# Patient Record
Sex: Female | Born: 1935
Health system: Southern US, Community
[De-identification: ages and names within clinical notes are randomized; demographics above are authoritative.]

## PROBLEM LIST (undated history)

## (undated) DIAGNOSIS — I219 Acute myocardial infarction, unspecified: Secondary | ICD-10-CM

## (undated) DIAGNOSIS — E785 Hyperlipidemia, unspecified: Secondary | ICD-10-CM

## (undated) DIAGNOSIS — E559 Vitamin D deficiency, unspecified: Secondary | ICD-10-CM

## (undated) DIAGNOSIS — I2510 Atherosclerotic heart disease of native coronary artery without angina pectoris: Secondary | ICD-10-CM

## (undated) DIAGNOSIS — I252 Old myocardial infarction: Secondary | ICD-10-CM

## (undated) DIAGNOSIS — C8590 Non-Hodgkin lymphoma, unspecified, unspecified site: Secondary | ICD-10-CM

## (undated) HISTORY — DX: Vitamin D deficiency, unspecified: E55.9

## (undated) HISTORY — DX: Acute myocardial infarction, unspecified: I21.9

## (undated) HISTORY — DX: Non-Hodgkin lymphoma, unspecified, unspecified site: C85.90

---
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
## 1998-01-16 ENCOUNTER — Ambulatory Visit (HOSPITAL_COMMUNITY): Admission: RE | Admit: 1998-01-16 | Discharge: 1998-01-16 | Payer: Self-pay

## 1998-03-07 ENCOUNTER — Ambulatory Visit (HOSPITAL_BASED_OUTPATIENT_CLINIC_OR_DEPARTMENT_OTHER): Admission: RE | Admit: 1998-03-07 | Discharge: 1998-03-07 | Payer: Self-pay | Admitting: Otolaryngology

## 1998-04-13 ENCOUNTER — Ambulatory Visit (HOSPITAL_COMMUNITY): Admission: RE | Admit: 1998-04-13 | Discharge: 1998-04-13 | Payer: Self-pay

## 1998-04-16 ENCOUNTER — Ambulatory Visit (HOSPITAL_COMMUNITY): Admission: RE | Admit: 1998-04-16 | Discharge: 1998-04-16 | Payer: Self-pay | Admitting: Hematology and Oncology

## 1999-04-16 ENCOUNTER — Ambulatory Visit (HOSPITAL_COMMUNITY): Admission: RE | Admit: 1999-04-16 | Discharge: 1999-04-16 | Payer: Self-pay | Admitting: Hematology and Oncology

## 1999-04-16 ENCOUNTER — Encounter: Payer: Self-pay | Admitting: Hematology and Oncology

## 1999-10-01 ENCOUNTER — Other Ambulatory Visit: Admission: RE | Admit: 1999-10-01 | Discharge: 1999-10-01 | Payer: Self-pay | Admitting: Internal Medicine

## 1999-10-01 ENCOUNTER — Encounter (INDEPENDENT_AMBULATORY_CARE_PROVIDER_SITE_OTHER): Payer: Self-pay

## 1999-10-15 ENCOUNTER — Encounter: Admission: RE | Admit: 1999-10-15 | Discharge: 1999-10-15 | Payer: Self-pay | Admitting: Hematology and Oncology

## 1999-10-15 ENCOUNTER — Encounter: Payer: Self-pay | Admitting: Hematology and Oncology

## 1999-12-12 ENCOUNTER — Encounter: Admission: RE | Admit: 1999-12-12 | Discharge: 1999-12-12 | Payer: Self-pay | Admitting: Hematology and Oncology

## 1999-12-12 ENCOUNTER — Encounter: Payer: Self-pay | Admitting: Hematology and Oncology

## 2000-01-17 ENCOUNTER — Emergency Department (HOSPITAL_COMMUNITY): Admission: EM | Admit: 2000-01-17 | Discharge: 2000-01-17 | Payer: Self-pay | Admitting: Emergency Medicine

## 2000-01-27 ENCOUNTER — Emergency Department (HOSPITAL_COMMUNITY): Admission: EM | Admit: 2000-01-27 | Discharge: 2000-01-27 | Payer: Self-pay | Admitting: Emergency Medicine

## 2000-03-04 ENCOUNTER — Encounter: Payer: Self-pay | Admitting: Hematology and Oncology

## 2000-03-04 ENCOUNTER — Encounter: Admission: RE | Admit: 2000-03-04 | Discharge: 2000-03-04 | Payer: Self-pay | Admitting: Hematology and Oncology

## 2000-04-17 ENCOUNTER — Ambulatory Visit (HOSPITAL_COMMUNITY): Admission: RE | Admit: 2000-04-17 | Discharge: 2000-04-17 | Payer: Self-pay

## 2000-10-28 ENCOUNTER — Encounter: Payer: Self-pay | Admitting: *Deleted

## 2000-10-28 ENCOUNTER — Emergency Department (HOSPITAL_COMMUNITY): Admission: EM | Admit: 2000-10-28 | Discharge: 2000-10-28 | Payer: Self-pay | Admitting: *Deleted

## 2001-03-02 ENCOUNTER — Encounter: Admission: RE | Admit: 2001-03-02 | Discharge: 2001-03-02 | Payer: Self-pay | Admitting: Hematology and Oncology

## 2001-03-02 ENCOUNTER — Encounter: Payer: Self-pay | Admitting: Hematology and Oncology

## 2001-05-04 ENCOUNTER — Ambulatory Visit (HOSPITAL_COMMUNITY): Admission: RE | Admit: 2001-05-04 | Discharge: 2001-05-04 | Payer: Self-pay

## 2001-06-15 ENCOUNTER — Other Ambulatory Visit: Admission: RE | Admit: 2001-06-15 | Discharge: 2001-06-15 | Payer: Self-pay | Admitting: Obstetrics and Gynecology

## 2001-07-15 ENCOUNTER — Ambulatory Visit (HOSPITAL_COMMUNITY): Admission: RE | Admit: 2001-07-15 | Discharge: 2001-07-15 | Payer: Self-pay | Admitting: Obstetrics and Gynecology

## 2001-07-15 ENCOUNTER — Encounter (INDEPENDENT_AMBULATORY_CARE_PROVIDER_SITE_OTHER): Payer: Self-pay | Admitting: Specialist

## 2001-11-10 ENCOUNTER — Ambulatory Visit (HOSPITAL_COMMUNITY): Admission: RE | Admit: 2001-11-10 | Discharge: 2001-11-10 | Payer: Self-pay

## 2002-03-15 ENCOUNTER — Ambulatory Visit (HOSPITAL_COMMUNITY): Admission: RE | Admit: 2002-03-15 | Discharge: 2002-03-15 | Payer: Self-pay | Admitting: Critical Care Medicine

## 2002-03-15 ENCOUNTER — Encounter: Payer: Self-pay | Admitting: Critical Care Medicine

## 2002-05-12 ENCOUNTER — Encounter: Payer: Self-pay | Admitting: Hematology and Oncology

## 2002-05-12 ENCOUNTER — Ambulatory Visit (HOSPITAL_COMMUNITY): Admission: RE | Admit: 2002-05-12 | Discharge: 2002-05-12 | Payer: Self-pay | Admitting: Hematology and Oncology

## 2002-07-06 ENCOUNTER — Emergency Department (HOSPITAL_COMMUNITY): Admission: EM | Admit: 2002-07-06 | Discharge: 2002-07-06 | Payer: Self-pay | Admitting: Emergency Medicine

## 2002-07-28 ENCOUNTER — Encounter: Payer: Self-pay | Admitting: Hematology & Oncology

## 2002-07-28 ENCOUNTER — Encounter: Admission: RE | Admit: 2002-07-28 | Discharge: 2002-07-28 | Payer: Self-pay | Admitting: Hematology & Oncology

## 2002-09-29 ENCOUNTER — Other Ambulatory Visit: Admission: RE | Admit: 2002-09-29 | Discharge: 2002-09-29 | Payer: Self-pay | Admitting: Obstetrics and Gynecology

## 2003-01-25 ENCOUNTER — Encounter: Payer: Self-pay | Admitting: Hematology & Oncology

## 2003-01-25 ENCOUNTER — Encounter: Admission: RE | Admit: 2003-01-25 | Discharge: 2003-01-25 | Payer: Self-pay | Admitting: Hematology & Oncology

## 2003-05-18 ENCOUNTER — Ambulatory Visit (HOSPITAL_COMMUNITY): Admission: RE | Admit: 2003-05-18 | Discharge: 2003-05-18 | Payer: Self-pay

## 2003-10-10 ENCOUNTER — Other Ambulatory Visit: Admission: RE | Admit: 2003-10-10 | Discharge: 2003-10-10 | Payer: Self-pay | Admitting: Obstetrics and Gynecology

## 2004-06-03 ENCOUNTER — Ambulatory Visit (HOSPITAL_COMMUNITY): Admission: RE | Admit: 2004-06-03 | Discharge: 2004-06-03 | Payer: Self-pay

## 2004-08-09 ENCOUNTER — Ambulatory Visit: Payer: Self-pay | Admitting: Hematology & Oncology

## 2005-01-29 ENCOUNTER — Ambulatory Visit: Payer: Self-pay | Admitting: Hematology & Oncology

## 2005-02-17 ENCOUNTER — Encounter: Admission: RE | Admit: 2005-02-17 | Discharge: 2005-02-17 | Payer: Self-pay | Admitting: Hematology & Oncology

## 2005-04-07 ENCOUNTER — Ambulatory Visit: Payer: Self-pay | Admitting: Internal Medicine

## 2005-04-14 ENCOUNTER — Ambulatory Visit (HOSPITAL_COMMUNITY): Admission: RE | Admit: 2005-04-14 | Discharge: 2005-04-14 | Payer: Self-pay | Admitting: Internal Medicine

## 2005-04-14 ENCOUNTER — Ambulatory Visit: Payer: Self-pay | Admitting: Internal Medicine

## 2005-04-14 ENCOUNTER — Encounter (INDEPENDENT_AMBULATORY_CARE_PROVIDER_SITE_OTHER): Payer: Self-pay | Admitting: Specialist

## 2005-04-15 ENCOUNTER — Ambulatory Visit: Payer: Self-pay | Admitting: Gastroenterology

## 2005-04-18 ENCOUNTER — Ambulatory Visit (HOSPITAL_COMMUNITY): Admission: RE | Admit: 2005-04-18 | Discharge: 2005-04-18 | Payer: Self-pay | Admitting: Gastroenterology

## 2005-04-18 ENCOUNTER — Encounter (INDEPENDENT_AMBULATORY_CARE_PROVIDER_SITE_OTHER): Payer: Self-pay | Admitting: Specialist

## 2005-05-01 ENCOUNTER — Ambulatory Visit: Payer: Self-pay | Admitting: Hematology & Oncology

## 2005-05-05 ENCOUNTER — Ambulatory Visit (HOSPITAL_COMMUNITY): Admission: RE | Admit: 2005-05-05 | Discharge: 2005-05-05 | Payer: Self-pay | Admitting: Hematology & Oncology

## 2005-05-13 ENCOUNTER — Ambulatory Visit: Admission: RE | Admit: 2005-05-13 | Discharge: 2005-06-20 | Payer: Self-pay | Admitting: Radiation Oncology

## 2005-07-14 ENCOUNTER — Ambulatory Visit: Payer: Self-pay | Admitting: Hematology & Oncology

## 2005-08-21 ENCOUNTER — Ambulatory Visit (HOSPITAL_COMMUNITY): Admission: RE | Admit: 2005-08-21 | Discharge: 2005-08-21 | Payer: Self-pay

## 2005-10-13 ENCOUNTER — Ambulatory Visit (HOSPITAL_COMMUNITY): Admission: RE | Admit: 2005-10-13 | Discharge: 2005-10-13 | Payer: Self-pay | Admitting: Hematology & Oncology

## 2005-10-17 ENCOUNTER — Ambulatory Visit: Payer: Self-pay | Admitting: Hematology & Oncology

## 2005-12-22 ENCOUNTER — Other Ambulatory Visit: Admission: RE | Admit: 2005-12-22 | Discharge: 2005-12-22 | Payer: Self-pay | Admitting: Obstetrics and Gynecology

## 2006-01-20 ENCOUNTER — Ambulatory Visit (HOSPITAL_COMMUNITY): Admission: RE | Admit: 2006-01-20 | Discharge: 2006-01-20 | Payer: Self-pay | Admitting: Hematology & Oncology

## 2006-01-22 ENCOUNTER — Ambulatory Visit: Payer: Self-pay | Admitting: Hematology & Oncology

## 2006-01-26 LAB — CBC WITH DIFFERENTIAL/PLATELET
BASO%: 0.6 % (ref 0.0–2.0)
Basophils Absolute: 0 10*3/uL (ref 0.0–0.1)
EOS%: 4.5 % (ref 0.0–7.0)
Eosinophils Absolute: 0.2 10*3/uL (ref 0.0–0.5)
HCT: 37.5 % (ref 34.8–46.6)
HGB: 12.9 g/dL (ref 11.6–15.9)
LYMPH%: 23.4 % (ref 14.0–48.0)
MCH: 31.6 pg (ref 26.0–34.0)
MCHC: 34.5 g/dL (ref 32.0–36.0)
MCV: 91.5 fL (ref 81.0–101.0)
MONO#: 0.5 10*3/uL (ref 0.1–0.9)
MONO%: 10.1 % (ref 0.0–13.0)
NEUT#: 3.1 10*3/uL (ref 1.5–6.5)
NEUT%: 61.4 % (ref 39.6–76.8)
Platelets: 229 10*3/uL (ref 145–400)
RBC: 4.1 10*6/uL (ref 3.70–5.32)
RDW: 13.1 % (ref 11.3–14.5)
WBC: 5.1 10*3/uL (ref 3.9–10.0)
lymph#: 1.2 10*3/uL (ref 0.9–3.3)

## 2006-01-26 LAB — LACTATE DEHYDROGENASE: LDH: 137 U/L (ref 94–250)

## 2006-03-09 ENCOUNTER — Ambulatory Visit: Payer: Self-pay | Admitting: Hematology & Oncology

## 2006-04-23 ENCOUNTER — Encounter: Admission: RE | Admit: 2006-04-23 | Discharge: 2006-04-23 | Payer: Self-pay | Admitting: Orthopaedic Surgery

## 2006-04-27 ENCOUNTER — Ambulatory Visit: Payer: Self-pay | Admitting: Hematology & Oncology

## 2006-04-27 LAB — CBC WITH DIFFERENTIAL/PLATELET
BASO%: 0.7 % (ref 0.0–2.0)
Basophils Absolute: 0 10*3/uL (ref 0.0–0.1)
EOS%: 4.5 % (ref 0.0–7.0)
Eosinophils Absolute: 0.2 10*3/uL (ref 0.0–0.5)
HCT: 34.5 % — ABNORMAL LOW (ref 34.8–46.6)
HGB: 11.9 g/dL (ref 11.6–15.9)
LYMPH%: 28.5 % (ref 14.0–48.0)
MCH: 31.5 pg (ref 26.0–34.0)
MCHC: 34.4 g/dL (ref 32.0–36.0)
MCV: 91.7 fL (ref 81.0–101.0)
MONO#: 0.4 10*3/uL (ref 0.1–0.9)
MONO%: 11.6 % (ref 0.0–13.0)
NEUT#: 2 10*3/uL (ref 1.5–6.5)
NEUT%: 54.7 % (ref 39.6–76.8)
Platelets: 207 10*3/uL (ref 145–400)
RBC: 3.77 10*6/uL (ref 3.70–5.32)
RDW: 13.6 % (ref 11.3–14.5)
WBC: 3.6 10*3/uL — ABNORMAL LOW (ref 3.9–10.0)
lymph#: 1 10*3/uL (ref 0.9–3.3)

## 2006-04-28 LAB — BETA 2 MICROGLOBULIN, SERUM: Beta-2 Microglobulin: 1.17 mg/L (ref 1.01–1.73)

## 2006-04-28 LAB — LACTATE DEHYDROGENASE: LDH: 172 U/L (ref 94–250)

## 2006-05-21 ENCOUNTER — Ambulatory Visit (HOSPITAL_COMMUNITY): Admission: RE | Admit: 2006-05-21 | Discharge: 2006-05-21 | Payer: Self-pay | Admitting: Hematology & Oncology

## 2006-06-01 ENCOUNTER — Encounter: Admission: RE | Admit: 2006-06-01 | Discharge: 2006-06-01 | Payer: Self-pay | Admitting: Neurosurgery

## 2006-08-24 ENCOUNTER — Ambulatory Visit (HOSPITAL_COMMUNITY): Admission: RE | Admit: 2006-08-24 | Discharge: 2006-08-24 | Payer: Self-pay

## 2006-08-26 ENCOUNTER — Encounter: Admission: RE | Admit: 2006-08-26 | Discharge: 2006-08-26 | Payer: Self-pay | Admitting: Orthopaedic Surgery

## 2006-10-01 ENCOUNTER — Ambulatory Visit (HOSPITAL_COMMUNITY): Admission: RE | Admit: 2006-10-01 | Discharge: 2006-10-01 | Payer: Self-pay | Admitting: Hematology & Oncology

## 2006-10-01 ENCOUNTER — Ambulatory Visit: Payer: Self-pay | Admitting: Hematology & Oncology

## 2006-10-05 LAB — CBC WITH DIFFERENTIAL/PLATELET
BASO%: 0.4 % (ref 0.0–2.0)
Basophils Absolute: 0 10*3/uL (ref 0.0–0.1)
EOS%: 2.5 % (ref 0.0–7.0)
Eosinophils Absolute: 0.1 10*3/uL (ref 0.0–0.5)
HCT: 36.1 % (ref 34.8–46.6)
HGB: 12.3 g/dL (ref 11.6–15.9)
LYMPH%: 24.1 % (ref 14.0–48.0)
MCH: 31.9 pg (ref 26.0–34.0)
MCHC: 34 g/dL (ref 32.0–36.0)
MCV: 93.7 fL (ref 81.0–101.0)
MONO#: 0.5 10*3/uL (ref 0.1–0.9)
MONO%: 11.9 % (ref 0.0–13.0)
NEUT#: 2.8 10*3/uL (ref 1.5–6.5)
NEUT%: 61.1 % (ref 39.6–76.8)
Platelets: 186 10*3/uL (ref 145–400)
RBC: 3.85 10*6/uL (ref 3.70–5.32)
RDW: 13.3 % (ref 11.3–14.5)
WBC: 4.6 10*3/uL (ref 3.9–10.0)
lymph#: 1.1 10*3/uL (ref 0.9–3.3)

## 2006-10-05 LAB — LACTATE DEHYDROGENASE: LDH: 165 U/L (ref 94–250)

## 2007-01-07 ENCOUNTER — Encounter: Admission: RE | Admit: 2007-01-07 | Discharge: 2007-01-07 | Payer: Self-pay | Admitting: Orthopaedic Surgery

## 2007-01-27 ENCOUNTER — Ambulatory Visit (HOSPITAL_COMMUNITY): Admission: RE | Admit: 2007-01-27 | Discharge: 2007-01-27 | Payer: Self-pay | Admitting: Hematology & Oncology

## 2007-01-29 ENCOUNTER — Ambulatory Visit: Payer: Self-pay | Admitting: Hematology & Oncology

## 2007-03-15 ENCOUNTER — Ambulatory Visit (HOSPITAL_COMMUNITY): Admission: RE | Admit: 2007-03-15 | Discharge: 2007-03-15 | Payer: Self-pay | Admitting: Gastroenterology

## 2007-03-15 ENCOUNTER — Encounter (INDEPENDENT_AMBULATORY_CARE_PROVIDER_SITE_OTHER): Payer: Self-pay | Admitting: Gastroenterology

## 2007-03-18 ENCOUNTER — Ambulatory Visit (HOSPITAL_COMMUNITY): Admission: RE | Admit: 2007-03-18 | Discharge: 2007-03-18 | Payer: Self-pay

## 2007-03-30 ENCOUNTER — Encounter: Admission: RE | Admit: 2007-03-30 | Discharge: 2007-03-30 | Payer: Self-pay | Admitting: Orthopaedic Surgery

## 2007-08-01 ENCOUNTER — Ambulatory Visit: Payer: Self-pay | Admitting: Hematology & Oncology

## 2007-08-04 LAB — COMPREHENSIVE METABOLIC PANEL
ALT: 11 U/L (ref 0–35)
AST: 18 U/L (ref 0–37)
Albumin: 4 g/dL (ref 3.5–5.2)
Alkaline Phosphatase: 69 U/L (ref 39–117)
BUN: 17 mg/dL (ref 6–23)
CO2: 27 mEq/L (ref 19–32)
Calcium: 9.5 mg/dL (ref 8.4–10.5)
Chloride: 107 mEq/L (ref 96–112)
Creatinine, Ser: 0.76 mg/dL (ref 0.40–1.20)
Glucose, Bld: 86 mg/dL (ref 70–99)
Potassium: 4.6 mEq/L (ref 3.5–5.3)
Sodium: 144 mEq/L (ref 135–145)
Total Bilirubin: 0.4 mg/dL (ref 0.3–1.2)
Total Protein: 6.3 g/dL (ref 6.0–8.3)

## 2007-08-04 LAB — CBC WITH DIFFERENTIAL/PLATELET
BASO%: 0.4 % (ref 0.0–2.0)
Basophils Absolute: 0 10*3/uL (ref 0.0–0.1)
EOS%: 2.4 % (ref 0.0–7.0)
Eosinophils Absolute: 0.1 10*3/uL (ref 0.0–0.5)
HCT: 35.8 % (ref 34.8–46.6)
HGB: 12.6 g/dL (ref 11.6–15.9)
LYMPH%: 23.2 % (ref 14.0–48.0)
MCH: 32.3 pg (ref 26.0–34.0)
MCHC: 35.1 g/dL (ref 32.0–36.0)
MCV: 91.9 fL (ref 81.0–101.0)
MONO#: 0.6 10*3/uL (ref 0.1–0.9)
MONO%: 13.3 % — ABNORMAL HIGH (ref 0.0–13.0)
NEUT#: 2.7 10*3/uL (ref 1.5–6.5)
NEUT%: 60.7 % (ref 39.6–76.8)
Platelets: 194 10*3/uL (ref 145–400)
RBC: 3.9 10*6/uL (ref 3.70–5.32)
RDW: 13 % (ref 11.3–14.5)
WBC: 4.5 10*3/uL (ref 3.9–10.0)
lymph#: 1 10*3/uL (ref 0.9–3.3)

## 2007-08-04 LAB — LACTATE DEHYDROGENASE: LDH: 147 U/L (ref 94–250)

## 2007-08-25 ENCOUNTER — Ambulatory Visit (HOSPITAL_COMMUNITY): Admission: RE | Admit: 2007-08-25 | Discharge: 2007-08-25 | Payer: Self-pay

## 2007-11-16 ENCOUNTER — Ambulatory Visit (HOSPITAL_COMMUNITY): Admission: RE | Admit: 2007-11-16 | Discharge: 2007-11-16 | Payer: Self-pay | Admitting: Hematology & Oncology

## 2007-12-21 ENCOUNTER — Ambulatory Visit: Payer: Self-pay | Admitting: Hematology & Oncology

## 2007-12-23 LAB — CBC WITH DIFFERENTIAL/PLATELET
BASO%: 0.9 % (ref 0.0–2.0)
Basophils Absolute: 0 10*3/uL (ref 0.0–0.1)
EOS%: 2.7 % (ref 0.0–7.0)
Eosinophils Absolute: 0.1 10*3/uL (ref 0.0–0.5)
HCT: 36 % (ref 34.8–46.6)
HGB: 12.6 g/dL (ref 11.6–15.9)
LYMPH%: 26.6 % (ref 14.0–48.0)
MCH: 31.8 pg (ref 26.0–34.0)
MCHC: 35.2 g/dL (ref 32.0–36.0)
MCV: 90.5 fL (ref 81.0–101.0)
MONO#: 0.4 10*3/uL (ref 0.1–0.9)
MONO%: 10.3 % (ref 0.0–13.0)
NEUT#: 2.4 10*3/uL (ref 1.5–6.5)
NEUT%: 59.5 % (ref 39.6–76.8)
Platelets: 208 10*3/uL (ref 145–400)
RBC: 3.98 10*6/uL (ref 3.70–5.32)
RDW: 13.4 % (ref 11.3–14.5)
WBC: 4 10*3/uL (ref 3.9–10.0)
lymph#: 1.1 10*3/uL (ref 0.9–3.3)

## 2007-12-23 LAB — COMPREHENSIVE METABOLIC PANEL
ALT: 9 U/L (ref 0–35)
AST: 14 U/L (ref 0–37)
Albumin: 4.2 g/dL (ref 3.5–5.2)
Alkaline Phosphatase: 70 U/L (ref 39–117)
BUN: 18 mg/dL (ref 6–23)
CO2: 25 mEq/L (ref 19–32)
Calcium: 8.9 mg/dL (ref 8.4–10.5)
Chloride: 107 mEq/L (ref 96–112)
Creatinine, Ser: 0.58 mg/dL (ref 0.40–1.20)
Glucose, Bld: 88 mg/dL (ref 70–99)
Potassium: 4.6 mEq/L (ref 3.5–5.3)
Sodium: 141 mEq/L (ref 135–145)
Total Bilirubin: 0.3 mg/dL (ref 0.3–1.2)
Total Protein: 6.5 g/dL (ref 6.0–8.3)

## 2007-12-23 LAB — LACTATE DEHYDROGENASE: LDH: 140 U/L (ref 94–250)

## 2008-03-20 ENCOUNTER — Encounter: Admission: RE | Admit: 2008-03-20 | Discharge: 2008-03-20 | Payer: Self-pay | Admitting: Otolaryngology

## 2008-04-17 ENCOUNTER — Ambulatory Visit (HOSPITAL_COMMUNITY): Admission: RE | Admit: 2008-04-17 | Discharge: 2008-04-17 | Payer: Self-pay

## 2008-05-31 ENCOUNTER — Ambulatory Visit: Payer: Self-pay | Admitting: Hematology & Oncology

## 2008-06-01 LAB — CBC WITH DIFFERENTIAL (CANCER CENTER ONLY)
BASO#: 0 10*3/uL (ref 0.0–0.2)
BASO%: 0.6 % (ref 0.0–2.0)
EOS%: 2.5 % (ref 0.0–7.0)
Eosinophils Absolute: 0.1 10*3/uL (ref 0.0–0.5)
HCT: 34.5 % — ABNORMAL LOW (ref 34.8–46.6)
HGB: 11.9 g/dL (ref 11.6–15.9)
LYMPH#: 1.3 10*3/uL (ref 0.9–3.3)
LYMPH%: 32.7 % (ref 14.0–48.0)
MCH: 30.7 pg (ref 26.0–34.0)
MCHC: 34.5 g/dL (ref 32.0–36.0)
MCV: 89 fL (ref 81–101)
MONO#: 0.3 10*3/uL (ref 0.1–0.9)
MONO%: 6.2 % (ref 0.0–13.0)
NEUT#: 2.4 10*3/uL (ref 1.5–6.5)
NEUT%: 58 % (ref 39.6–80.0)
Platelets: 208 10*3/uL (ref 145–400)
RBC: 3.87 10*6/uL (ref 3.70–5.32)
RDW: 11.6 % (ref 10.5–14.6)
WBC: 4.1 10*3/uL (ref 3.9–10.0)

## 2008-06-01 LAB — COMPREHENSIVE METABOLIC PANEL
ALT: 9 U/L (ref 0–35)
AST: 16 U/L (ref 0–37)
Albumin: 4.2 g/dL (ref 3.5–5.2)
Alkaline Phosphatase: 58 U/L (ref 39–117)
BUN: 12 mg/dL (ref 6–23)
CO2: 26 mEq/L (ref 19–32)
Calcium: 8.8 mg/dL (ref 8.4–10.5)
Chloride: 107 mEq/L (ref 96–112)
Creatinine, Ser: 0.63 mg/dL (ref 0.40–1.20)
Glucose, Bld: 85 mg/dL (ref 70–99)
Potassium: 3.8 mEq/L (ref 3.5–5.3)
Sodium: 140 mEq/L (ref 135–145)
Total Bilirubin: 0.6 mg/dL (ref 0.3–1.2)
Total Protein: 6.6 g/dL (ref 6.0–8.3)

## 2008-06-01 LAB — LACTATE DEHYDROGENASE: LDH: 164 U/L (ref 94–250)

## 2008-10-31 ENCOUNTER — Ambulatory Visit (HOSPITAL_COMMUNITY): Admission: RE | Admit: 2008-10-31 | Discharge: 2008-10-31 | Payer: Self-pay | Admitting: Hematology & Oncology

## 2008-11-27 ENCOUNTER — Ambulatory Visit: Payer: Self-pay | Admitting: Hematology & Oncology

## 2009-01-22 ENCOUNTER — Ambulatory Visit (HOSPITAL_COMMUNITY): Admission: RE | Admit: 2009-01-22 | Discharge: 2009-01-22 | Payer: Self-pay

## 2009-05-09 ENCOUNTER — Ambulatory Visit: Payer: Self-pay | Admitting: Hematology & Oncology

## 2009-07-09 ENCOUNTER — Ambulatory Visit: Payer: Self-pay | Admitting: Hematology & Oncology

## 2009-11-06 ENCOUNTER — Ambulatory Visit: Payer: Self-pay | Admitting: Oncology

## 2010-01-17 ENCOUNTER — Ambulatory Visit: Payer: Self-pay | Admitting: Hematology & Oncology

## 2010-01-18 LAB — COMPREHENSIVE METABOLIC PANEL
ALT: 11 U/L (ref 0–35)
AST: 18 U/L (ref 0–37)
Albumin: 4.1 g/dL (ref 3.5–5.2)
Alkaline Phosphatase: 49 U/L (ref 39–117)
BUN: 21 mg/dL (ref 6–23)
CO2: 24 mEq/L (ref 19–32)
Calcium: 8.8 mg/dL (ref 8.4–10.5)
Chloride: 105 mEq/L (ref 96–112)
Creatinine, Ser: 0.68 mg/dL (ref 0.40–1.20)
Glucose, Bld: 73 mg/dL (ref 70–99)
Potassium: 3.8 mEq/L (ref 3.5–5.3)
Sodium: 140 mEq/L (ref 135–145)
Total Bilirubin: 0.5 mg/dL (ref 0.3–1.2)
Total Protein: 6.5 g/dL (ref 6.0–8.3)

## 2010-01-18 LAB — CBC WITH DIFFERENTIAL (CANCER CENTER ONLY)
BASO#: 0 10*3/uL (ref 0.0–0.2)
BASO%: 0.6 % (ref 0.0–2.0)
EOS%: 2.8 % (ref 0.0–7.0)
Eosinophils Absolute: 0.1 10*3/uL (ref 0.0–0.5)
HCT: 37.7 % (ref 34.8–46.6)
HGB: 12.9 g/dL (ref 11.6–15.9)
LYMPH#: 1.3 10*3/uL (ref 0.9–3.3)
LYMPH%: 28.3 % (ref 14.0–48.0)
MCH: 31.7 pg (ref 26.0–34.0)
MCHC: 34.2 g/dL (ref 32.0–36.0)
MCV: 93 fL (ref 81–101)
MONO#: 0.3 10*3/uL (ref 0.1–0.9)
MONO%: 5.9 % (ref 0.0–13.0)
NEUT#: 2.8 10*3/uL (ref 1.5–6.5)
NEUT%: 62.4 % (ref 39.6–80.0)
Platelets: 186 10*3/uL (ref 145–400)
RBC: 4.07 10*6/uL (ref 3.70–5.32)
RDW: 11.4 % (ref 10.5–14.6)
WBC: 4.5 10*3/uL (ref 3.9–10.0)

## 2010-01-18 LAB — VITAMIN D 25 HYDROXY (VIT D DEFICIENCY, FRACTURES): Vit D, 25-Hydroxy: 16 ng/mL — ABNORMAL LOW (ref 30–89)

## 2010-01-18 LAB — FERRITIN: Ferritin: 67 ng/mL (ref 10–291)

## 2010-01-25 ENCOUNTER — Ambulatory Visit (HOSPITAL_COMMUNITY): Admission: RE | Admit: 2010-01-25 | Discharge: 2010-01-25 | Payer: Self-pay | Admitting: Hematology & Oncology

## 2010-02-20 LAB — CONVERTED CEMR LAB: Pap Smear: NORMAL

## 2010-05-30 ENCOUNTER — Ambulatory Visit: Payer: Self-pay | Admitting: Family Medicine

## 2010-05-30 DIAGNOSIS — Z85038 Personal history of other malignant neoplasm of large intestine: Secondary | ICD-10-CM | POA: Insufficient documentation

## 2010-05-30 DIAGNOSIS — M899 Disorder of bone, unspecified: Secondary | ICD-10-CM | POA: Insufficient documentation

## 2010-05-30 DIAGNOSIS — F4321 Adjustment disorder with depressed mood: Secondary | ICD-10-CM | POA: Insufficient documentation

## 2010-05-30 DIAGNOSIS — M949 Disorder of cartilage, unspecified: Secondary | ICD-10-CM

## 2010-05-30 DIAGNOSIS — C8590 Non-Hodgkin lymphoma, unspecified, unspecified site: Secondary | ICD-10-CM | POA: Insufficient documentation

## 2010-05-30 DIAGNOSIS — IMO0001 Reserved for inherently not codable concepts without codable children: Secondary | ICD-10-CM | POA: Insufficient documentation

## 2010-06-03 ENCOUNTER — Encounter: Payer: Self-pay | Admitting: Family Medicine

## 2010-06-03 LAB — CONVERTED CEMR LAB
ALT: 16 units/L (ref 0–35)
AST: 23 units/L (ref 0–37)
Albumin: 4.1 g/dL (ref 3.5–5.2)
Alkaline Phosphatase: 50 units/L (ref 39–117)
BUN: 18 mg/dL (ref 6–23)
Basophils Absolute: 0 10*3/uL (ref 0.0–0.1)
Basophils Relative: 0.5 % (ref 0.0–3.0)
Bilirubin, Direct: 0 mg/dL (ref 0.0–0.3)
CO2: 29 meq/L (ref 19–32)
Calcium: 9.7 mg/dL (ref 8.4–10.5)
Chloride: 102 meq/L (ref 96–112)
Cholesterol: 217 mg/dL — ABNORMAL HIGH (ref 0–200)
Creatinine, Ser: 0.6 mg/dL (ref 0.4–1.2)
Direct LDL: 156.4 mg/dL
Eosinophils Absolute: 0.1 10*3/uL (ref 0.0–0.7)
Eosinophils Relative: 1.7 % (ref 0.0–5.0)
GFR calc non Af Amer: 108.21 mL/min (ref >60)
Glucose, Bld: 85 mg/dL (ref 70–99)
HCT: 37.3 % (ref 36.0–46.0)
HDL: 43.8 mg/dL (ref >39.00)
Hemoglobin: 12.7 g/dL (ref 12.0–15.0)
Lymphocytes Relative: 24.6 % (ref 12.0–46.0)
Lymphs Abs: 1.6 10*3/uL (ref 0.7–4.0)
MCHC: 33.9 g/dL (ref 30.0–36.0)
MCV: 94.7 fL (ref 78.0–100.0)
Monocytes Absolute: 0.6 10*3/uL (ref 0.1–1.0)
Monocytes Relative: 8.6 % (ref 3.0–12.0)
Neutro Abs: 4.3 10*3/uL (ref 1.4–7.7)
Neutrophils Relative %: 64.6 % (ref 43.0–77.0)
Platelets: 189 10*3/uL (ref 150.0–400.0)
Potassium: 4.6 meq/L (ref 3.5–5.1)
RBC: 3.94 M/uL (ref 3.87–5.11)
RDW: 14.1 % (ref 11.5–14.6)
Sodium: 138 meq/L (ref 135–145)
Total Bilirubin: 0.2 mg/dL — ABNORMAL LOW (ref 0.3–1.2)
Total CHOL/HDL Ratio: 5
Total Protein: 6.6 g/dL (ref 6.0–8.3)
Triglycerides: 137 mg/dL (ref 0.0–149.0)
VLDL: 27.4 mg/dL (ref 0.0–40.0)
WBC: 6.7 10*3/uL (ref 4.5–10.5)

## 2010-08-22 ENCOUNTER — Ambulatory Visit: Payer: Self-pay | Admitting: Hematology & Oncology

## 2010-08-23 ENCOUNTER — Ambulatory Visit: Payer: Self-pay | Admitting: Family Medicine

## 2010-08-23 ENCOUNTER — Encounter: Payer: Self-pay | Admitting: Family Medicine

## 2010-08-23 DIAGNOSIS — N3946 Mixed incontinence: Secondary | ICD-10-CM | POA: Insufficient documentation

## 2010-08-23 LAB — CONVERTED CEMR LAB
Bilirubin Urine: NEGATIVE
Blood in Urine, dipstick: NEGATIVE
Glucose, Urine, Semiquant: NEGATIVE
Ketones, urine, test strip: NEGATIVE
Nitrite: NEGATIVE
Specific Gravity, Urine: 1.01
Urobilinogen, UA: 0.2
pH: 5

## 2010-09-05 ENCOUNTER — Ambulatory Visit: Payer: Self-pay | Admitting: Family Medicine

## 2010-09-16 ENCOUNTER — Ambulatory Visit
Admission: RE | Admit: 2010-09-16 | Discharge: 2010-09-16 | Payer: Self-pay | Source: Home / Self Care | Attending: Family Medicine | Admitting: Family Medicine

## 2010-09-16 DIAGNOSIS — R413 Other amnesia: Secondary | ICD-10-CM | POA: Insufficient documentation

## 2010-09-24 ENCOUNTER — Telehealth: Payer: Self-pay | Admitting: Family Medicine

## 2010-09-29 ENCOUNTER — Encounter: Payer: Self-pay | Admitting: Orthopaedic Surgery

## 2010-09-29 ENCOUNTER — Encounter: Payer: Self-pay | Admitting: Hematology & Oncology

## 2010-10-10 NOTE — Progress Notes (Signed)
Summary: motion sickness  Phone Note Call from Patient Call back at [phone]   Caller: Patient Call For: Ruthe Mannan MD Summary of Call: Patient is going on a 7 day cruise. She is asking if she can get patches for motion sickness. Uses [facility]. Initial call taken by: Melody Comas,  September 24, 2010 12:51 PM    New/Updated Medications: TRANSDERM-SCOP 1.5 MG PT72 (SCOPOLAMINE BASE) apply patch behind ear every 72 hours as needed for nausea/mtion sickness at least 4 hours prior to event. Prescriptions: TRANSDERM-SCOP 1.5 MG PT72 (SCOPOLAMINE BASE) apply patch behind ear every 72 hours as needed for nausea/mtion sickness at least 4 hours prior to event.  #5 x 0   Entered and Authorized by:   Ruthe Mannan MD   Signed by:   Ruthe Mannan MD on 09/24/2010   Method used:   Electronically to        [facility]* (retail)       [address]       [place], [place]  [address]       Ph: [phone]       Fax: [phone]   RxID:   [id number]

## 2010-10-10 NOTE — Letter (Signed)
Summary: Generic Letter  [facility]  [address] [place], [place] [address]   Phone: [phone]  Fax: [phone]    06/03/2010  [facility] [address] [place], [place]  [address]  Dear Ms. Italiano,     We have received your lab results.  Dr. Dayton Martes says that your electrolytes and blood count are normal.  Kidney and liver functions are normal as well.  Bad cholesterol (LDL) elevated, otherwise cholesterol looks good.  Decrease added sugar, eliminate trans fat, increase fiber and limit alcohol.  All these changes together can drop cholesterol by almost 50%.  Enclosed you will find a copy of your lab results.        Sincerely,        Linde Gillis CMA (AAMA)for Dr. Ruthe Mannan

## 2010-10-10 NOTE — Assessment & Plan Note (Signed)
Summary: NEW PT TO BE ESTABLISHED/[name]   Vital Signs:  Patient profile:   76 year old female Height:      59.50 inches Weight:      172 pounds BMI:     34.28 Temp:     98.2 degrees F oral Pulse rate:   80 / minute Pulse rhythm:   regular BP sitting:   132 / 80  (left arm) Cuff size:   large  Vitals Entered By: Linde Gillis CMA Duncan Dull) (May 30, 2010 2:16 PM) CC: new patient, establish care  Vision Screening:Left eye w/o correction: 20 / 25 Right Eye w/o correction: 20 / 25 Both eyes w/o correction:  20/ 15  Color vision testing: normal      Vision Entered By: Linde Gillis CMA Duncan Dull) (May 30, 2010 2:33 PM)  Hearing Screen 40db HL: Left  500 hz: 40db 1000 hz: 40db 2000 hz: 40db 4000 hz: 40db Right  500 hz: 40db 1000 hz: 40db 2000 hz: 40db 4000 hz: 40db    History of Present Illness: 76 yo here to establish care.  Awaiting medical records, per pt up to date on all immunizations and preventative services.  H/o Lymphoma- followed by Dr. Myna Hidalgo.  Diagnosed in 1999, s/p chemotherapy.  No further recurrances until had colonoscopy (approx 10 years ago), that showed rectal cancer.  Per pt, told she needed radiation treatment but she refused.  Had a follow up colonoscopy by Dr. Loreta Ave and per pt showed no signs of cancer.  Has been asymptomatic and doing well.  Follows with Dr. Myna Hidalgo yearly.  Husband died a few months ago after long illness s/p massive stroke.  She is doing well emotionally, misses him but knows he "is in a better place." Molly Chandler is extremely active, no h/o falls.  Chops her own wood, helps care for her neighbors. Denies any signs or symptoms of depression.  Leg cramps- has noticed leg cramps for the past few month, usually occur at night.  Happening multiple times per week now.  Does not seem to be worsened or improved by anything.  Current Medications (verified): 1)  Calci-Chew 1250 Mg Chew (Calcium Carbonate) .Marland Kitchen.. 1 Tab By Mouth  Daily. 2)  Vitamin D3 2000 Unit Caps (Cholecalciferol) .Marland Kitchen.. 1 Tab By Mouth Daily.  Allergies (verified): No Known Drug Allergies  Past History:  Family History: Last updated: 05/30/2010 Unremarkable- both parents died of old age.  Social History: Last updated: 05/30/2010 Widow/Widower Alcohol use-no Former smoker Retired 5 children.  Past Medical History: cataracts. Osteopenia Lymphoma, rectal cancer- followed by Dr. Myna Hidalgo  Past Surgical History: Bilateral intraocular lengs, 11/2003, 12/2003- s/p catarct removal.  Family History: Unremarkable- both parents died of old age.  Social History: Widow/Widower Alcohol use-no Former smoker Retired 5 children.  Review of Systems      See HPI General:  Denies malaise, weakness, and weight loss. Eyes:  Denies blurring. ENT:  Denies difficulty swallowing. CV:  Denies chest pain or discomfort, shortness of breath with exertion, swelling of feet, and swelling of hands. Resp:  Denies shortness of breath. GI:  Denies abdominal pain, bloody stools, and change in bowel habits. GU:  Denies discharge and dysuria. MS:  Complains of muscle aches and cramps; denies joint pain, joint redness, and joint swelling. Derm:  Denies rash. Neuro:  Denies headaches and weakness. Psych:  Denies anxiety, depression, easily angered, panic attacks, sense of great danger, suicidal thoughts/plans, thoughts of violence, unusual visions or sounds, and thoughts /plans of harming others. Endo:  Denies cold intolerance and heat intolerance.  Physical Exam  General:  alert, well-developed, and well-nourished.   Head:  normocephalic and atraumatic.   Eyes:  pupils equal and pupils round.   Ears:  R ear normal and L ear normal.   Nose:  no external deformity.   Mouth:  pharynx pink and moist.   Lungs:  Normal respiratory effort, chest expands symmetrically. Lungs are clear to auscultation, no crackles or wheezes. Heart:  Normal rate and regular  rhythm. S1 and S2 normal without gallop, murmur, click, rub or other extra sounds. Abdomen:  Bowel sounds positive,abdomen soft and non-tender without masses, organomegaly or hernias noted. Msk:  normal ROM, no joint tenderness, no joint swelling, no joint warmth, and no redness over joints.   Extremities:  no edema Neurologic:  alert & oriented X3 and gait normal.   Skin:  Intact without suspicious lesions or rashes Psych:  Cognition and judgment appear intact. Alert and cooperative with normal attention span and concentration. No apparent delusions, illusions, hallucinations   Impression & Recommendations:  Problem # 1:  MOURNING (ICD-309.0) Assessment New Appears to be handling things appropriately.  Has a great support system, remaining very active.  Problem # 2:  COLON CANCER, HX OF (ICD-V10.05) Assessment: Unchanged awaitng records from Dr. Myna Hidalgo.  Problem # 3:  LYMPHOMA (ICD-202.80) Assessment: Unchanged In remission per pt.  Awaiting records from Dr. Myna Hidalgo.  Problem # 4:  OSTEOPENIA (ICD-733.90) Assessment: Unchanged Per pt, recent DEXA scan showed improvement per pt. Her updated medication list for this problem includes:    Calci-chew 1250 Mg Chew (Calcium carbonate) .Marland Kitchen... 1 tab by mouth daily.    Vitamin D3 2000 Unit Caps (Cholecalciferol) .Marland Kitchen... 1 tab by mouth daily.  Problem # 5:  UNSPECIFIED MYALGIA AND MYOSITIS (ICD-729.1) Assessment: New Etiology unknown.  Will check BMET, hepatic panel, CBC.  PE exam normal.  No red flag symptoms. Orders: Venipuncture ([address]) TLB-BMP (Basic Metabolic Panel-BMET) (80048-METABOL) TLB-Hepatic/Liver Function Pnl (80076-HEPATIC) TLB-CBC Platelet - w/Differential (85025-CBCD)  Complete Medication List: 1)  Calci-chew 1250 Mg Chew (Calcium carbonate) .Marland Kitchen.. 1 tab by mouth daily. 2)  Vitamin D3 2000 Unit Caps (Cholecalciferol) .Marland Kitchen.. 1 tab by mouth daily.  Other Orders: TLB-Lipid Panel (80061-LIPID)  Prior Medications (reviewed  today): None Current Allergies (reviewed today): No known allergies   Prevention & Chronic Care Immunizations   Influenza vaccine: historical  (05/09/2010)   Influenza vaccine due: 05/10/2011    Tetanus booster: Not documented    Pneumococcal vaccine: Not documented    H. zoster vaccine: Not documented  Colorectal Screening   Hemoccult: Not documented    Colonoscopy: historical  (05/11/2008)   Colonoscopy due: 05/11/2018  Other Screening   Pap smear: historical normal  (02/20/2010)   Pap smear due: 02/21/2011    Mammogram: historical normal  (02/20/2010)   Mammogram due: 02/21/2011    DXA bone density scan: historical- osteopenia  (02/13/2010)   DXA scan due: None    Smoking status: Not documented  Lipids   Total Cholesterol: Not documented   Lipid panel action/deferral: Lipid Panel ordered   LDL: Not documented   LDL Direct: Not documented   HDL: Not documented   Triglycerides: Not documented   Flu Vaccine Result Date:  05/09/2010 Flu Vaccine Result:  historical Colonoscopy Result Date:  05/11/2008 Colonoscopy Result:  historical PAP Result Date:  02/20/2010 PAP Result:  historical normal Mammogram Result Date:  02/20/2010 Mammogram Result:  historical normal Bone Density Result Date:  02/13/2010 Bone Density Result:  historical- osteopenia

## 2010-10-10 NOTE — Assessment & Plan Note (Signed)
Summary: INCONTINANCE/CLE   Vital Signs:  Patient profile:   76 year old female Height:      59.50 inches Weight:      176 pounds BMI:     35.08 Temp:     98.0 degrees F oral Pulse rate:   76 / minute Pulse rhythm:   regular BP sitting:   120 / 70  (left arm) Cuff size:   large  Vitals Entered By: Linde Gillis CMA Duncan Dull) (August 23, 2010 2:46 PM) CC: incontinence   History of Present Illness: 76 yo here for worsening incontinence.  Past two months, cannot make it to the bathroom.  Has had to place a bedside commode next to her bed or she will wet her clothes. Everytime she coughs, sneezes or laughs, she leaks urine as well. G5P5, has never had any gyn surgery. No dysuria, no hematuria.   No nausea or vomiting.  Current Medications (verified): 1)  Calci-Chew 1250 Mg Chew (Calcium Carbonate) .Marland Kitchen.. 1 Tab By Mouth Daily. 2)  Vitamin D3 2000 Unit Caps (Cholecalciferol) .Marland Kitchen.. 1 Tab By Mouth Daily. 3)  Detrol La 2 Mg  Cp24 (Tolterodine Tartrate) .Marland Kitchen.. 1 Tablet By Mouth Daily  Allergies (verified): No Known Drug Allergies  Past History:  Past Surgical History: Last updated: 05/30/2010 Bilateral intraocular lengs, 11/2003, 12/2003- s/p catarct removal.  Review of Systems      See HPI General:  Denies chills and fever. GU:  Complains of incontinence and urinary frequency; denies dysuria and hematuria.  Physical Exam  General:  alert, well-developed, and well-nourished.   VSS, non toxic appearing Mouth:  MMM Abdomen:  Bowel sounds positive,abdomen soft and non-tender without masses, organomegaly or hernias noted. Extremities:  no edema Psych:  Cognition and judgment appear intact. Alert and cooperative with normal attention span and concentration. No apparent delusions, illusions, hallucinations   Impression & Recommendations:  Problem # 1:  URINARY INCONTINENCE, MIXED (ICD-788.33) Assessment New UA pos only for trace LE, this is unlikely infectious. Will send for  urine culture to verify. Will start Detrol, if no improvement of symptoms, will need complete GYN and Urological evaluation. Pt in agreement with plan. discussed side effects of Detrol. Orders: T-Culture, Urine ([id number])  Complete Medication List: 1)  Calci-chew 1250 Mg Chew (Calcium carbonate) .Marland Kitchen.. 1 tab by mouth daily. 2)  Vitamin D3 2000 Unit Caps (Cholecalciferol) .Marland Kitchen.. 1 tab by mouth daily. 3)  Detrol La 2 Mg Cp24 (Tolterodine tartrate) .Marland Kitchen.. 1 tablet by mouth daily Prescriptions: DETROL LA 2 MG  CP24 (TOLTERODINE TARTRATE) 1 tablet by mouth daily  #90 x 3   Entered and Authorized by:   Ruthe Mannan MD   Signed by:   Ruthe Mannan MD on 08/23/2010   Method used:   Electronically to        [facility]* (retail)       [address]       [place], [place]  [address]       Ph: [phone]       Fax: [phone]   RxID:   [id number]    Orders Added: 1)  T-Culture, Urine [[id number]] 2)  Est. Patient Level IV [51884]    Current Allergies (reviewed today): No known allergies   Laboratory Results   Urine Tests  Date/Time Received: August 23, 2010 2:57 PM   Routine Urinalysis   Color: yellow Appearance: Clear Glucose: negative   (Normal Range: Negative) Bilirubin: negative   (Normal Range: Negative) Ketone: negative   (Normal Range: Negative) Spec.  Gravity: 1.010   (Normal Range: 1.003-1.035) Blood: negative   (Normal Range: Negative) pH: 5.0   (Normal Range: 5.0-8.0) Protein: trace   (Normal Range: Negative) Urobilinogen: 0.2   (Normal Range: 0-1) Nitrite: negative   (Normal Range: Negative) Leukocyte Esterace: trace   (Normal Range: Negative)

## 2010-10-10 NOTE — Assessment & Plan Note (Signed)
Summary: FOLLOW-UP/[name]   Vital Signs:  Patient profile:   76 year old female Height:      59.50 inches Weight:      176 pounds BMI:     35.08 Temp:     97.7 degrees F oral Pulse rate:   71 / minute Pulse rhythm:   regular BP sitting:   130 / 80  (left arm) Cuff size:   large  Vitals Entered By: Linde Gillis CMA Duncan Dull) (September 16, 2010 7:54 AM) CC: memory concerns, 1 month follow up   History of Present Illness: 76 yo here for:  1.  Follow up incontinence- Past two months, progressive mixed incontinence.   Increased urgency. Everytime she coughs, sneezes or laughs, she leaks urine as well. G5P5, has never had any gyn surgery. No dysuria, no hematuria.   No nausea or vomiting. Started Detrol 2 mg daily last month.  She feels it has helped tremendously, no longer waking up at night to urinate.    Denies any dry mouth, constipation, or other noticible side effects.    2.  Memory issues- here with daughter.  Notices for past two months, forgets where she places things like her keys.  Has never forgotten where she was or left something on the stove. Daughter notices that she asks her the same a questions often for past two years. Father had alzheimers in his late 74s.  Current Medications (verified): 1)  Calci-Chew 1250 Mg Chew (Calcium Carbonate) .Marland Kitchen.. 1 Tab By Mouth Daily. 2)  Vitamin D3 2000 Unit Caps (Cholecalciferol) .Marland Kitchen.. 1 Tab By Mouth Daily. 3)  Detrol La 2 Mg  Cp24 (Tolterodine Tartrate) .Marland Kitchen.. 1 Tablet By Mouth Daily  Allergies (verified): No Known Drug Allergies  Past History:  Past Medical History: Last updated: 05/30/2010 cataracts. Osteopenia Lymphoma, rectal cancer- followed by Dr. Myna Hidalgo  Past Surgical History: Last updated: 05/30/2010 Bilateral intraocular lengs, 11/2003, 12/2003- s/p catarct removal.  Family History: Last updated: 05/30/2010 Unremarkable- both parents died of old age.  Social History: Last updated: 05/30/2010 Widow/Widower Alcohol  use-no Former smoker Retired 5 children.  Review of Systems      See HPI General:  Denies malaise. GU:  Complains of urinary frequency; denies dysuria and hematuria. Neuro:  Complains of memory loss; denies difficulty with concentration, disturbances in coordination, falling down, headaches, numbness, poor balance, sensation of room spinning, tremors, visual disturbances, and weakness.  Physical Exam  General:  alert, well-developed, and well-nourished.   VSS, non toxic appearing Mouth:  MMM Abdomen:  Bowel sounds positive,abdomen soft and non-tender without masses, organomegaly or hernias noted. Neurologic:  alert & oriented X3 and gait normal.   Skin:  Intact without suspicious lesions or rashes Psych:  Cognition and judgment appear intact. Alert and cooperative with normal attention span and concentration. No apparent delusions, illusions, hallucinations   Impression & Recommendations:  Problem # 1:  URINARY INCONTINENCE, MIXED (ICD-788.33) Assessment Improved Continue Detrol at current dose.    Problem # 2:  MEMORY LOSS (ICD-780.93) Assessment: New MMSE 29/30 today. Discussed with daughter and pt.  Agreed to keep a journal and monitor her memory.  Follow up in 6 months.  Complete Medication List: 1)  Calci-chew 1250 Mg Chew (Calcium carbonate) .Marland Kitchen.. 1 tab by mouth daily. 2)  Vitamin D3 2000 Unit Caps (Cholecalciferol) .Marland Kitchen.. 1 tab by mouth daily. 3)  Detrol La 2 Mg Cp24 (Tolterodine tartrate) .Marland Kitchen.. 1 tablet by mouth daily   Orders Added: 1)  Est. Patient Level IV [[address]]  Current Allergies (reviewed today): No known allergies    Geriatric Assessment:  Activities of Daily Living:    Bathing-independent    Dressing-independent    Eating-independent    Toileting-independent    Transferring-independent    Continence-independent  Instrumental Activities of Daily Living:    Transportation-independent    Meal/Food Preparation-independent    Shopping  Errands-independent    Housekeeping/Chores-independent    Money Management/Finances-independent    Medication Management-independent    Ability to Use Telephone-independent    Laundry-independent  Mental Status Exam: (value/max value)    Orientation to Time: 5/5    Orientation to Place: 5/5    Registration: 3/3    Attention/Calculation: 5/5    Recall: 2/3    Language-name 2 objects: 2/2    Language-repeat: 1/1    Language-follow 3-step command: 3/3    Language-read and follow direction: 1/1    Write a sentence: 1/1    Copy design: 1/1 MSE Total score: 29/30

## 2010-10-21 ENCOUNTER — Encounter (HOSPITAL_BASED_OUTPATIENT_CLINIC_OR_DEPARTMENT_OTHER): Payer: Medicare Other | Admitting: Hematology & Oncology

## 2010-10-21 ENCOUNTER — Other Ambulatory Visit: Payer: Self-pay | Admitting: Hematology & Oncology

## 2010-10-21 ENCOUNTER — Encounter: Payer: Self-pay | Admitting: Family Medicine

## 2010-10-21 DIAGNOSIS — Z87898 Personal history of other specified conditions: Secondary | ICD-10-CM

## 2010-10-21 DIAGNOSIS — C8291 Follicular lymphoma, unspecified, lymph nodes of head, face, and neck: Secondary | ICD-10-CM

## 2010-10-21 DIAGNOSIS — C8299 Follicular lymphoma, unspecified, extranodal and solid organ sites: Secondary | ICD-10-CM

## 2010-10-21 DIAGNOSIS — R51 Headache: Secondary | ICD-10-CM

## 2010-10-21 DIAGNOSIS — C8589 Other specified types of non-Hodgkin lymphoma, extranodal and solid organ sites: Secondary | ICD-10-CM

## 2010-10-21 LAB — CBC WITH DIFFERENTIAL (CANCER CENTER ONLY)
BASO#: 0 10*3/uL (ref 0.0–0.2)
BASO%: 0.4 % (ref 0.0–2.0)
EOS%: 1.8 % (ref 0.0–7.0)
Eosinophils Absolute: 0.1 10*3/uL (ref 0.0–0.5)
HCT: 40 % (ref 34.8–46.6)
HGB: 13.3 g/dL (ref 11.6–15.9)
LYMPH#: 1.1 10*3/uL (ref 0.9–3.3)
LYMPH%: 15.9 % (ref 14.0–48.0)
MCH: 30.8 pg (ref 26.0–34.0)
MCHC: 33.2 g/dL (ref 32.0–36.0)
MCV: 93 fL (ref 81–101)
MONO#: 0.6 10*3/uL (ref 0.1–0.9)
MONO%: 8.4 % (ref 0.0–13.0)
NEUT#: 5 10*3/uL (ref 1.5–6.5)
NEUT%: 73.5 % (ref 39.6–80.0)
Platelets: 241 10*3/uL (ref 145–400)
RBC: 4.31 10*6/uL (ref 3.70–5.32)
RDW: 11.9 % (ref 10.5–14.6)
WBC: 6.8 10*3/uL (ref 3.9–10.0)

## 2010-10-22 LAB — COMPREHENSIVE METABOLIC PANEL
ALT: 12 U/L (ref 0–35)
AST: 20 U/L (ref 0–37)
Albumin: 4.5 g/dL (ref 3.5–5.2)
Alkaline Phosphatase: 66 U/L (ref 39–117)
BUN: 22 mg/dL (ref 6–23)
CO2: 25 mEq/L (ref 19–32)
Calcium: 9.2 mg/dL (ref 8.4–10.5)
Chloride: 103 mEq/L (ref 96–112)
Creatinine, Ser: 0.63 mg/dL (ref 0.40–1.20)
Glucose, Bld: 88 mg/dL (ref 70–99)
Potassium: 4.3 mEq/L (ref 3.5–5.3)
Sodium: 138 mEq/L (ref 135–145)
Total Bilirubin: 0.4 mg/dL (ref 0.3–1.2)
Total Protein: 6.6 g/dL (ref 6.0–8.3)

## 2010-10-22 LAB — LACTATE DEHYDROGENASE: LDH: 155 U/L (ref 94–250)

## 2010-10-22 LAB — VITAMIN D 25 HYDROXY (VIT D DEFICIENCY, FRACTURES): Vit D, 25-Hydroxy: 21 ng/mL — ABNORMAL LOW (ref 30–89)

## 2010-11-05 NOTE — Letter (Signed)
Summary: [facility]  [facility]   Imported By: Lanelle Bal 10/31/2010 14:27:07  _____________________________________________________________________  External Attachment:    Type:   Image     Comment:   External Document

## 2010-12-19 ENCOUNTER — Encounter: Payer: Self-pay | Admitting: Family Medicine

## 2010-12-19 LAB — HM PAP SMEAR

## 2010-12-19 LAB — HM MAMMOGRAPHY

## 2010-12-19 LAB — HM COLONOSCOPY

## 2010-12-24 ENCOUNTER — Encounter: Payer: Self-pay | Admitting: Family Medicine

## 2010-12-24 ENCOUNTER — Ambulatory Visit (INDEPENDENT_AMBULATORY_CARE_PROVIDER_SITE_OTHER): Payer: Medicare Other | Admitting: Family Medicine

## 2010-12-24 VITALS — BP 120/80 | HR 64 | Temp 98.2°F | Ht 59.5 in | Wt 179.1 lb

## 2010-12-24 DIAGNOSIS — R252 Cramp and spasm: Secondary | ICD-10-CM

## 2010-12-24 DIAGNOSIS — Z79899 Other long term (current) drug therapy: Secondary | ICD-10-CM

## 2010-12-24 LAB — VITAMIN B12: Vitamin B-12: 435 pg/mL (ref 211–911)

## 2010-12-24 LAB — BASIC METABOLIC PANEL
BUN: 20 mg/dL (ref 6–23)
CO2: 29 mEq/L (ref 19–32)
Calcium: 9.3 mg/dL (ref 8.4–10.5)
Chloride: 105 mEq/L (ref 96–112)
Creatinine, Ser: 0.6 mg/dL (ref 0.4–1.2)
GFR: 98.21 mL/min (ref >60.00)
Glucose, Bld: 83 mg/dL (ref 70–99)
Potassium: 4.7 mEq/L (ref 3.5–5.1)
Sodium: 140 mEq/L (ref 135–145)

## 2010-12-24 LAB — TSH: TSH: 2.48 u[IU]/mL (ref 0.35–5.50)

## 2010-12-24 NOTE — Progress Notes (Signed)
History of Present Illness: 76 yo here for: Bilateral thigh cramps- Was sitting on couch 2 weeks ago and had severe bilateral thigh pain. Lasted for 10 minutes and gradually went away. Has not occurred since but it was so painful, she was in tears. No LE weakness. No SOB or chest pain. No redness or warmth. Never had anything like this before.   Was working in the yard earlier in the day and it was a hot day.  Review of Systems       See HPI General:  Denies malaise. Neuro:  denies difficulty with concentration, disturbances in coordination, falling down, headaches, numbness, poor balance, sensation of room spinning, tremors, visual disturbances, and weakness.  Physical Exam BP 120/80  Pulse 64  Temp(Src) 98.2 F (36.8 C) (Oral)  Ht 4' 11.5" (1.511 m)  Wt 179 lb 1.9 oz (81.248 kg)  BMI 35.57 kg/m2  General:  alert, well-developed, and well-nourished.   VSS, non toxic appearing Mouth:  MMM Abdomen:  Bowel sounds positive,abdomen soft and non-tender without masses, organomegaly or hernias noted. Neurologic:  alert & oriented X3 and gait normal.   DTRs normal. Skin:  Intact without suspicious lesions or rashes Psych:  Cognition and judgment appear intact. Alert and cooperative with normal attention span and concentration. No apparent delusions, illusions, hallucinations Ext:  No edema, normal pulses.

## 2010-12-24 NOTE — Assessment & Plan Note (Signed)
New.  Resolved. ?dehyrdration. Check labs today to rule out other reversible causes. Orders Placed This Encounter  Procedures  . Basic Metabolic Panel (BMET)  . TSH  . B12

## 2010-12-26 ENCOUNTER — Other Ambulatory Visit: Payer: Self-pay | Admitting: Family Medicine

## 2010-12-26 DIAGNOSIS — Z1231 Encounter for screening mammogram for malignant neoplasm of breast: Secondary | ICD-10-CM

## 2011-01-21 NOTE — Op Note (Signed)
Molly Chandler, Molly Chandler NO.:  [id number]   MEDICAL RECORD NO.:  [id number]          PATIENT TYPE:  AMB   LOCATION:  ENDO                         FACILITY:  [facility]   PHYSICIAN:  Anselmo Rod, M.D.  DATE OF BIRTH:  1935/04/18   DATE OF PROCEDURE:  03/15/2007  DATE OF DISCHARGE:                               OPERATIVE REPORT   PROCEDURE PERFORMED:  Esophagogastroduodenoscopy with biopsies x2.   ENDOSCOPIST:  Anselmo Rod MD   INSTRUMENT USED:  Pentax video panendoscope.   INDICATIONS FOR PROCEDURE:  A 76 year old white female undergoing an  EGD.  The patient has a history of nausea and guaiac-positive stools,  rule out peptic ulcer disease, esophagitis, gastritis, etc.   PREPROCEDURE PREPARATION:  Informed consent was procured from the  patient.  The patient fasted for 8 hours prior to the procedure.  Risks  and benefits of the procedure were discussed with the patient in great  detail.   PREPROCEDURE PHYSICAL:  VITAL SIGNS:  The patient had stable vital  signs.  NECK:  Supple.  CHEST:  Clear to auscultation.  S1 and S2 regular.  ABDOMEN:  Soft with normal bowel sounds.   DESCRIPTION OF PROCEDURE:  The patient was placed in the left lateral  decubitus position and sedated with 40 mcg of Fentanyl and 4 mg of  Versed given intravenously in slow incremental doses.  Once the patient  was adequately sedated and maintained on low-flow oxygen and continuous  cardiac monitoring, the Pentax video panendoscope was advanced through  the mouthpiece, over the tongue and into the esophagus under direct  vision. The entire esophagus widely patent with no evidence of ring,  stricture, masses, esophagitis or Barrett's mucosa.  Mild grade 1 distal  esophagitis was noted at the Z-line.  A small hiatal hernia was seen on  high retroflexion and mild diffuse gastritis was appreciated.  There was  some old heme in the stomach.  A nodule was biopsied from the midbody of  the  stomach, question pancreatic arrest.  There was significant bleeding  from the biopsy site and therefore no more than 2 biopsies were done.  The proximal small bowel appeared normal.  There was no outlet  obstruction.  The patient tolerated the procedure well without immediate  complications.   IMPRESSION:  1. Normal-appearing esophagus.  2. Mild inflammation in the gastroesophageal junction.  3. Small hiatal hernia.  4. Mild diffuse gastritis with old heme in the stomach.  5. Small nodule biopsied from midbody of stomach along with some      erosions in the antrum biopsied as well.  6. Normal proximal small bowel.   RECOMMENDATIONS:  1. Await pathology results.  2. Avoid all nonsteroidals including aspirin for now, especially over      the next 4 weeks.  3. Proceed with a colonoscopy at this time.  4. Outpatient followup in the next 2 weeks for further      recommendations.      Anselmo Rod, M.D.  Electronically Signed     JNM/MEDQ  D:  03/15/2007  T:  03/16/2007  Job:  [id number]   cc:   Marcelino Duster L. Vincente Poli, M.D.  Fax: [phone]

## 2011-01-21 NOTE — Op Note (Signed)
NAMEJALYNE, BRODZINSKI NO.:  [id number]   MEDICAL RECORD NO.:  [id number]          PATIENT TYPE:  AMB   LOCATION:  ENDO                         FACILITY:  [facility]   PHYSICIAN:  Anselmo Rod, M.D.  DATE OF BIRTH:  October 31, 1934   DATE OF PROCEDURE:  03/15/2007  DATE OF DISCHARGE:                               OPERATIVE REPORT   PROCEDURE PERFORMED:  Colonoscopy with cold biopsies x4.   ENDOSCOPIST:  Anselmo Rod, M.D.   INSTRUMENT USED:  Pentax video colonoscope.   INDICATIONS FOR PROCEDURE:  A 76 year old white female with a personal  history of rectal lymphoma and guaiac-positive stools undergoing a  repeat colonoscopy to rule out polyps, masses, etc.   PREPROCEDURE PREPARATION:  Informed consent was procured from the  patient.  The patient had fasted for 8 hours prior to the procedure and  prepped with a bottle of magnesium citrate and gallon of NuLYTELY the  night prior to the procedure.  Risks and benefits of the procedure  including a 10% miss rate of cancer and polyp were discussed with the  patient as well.   PREPROCEDURE PHYSICAL:  The patient had stable vital signs.  NECK:  Supple.  CHEST:  Clear to auscultation.  S1, S2 regular.  ABDOMEN:  Soft with normal bowel sounds.   DESCRIPTION OF PROCEDURE:  The patient was placed in the left lateral  decubitus position and sedated with an additional 40 mcg of Fentanyl and  4 mg of Versed given intravenously in slow incremental doses. Once the  patient was adequately sedate and maintained on low-flow oxygen and  continuous cardiac monitoring, the Pentax video colonoscope was advanced  from the rectum to the cecum.  Moderate-sized internal hemorrhoids were  seen on retroflexion.  A prominent fold was biopsied from 50 cm (cold  biopsies x4).  Mild melanosis coli and was noted throughout the colonic  mucosa.  Multiple washes were done.  There was no evidence of  diverticulosis.  The patient tolerated the  procedure well without  immediate complications.   IMPRESSION:  1. Prominent fold biopsied from 50 cm.  2. Mild melanosis coli.  3. Moderate-sized internal hemorrhoids.   RECOMMENDATIONS:  1. Await pathology results.  2. Avoid all nonsteroidals including aspirin for the next 2 weeks.  3. Outpatient follow-up in the next 2 weeks for further      recommendations.      Anselmo Rod, M.D.  Electronically Signed     JNM/MEDQ  D:  03/15/2007  T:  03/16/2007  Job:  [id number]   cc:   Marcelino Duster L. Vincente Poli, M.D.  Fax: [phone]

## 2011-01-24 NOTE — Op Note (Signed)
[facility]  Patient:    Molly Chandler, Molly Chandler Visit Number: [id number] MRN: [id number]          Service Type: DSU Location: [facility] Attending Physician:  Marcelle Overlie Dictated by:   Marcelle Overlie, M.D. Proc. Date: 07/15/01 Admit Date:  07/15/2001                             Operative Report  PREOPERATIVE DIAGNOSIS:       Postmenopausal bleeding with a thickened                               endometrial stripe.  POSTOPERATIVE DIAGNOSIS:      Postmenopausal bleeding with a thickened                               endometrial stripe with endometrial polyp.  OPERATION:                    D&C and hysteroscopy.  SURGEON:                      Marcelle Overlie, M.D.  ANESTHESIA:                   MAC with a paracervical.  DESCRIPTION OF PROCEDURE:     The patient was taken to the operating room where she was given sedation.  She was then placed in the lithotomy position. The vagina and vulva were prepped and draped in the usual sterile fashion.  In and out catheter was used to empty the bladder.  The cervix was grasped with a tenaculum and the paracervical block was performed at 5 and 7 oclock.  The internal os was then gently dilated using Pratt dilators.  The diagnostic hysteroscope was inserted into the uterus and the uterus was visualized in its entirety, and there were two endometrial polyps seen.  The hysteroscope was removed and a sharp curettage was performed of the entire endometrial cavity with retrieval of the endometrial polyps.  The diagnostic hysteroscope was then introduced again into the uterus and no further polyps were then seen after that.  All instruments were removed from the vagina, and all sponge, lap, and instrument counts were correct x 2.  The patient tolerated this procedure well and went to the recovery room in stable condition.  PATHOLOGY:                    Uterine curetting.  DRAINS:                       None. Dictated  by:   Marcelle Overlie, M.D. Attending Physician:  Marcelle Overlie DD:  07/15/01 TD:  07/17/01 Job: [id number]

## 2011-01-27 ENCOUNTER — Ambulatory Visit: Payer: Medicare Other | Admitting: Family Medicine

## 2011-01-29 ENCOUNTER — Ambulatory Visit (HOSPITAL_COMMUNITY)
Admission: RE | Admit: 2011-01-29 | Discharge: 2011-01-29 | Disposition: A | Discharge status: Home / Self Care | Payer: Medicare Other | Source: Ambulatory Visit | Attending: Family Medicine | Admitting: Family Medicine

## 2011-01-29 DIAGNOSIS — Z1231 Encounter for screening mammogram for malignant neoplasm of breast: Secondary | ICD-10-CM | POA: Insufficient documentation

## 2011-01-30 ENCOUNTER — Encounter: Payer: Self-pay | Admitting: *Deleted

## 2011-05-15 ENCOUNTER — Encounter: Payer: Self-pay | Admitting: Family Medicine

## 2011-05-15 ENCOUNTER — Ambulatory Visit (INDEPENDENT_AMBULATORY_CARE_PROVIDER_SITE_OTHER): Payer: Medicare Other | Admitting: Family Medicine

## 2011-05-15 DIAGNOSIS — J069 Acute upper respiratory infection, unspecified: Secondary | ICD-10-CM

## 2011-05-15 MED ORDER — BENZONATATE 200 MG PO CAPS: 200.0000 mg | ORAL_CAPSULE | Freq: Three times a day (TID) | ORAL | Status: AC | PRN

## 2011-05-15 NOTE — Progress Notes (Signed)
Both daughter have been sick per patient.  Pt has been sick since last week.  Scratchy throat.  Cough and nasal congestion.  No fevers.  No ear pain.  Post nasal gtt.  Side is sore from coughing.  If she could control the cough, she'd feel better.  Some diffuse aches.  Was started on amoxil per gyn last week but got sick on her stomach and couldn't tolerate it.  She has some codeine cough syrup at home but hasn't used it yet.    Meds, vitals, and allergies reviewed.   ROS: See HPI.  Otherwise, noncontributory.  GEN: nad, alert and oriented HEENT: mucous membranes moist, tm w/o erythema, nasal exam w/mild erythema, clear discharge noted,  OP with cobblestoning but no exudates.  Max and frontal sinuses not ttp B NECK: supple w/o LA CV: rrr.   PULM: ctab, no inc wob EXT: no edema SKIN: no acute rash

## 2011-05-15 NOTE — Patient Instructions (Signed)
I think you have a virus that will gradually improve.  Drink plenty of fluids, take tessalon as needed during the day and the cough syrup as needed at night.  Gargle with warm salt water for your throat as needed.  This should gradually improve.  Take care.  Let us know if you have other concerns.

## 2011-05-15 NOTE — Assessment & Plan Note (Signed)
She is nontoxic with a benign exam.  Likely viral process, prevalent in community.  Lungs CTAB and okay for outpatient f/u.  Tessalon prn in the day and cough syrup at night.  She agrees with plan.  This is not likely to improve with abx.  D/w pt and she understood.

## 2011-07-22 ENCOUNTER — Ambulatory Visit (INDEPENDENT_AMBULATORY_CARE_PROVIDER_SITE_OTHER)
Admission: RE | Admit: 2011-07-22 | Discharge: 2011-07-22 | Disposition: A | Discharge status: Home / Self Care | Payer: Medicare Other | Source: Ambulatory Visit | Attending: Family Medicine | Admitting: Family Medicine

## 2011-07-22 ENCOUNTER — Ambulatory Visit (INDEPENDENT_AMBULATORY_CARE_PROVIDER_SITE_OTHER): Payer: Medicare Other | Admitting: Family Medicine

## 2011-07-22 ENCOUNTER — Encounter: Payer: Self-pay | Admitting: Family Medicine

## 2011-07-22 VITALS — BP 124/82 | HR 80 | Temp 98.2°F | Wt 180.8 lb

## 2011-07-22 DIAGNOSIS — IMO0002 Reserved for concepts with insufficient information to code with codable children: Secondary | ICD-10-CM

## 2011-07-22 DIAGNOSIS — M5416 Radiculopathy, lumbar region: Secondary | ICD-10-CM

## 2011-07-22 MED ORDER — NAPROXEN 500 MG PO TABS: ORAL_TABLET | ORAL | Status: DC

## 2011-07-22 NOTE — Progress Notes (Signed)
Subjective:    Patient ID: Molly Chandler, female    DOB: Jul 04, 1935, 76 y.o.   MRN: [id number]  HPI CC: fall  76yo with h/o osteopenia presents after fall which happened 11d ago.  Pulling laundry basket walking backwards tripped over rug when walking outside and fell flat on back.  No pain at that time.  Later on in week started having back pain.  Has used heat pad and icy hot which helped lower back pain but continued pain down right leg down to heel.  Also using ibuprofen 400mg  bid, aspirin.  continues to hurt with shooting pain down back side of right leg down past ankle.  Denies fevers/chills, weakness, numbness/tingling, bowel/bladder accidents.  Has been told has spinal stenosis.  Had some surgery done on left leg, pt unsure what it was or what for, states continued pain in left leg so didn't have right one done.  Past Medical History  Diagnosis Date  . Cataract   . Osteoporosis     osteopenia  . Lymphoma     rectal cancer   No past surgical history on file.  Review of Systems Per HPI    Objective:   Physical Exam  Nursing note and vitals reviewed. Constitutional: She appears well-developed and well-nourished. No distress.  Musculoskeletal:       Minimal midline lumbar spine tenderness. No paraspinous mm tenderness. + SIJ bilaterally tender as well as R>L sciatic notch, mild tenderness R GTB. No pain with int/ext rotation at hip. Neg SLR bilaterally. Able to heel and toe walk.  Neurological: She has normal strength. No sensory deficit. Coordination normal.  Reflex Scores:      Patellar reflexes are 2+ on the right side and 2+ on the left side.      Achilles reflexes are 2+ on the right side and 2+ on the left side. Skin: Skin is warm and dry. No rash noted.  Psychiatric: She has a normal mood and affect.      Assessment & Plan:

## 2011-07-22 NOTE — Patient Instructions (Addendum)
xrays of back today to rule out fracture. I think you have sciatica which can be from irritation of nerve from soft tissue swelling after fall.  Hand out provided for stretching exercises.  Should get better with time. Start taking naprosyn 500mg  twice daily with food regularly for 1 wk then as needed.  dont' take with ibuprofen.   Continue ice/heat to back.  Do stretching exercises provided. Update [place] if not improving as expected, or any weakness or numbness of leg or other concerns.

## 2011-07-22 NOTE — Assessment & Plan Note (Addendum)
Xray obtained 2/2 h/o lymphoma and osteopenia per chart, negative for fracture or other acute pathology. Anticipate right sided sciatica. Treat conservatively with NSAID, ice/heat, rest, provided with stretching exercises from [facility] pt advisor. States has not done well with steroids in past so avoid. Update [place] if not improving as expected.

## 2011-07-23 ENCOUNTER — Telehealth: Payer: Self-pay | Admitting: *Deleted

## 2011-07-23 MED ORDER — HYDROCODONE-ACETAMINOPHEN 5-500 MG PO TABS: 1.0000 | ORAL_TABLET | Freq: Four times a day (QID) | ORAL | Status: DC | PRN

## 2011-07-23 NOTE — Telephone Encounter (Signed)
i want her to continue using naprosyn twice daily for the next week, use vicodin only for breakthrough pain.  Ok to phone in. If weakness, numbness or not improving as expected, to return for further evaluation.

## 2011-07-23 NOTE — Telephone Encounter (Signed)
Pt states that the naproxen she was given for leg pain isn't working at all.  She says she can take hydrocodone, but doesn't want to, but will.  She will need some called in.  [facility].

## 2011-07-24 NOTE — Telephone Encounter (Signed)
Patient notified. She will come in for recheck if no improvement after 1 more week or if she develops weakness or numbness.

## 2011-08-01 ENCOUNTER — Encounter: Payer: Self-pay | Admitting: Family Medicine

## 2011-08-01 ENCOUNTER — Ambulatory Visit (INDEPENDENT_AMBULATORY_CARE_PROVIDER_SITE_OTHER): Payer: Medicare Other | Admitting: Family Medicine

## 2011-08-01 VITALS — BP 114/72 | HR 68 | Temp 98.2°F | Wt 180.0 lb

## 2011-08-01 DIAGNOSIS — M791 Myalgia, unspecified site: Secondary | ICD-10-CM

## 2011-08-01 DIAGNOSIS — R252 Cramp and spasm: Secondary | ICD-10-CM

## 2011-08-01 DIAGNOSIS — IMO0001 Reserved for inherently not codable concepts without codable children: Secondary | ICD-10-CM

## 2011-08-01 LAB — BASIC METABOLIC PANEL
BUN: 15 mg/dL (ref 6–23)
CO2: 26 mEq/L (ref 19–32)
Calcium: 9.1 mg/dL (ref 8.4–10.5)
Chloride: 105 mEq/L (ref 96–112)
Creat: 0.66 mg/dL (ref 0.50–1.10)
Glucose, Bld: 82 mg/dL (ref 70–99)
Potassium: 4.3 mEq/L (ref 3.5–5.3)
Sodium: 139 mEq/L (ref 135–145)

## 2011-08-01 LAB — CK: Total CK: 116 U/L (ref 7–177)

## 2011-08-01 NOTE — Progress Notes (Signed)
B medial thigh pain.  "It's worse than a cramp."  She's had calf cramps before.  "It's like something's grabbed the legs."  Similar episodes prev, but irregular pattern of onset.  She had vein stripping prev.  No trauma immediately before the cramping. Had been seen for radicular sx, but that felt different.  Those sx continue.  She hadn't started the hydrocodone yet.    Meds, vitals, and allergies reviewed.   ROS: See HPI.  Otherwise, noncontributory.  nad ncat Mmm rrr Chaperoned exam w/o rash on the thighs B.  No muscle weakness noted but pain on palpation of the quads and adductor groups B.  No bruising No midline back tenderness Gait symmetric w/o limp or weakness noted

## 2011-08-01 NOTE — Patient Instructions (Signed)
I would try to stretch your legs gently and use a heating pad on your legs.  You can get your results through our phone system.  Follow the instructions on the blue card.  I wouldn't use muscle relaxer medicines now with your medicine allergies.  If it keeps going on, let us know.

## 2011-08-03 ENCOUNTER — Encounter: Payer: Self-pay | Admitting: Family Medicine

## 2011-08-03 NOTE — Assessment & Plan Note (Signed)
Unclear source, check CK, K and then f/u with PMD if not improved with heat and stretching.

## 2011-08-30 ENCOUNTER — Emergency Department: Payer: Self-pay | Admitting: *Deleted

## 2011-09-26 ENCOUNTER — Other Ambulatory Visit: Payer: Self-pay | Admitting: *Deleted

## 2011-09-26 ENCOUNTER — Encounter: Payer: Self-pay | Admitting: Family Medicine

## 2011-09-26 ENCOUNTER — Ambulatory Visit (INDEPENDENT_AMBULATORY_CARE_PROVIDER_SITE_OTHER): Payer: Medicare Other | Admitting: Family Medicine

## 2011-09-26 VITALS — BP 112/70 | HR 70 | Temp 98.3°F | Wt 178.0 lb

## 2011-09-26 DIAGNOSIS — Z23 Encounter for immunization: Secondary | ICD-10-CM

## 2011-09-26 DIAGNOSIS — K219 Gastro-esophageal reflux disease without esophagitis: Secondary | ICD-10-CM

## 2011-09-26 HISTORY — DX: Gastro-esophageal reflux disease without esophagitis: K21.9

## 2011-09-26 MED ORDER — TOLTERODINE TARTRATE ER 2 MG PO CP24: 2.0000 mg | ORAL_CAPSULE | Freq: Every day | ORAL | Status: DC

## 2011-09-26 NOTE — Progress Notes (Signed)
SUBJECTIVE: Molly Chandler is a 77 y.o. female who complains of GERD type symptoms. She has been experiencing belching and eructation, heartburn, bilious reflux for many minute(s), intermittent over time. ROS: patient denies abdominal pain, chest pain, cough, wheezing, dysphagia, black stools or hematemesis. Social history: no or minimal alcohol.  H/o Lymphoma- followed by Dr. Myna Hidalgo. Diagnosed in 1999, s/p chemotherapy. No further recurrances until had colonoscopy (approx 10 years ago), that showed rectal cancer. Per pt, told she needed radiation treatment but she refused. Had a follow up colonoscopy by Dr. Loreta Ave and per pt showed no signs of cancer. Has been asymptomatic and doing well. Follows with Dr. Myna Hidalgo yearly.   Current Outpatient Prescriptions  Medication Sig Dispense Refill  . acetaminophen (TYLENOL) 325 MG tablet Take 650 mg by mouth every 6 (six) hours as needed.        . calcium carbonate (OS-CAL) 1250 MG chewable tablet Chew 1 tablet by mouth daily.        . Cholecalciferol (VITAMIN D3) 2000 UNITS capsule Take 2,000 Units by mouth daily.        Marland Kitchen HYDROcodone-acetaminophen (VICODIN) 5-500 MG per tablet Take 1 tablet by mouth every 6 (six) hours as needed for pain.  30 tablet  0  . Multiple Vitamin (MULTIVITAMIN) tablet Take 1 tablet by mouth daily.        . naproxen (NAPROSYN) 500 MG tablet Take one po bid x 1 week then prn pain, take with food  60 tablet  0  . tolterodine (DETROL LA) 2 MG 24 hr capsule Take 2 mg by mouth daily.        . Travoprost, BAK Free, (TRAVATAMN) 0.004 % SOLN ophthalmic solution Place 1 drop into both eyes at bedtime.          OBJECTIVE: BP 112/70  Pulse 70  Temp(Src) 98.3 F (36.8 C) (Oral)  Wt 178 lb (80.74 kg)  Appears well, alert and oriented x 3, pleasant cooperative in NAD. Anicteric. Vitals as noted. Neck free of lymphadenopathy or mass. Abdomen - abdomen is soft without significant tenderness, masses, organomegaly or  guarding..  ASSESSMENT: GERD   PLAN: The pathophysiology of reflux is discussed.  Anti-reflux measures such as raising the head of the bed, avoiding tight clothing or belts, avoiding eating late at night and not lying down shortly after mealtime and achieving weight loss are discussed. Avoid ASA, NSAID's, caffeine, peppermints, alcohol and tobacco. OTC H2 blockers and/or antacids are often very helpful for PRN use. However, for persisting chronic or daily symptoms, prescription strength H2 blockers or a trial of PPI's are often used. Further recommendations to her: Rx for H2-blocker is written. She should alert me if there are persistent symptoms, dysphagia, weight loss or GI bleeding. FUV is scheduled.

## 2011-09-26 NOTE — Patient Instructions (Addendum)
You likely have reflux: Try raising the head of the bed, avoiding tight clothing or belts, avoiding eating late at night and not lying down shortly after mealtime.  Avoid Aspirin, Ibuprofen, caffeine, peppermints, alcohol and tobacco. You can try pepcid or tagemet otc.

## 2011-11-13 ENCOUNTER — Ambulatory Visit (INDEPENDENT_AMBULATORY_CARE_PROVIDER_SITE_OTHER)
Admission: RE | Admit: 2011-11-13 | Discharge: 2011-11-13 | Disposition: A | Discharge status: Home / Self Care | Payer: Medicare Other | Source: Ambulatory Visit | Attending: Family Medicine | Admitting: Family Medicine

## 2011-11-13 ENCOUNTER — Encounter: Payer: Self-pay | Admitting: Family Medicine

## 2011-11-13 ENCOUNTER — Ambulatory Visit (INDEPENDENT_AMBULATORY_CARE_PROVIDER_SITE_OTHER): Payer: Medicare Other | Admitting: Family Medicine

## 2011-11-13 VITALS — BP 130/70 | HR 70 | Temp 98.1°F | Wt 182.0 lb

## 2011-11-13 DIAGNOSIS — M79601 Pain in right arm: Secondary | ICD-10-CM | POA: Insufficient documentation

## 2011-11-13 DIAGNOSIS — M25562 Pain in left knee: Secondary | ICD-10-CM | POA: Insufficient documentation

## 2011-11-13 DIAGNOSIS — M79609 Pain in unspecified limb: Secondary | ICD-10-CM

## 2011-11-13 DIAGNOSIS — M25569 Pain in unspecified knee: Secondary | ICD-10-CM

## 2011-11-13 NOTE — Progress Notes (Signed)
Subjective:    Patient ID: Molly Chandler, female    DOB: Jan 30, 1935, 77 y.o.   MRN: [id number]  HPI  SUBJECTIVE: Molly Chandler is a 77 y.o. female who sustained a right shoulder injury and left knee injury 6 day(s) ago. Mechanism of injury: tripped while she was carrying wood in her driveway-landed on her right shoulder and left knee. Immediate symptoms: immediate pain, no deformity was noted by the patient other than bruising. Symptoms have been insidious since that time.   Taking Ibuprofen with some relief of pain.  Patient Active Problem List  Diagnoses  . LYMPHOMA  . MOURNING  . UNSPECIFIED MYALGIA AND MYOSITIS  . OSTEOPENIA  . COLON CANCER, HX OF  . MEMORY LOSS  . URINARY INCONTINENCE, MIXED  . Leg cramps  . Right lumbar radiculopathy  . GERD (gastroesophageal reflux disease)  . Left knee pain  . Right arm pain   Past Medical History  Diagnosis Date  . Cataract   . Osteoporosis     osteopenia  . Lymphoma     rectal cancer   No past surgical history on file. History  Substance Use Topics  . Smoking status: Former Games developer  . Smokeless tobacco: Not on file  . Alcohol Use: No   No family history on file. Allergies  Allergen Reactions  . Amoxicillin     GI intolerance.   . Reglan    Current Outpatient Prescriptions on File Prior to Visit  Medication Sig Dispense Refill  . acetaminophen (TYLENOL) 325 MG tablet Take 650 mg by mouth every 6 (six) hours as needed.        . calcium carbonate (OS-CAL) 1250 MG chewable tablet Chew 1 tablet by mouth daily.        . Cholecalciferol (VITAMIN D3) 2000 UNITS capsule Take 2,000 Units by mouth daily.        . Multiple Vitamin (MULTIVITAMIN) tablet Take 1 tablet by mouth daily.        Marland Kitchen tolterodine (DETROL LA) 2 MG 24 hr capsule Take 1 capsule (2 mg total) by mouth daily.  90 capsule  3  . Travoprost, BAK Free, (TRAVATAMN) 0.004 % SOLN ophthalmic solution Place 1 drop into both eyes at bedtime.         The PMH, PSH,  Social History, Family History, Medications, and allergies have been reviewed in [facility], and have been updated if relevant.          Review of Systems See HPI    Objective:   Physical Exam BP 130/70  Pulse 70  Temp(Src) 98.1 F (36.7 C) (Oral)  Wt 182 lb (82.555 kg) Vital signs as noted above. Appearance: alert, well appearing, and in no distress and oriented to person, place, and time. Shoulder exam: ecchymosis of right arm, positive arch sign and empty can Left knee- limited due to body habitis, neg ant drawer, positive echymosis and possible effusion X-ray: ordered, but results not yet available.        ASSESSMENT/PLAN: Shoulder- I am concerned for possible rotator cuff tear.  Pt has orthopedist, advised making immediate appt.  Will order shoulder films as well to rule out other possible traumatic injury.  Knee- difficult to assess due to body habitus.  Will get knee films but advised ortho follow up as well to rule out meniscal/ligamentous injury. The patient indicates understanding of these issues and agrees with the plan.

## 2011-11-13 NOTE — Patient Instructions (Signed)
Good to see you. I will call you with xray results later today. Go ahead and call Dr. Blenda Bridegroom office and schedule appt.

## 2011-12-29 ENCOUNTER — Other Ambulatory Visit: Payer: Self-pay | Admitting: Family Medicine

## 2011-12-29 ENCOUNTER — Other Ambulatory Visit (HOSPITAL_COMMUNITY): Payer: Self-pay | Admitting: Family Medicine

## 2011-12-29 DIAGNOSIS — Z1231 Encounter for screening mammogram for malignant neoplasm of breast: Secondary | ICD-10-CM

## 2012-02-06 ENCOUNTER — Ambulatory Visit (HOSPITAL_COMMUNITY): Payer: Medicare Other

## 2012-03-01 ENCOUNTER — Ambulatory Visit (HOSPITAL_COMMUNITY): Payer: Medicare Other

## 2012-03-16 ENCOUNTER — Ambulatory Visit (HOSPITAL_COMMUNITY): Payer: Medicare Other

## 2012-03-16 ENCOUNTER — Ambulatory Visit (HOSPITAL_COMMUNITY)
Admission: RE | Admit: 2012-03-16 | Discharge: 2012-03-16 | Disposition: A | Discharge status: Home / Self Care | Payer: Medicare Other | Source: Ambulatory Visit | Attending: Family Medicine | Admitting: Family Medicine

## 2012-03-16 DIAGNOSIS — Z1231 Encounter for screening mammogram for malignant neoplasm of breast: Secondary | ICD-10-CM | POA: Insufficient documentation

## 2012-03-18 ENCOUNTER — Encounter: Payer: Self-pay | Admitting: Family Medicine

## 2012-03-18 ENCOUNTER — Ambulatory Visit (INDEPENDENT_AMBULATORY_CARE_PROVIDER_SITE_OTHER): Payer: Medicare Other | Admitting: Family Medicine

## 2012-03-18 VITALS — BP 130/70 | HR 60 | Temp 98.2°F | Wt 181.0 lb

## 2012-03-18 DIAGNOSIS — N390 Urinary tract infection, site not specified: Secondary | ICD-10-CM

## 2012-03-18 LAB — POCT URINALYSIS DIPSTICK: Spec Grav, UA: 1.01

## 2012-03-18 MED ORDER — CIPROFLOXACIN HCL 500 MG PO TABS: 500.0000 mg | ORAL_TABLET | Freq: Two times a day (BID) | ORAL | Status: AC

## 2012-03-18 NOTE — Progress Notes (Signed)
SUBJECTIVE: Molly Chandler is a 77 y.o. female who complains of urinary frequency, urgency and dysuria x 7 days, without flank pain, fever, chills, or abnormal vaginal discharge or bleeding.   Taking Azo  Patient Active Problem List  Diagnosis  . LYMPHOMA  . MOURNING  . UNSPECIFIED MYALGIA AND MYOSITIS  . OSTEOPENIA  . COLON CANCER, HX OF  . MEMORY LOSS  . URINARY INCONTINENCE, MIXED  . Leg cramps  . Right lumbar radiculopathy  . GERD (gastroesophageal reflux disease)  . Left knee pain  . Right arm pain   Past Medical History  Diagnosis Date  . Cataract   . Osteoporosis     osteopenia  . Lymphoma     rectal cancer   No past surgical history on file. History  Substance Use Topics  . Smoking status: Former Games developer  . Smokeless tobacco: Not on file  . Alcohol Use: No   No family history on file. Allergies  Allergen Reactions  . Amoxicillin     GI intolerance.   . Metoclopramide Hcl    Current Outpatient Prescriptions on File Prior to Visit  Medication Sig Dispense Refill  . acetaminophen (TYLENOL) 325 MG tablet Take 650 mg by mouth every 6 (six) hours as needed.        . calcium carbonate (OS-CAL) 1250 MG chewable tablet Chew 1 tablet by mouth daily.        . Cholecalciferol (VITAMIN D3) 2000 UNITS capsule Take 2,000 Units by mouth daily.        . Multiple Vitamin (MULTIVITAMIN) tablet Take 1 tablet by mouth daily.        . Travoprost, BAK Free, (TRAVATAMN) 0.004 % SOLN ophthalmic solution Place 1 drop into both eyes at bedtime.         The PMH, PSH, Social History, Family History, Medications, and allergies have been reviewed in [facility], and have been updated if relevant.  OBJECTIVE:  BP 130/70  Pulse 60  Temp 98.2 F (36.8 C)  Wt 181 lb (82.101 kg)  Appears well, in no apparent distress.  Vital signs are normal. The abdomen is soft without tenderness, guarding, mass, rebound or organomegaly. No CVA tenderness or inguinal adenopathy noted. Urine dipstick shows  blood only- unable to read due to AZO use.   ASSESSMENT: UTI uncomplicated without evidence of pyelonephritis  PLAN: Treatment per orders - cipro 500 mg twice daily x 3 days, also push fluids, may use Pyridium OTC prn. Call or return to clinic prn if these symptoms worsen or fail to improve as anticipated.

## 2012-03-18 NOTE — Patient Instructions (Addendum)
Good to see you. Start taking cipro 500 mg twice daily for 3 days. Continue AZO. Keep drinking lots of fluid.

## 2012-03-18 NOTE — Addendum Note (Signed)
Addended by: Eliezer Bottom on: 03/18/2012 12:24 PM   Modules accepted: Orders

## 2012-03-19 ENCOUNTER — Encounter: Payer: Self-pay | Admitting: *Deleted

## 2012-03-19 ENCOUNTER — Encounter: Payer: Self-pay | Admitting: Family Medicine

## 2012-03-19 LAB — HM MAMMOGRAPHY: HM Mammogram: NORMAL

## 2012-03-21 LAB — URINE CULTURE: Colony Count: >=100000

## 2012-04-01 ENCOUNTER — Encounter: Payer: Self-pay | Admitting: Family Medicine

## 2012-04-01 ENCOUNTER — Other Ambulatory Visit (HOSPITAL_COMMUNITY)
Admission: RE | Admit: 2012-04-01 | Discharge: 2012-04-01 | Disposition: A | Discharge status: Home / Self Care | Payer: Medicare Other | Source: Ambulatory Visit | Attending: Family Medicine | Admitting: Family Medicine

## 2012-04-01 ENCOUNTER — Telehealth: Payer: Self-pay

## 2012-04-01 ENCOUNTER — Ambulatory Visit (INDEPENDENT_AMBULATORY_CARE_PROVIDER_SITE_OTHER): Payer: Medicare Other | Admitting: Family Medicine

## 2012-04-01 VITALS — BP 132/78 | HR 76 | Temp 97.9°F | Wt 181.0 lb

## 2012-04-01 DIAGNOSIS — Z124 Encounter for screening for malignant neoplasm of cervix: Secondary | ICD-10-CM

## 2012-04-01 DIAGNOSIS — Z1151 Encounter for screening for human papillomavirus (HPV): Secondary | ICD-10-CM | POA: Insufficient documentation

## 2012-04-01 DIAGNOSIS — M4800 Spinal stenosis, site unspecified: Secondary | ICD-10-CM

## 2012-04-01 DIAGNOSIS — Z780 Asymptomatic menopausal state: Secondary | ICD-10-CM

## 2012-04-01 MED ORDER — DICLOFENAC SODIUM 1 % TD GEL: 1.0000 "application " | Freq: Four times a day (QID) | TRANSDERMAL | Status: DC

## 2012-04-01 NOTE — Progress Notes (Signed)
Subjective:    Patient ID: Molly Chandler, female    DOB: 1934/11/09, 77 y.o.   MRN: [id number]  HPI CC: fall  77 yo with h/o osteopenia and spinal stenosis presents with worsening low back pain and bilateral leg pain.  She has shooting pain down back side of both legs down past ankle.  Denies fevers/chills, weakness, numbness/tingling, bowel/bladder accidents.  Has spinal stenosis.  Had some surgery done on left leg, pt unsure what it was or what for, states continued pain in left leg so didn't have right one done.  She would also like Pap smear done today- aware that recommendations state she does not need one but she would like to have it done. Not having any vaginal complaints.    Past Medical History  Diagnosis Date  . Cataract   . Osteoporosis     osteopenia  . Lymphoma     rectal cancer   No past surgical history on file.  Review of Systems Per HPI    Objective:   Physical Exam  Nursing note and vitals reviewed. Constitutional: She appears well-developed and well-nourished. No distress.  Musculoskeletal:       Minimal midline lumbar spine tenderness. No paraspinous mm tenderness. Neg SLR bilaterally. Able to heel and toe walk.  Genitalia:  Pelvic Exam:        External: normal female genitalia without lesions or masses        Vagina: normal without lesions or masses        Cervix: normal without lesions or masses        Adnexa: normal bimanual exam without masses or fullness        Uterus: normal by palpation        Pap smear: performed  Skin: Skin is warm and dry. No rash noted.  Psychiatric: She has a normal mood and affect.      Assessment & Plan:   1. Post-menopausal  Set up for bone density- h/o osteoporosis. DG Bone Density  2. Spinal stenosis  Now with low back pain with radiculopathy. MRI of lumbar spine for further evaluation. Will likely need referral back to neurosurgeon. The patient indicates understanding of these issues and agrees with  the plan.  MR Lumbar Spine Wo Contrast

## 2012-04-01 NOTE — Patient Instructions (Addendum)
Good to see you. Please stop by to see Shirlee Limerick on your way out to set up your MRI and Bone Density Scan.

## 2012-04-01 NOTE — Telephone Encounter (Signed)
Pt seen today; pt request Voltaren gel sent to [facility] pharmacy for leg pain. Pt has never used before but someone told pt it would help pain.Please advise.

## 2012-04-01 NOTE — Telephone Encounter (Signed)
Rx sent 

## 2012-04-01 NOTE — Addendum Note (Signed)
Addended by: Eliezer Bottom on: 04/01/2012 10:51 AM   Modules accepted: Orders

## 2012-04-01 NOTE — Telephone Encounter (Signed)
Left message advising patient script has been sent to pharmacy.

## 2012-04-06 ENCOUNTER — Other Ambulatory Visit: Payer: Self-pay | Admitting: *Deleted

## 2012-04-06 NOTE — Telephone Encounter (Signed)
Received faxed refill request from pharmacy. Spoke to pharmacist at [facility] and was advised that patient has requested this again since it was last filled on 04/01/12. Is it okay to refill medication?

## 2012-04-07 ENCOUNTER — Encounter: Payer: Self-pay | Admitting: Family Medicine

## 2012-04-07 ENCOUNTER — Encounter: Payer: Self-pay | Admitting: *Deleted

## 2012-04-07 LAB — HM PAP SMEAR: HM Pap smear: NORMAL

## 2012-04-08 ENCOUNTER — Ambulatory Visit (HOSPITAL_COMMUNITY)
Admission: RE | Admit: 2012-04-08 | Discharge: 2012-04-08 | Disposition: A | Discharge status: Home / Self Care | Payer: Medicare Other | Source: Ambulatory Visit | Attending: Family Medicine | Admitting: Family Medicine

## 2012-04-08 ENCOUNTER — Ambulatory Visit
Admission: RE | Admit: 2012-04-08 | Discharge: 2012-04-08 | Disposition: A | Discharge status: Home / Self Care | Payer: Medicare Other | Source: Ambulatory Visit | Attending: Family Medicine | Admitting: Family Medicine

## 2012-04-08 ENCOUNTER — Other Ambulatory Visit: Payer: Self-pay | Admitting: Family Medicine

## 2012-04-08 ENCOUNTER — Other Ambulatory Visit: Payer: Self-pay | Admitting: *Deleted

## 2012-04-08 DIAGNOSIS — M4800 Spinal stenosis, site unspecified: Secondary | ICD-10-CM

## 2012-04-08 DIAGNOSIS — Z13820 Encounter for screening for osteoporosis: Secondary | ICD-10-CM | POA: Insufficient documentation

## 2012-04-08 DIAGNOSIS — Z780 Asymptomatic menopausal state: Secondary | ICD-10-CM | POA: Insufficient documentation

## 2012-04-08 MED ORDER — DICLOFENAC SODIUM 1 % TD GEL: 1.0000 "application " | Freq: Four times a day (QID) | TRANSDERMAL | Status: DC

## 2012-04-09 ENCOUNTER — Ambulatory Visit (HOSPITAL_COMMUNITY): Payer: Medicare Other

## 2012-04-09 ENCOUNTER — Other Ambulatory Visit: Payer: Medicare Other

## 2012-04-19 ENCOUNTER — Ambulatory Visit (INDEPENDENT_AMBULATORY_CARE_PROVIDER_SITE_OTHER): Payer: Medicare Other | Admitting: Family Medicine

## 2012-04-19 ENCOUNTER — Encounter: Payer: Self-pay | Admitting: Family Medicine

## 2012-04-19 VITALS — BP 120/64 | HR 60 | Temp 98.0°F | Wt 181.0 lb

## 2012-04-19 DIAGNOSIS — M949 Disorder of cartilage, unspecified: Secondary | ICD-10-CM

## 2012-04-19 DIAGNOSIS — M899 Disorder of bone, unspecified: Secondary | ICD-10-CM

## 2012-04-19 MED ORDER — ALENDRONATE SODIUM 70 MG PO TABS: 70.0000 mg | ORAL_TABLET | ORAL | Status: DC

## 2012-04-19 NOTE — Progress Notes (Signed)
Subjective:    Patient ID: Molly Chandler, female    DOB: [birth year], 77 y.o.   MRN: [id number]  HPI  77 yo with h/o lymphoma, osteopenia and spinal stenosis with multiple falls recently here to discuss DEXA results.  She is taking calcium/vit D.  Mr Lumbar Spine Wo Contrast  04/08/2012  *RADIOLOGY REPORT*  Clinical Data: Spinal stenosis.  Low back pain with bilateral leg pain and tingling  MRI LUMBAR SPINE WITHOUT CONTRAST  Technique:  Multiplanar and multiecho pulse sequences of the lumbar spine were obtained without intravenous contrast.  Comparison: Lumbar MRI 03/30/2007  Findings: Normal lumbar alignment.  Heterogeneous bone marrow with multiple areas of fatty bone marrow.  No mass or fracture.  Conus medullaris is normal and terminates at L2.  L1-2:  Mild disc and facet degeneration without stenosis.  L2-3:  Mild disc bulging and mild facet degeneration.  L3-4:  Mild disc bulging.  No significant spinal stenosis.  L4-5:  Disc bulging and spondylosis.  Bilateral facet hypertrophy and mild spinal stenosis, unchanged from the  prior study.  Neural foramina are adequately patent.  L5-S1:  Disc degeneration with disc bulging and spondylosis.  Mild foraminal narrowing bilaterally. Small Schmorl's node in the inferior endplate of L5 on the right.  IMPRESSION: Mild lumbar degenerative changes are stable since 2008.  No superimposed disc protrusion or severe spinal stenosis is present.  Original Report Authenticated By: Camelia Phenes, M.D.   Dg Bone Density  04/08/2012  The Bone Mineral Densitometry hard-copy report (which includes all data, graphical display, and FRAX results when applicable) has been sent directly to the ordering physician.  This report can also be obtained electronically by viewing images for this exam through the performing facility's EMR, or by logging directly into [employer].  Original Report Authenticated By: Britta Mccreedy, M.D.  AP Spine- -1.4 Left Femur -1.7   No h/o  osteoporotic fractures.  Past Medical History  Diagnosis Date  . Cataract   . Osteoporosis     osteopenia  . Lymphoma     rectal cancer    Review of Systems Per HPI    Objective:   Physical Exam  BP 120/64  Pulse 60  Temp 98 F (36.7 C)  Wt 181 lb (82.101 kg)  Constitutional: She appears well-developed and well-nourished. No distress.  Psychiatric: She has a normal mood and affect.      Assessment & Plan:   1. OSTEOPENIA    >15 min spent with face to face with patient, >50% counseling and/or coordinating care. Given h/o lymphoma s/p treatment, h/o falls, will start fosamax. Repeat DEXA in 1 year. The patient indicates understanding of these issues and agrees with the plan.

## 2012-04-19 NOTE — Patient Instructions (Addendum)
Great to see you. We are starting fosamax. Please make sure you drink it with a full glass of water.  We will repeat your bone density in 1 year.

## 2012-04-22 ENCOUNTER — Telehealth: Payer: Self-pay

## 2012-04-22 NOTE — Telephone Encounter (Signed)
Noted.  Let's recheck her dexa in 1 year. We could consider prolia in future.

## 2012-04-22 NOTE — Telephone Encounter (Signed)
Advised patient.  She says she will take her vitamin D but refuses to get any injections.

## 2012-04-22 NOTE — Telephone Encounter (Signed)
Pt cannot take fosamax; N&V after takes Fosamax pt drank water and ate afterwards. Pt said she cannot take med. Pt said she will take her vitamin D. Pt states she cannot take prescription medicine. [place] if needed.Please advise.

## 2012-04-23 ENCOUNTER — Encounter: Payer: Self-pay | Admitting: Family Medicine

## 2012-06-11 ENCOUNTER — Emergency Department: Payer: Self-pay | Admitting: Internal Medicine

## 2012-06-14 ENCOUNTER — Telehealth: Payer: Self-pay | Admitting: Family Medicine

## 2012-06-14 NOTE — Telephone Encounter (Signed)
I cannot call them to ask for this.  Her back doctor or orthopedist will need to order this. I will route to [facility].

## 2012-06-14 NOTE — Telephone Encounter (Signed)
Caller: Sanya/Patient; Phone: [phone]; Reason for Call: Patient having severe pain in her back that runs down her legs.  Calling to see if Dr.  Dayton Martes will call [facility] to give them an order for patient to come today to have an injection.  Please call her back as soon as possible.  Thanks

## 2012-06-14 NOTE — Telephone Encounter (Signed)
Called the patient and gave her Dr Serita Grit office phone number to call for an appt with her Orthopedist.

## 2012-06-18 ENCOUNTER — Ambulatory Visit (HOSPITAL_COMMUNITY)
Admission: RE | Admit: 2012-06-18 | Discharge: 2012-06-18 | Disposition: A | Discharge status: Home / Self Care | Payer: Medicare Other | Source: Ambulatory Visit | Attending: Orthopaedic Surgery | Admitting: Orthopaedic Surgery

## 2012-06-18 DIAGNOSIS — R52 Pain, unspecified: Secondary | ICD-10-CM

## 2012-06-18 DIAGNOSIS — R252 Cramp and spasm: Secondary | ICD-10-CM

## 2012-06-18 DIAGNOSIS — IMO0001 Reserved for inherently not codable concepts without codable children: Secondary | ICD-10-CM | POA: Insufficient documentation

## 2012-06-18 DIAGNOSIS — M4800 Spinal stenosis, site unspecified: Secondary | ICD-10-CM

## 2012-06-18 DIAGNOSIS — M79609 Pain in unspecified limb: Secondary | ICD-10-CM

## 2012-06-18 DIAGNOSIS — Z85038 Personal history of other malignant neoplasm of large intestine: Secondary | ICD-10-CM

## 2012-06-18 NOTE — Progress Notes (Signed)
Right:  No evidence of DVT, superficial thrombosis, or Baker's cyst.  Left:  Negative for DVT in the common femoral vein.  

## 2012-06-25 ENCOUNTER — Ambulatory Visit (INDEPENDENT_AMBULATORY_CARE_PROVIDER_SITE_OTHER): Payer: Medicare Other | Admitting: Family Medicine

## 2012-06-25 ENCOUNTER — Encounter: Payer: Self-pay | Admitting: Family Medicine

## 2012-06-25 VITALS — BP 150/86 | HR 80 | Temp 97.9°F | Wt 177.0 lb

## 2012-06-25 DIAGNOSIS — M545 Low back pain, unspecified: Secondary | ICD-10-CM | POA: Insufficient documentation

## 2012-06-25 DIAGNOSIS — G8929 Other chronic pain: Secondary | ICD-10-CM

## 2012-06-25 NOTE — Patient Instructions (Addendum)
Good to see you.  Please stop by to see Shirlee Limerick on your way out to set up your physical therapy appointment.  Please call Dr. Cleophas Dunker to discuss spinal injections.

## 2012-06-25 NOTE — Progress Notes (Signed)
Subjective:    Patient ID: Molly Chandler, female    DOB: 1934-09-23, 77 y.o.   MRN: [id number]  HPI  77 yo with h/o lymphoma, osteopenia with chronic back pain here to discuss her pain and her MRI from August.  She is taking calcium/vit D.  MRI showed degenerative changes but stable since 2008- no spinal stenosis.  Still having low back pain with radiculopathy.  I referred her to ortho- per pt she saw ?Dr. Cleophas Dunker but we do not have these records. He does not remember what he told her to do.  She was given a course of prednisone which helped a little.  Mr Lumbar Spine Wo Contrast  04/08/2012  *RADIOLOGY REPORT*  Clinical Data: Spinal stenosis.  Low back pain with bilateral leg pain and tingling  MRI LUMBAR SPINE WITHOUT CONTRAST  Technique:  Multiplanar and multiecho pulse sequences of the lumbar spine were obtained without intravenous contrast.  Comparison: Lumbar MRI 03/30/2007  Findings: Normal lumbar alignment.  Heterogeneous bone marrow with multiple areas of fatty bone marrow.  No mass or fracture.  Conus medullaris is normal and terminates at L2.  L1-2:  Mild disc and facet degeneration without stenosis.  L2-3:  Mild disc bulging and mild facet degeneration.  L3-4:  Mild disc bulging.  No significant spinal stenosis.  L4-5:  Disc bulging and spondylosis.  Bilateral facet hypertrophy and mild spinal stenosis, unchanged from the  prior study.  Neural foramina are adequately patent.  L5-S1:  Disc degeneration with disc bulging and spondylosis.  Mild foraminal narrowing bilaterally. Small Schmorl's node in the inferior endplate of L5 on the right.  IMPRESSION: Mild lumbar degenerative changes are stable since 2008.  No superimposed disc protrusion or severe spinal stenosis is present.  Original Report Authenticated By: Camelia Phenes, M.D.   Dg Bone Density  04/08/2012  The Bone Mineral Densitometry hard-copy report (which includes all data, graphical display, and FRAX results when  applicable) has been sent directly to the ordering physician.  This report can also be obtained electronically by viewing images for this exam through the performing facility's EMR, or by logging directly into [employer].  Original Report Authenticated By: Britta Mccreedy, M.D.  AP Spine- -1.4 Left Femur -1.7   No h/o osteoporotic fractures. Started fosamax in August 2013.  Past Medical History  Diagnosis Date  . Cataract   . Osteoporosis     osteopenia  . Lymphoma     rectal cancer    Review of Systems Per HPI    Objective:   Physical Exam  BP 150/86  Pulse 80  Temp 97.9 F (36.6 C)  Wt 177 lb (80.287 kg)  Constitutional: She appears well-developed and well-nourished. No distress.  Psychiatric: She has a normal mood and affect.      Assessment & Plan:   1. Chronic low back pain     Deteriorated. >15 min spent with face to face with patient, >50% counseling and/or coordinating care. Strongly urged her to call ortho to find out what the next step would be--?cortisone injections in her spine. She has not yet tried PT- will refer for PT. Will request records from ortho. The patient indicates understanding of these issues and agrees with the plan.

## 2012-06-28 ENCOUNTER — Other Ambulatory Visit: Payer: Self-pay | Admitting: *Deleted

## 2012-06-28 MED ORDER — DICLOFENAC SODIUM 1 % TD GEL: TRANSDERMAL | Status: DC

## 2012-06-28 NOTE — Telephone Encounter (Signed)
Faxed refill request from [facility], last filled 04/08/12.

## 2012-07-05 ENCOUNTER — Encounter: Payer: Self-pay | Admitting: Family Medicine

## 2012-07-09 ENCOUNTER — Encounter: Payer: Self-pay | Admitting: Family Medicine

## 2012-07-14 ENCOUNTER — Encounter: Payer: Self-pay | Admitting: Family Medicine

## 2012-07-14 ENCOUNTER — Ambulatory Visit (INDEPENDENT_AMBULATORY_CARE_PROVIDER_SITE_OTHER)
Admission: RE | Admit: 2012-07-14 | Discharge: 2012-07-14 | Disposition: A | Discharge status: Home / Self Care | Payer: Medicare Other | Source: Ambulatory Visit | Attending: Family Medicine | Admitting: Family Medicine

## 2012-07-14 ENCOUNTER — Ambulatory Visit (INDEPENDENT_AMBULATORY_CARE_PROVIDER_SITE_OTHER): Payer: Medicare Other | Admitting: Family Medicine

## 2012-07-14 VITALS — BP 118/64 | HR 72 | Temp 98.0°F | Wt 176.0 lb

## 2012-07-14 DIAGNOSIS — Z79899 Other long term (current) drug therapy: Secondary | ICD-10-CM

## 2012-07-14 DIAGNOSIS — M25551 Pain in right hip: Secondary | ICD-10-CM

## 2012-07-14 DIAGNOSIS — M25559 Pain in unspecified hip: Secondary | ICD-10-CM

## 2012-07-14 DIAGNOSIS — R319 Hematuria, unspecified: Secondary | ICD-10-CM

## 2012-07-14 DIAGNOSIS — R5381 Other malaise: Secondary | ICD-10-CM

## 2012-07-14 DIAGNOSIS — R5383 Other fatigue: Secondary | ICD-10-CM | POA: Insufficient documentation

## 2012-07-14 LAB — CBC WITH DIFFERENTIAL/PLATELET
Basophils Absolute: 0 10*3/uL (ref 0.0–0.1)
Basophils Relative: 0.6 % (ref 0.0–3.0)
Eosinophils Absolute: 0.1 10*3/uL (ref 0.0–0.7)
Eosinophils Relative: 2.2 % (ref 0.0–5.0)
HCT: 36.3 % (ref 36.0–46.0)
Hemoglobin: 11.9 g/dL — ABNORMAL LOW (ref 12.0–15.0)
Lymphocytes Relative: 30.7 % (ref 12.0–46.0)
Lymphs Abs: 1.5 10*3/uL (ref 0.7–4.0)
MCHC: 32.6 g/dL (ref 30.0–36.0)
MCV: 92.9 fl (ref 78.0–100.0)
Monocytes Absolute: 0.4 10*3/uL (ref 0.1–1.0)
Monocytes Relative: 8.6 % (ref 3.0–12.0)
Neutro Abs: 2.8 10*3/uL (ref 1.4–7.7)
Neutrophils Relative %: 57.9 % (ref 43.0–77.0)
Platelets: 197 10*3/uL (ref 150.0–400.0)
RBC: 3.91 Mil/uL (ref 3.87–5.11)
RDW: 14.4 % (ref 11.5–14.6)
WBC: 4.8 10*3/uL (ref 4.5–10.5)

## 2012-07-14 LAB — TSH: TSH: 0.91 u[IU]/mL (ref 0.35–5.50)

## 2012-07-14 LAB — POCT URINALYSIS DIPSTICK
Bilirubin, UA: NEGATIVE
Glucose, UA: NEGATIVE
Ketones, UA: NEGATIVE
Leukocytes, UA: NEGATIVE
Nitrite, UA: NEGATIVE
Protein, UA: NEGATIVE
Spec Grav, UA: 1.015
Urobilinogen, UA: NEGATIVE
pH, UA: 6.5

## 2012-07-14 LAB — VITAMIN B12: Vitamin B-12: 472 pg/mL (ref 211–911)

## 2012-07-14 NOTE — Progress Notes (Signed)
Subjective:    Patient ID: Molly Chandler, female    DOB: 13-Nov-1934, 77 y.o.   MRN: [id number]  HPI  77 yo with h/o lymphoma, osteopenia with chronic back pain here to discuss :  1.  Fatigue-  Past several months feels more fatigued.  Has never been a great sleeper but she does not feel it is worse. No CP or SOB.  Denies any symptoms of hypothyroidism.  She has not followed up with Dr. Myna Hidalgo for her lymphoma in several months.  2.  Back pain- MRI in 03/2012 showed degenerative changes.  She has been going to physical therapy which has been helping but now her right hip hurts.  3.  ?UTI- has had increased urinary frequency.  No dysuria, back pain, nausea, vomiting or fevers.  Patient Active Problem List  Diagnosis  . LYMPHOMA  . MOURNING  . UNSPECIFIED MYALGIA AND MYOSITIS  . OSTEOPENIA  . COLON CANCER, HX OF  . MEMORY LOSS  . URINARY INCONTINENCE, MIXED  . Leg cramps  . Right lumbar radiculopathy  . GERD (gastroesophageal reflux disease)  . Left knee pain  . Right arm pain  . Spinal stenosis  . Chronic low back pain  . Fatigue   Past Medical History  Diagnosis Date  . Cataract   . Osteoporosis     osteopenia  . Lymphoma     rectal cancer   No past surgical history on file. History  Substance Use Topics  . Smoking status: Former Games developer  . Smokeless tobacco: Not on file  . Alcohol Use: No   No family history on file. Allergies  Allergen Reactions  . Amoxicillin     GI intolerance.   . Metoclopramide Hcl    Current Outpatient Prescriptions on File Prior to Visit  Medication Sig Dispense Refill  . acetaminophen (TYLENOL) 325 MG tablet Take 650 mg by mouth every 6 (six) hours as needed.        Marland Kitchen alendronate (FOSAMAX) 70 MG tablet Take 1 tablet (70 mg total) by mouth every 7 (seven) days. Take with a full glass of water on an empty stomach.  4 tablet  11  . calcium carbonate (OS-CAL) 1250 MG chewable tablet Chew 1 tablet by mouth daily.        .  Cholecalciferol (VITAMIN D3) 2000 UNITS capsule Take 2,000 Units by mouth daily.        . diclofenac sodium (VOLTAREN) 1 % GEL Apply topically four times daily  100 g  0  . Multiple Vitamin (MULTIVITAMIN) tablet Take 1 tablet by mouth daily.        . Travoprost, BAK Free, (TRAVATAMN) 0.004 % SOLN ophthalmic solution Place 1 drop into both eyes at bedtime.         The PMH, PSH, Social History, Family History, Medications, and allergies have been reviewed in [facility], and have been updated if relevant.     Review of Systems Per HPI No night sweats No weight loss    Objective:   Physical Exam  BP 118/64  Pulse 72  Temp 98 F (36.7 C)  Wt 176 lb (79.833 kg)    Appears well, in no apparent distress.  Vital signs are normal. The abdomen is soft without tenderness, guarding, mass, rebound or organomegaly. No CVA tenderness or inguinal adenopathy noted. Urine dipstick shows positive for RBC's.   Right trochanteric bursa mildly TTP, normal range of motion- no pain with external or internal rotation.      Assessment &  Plan:   1. Fatigue  New - no red flag symptoms but she does have a h/o of lymphoma and was lost to follow up.  Check labs today.   Marland Kitchenagere  TSH, Vitamin B12, CBC with Differential  2. Right hip pain  Likely trochanteric bursitis.  Hip xray today.  Discussed hip injections if pain continues. DG Hip Complete Right   3.  Increased urinary frequency- UA neg except for trace blood.  Send urine for cx to rule out infectious source.  The patient indicates understanding of these issues and agrees with the plan.

## 2012-07-14 NOTE — Addendum Note (Signed)
Addended by: Eliezer Bottom on: 07/14/2012 10:33 AM   Modules accepted: Orders

## 2012-07-14 NOTE — Patient Instructions (Addendum)
We are going to get an xray of your hip- I will call you with your results.  We will also call you with your lab work.  Your urine looked ok- we will send it for culture and call you with the results.

## 2012-07-15 ENCOUNTER — Telehealth: Payer: Self-pay

## 2012-07-15 NOTE — Telephone Encounter (Signed)
Pt wants to pick up cd of hip xray to take to Dr Atilano Median. Rodney Booze advised will be at front desk for pickup. Pt notified while on phone.

## 2012-07-16 LAB — URINE CULTURE: Colony Count: 4000

## 2012-08-17 ENCOUNTER — Encounter (INDEPENDENT_AMBULATORY_CARE_PROVIDER_SITE_OTHER): Payer: Medicare Other

## 2012-08-17 ENCOUNTER — Encounter: Payer: Self-pay | Admitting: Family Medicine

## 2012-08-17 ENCOUNTER — Ambulatory Visit (INDEPENDENT_AMBULATORY_CARE_PROVIDER_SITE_OTHER): Payer: Medicare Other | Admitting: Family Medicine

## 2012-08-17 ENCOUNTER — Ambulatory Visit (INDEPENDENT_AMBULATORY_CARE_PROVIDER_SITE_OTHER)
Admission: RE | Admit: 2012-08-17 | Discharge: 2012-08-17 | Disposition: A | Discharge status: Home / Self Care | Payer: Medicare Other | Source: Ambulatory Visit | Attending: Family Medicine | Admitting: Family Medicine

## 2012-08-17 VITALS — BP 130/82 | HR 80 | Temp 98.1°F | Wt 178.0 lb

## 2012-08-17 DIAGNOSIS — M79604 Pain in right leg: Secondary | ICD-10-CM

## 2012-08-17 DIAGNOSIS — M549 Dorsalgia, unspecified: Secondary | ICD-10-CM

## 2012-08-17 DIAGNOSIS — M79609 Pain in unspecified limb: Secondary | ICD-10-CM

## 2012-08-17 DIAGNOSIS — M7989 Other specified soft tissue disorders: Secondary | ICD-10-CM

## 2012-08-17 NOTE — Progress Notes (Signed)
Subjective:    Patient ID: Molly Chandler, female    DOB: 1935/06/02, 77 y.o.   MRN: [id number]  HPI  77 yo with h/o lymphoma, osteopenia with chronic back pain here to discuss her back pain.   Lumbar MRI showed degenerative changes but stable since 2008- no spinal stenosis.  She has been seeing her chiroproactor, ortho and I referred her PT.  Per pt, her orthopedist requested "back xrays."  Right lower leg swelling, redness and swelling x 2 weeks.  No recent travel or prolonged periods of sitting.  H/o lymphoma.  Had doppler done several months ago of right lower leg to rule out DVT (Dr. Dwain Sarna) but this was prior to these symptoms.  Per pt, ordered because of right lower leg pain.   Past Medical History  Diagnosis Date  . Cataract   . Osteoporosis     osteopenia  . Lymphoma     rectal cancer    Review of Systems Per HPI   No fever or chills Objective:   Physical Exam  BP 130/82  Pulse 80  Temp 98.1 F (36.7 C)  Wt 178 lb (80.74 kg)  Constitutional: She appears well-developed and well-nourished. No distress.  Psychiatric: She has a normal mood and affect.  Right lower leg: Small area of erythema and swelling left lower lateral leg, mildly TTP     Assessment & Plan:   1. Right leg pain  Without know trauma/injury.  GIven h/o malignancy, important to rule out DVT.  Doppler order. Lower Extremity Venous Reflux Right  2. Back pain  Explained to Molly Chandler that we have an MRI of her lumbar spine, she does not need xrays of her lumbar spine.  Per pt request, will order c spine and thoracic spine xrays. DG Cervical Spine Complete, DG Thoracic Spine W/Swimmers    Deteriorated. >15 min spent with face to face with patient, >50% counseling and/or coordinating care. Strongly urged her to call ortho to find out what the next step would be--?cortisone injections in her spine. She has not yet tried PT- will refer for PT. Will request records from ortho. The patient  indicates understanding of these issues and agrees with the plan.

## 2012-08-17 NOTE — Patient Instructions (Addendum)
Good to see you. After you to get your xrays done, please stop by to see Molly Chandler on your way out to set up your ultrasound for your leg.

## 2012-08-18 ENCOUNTER — Telehealth: Payer: Self-pay | Admitting: Family Medicine

## 2012-08-18 DIAGNOSIS — M2540 Effusion, unspecified joint: Secondary | ICD-10-CM

## 2012-08-18 NOTE — Telephone Encounter (Signed)
Patient says that the [place] Tech at [facility] told her that she didn't have a blood clot yesterday. She now would like to know what she is supposed to do about the knot on her ankle that is warm to touch and painful? Please call the patient and advise.

## 2012-08-19 ENCOUNTER — Other Ambulatory Visit (INDEPENDENT_AMBULATORY_CARE_PROVIDER_SITE_OTHER): Payer: Medicare Other

## 2012-08-19 ENCOUNTER — Telehealth: Payer: Self-pay

## 2012-08-19 DIAGNOSIS — M2540 Effusion, unspecified joint: Secondary | ICD-10-CM

## 2012-08-19 LAB — CBC WITH DIFFERENTIAL/PLATELET
Basophils Absolute: 0 10*3/uL (ref 0.0–0.1)
Basophils Relative: 0.6 % (ref 0.0–3.0)
Eosinophils Absolute: 0.1 10*3/uL (ref 0.0–0.7)
Eosinophils Relative: 2.5 % (ref 0.0–5.0)
HCT: 35.4 % — ABNORMAL LOW (ref 36.0–46.0)
Hemoglobin: 11.9 g/dL — ABNORMAL LOW (ref 12.0–15.0)
Lymphocytes Relative: 25 % (ref 12.0–46.0)
Lymphs Abs: 1.2 10*3/uL (ref 0.7–4.0)
MCHC: 33.6 g/dL (ref 30.0–36.0)
MCV: 92.5 fl (ref 78.0–100.0)
Monocytes Absolute: 0.4 10*3/uL (ref 0.1–1.0)
Monocytes Relative: 7.6 % (ref 3.0–12.0)
Neutro Abs: 3.1 10*3/uL (ref 1.4–7.7)
Neutrophils Relative %: 64.3 % (ref 43.0–77.0)
Platelets: 195 10*3/uL (ref 150.0–400.0)
RBC: 3.83 Mil/uL — ABNORMAL LOW (ref 3.87–5.11)
RDW: 14.1 % (ref 11.5–14.6)
WBC: 4.8 10*3/uL (ref 4.5–10.5)

## 2012-08-19 LAB — URIC ACID: Uric Acid, Serum: 3.8 mg/dL (ref 2.4–7.0)

## 2012-08-19 NOTE — Telephone Encounter (Signed)
Pt wanted results of labs done today. Results are not available yet. Pt request call back when results come in.

## 2012-08-19 NOTE — Telephone Encounter (Signed)
Lab appt scheduled, patient advised.

## 2012-08-19 NOTE — Telephone Encounter (Signed)
Please have her come in today for labs- I will place orders.  If labs all ok, we can try short course of abx in case this is a skin infection.

## 2012-08-20 ENCOUNTER — Other Ambulatory Visit: Payer: Self-pay | Admitting: *Deleted

## 2012-08-20 MED ORDER — DOXYCYCLINE HYCLATE 100 MG PO CPEP: ORAL_CAPSULE | ORAL | Status: DC

## 2012-08-20 NOTE — Telephone Encounter (Signed)
Patient has been advised of results.  

## 2012-08-30 ENCOUNTER — Telehealth: Payer: Self-pay

## 2012-08-30 NOTE — Telephone Encounter (Signed)
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. Pt has productive cough with yellow phlegm,head congested,S/T but no fever for 2 days. Advised pt needs to be seen before calling in antibiotic. 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 Please advise.

## 2012-08-30 NOTE — Telephone Encounter (Signed)
Needs office visit for evaluation.  Could also be viral infection in which case she doesn't need abx, but cannot tell without evaluating her. Probably best with her h/o lymphoma to be evaluated for this.

## 2012-08-30 NOTE — Telephone Encounter (Signed)
Patient notified and said it was just impossible for her to get in during our office hours. I advised that she should go to [facility] as they are open later if that would help with her schedule. I advised with her history, she needed eval. She verbalized understanding and said she may go to [facility] later this evening.

## 2012-09-28 ENCOUNTER — Encounter: Payer: Self-pay | Admitting: Family Medicine

## 2012-09-28 ENCOUNTER — Ambulatory Visit (INDEPENDENT_AMBULATORY_CARE_PROVIDER_SITE_OTHER): Payer: Medicare Other | Admitting: Family Medicine

## 2012-09-28 VITALS — BP 120/72 | HR 80 | Temp 98.2°F | Ht 59.5 in | Wt 179.0 lb

## 2012-09-28 DIAGNOSIS — K219 Gastro-esophageal reflux disease without esophagitis: Secondary | ICD-10-CM

## 2012-09-28 DIAGNOSIS — R5381 Other malaise: Secondary | ICD-10-CM

## 2012-09-28 DIAGNOSIS — R5383 Other fatigue: Secondary | ICD-10-CM

## 2012-09-28 NOTE — Assessment & Plan Note (Addendum)
Lab eval with B12, CMET. TSH cbc in 07/2012 was unremarkable. EKG today was  NSR, no ST changes, No Q. Follow up with Dr. Dayton Martes if not imrpoving.

## 2012-09-28 NOTE — Progress Notes (Signed)
Subjective:    Patient ID: Molly Chandler, female    DOB: Jun 09, 1935, 78 y.o.   MRN: [id number]  HPI 78 year old female pt of Dr. Elmer Sow with  remote history of lymphoma  presents with dizziness (off balance, lightheaded) x 1 days.  Now resolved. She reports fatigue as well for several months Tounge feels scalded like from indigestion x 1 week. Mouth has been very dry. Occ heartburn, some burning in throat after eating.  Occuring daily x months. No fever. No SOB, no chest pain. Occ pain in left groin.  Has tried Listerine. Has not tried any other med.  Using BC powder -2 times a week.  07/2012 labs mild anemia, Hg 11.9, nml thyroid, B12 nml  Review of Systems  Constitutional: Positive for fatigue. Negative for fever.  HENT: Negative for ear pain.   Eyes: Negative for pain.  Respiratory: Negative for chest tightness and shortness of breath.   Cardiovascular: Positive for chest pain. Negative for palpitations and leg swelling.  Gastrointestinal: Negative for abdominal pain.  Genitourinary: Negative for dysuria.       Objective:   Physical Exam  Constitutional: Vital signs are normal. She appears well-developed and well-nourished. She is cooperative.  Non-toxic appearance. She does not appear ill. No distress.  HENT:  Head: Normocephalic.  Right Ear: Hearing, tympanic membrane, external ear and ear canal normal. Tympanic membrane is not erythematous, not retracted and not bulging.  Left Ear: Hearing, tympanic membrane, external ear and ear canal normal. Tympanic membrane is not erythematous, not retracted and not bulging.  Nose: No mucosal edema or rhinorrhea. Right sinus exhibits no maxillary sinus tenderness and no frontal sinus tenderness. Left sinus exhibits no maxillary sinus tenderness and no frontal sinus tenderness.  Mouth/Throat: Uvula is midline, oropharynx is clear and moist and mucous membranes are normal.  Eyes: Conjunctivae normal, EOM and lids are normal. Pupils  are equal, round, and reactive to light. No foreign bodies found.  Neck: Trachea normal and normal range of motion. Neck supple. Carotid bruit is not present. No mass and no thyromegaly present.  Cardiovascular: Normal rate, regular rhythm, S1 normal, S2 normal, normal heart sounds, intact distal pulses and normal pulses.  Exam reveals no gallop and no friction rub.   No murmur heard. Pulmonary/Chest: Effort normal and breath sounds normal. Not tachypneic. No respiratory distress. She has no decreased breath sounds. She has no wheezes. She has no rhonchi. She has no rales.  Abdominal: Soft. Normal appearance and bowel sounds are normal. There is tenderness in the epigastric area. There is no rigidity, no rebound, no guarding and no CVA tenderness.  Neurological: She is alert.  Skin: Skin is warm, dry and intact. No rash noted.  Psychiatric: Her speech is normal and behavior is normal. Judgment and thought content normal. Her mood appears not anxious. Cognition and memory are normal. She does not exhibit a depressed mood.          Assessment & Plan:

## 2012-09-28 NOTE — Assessment & Plan Note (Addendum)
Poor control. Some epigastric ttp likely gastritis. Stop BC powder. Trigger avoidance. Start prilosec 40mg  daily. Call if not imrpvoing in 2 weeks.

## 2012-09-28 NOTE — Patient Instructions (Addendum)
Start prilosec 2 tabs x 20 mg, avoid caffeine, stop BC powder ( use tylenol instead), citris, tomato, chocolate. Call if not better in 2 week, if you do feel better from heartburn.. Complete 4-6 weeks of prilosec then wean. Follow up if your symptoms are not improving with Dr. Dayton Martes.

## 2012-11-10 ENCOUNTER — Encounter: Payer: Self-pay | Admitting: Family Medicine

## 2012-11-10 ENCOUNTER — Ambulatory Visit (INDEPENDENT_AMBULATORY_CARE_PROVIDER_SITE_OTHER)
Admission: RE | Admit: 2012-11-10 | Discharge: 2012-11-10 | Disposition: A | Discharge status: Home / Self Care | Payer: Medicare Other | Source: Ambulatory Visit | Attending: Family Medicine | Admitting: Family Medicine

## 2012-11-10 ENCOUNTER — Ambulatory Visit (INDEPENDENT_AMBULATORY_CARE_PROVIDER_SITE_OTHER): Payer: Medicare Other | Admitting: Family Medicine

## 2012-11-10 VITALS — BP 138/70 | HR 61 | Temp 98.1°F | Wt 180.5 lb

## 2012-11-10 DIAGNOSIS — R5383 Other fatigue: Secondary | ICD-10-CM

## 2012-11-10 DIAGNOSIS — R059 Cough, unspecified: Secondary | ICD-10-CM

## 2012-11-10 DIAGNOSIS — R05 Cough: Secondary | ICD-10-CM

## 2012-11-10 DIAGNOSIS — R5381 Other malaise: Secondary | ICD-10-CM

## 2012-11-10 LAB — CBC WITH DIFFERENTIAL/PLATELET
Basophils Absolute: 0 10*3/uL (ref 0.0–0.1)
Basophils Relative: 0.8 % (ref 0.0–3.0)
Eosinophils Absolute: 0.1 10*3/uL (ref 0.0–0.7)
Eosinophils Relative: 2 % (ref 0.0–5.0)
HCT: 37.7 % (ref 36.0–46.0)
Hemoglobin: 12.6 g/dL (ref 12.0–15.0)
Lymphocytes Relative: 28.6 % (ref 12.0–46.0)
Lymphs Abs: 1.5 10*3/uL (ref 0.7–4.0)
MCHC: 33.4 g/dL (ref 30.0–36.0)
MCV: 91 fl (ref 78.0–100.0)
Monocytes Absolute: 0.4 10*3/uL (ref 0.1–1.0)
Monocytes Relative: 8.1 % (ref 3.0–12.0)
Neutro Abs: 3.1 10*3/uL (ref 1.4–7.7)
Neutrophils Relative %: 60.5 % (ref 43.0–77.0)
Platelets: 202 10*3/uL (ref 150.0–400.0)
RBC: 4.14 Mil/uL (ref 3.87–5.11)
RDW: 13.6 % (ref 11.5–14.6)
WBC: 5.2 10*3/uL (ref 4.5–10.5)

## 2012-11-10 LAB — COMPREHENSIVE METABOLIC PANEL
ALT: 16 U/L (ref 0–35)
AST: 22 U/L (ref 0–37)
Albumin: 3.9 g/dL (ref 3.5–5.2)
Alkaline Phosphatase: 62 U/L (ref 39–117)
BUN: 16 mg/dL (ref 6–23)
CO2: 28 mEq/L (ref 19–32)
Calcium: 9.5 mg/dL (ref 8.4–10.5)
Chloride: 104 mEq/L (ref 96–112)
Creatinine, Ser: 0.6 mg/dL (ref 0.4–1.2)
GFR: 103.37 mL/min (ref >60.00)
Glucose, Bld: 85 mg/dL (ref 70–99)
Potassium: 4.6 mEq/L (ref 3.5–5.1)
Sodium: 139 mEq/L (ref 135–145)
Total Bilirubin: 0.6 mg/dL (ref 0.3–1.2)
Total Protein: 7.1 g/dL (ref 6.0–8.3)

## 2012-11-10 LAB — TSH: TSH: 1.59 u[IU]/mL (ref 0.35–5.50)

## 2012-11-10 LAB — VITAMIN B12: Vitamin B-12: 437 pg/mL (ref 211–911)

## 2012-11-10 NOTE — Progress Notes (Signed)
Subjective:    Patient ID: Molly Chandler, female    DOB: 1935/02/06, 78 y.o.   MRN: [id number]  HPI 78 year old very pleasant female  with  remote history of lymphoma  presents fatigue and cough x several months.    No fever. No SOB, no chest pain.  Non smoker.  Cough is usually in the morning and can be productive.  She does have GERD but felt prilosec did not help with the cough.  07/2012 labs mild anemia, Hg 11.9, nml thyroid, B12 nml  Saw Dr. Ermalene Searing for this complaint in 09/2012, EKG was normal.  Patient Active Problem List  Diagnosis  . LYMPHOMA  . MOURNING  . UNSPECIFIED MYALGIA AND MYOSITIS  . OSTEOPENIA  . COLON CANCER, HX OF  . MEMORY LOSS  . URINARY INCONTINENCE, MIXED  . Leg cramps  . Right lumbar radiculopathy  . GERD (gastroesophageal reflux disease)  . Left knee pain  . Right arm pain  . Spinal stenosis  . Chronic low back pain  . Fatigue  . Cough   Past Medical History  Diagnosis Date  . Cataract   . Osteoporosis     osteopenia  . Lymphoma     rectal cancer   No past surgical history on file. History  Substance Use Topics  . Smoking status: Former Games developer  . Smokeless tobacco: Not on file  . Alcohol Use: No   No family history on file. Allergies  Allergen Reactions  . Amoxicillin     GI intolerance.   . Metoclopramide Hcl    Current Outpatient Prescriptions on File Prior to Visit  Medication Sig Dispense Refill  . Aspirin-Salicylamide-Caffeine (BC HEADACHE POWDER PO) Take by mouth.      . calcium carbonate (OS-CAL) 1250 MG chewable tablet Chew 1 tablet by mouth daily.        . Cholecalciferol (VITAMIN D3) 2000 UNITS capsule Take 2,000 Units by mouth daily.        . Probiotic Product (PROBIOTIC DAILY PO) Take by mouth.      . Travoprost, BAK Free, (TRAVATAMN) 0.004 % SOLN ophthalmic solution Place 1 drop into both eyes at bedtime.         No current facility-administered medications on file prior to visit.   The PMH, PSH, Social  History, Family History, Medications, and allergies have been reviewed in [facility], and have been updated if relevant.   Review of Systems  Constitutional: Positive for fatigue. Negative for fever.  Appetite good- weight stable. Wt Readings from Last 3 Encounters:  11/10/12 180 lb 8 oz (81.874 kg)  09/28/12 179 lb (81.194 kg)  08/17/12 178 lb (80.74 kg)    HENT: Negative for ear pain.   Eyes: Negative for pain.  Respiratory: Negative for chest tightness and shortness of breath.   Cardiovascular: Positive for chest pain. Negative for palpitations and leg swelling.  Gastrointestinal: Negative for abdominal pain.  Genitourinary: Negative for dysuria.       Objective:   Physical Exam  BP 138/70  Pulse 61  Temp(Src) 98.1 F (36.7 C) (Oral)  Wt 180 lb 8 oz (81.874 kg)  BMI 35.86 kg/m2  SpO2 97%  Constitutional: Vital signs are normal. She appears well-developed and well-nourished. She is cooperative.  Non-toxic appearance. She does not appear ill. No distress.  HENT:  Head: Normocephalic.  Right Ear: Hearing, tympanic membrane, external ear and ear canal normal. Tympanic membrane is not erythematous, not retracted and not bulging.  Left Ear: Hearing, tympanic  membrane, external ear and ear canal normal. Tympanic membrane is not erythematous, not retracted and not bulging.  Nose: No mucosal edema or rhinorrhea. Right sinus exhibits no maxillary sinus tenderness and no frontal sinus tenderness. Left sinus exhibits no maxillary sinus tenderness and no frontal sinus tenderness.  Mouth/Throat: Uvula is midline, oropharynx is clear and moist and mucous membranes are normal.  Eyes: Conjunctivae normal, EOM and lids are normal. Pupils are equal, round, and reactive to light. No foreign bodies found.  Neck: Trachea normal and normal range of motion. Neck supple. Carotid bruit is not present. No mass and no thyromegaly present.  Cardiovascular: Normal rate, regular rhythm, S1 normal, S2 normal,  normal heart sounds, intact distal pulses and normal pulses.  Exam reveals no gallop and no friction rub.   No murmur heard. Pulmonary/Chest: Effort normal and breath sounds normal. Not tachypneic. No respiratory distress. She has no decreased breath sounds. She has no wheezes. She has no rhonchi. She has no rales.  Abdominal: Soft. Normal appearance and bowel sounds are normal. There is tenderness in the epigastric area. There is no rigidity, no rebound, no guarding and no CVA tenderness.  Neurological: She is alert.  Skin: Skin is warm, dry and intact. No rash noted.  Psychiatric: Her speech is normal and behavior is normal. Judgment and thought content normal. Her mood appears not anxious. Cognition and memory are normal. She does not exhibit a depressed mood.      Assessment & Plan:  1. Fatigue Persistent with unclear etiology.  She does not sleep well but states "she has never slept well."  Repeat labs today, CXR.   - DG Chest 2 View; Future - CBC with Differential - TSH - Vitamin D, 25-hydroxy - Comprehensive metabolic panel - Vitamin B12  2. Cough Lungs clear- see above.  I do feel this may still be related to poorly controlled GERD.  She is non compliant with trigger avoidance such as NSAIDs.  If CXR neg, start Nexium 40 mg daily and refer to GI for endoscopy. The patient indicates understanding of these issues and agrees with the plan.  - DG Chest 2 View; Future

## 2012-11-10 NOTE — Patient Instructions (Addendum)
Check with your insurance to see if they will cover the shingles shot. We will call you with your lab results and xray.

## 2012-11-11 LAB — VITAMIN D 25 HYDROXY (VIT D DEFICIENCY, FRACTURES): Vit D, 25-Hydroxy: 17 ng/mL — ABNORMAL LOW (ref 30–89)

## 2012-11-11 MED ORDER — VITAMIN D (ERGOCALCIFEROL) 1.25 MG (50000 UNIT) PO CAPS: 50000.0000 [IU] | ORAL_CAPSULE | ORAL | Status: DC

## 2012-11-11 NOTE — Addendum Note (Signed)
Addended by: Eliezer Bottom on: 11/11/2012 03:07 PM   Modules accepted: Orders, Medications

## 2013-01-20 ENCOUNTER — Other Ambulatory Visit (INDEPENDENT_AMBULATORY_CARE_PROVIDER_SITE_OTHER): Payer: Medicare Other

## 2013-01-20 ENCOUNTER — Ambulatory Visit (INDEPENDENT_AMBULATORY_CARE_PROVIDER_SITE_OTHER): Payer: Medicare Other | Admitting: Internal Medicine

## 2013-01-20 ENCOUNTER — Encounter: Payer: Self-pay | Admitting: Internal Medicine

## 2013-01-20 ENCOUNTER — Telehealth: Payer: Self-pay | Admitting: Family Medicine

## 2013-01-20 VITALS — BP 118/78 | HR 63 | Temp 97.9°F | Wt 179.5 lb

## 2013-01-20 DIAGNOSIS — M5431 Sciatica, right side: Secondary | ICD-10-CM

## 2013-01-20 DIAGNOSIS — E559 Vitamin D deficiency, unspecified: Secondary | ICD-10-CM

## 2013-01-20 DIAGNOSIS — M5430 Sciatica, unspecified side: Secondary | ICD-10-CM

## 2013-01-20 MED ORDER — PREDNISONE 20 MG PO TABS: 40.0000 mg | ORAL_TABLET | Freq: Every day | ORAL | Status: DC

## 2013-01-20 NOTE — Telephone Encounter (Signed)
Will evaluate at appt 

## 2013-01-20 NOTE — Telephone Encounter (Signed)
Patient Information:  Caller Name: Dot  Phone: [phone]  Patient: Molly Chandler, Molly Chandler  Gender: Female  DOB: 29-Nov-1934  Age: 78 Years  PCP: Ruthe Mannan [facility])  Office Follow Up:  Does the office need to follow up with this patient?: No  Instructions For The Office: N/A   Symptoms  Reason For Call & Symptoms: Pt is calling about severe back pain on set 2 days ago. This is a reoccurance. She was seen 1 month ago in the ED and prescribed Prednisone. The pt has seen her chiropractor Dr. Atilano Median x 3 this week. The pain is intense. The pain is in her low back and down her leg.  Reviewed Health History In EMR: Yes  Reviewed Medications In EMR: Yes  Reviewed Allergies In EMR: Yes  Reviewed Surgeries / Procedures: Yes  Date of Onset of Symptoms: 01/18/2013  Guideline(s) Used:  Back Pain  Disposition Per Guideline:   Go to Office Now  Reason For Disposition Reached:   Severe back pain  Advice Given:  N/A  Patient Will Follow Care Advice:  YES  Appointment Scheduled:  01/20/2013 14:45:00 Appointment Scheduled Provider:  Tillman Abide (Family Practice)

## 2013-01-20 NOTE — Assessment & Plan Note (Signed)
Some leg weakness but non specific No other signs to suggest nerve root compression Will try steroid pulse Continue other meds/chiropractic May need to consider epidural injections again

## 2013-01-20 NOTE — Progress Notes (Signed)
Subjective:    Patient ID: Molly Chandler, female    DOB: 1935-01-10, 78 y.o.   MRN: [id number]  HPI Thinks her sciatic nerve is acting up again Micah Flesher to chiropractor and he wanted another MRI Wonders about a trial with steroids  Started this time 2 nights ago Then really bad yesterday AM Right low back pain and goes down entire back of leg Having some muscle spasms in thighs Has numbness as well No apparent weakness in legs  Was bad like this last fall Got PT then at [facility] and chiropractic  Has used left over hydrocodone and ibuprofen  Current Outpatient Prescriptions on File Prior to Visit  Medication Sig Dispense Refill  . Aspirin-Salicylamide-Caffeine (BC HEADACHE POWDER PO) Take by mouth.      . calcium carbonate (OS-CAL) 1250 MG chewable tablet Chew 1 tablet by mouth daily.        . Cholecalciferol (VITAMIN D3) 2000 UNITS capsule Take 2,000 Units by mouth daily.        . Probiotic Product (PROBIOTIC DAILY PO) Take by mouth.      . Travoprost, BAK Free, (TRAVATAMN) 0.004 % SOLN ophthalmic solution Place 1 drop into both eyes at bedtime.         No current facility-administered medications on file prior to visit.    Allergies  Allergen Reactions  . Amoxicillin     GI intolerance.   . Metoclopramide Hcl     Past Medical History  Diagnosis Date  . Cataract   . Osteoporosis     osteopenia  . Lymphoma     rectal cancer    No past surgical history on file.  No family history on file.  History   Social History  . Marital Status: Widowed    Spouse Name: N/A    Number of Children: 5  . Years of Education: N/A   Occupational History  . Retired    Social History Main Topics  . Smoking status: Former Games developer  . Smokeless tobacco: Not on file  . Alcohol Use: No  . Drug Use:   . Sexually Active:    Other Topics Concern  . Not on file   Social History Narrative  . No narrative on file   Review of Systems Bowels are fine Past urinary  incontinence---improved with last chiropractic Rx this week     Objective:   Physical Exam  Constitutional: She appears well-developed and well-nourished. No distress.  Musculoskeletal:  ?slight tenderness ~L5 Normal back flexion Normal ROM of hips but some pain with full internal rotation SLR negative bilaterally  Neurological:  Gait is normal Slight weakness both flexion and extension at right knee and hip          Assessment & Plan:

## 2013-01-21 ENCOUNTER — Other Ambulatory Visit: Payer: Self-pay | Admitting: *Deleted

## 2013-01-21 ENCOUNTER — Telehealth: Payer: Self-pay

## 2013-01-21 LAB — VITAMIN D 25 HYDROXY (VIT D DEFICIENCY, FRACTURES): Vit D, 25-Hydroxy: 23 ng/mL — ABNORMAL LOW (ref 30–89)

## 2013-01-21 MED ORDER — ERGOCALCIFEROL 1.25 MG (50000 UT) PO CAPS: 50000.0000 [IU] | ORAL_CAPSULE | ORAL | Status: DC

## 2013-01-21 NOTE — Telephone Encounter (Signed)
Pt left v/m; pt seen with back pain on 01/20/13 and wants to know if can get shot in back (? Epidural injection) in Dr Elmer Sow office or will pt need referral.Please advise. Pt request call back.

## 2013-01-21 NOTE — Telephone Encounter (Signed)
Advised patient. She will try to remember who referred her for shots before.

## 2013-01-21 NOTE — Telephone Encounter (Signed)
No we do not do these here.  We would have to refer her to ortho ( I think she already has seen an orthopedist) or pain clinic.

## 2013-01-26 ENCOUNTER — Ambulatory Visit: Payer: Medicare Other | Admitting: Family Medicine

## 2013-01-28 ENCOUNTER — Ambulatory Visit: Payer: Medicare Other | Admitting: Family Medicine

## 2013-01-31 ENCOUNTER — Emergency Department: Payer: Self-pay | Admitting: Internal Medicine

## 2013-02-02 ENCOUNTER — Ambulatory Visit: Payer: Medicare Other | Admitting: Family Medicine

## 2013-02-03 ENCOUNTER — Ambulatory Visit: Payer: Medicare Other | Admitting: Family Medicine

## 2013-02-11 ENCOUNTER — Institutional Professional Consult (permissible substitution): Payer: Medicare Other | Admitting: Pulmonary Disease

## 2013-02-17 ENCOUNTER — Other Ambulatory Visit: Payer: Self-pay | Admitting: Family Medicine

## 2013-02-17 DIAGNOSIS — Z1231 Encounter for screening mammogram for malignant neoplasm of breast: Secondary | ICD-10-CM

## 2013-02-22 ENCOUNTER — Telehealth: Payer: Self-pay

## 2013-02-22 NOTE — Telephone Encounter (Signed)
Pt said both ankles started swelling 02/21/13, today more swelling and soreness in both ankles, redness and warm to touch; Rt ankle the redness is 1-2 " above ankle. No chest pain or SOB. No available appts today pt will go to [facility] on [address]

## 2013-03-07 ENCOUNTER — Encounter: Payer: Self-pay | Admitting: Family Medicine

## 2013-03-07 ENCOUNTER — Ambulatory Visit (INDEPENDENT_AMBULATORY_CARE_PROVIDER_SITE_OTHER)
Admission: RE | Admit: 2013-03-07 | Discharge: 2013-03-07 | Disposition: A | Discharge status: Home / Self Care | Payer: Medicare Other | Source: Ambulatory Visit | Attending: Family Medicine | Admitting: Family Medicine

## 2013-03-07 ENCOUNTER — Ambulatory Visit (INDEPENDENT_AMBULATORY_CARE_PROVIDER_SITE_OTHER): Payer: Medicare Other | Admitting: Family Medicine

## 2013-03-07 VITALS — BP 140/70 | HR 88 | Temp 97.9°F | Wt 179.0 lb

## 2013-03-07 DIAGNOSIS — R0781 Pleurodynia: Secondary | ICD-10-CM

## 2013-03-07 DIAGNOSIS — R600 Localized edema: Secondary | ICD-10-CM

## 2013-03-07 DIAGNOSIS — R071 Chest pain on breathing: Secondary | ICD-10-CM

## 2013-03-07 DIAGNOSIS — R609 Edema, unspecified: Secondary | ICD-10-CM

## 2013-03-07 LAB — BASIC METABOLIC PANEL
BUN: 21 mg/dL (ref 6–23)
CO2: 30 mEq/L (ref 19–32)
Calcium: 9.3 mg/dL (ref 8.4–10.5)
Chloride: 105 mEq/L (ref 96–112)
Creatinine, Ser: 0.8 mg/dL (ref 0.4–1.2)
GFR: 76.3 mL/min (ref >60.00)
Glucose, Bld: 93 mg/dL (ref 70–99)
Potassium: 4.4 mEq/L (ref 3.5–5.1)
Sodium: 139 mEq/L (ref 135–145)

## 2013-03-07 LAB — BRAIN NATRIURETIC PEPTIDE: Pro B Natriuretic peptide (BNP): 60 pg/mL (ref 0.0–100.0)

## 2013-03-07 MED ORDER — HYDROCHLOROTHIAZIDE 12.5 MG PO CAPS: 12.5000 mg | ORAL_CAPSULE | Freq: Every day | ORAL | Status: DC

## 2013-03-07 NOTE — Patient Instructions (Addendum)
Good to see you. We will call you with your xray results.   

## 2013-03-07 NOTE — Progress Notes (Signed)
Subjective:    Patient ID: Molly Chandler, female    DOB: Feb 23, 1935, 78 y.o.   MRN: [id number]  HPI  78 yo pleasant female here with complaint of " 2 weeks of side pain with deep inspirations and cough."  No recent illness.  She has noted that her right ankle is swollen and that she has this bilateral side pain. No worsening cough or SOB. Ankles are always " a little puffy." No fever.  She is a former smoker (quit over 50 years ago).  Patient Active Problem List   Diagnosis Date Noted  . Pleuritic pain 03/07/2013  . Right sided sciatica 01/20/2013  . Cough 11/10/2012  . Fatigue 07/14/2012  . Chronic low back pain 06/25/2012  . Spinal stenosis 04/01/2012  . Left knee pain 11/13/2011  . Right arm pain 11/13/2011  . GERD (gastroesophageal reflux disease) 09/26/2011  . Right lumbar radiculopathy 07/22/2011  . Leg cramps 12/24/2010  . MEMORY LOSS 09/16/2010  . URINARY INCONTINENCE, MIXED 08/23/2010  . LYMPHOMA 05/30/2010  . MOURNING 05/30/2010  . UNSPECIFIED MYALGIA AND MYOSITIS 05/30/2010  . OSTEOPENIA 05/30/2010  . COLON CANCER, HX OF 05/30/2010   Past Medical History  Diagnosis Date  . Cataract   . Osteoporosis     osteopenia  . Lymphoma     rectal cancer   No past surgical history on file. History  Substance Use Topics  . Smoking status: Former Games developer  . Smokeless tobacco: Not on file  . Alcohol Use: No   No family history on file. Allergies  Allergen Reactions  . Amoxicillin     GI intolerance.   . Metoclopramide Hcl    Current Outpatient Prescriptions on File Prior to Visit  Medication Sig Dispense Refill  . Aspirin-Salicylamide-Caffeine (BC HEADACHE POWDER PO) Take by mouth.      . calcium carbonate (OS-CAL) 1250 MG chewable tablet Chew 1 tablet by mouth daily.        . Cholecalciferol (VITAMIN D3) 2000 UNITS capsule Take 2,000 Units by mouth daily.        . Probiotic Product (PROBIOTIC DAILY PO) Take by mouth.      . Travoprost, BAK Free,  (TRAVATAMN) 0.004 % SOLN ophthalmic solution Place 1 drop into both eyes at bedtime.         No current facility-administered medications on file prior to visit.   The PMH, PSH, Social History, Family History, Medications, and allergies have been reviewed in [facility], and have been updated if relevant.   Review of Systems See HPI    Objective:   Physical Exam  Constitutional: She appears well-developed and well-nourished. No distress.  HENT:  Head: Normocephalic.  Cardiovascular: Normal rate and regular rhythm.   Pulmonary/Chest: Effort normal. No respiratory distress. She has no wheezes. She has no rales. She exhibits no tenderness.  Mild right pedal edema, non pitting, both ankles thick (unchanged). BP 140/70  Pulse 88  Temp(Src) 97.9 F (36.6 C)  Wt 179 lb (81.194 kg)  BMI 35.56 kg/m2    Assessment & Plan:  1. Pleuritic pain New- CXR, will also check labs. See below. - DG Chest 2 View; Future  2. Pedal edema  - Basic Metabolic Panel - Brain natriuretic peptide

## 2013-03-07 NOTE — Addendum Note (Signed)
Addended by: Dianne Dun on: 03/07/2013 02:20 PM   Modules accepted: Orders

## 2013-03-18 ENCOUNTER — Encounter: Payer: Self-pay | Admitting: Family Medicine

## 2013-03-18 ENCOUNTER — Ambulatory Visit (INDEPENDENT_AMBULATORY_CARE_PROVIDER_SITE_OTHER): Payer: Medicare Other | Admitting: Family Medicine

## 2013-03-18 VITALS — BP 140/68 | HR 68 | Temp 97.9°F | Wt 180.0 lb

## 2013-03-18 DIAGNOSIS — R609 Edema, unspecified: Secondary | ICD-10-CM

## 2013-03-18 DIAGNOSIS — R600 Localized edema: Secondary | ICD-10-CM

## 2013-03-18 NOTE — Progress Notes (Signed)
Subjective:    Patient ID: Molly Chandler, female    DOB: Jan 24, 1935, 78 y.o.   MRN: [id number]  HPI  78 yo pleasant female here for 2 week follow up of pedal edema.  No recent illness.  She has noted that her right ankle is swollen and that she has this bilateral side pain. No worsening cough or SOB. Ankles are always " a little puffy." No fever.  She is a former smoker (quit over 50 years ago).  Labs unremarkable including BMET, BNP and TSH. Started HCTZ 12.5 mg daily 2 weeks ago.  Took one dose but didn't like how it made her feel- nauseated.  Stopped taking it. Has been keeping feet elevated and swelling improved.  Patient Active Problem List   Diagnosis Date Noted  . Pleuritic pain 03/07/2013  . Right sided sciatica 01/20/2013  . Cough 11/10/2012  . Fatigue 07/14/2012  . Chronic low back pain 06/25/2012  . Spinal stenosis 04/01/2012  . Left knee pain 11/13/2011  . Right arm pain 11/13/2011  . GERD (gastroesophageal reflux disease) 09/26/2011  . Right lumbar radiculopathy 07/22/2011  . Leg cramps 12/24/2010  . MEMORY LOSS 09/16/2010  . URINARY INCONTINENCE, MIXED 08/23/2010  . LYMPHOMA 05/30/2010  . MOURNING 05/30/2010  . UNSPECIFIED MYALGIA AND MYOSITIS 05/30/2010  . OSTEOPENIA 05/30/2010  . COLON CANCER, HX OF 05/30/2010   Past Medical History  Diagnosis Date  . Cataract   . Osteoporosis     osteopenia  . Lymphoma     rectal cancer   No past surgical history on file. History  Substance Use Topics  . Smoking status: Former Games developer  . Smokeless tobacco: Not on file  . Alcohol Use: No   No family history on file. Allergies  Allergen Reactions  . Amoxicillin     GI intolerance.   . Metoclopramide Hcl    Current Outpatient Prescriptions on File Prior to Visit  Medication Sig Dispense Refill  . Aspirin-Salicylamide-Caffeine (BC HEADACHE POWDER PO) Take by mouth.      . calcium carbonate (OS-CAL) 1250 MG chewable tablet Chew 1 tablet by mouth daily.         . Cholecalciferol (VITAMIN D3) 2000 UNITS capsule Take 2,000 Units by mouth daily.        . hydrochlorothiazide (MICROZIDE) 12.5 MG capsule Take 1 capsule (12.5 mg total) by mouth daily.  30 capsule  1  . Probiotic Product (PROBIOTIC DAILY PO) Take by mouth.      . Travoprost, BAK Free, (TRAVATAMN) 0.004 % SOLN ophthalmic solution Place 1 drop into both eyes at bedtime.         No current facility-administered medications on file prior to visit.   The PMH, PSH, Social History, Family History, Medications, and allergies have been reviewed in [facility], and have been updated if relevant.   Review of Systems See HPI    Objective:   Physical Exam  BP 140/68  Pulse 68  Temp(Src) 97.9 F (36.6 C)  Wt 180 lb (81.647 kg)  BMI 35.76 kg/m2  Constitutional: She appears well-developed and well-nourished. No distress.  HENT:  Head: Normocephalic.  Cardiovascular: Normal rate and regular rhythm.   Pulmonary/Chest: Effort normal. No respiratory distress. She has no wheezes. She has no rales. She exhibits no tenderness.  Mild right pedal edema, non pitting, both ankles thick (unchanged).  Assessment & Plan:   1. Pedal edema Likely dependent. Improved with elevation. Continue conservative management. No rx needed. Call or return to clinic prn if  these symptoms worsen or fail to improve as anticipated. The patient indicates understanding of these issues and agrees with the plan.

## 2013-03-23 ENCOUNTER — Ambulatory Visit (HOSPITAL_COMMUNITY): Payer: Medicare Other

## 2013-03-25 ENCOUNTER — Ambulatory Visit (HOSPITAL_COMMUNITY)
Admission: RE | Admit: 2013-03-25 | Discharge: 2013-03-25 | Disposition: A | Discharge status: Home / Self Care | Payer: Medicare Other | Source: Ambulatory Visit | Attending: Family Medicine | Admitting: Family Medicine

## 2013-03-25 DIAGNOSIS — Z1231 Encounter for screening mammogram for malignant neoplasm of breast: Secondary | ICD-10-CM | POA: Insufficient documentation

## 2013-04-01 ENCOUNTER — Other Ambulatory Visit (INDEPENDENT_AMBULATORY_CARE_PROVIDER_SITE_OTHER): Payer: Medicare Other

## 2013-04-01 ENCOUNTER — Encounter: Payer: Self-pay | Admitting: Family Medicine

## 2013-04-01 ENCOUNTER — Ambulatory Visit (INDEPENDENT_AMBULATORY_CARE_PROVIDER_SITE_OTHER): Payer: Medicare Other | Admitting: Family Medicine

## 2013-04-01 VITALS — BP 120/84 | HR 63 | Temp 98.1°F | Ht 59.5 in | Wt 179.0 lb

## 2013-04-01 DIAGNOSIS — E559 Vitamin D deficiency, unspecified: Secondary | ICD-10-CM

## 2013-04-01 DIAGNOSIS — R300 Dysuria: Secondary | ICD-10-CM | POA: Insufficient documentation

## 2013-04-01 LAB — POCT URINALYSIS DIPSTICK
Bilirubin, UA: NEGATIVE
Glucose, UA: NEGATIVE
Ketones, UA: NEGATIVE
Nitrite, UA: NEGATIVE
Spec Grav, UA: 1.02
Urobilinogen, UA: NEGATIVE
pH, UA: 6

## 2013-04-01 LAB — POCT UA - MICROSCOPIC ONLY
Bacteria, U Microscopic: NEGATIVE
Casts, Ur, LPF, POC: NEGATIVE
Crystals, Ur, HPF, POC: NEGATIVE
RBC, urine, microscopic: NEGATIVE
WBC, Ur, HPF, POC: NEGATIVE
Yeast, UA: NEGATIVE

## 2013-04-01 NOTE — Patient Instructions (Addendum)
Increase fluids. Avoid tomatos, citrus,caffeine and alcohol.

## 2013-04-01 NOTE — Assessment & Plan Note (Signed)
No sign of bacterial infeciton... Most like irritant urethritis... Avoid irritants, increase fluids. Call if not improving.

## 2013-04-01 NOTE — Progress Notes (Signed)
Subjective:    Patient ID: Molly Chandler, female    DOB: 06-May-1935, 78 y.o.   MRN: [id number]  Urinary Tract Infection  This is a new problem. The current episode started yesterday. The problem occurs every urination. The problem has been gradually worsening. The quality of the pain is described as burning. There has been no fever. She is not sexually active. There is no history of pyelonephritis. Associated symptoms include hesitancy. Pertinent negatives include no chills, discharge, flank pain, frequency, hematuria, nausea, urgency or vomiting. She has tried nothing for the symptoms. Her past medical history is significant for recurrent UTIs. There is no history of catheterization, kidney stones, a single kidney, urinary stasis or a urological procedure.      Review of Systems  Constitutional: Negative for chills.  Gastrointestinal: Negative for nausea and vomiting.  Genitourinary: Positive for hesitancy. Negative for urgency, frequency, hematuria and flank pain.       Objective:   Physical Exam  Constitutional: Vital signs are normal. She appears well-developed and well-nourished. She is cooperative.  Non-toxic appearance. She does not appear ill. No distress.  HENT:  Head: Normocephalic.  Right Ear: Hearing, tympanic membrane, external ear and ear canal normal. Tympanic membrane is not erythematous, not retracted and not bulging.  Left Ear: Hearing, tympanic membrane, external ear and ear canal normal. Tympanic membrane is not erythematous, not retracted and not bulging.  Nose: No mucosal edema or rhinorrhea. Right sinus exhibits no maxillary sinus tenderness and no frontal sinus tenderness. Left sinus exhibits no maxillary sinus tenderness and no frontal sinus tenderness.  Mouth/Throat: Uvula is midline, oropharynx is clear and moist and mucous membranes are normal.  Eyes: Conjunctivae, EOM and lids are normal. Pupils are equal, round, and reactive to light. No foreign bodies  found.  Neck: Trachea normal and normal range of motion. Neck supple. Carotid bruit is not present. No mass and no thyromegaly present.  Cardiovascular: Normal rate, regular rhythm, S1 normal, S2 normal, normal heart sounds, intact distal pulses and normal pulses.  Exam reveals no gallop and no friction rub.   No murmur heard. Pulmonary/Chest: Effort normal and breath sounds normal. Not tachypneic. No respiratory distress. She has no decreased breath sounds. She has no wheezes. She has no rhonchi. She has no rales.  Abdominal: Soft. Normal appearance and bowel sounds are normal. There is no tenderness. There is no CVA tenderness.  Neurological: She is alert.  Skin: Skin is warm, dry and intact. No rash noted.  Psychiatric: Her speech is normal and behavior is normal. Judgment and thought content normal. Her mood appears not anxious. Cognition and memory are normal. She does not exhibit a depressed mood.          Assessment & Plan:

## 2013-04-02 LAB — VITAMIN D 25 HYDROXY (VIT D DEFICIENCY, FRACTURES): Vit D, 25-Hydroxy: 22 ng/mL — ABNORMAL LOW (ref 30–89)

## 2013-04-04 ENCOUNTER — Other Ambulatory Visit: Payer: Medicare Other

## 2013-04-07 MED ORDER — VITAMIN D (ERGOCALCIFEROL) 1.25 MG (50000 UNIT) PO CAPS: 50000.0000 [IU] | ORAL_CAPSULE | ORAL | Status: AC

## 2013-04-07 NOTE — Addendum Note (Signed)
Addended by: Liane Comber C on: 04/07/2013 11:35 AM   Modules accepted: Orders

## 2013-04-08 ENCOUNTER — Other Ambulatory Visit: Payer: Medicare Other

## 2013-06-09 ENCOUNTER — Other Ambulatory Visit: Payer: Self-pay | Admitting: Family Medicine

## 2013-06-09 DIAGNOSIS — E559 Vitamin D deficiency, unspecified: Secondary | ICD-10-CM

## 2013-06-17 ENCOUNTER — Encounter: Payer: Self-pay | Admitting: Family Medicine

## 2013-06-17 ENCOUNTER — Other Ambulatory Visit (INDEPENDENT_AMBULATORY_CARE_PROVIDER_SITE_OTHER): Payer: Medicare Other

## 2013-06-17 ENCOUNTER — Ambulatory Visit (INDEPENDENT_AMBULATORY_CARE_PROVIDER_SITE_OTHER): Payer: Medicare Other | Admitting: Family Medicine

## 2013-06-17 VITALS — BP 130/78 | HR 78 | Temp 97.8°F | Wt 178.0 lb

## 2013-06-17 DIAGNOSIS — E559 Vitamin D deficiency, unspecified: Secondary | ICD-10-CM

## 2013-06-17 DIAGNOSIS — H9209 Otalgia, unspecified ear: Secondary | ICD-10-CM

## 2013-06-17 DIAGNOSIS — H9201 Otalgia, right ear: Secondary | ICD-10-CM

## 2013-06-17 DIAGNOSIS — R062 Wheezing: Secondary | ICD-10-CM | POA: Insufficient documentation

## 2013-06-17 MED ORDER — ALBUTEROL SULFATE HFA 108 (90 BASE) MCG/ACT IN AERS: 2.0000 | INHALATION_SPRAY | Freq: Four times a day (QID) | RESPIRATORY_TRACT | Status: DC | PRN

## 2013-06-17 MED ORDER — OFLOXACIN 0.3 % OT SOLN: 5.0000 [drp] | Freq: Two times a day (BID) | OTIC | Status: AC

## 2013-06-17 NOTE — Progress Notes (Signed)
Subjective:    Patient ID: Molly Chandler, female    DOB: 1935-06-16, 78 y.o.   MRN: [id number]  HPI Very pleasant 78 yo female here for right ear pain.  Started a few days ago.  She did have a URI last week with cough and congestion.  That has resolved but she is still wheezing.  No fever, CP or SOB. Put peroxide drops in her ears but "nothing came out."  She is a non smoker.  Taking a decongestant daily.  Patient Active Problem List   Diagnosis Date Noted  . Wheezing 06/17/2013  . Dysuria 04/01/2013  . Pedal edema 03/18/2013  . Pleuritic pain 03/07/2013  . Right sided sciatica 01/20/2013  . Cough 11/10/2012  . Fatigue 07/14/2012  . Chronic low back pain 06/25/2012  . Spinal stenosis 04/01/2012  . Left knee pain 11/13/2011  . Right arm pain 11/13/2011  . GERD (gastroesophageal reflux disease) 09/26/2011  . Right lumbar radiculopathy 07/22/2011  . Leg cramps 12/24/2010  . MEMORY LOSS 09/16/2010  . URINARY INCONTINENCE, MIXED 08/23/2010  . LYMPHOMA 05/30/2010  . MOURNING 05/30/2010  . UNSPECIFIED MYALGIA AND MYOSITIS 05/30/2010  . OSTEOPENIA 05/30/2010  . COLON CANCER, HX OF 05/30/2010   Past Medical History  Diagnosis Date  . Cataract   . Osteoporosis     osteopenia  . Lymphoma     rectal cancer   No past surgical history on file. History  Substance Use Topics  . Smoking status: Former Games developer  . Smokeless tobacco: Not on file  . Alcohol Use: No   No family history on file. Allergies  Allergen Reactions  . Amoxicillin     GI intolerance.   . Metoclopramide Hcl    Current Outpatient Prescriptions on File Prior to Visit  Medication Sig Dispense Refill  . Aspirin-Salicylamide-Caffeine (BC HEADACHE POWDER PO) Take by mouth.      . calcium carbonate (OS-CAL) 1250 MG chewable tablet Chew 1 tablet by mouth daily.        . Cholecalciferol (VITAMIN D3) 2000 UNITS capsule Take 2,000 Units by mouth daily.        . Probiotic Product (PROBIOTIC DAILY PO) Take by  mouth.      . Travoprost, BAK Free, (TRAVATAMN) 0.004 % SOLN ophthalmic solution Place 1 drop into both eyes at bedtime.         No current facility-administered medications on file prior to visit.   The PMH, PSH, Social History, Family History, Medications, and allergies have been reviewed in [facility], and have been updated if relevant.    Review of Systems See HPI    Objective:   Physical Exam BP 130/78  Pulse 78  Temp(Src) 97.8 F (36.6 C) (Oral)  Wt 178 lb (80.74 kg)  BMI 35.36 kg/m2  SpO2 96% Gen:  Alert, pleasant, NAD HEENT: Right ear:  Tragus TTP with some erythema in canal- canal is torturous and difficult to visualize on both sides.  No cerumen Tm appears normal Resp:  Diffuse scattered exp wheezes, otherwise clear.  No increased WOB. Psych:  Good eye contact.  Not anxious or depressed appearing.    Assessment & Plan:  1. Otalgia, right Consistent with otitis externa. Will treat with floxin drops x 1 week. Call or return to clinic prn if these symptoms worsen or fail to improve as anticipated. The patient indicates understanding of these issues and agrees with the plan.   2. Wheezing I suspect a post viral bronchitis or inflammation given that her exam  is otherwise benign and she feels ok. Prn albuterol. Call or return to clinic prn if these symptoms worsen or fail to improve as anticipated.

## 2013-06-17 NOTE — Patient Instructions (Addendum)
Good to see you. Place your antibiotic (floxin) drops in your right ear twice daily x 1 week. Use albuterol as needed for wheezing. Call me if no improvement.

## 2013-06-18 LAB — VITAMIN D 25 HYDROXY (VIT D DEFICIENCY, FRACTURES): Vit D, 25-Hydroxy: 32 ng/mL (ref 30–89)

## 2013-07-22 ENCOUNTER — Ambulatory Visit (INDEPENDENT_AMBULATORY_CARE_PROVIDER_SITE_OTHER): Payer: Medicare Other | Admitting: Internal Medicine

## 2013-07-22 ENCOUNTER — Encounter: Payer: Self-pay | Admitting: Internal Medicine

## 2013-07-22 VITALS — BP 118/66 | HR 74 | Temp 98.1°F | Ht 59.75 in | Wt 176.8 lb

## 2013-07-22 DIAGNOSIS — M25572 Pain in left ankle and joints of left foot: Secondary | ICD-10-CM

## 2013-07-22 DIAGNOSIS — M25579 Pain in unspecified ankle and joints of unspecified foot: Secondary | ICD-10-CM

## 2013-07-22 DIAGNOSIS — M25473 Effusion, unspecified ankle: Secondary | ICD-10-CM

## 2013-07-22 DIAGNOSIS — M25472 Effusion, left ankle: Secondary | ICD-10-CM

## 2013-07-22 NOTE — Patient Instructions (Signed)
Ankle Exercises EXERCISES RANGE OF MOTION (ROM) AND STRETCHING EXERCISES These exercises may help you when beginning to rehabilitate your injury. Your symptoms may resolve with or without further involvement from your physician, physical therapist or athletic trainer. While completing these exercises, remember:   Restoring tissue flexibility helps normal motion to return to the joints. This allows healthier, less painful movement and activity.  An effective stretch should be held for at least 30 seconds.  A stretch should never be painful. You should only feel a gentle lengthening or release in the stretched tissue. RANGE OF MOTION - Dorsi/Plantar Flexion  While sitting with your right / left knee straight, draw the top of your foot upwards by flexing your ankle. Then reverse the motion, pointing your toes downward.  Hold each position for __________ seconds.  After completing your first set of exercises, repeat this exercise with your knee bent. Repeat __________ times. Complete this exercise __________ times per day.  RANGE OF MOTION - Ankle Alphabet  Imagine your right / left big toe is a pen.  Keeping your hip and knee still, write out the entire alphabet with your "pen." Make the letters as large as you can without increasing any discomfort. Repeat __________ times. Complete this exercise __________ times per day.  RANGE OF MOTION - Ankle Dorsiflexion, Active Assisted   Remove shoes and sit on a chair that is preferably not on a carpeted surface.  Place right / left foot under knee. Extend your opposite leg for support.  Keeping your heel down, slide your right / left foot back toward the chair until you feel a stretch at your ankle or calf. If you do not feel a stretch, slide your bottom forward to the edge of the chair while still keeping your heel down.  Hold this stretch for __________ seconds. Repeat __________ times. Complete this stretch __________ times per day.    STRENGTHENING EXERCISES  These exercises may help you when beginning to rehabilitate your injury. They may resolve your symptoms with or without further involvement from your physician, physical therapist or athletic trainer. While completing these exercises, remember:   Muscles can gain both the endurance and the strength needed for everyday activities through controlled exercises.  Complete these exercises as instructed by your physician, physical therapist or athletic trainer. Progress the resistance and repetitions only as guided.  You may experience muscle soreness or fatigue, but the pain or discomfort you are trying to eliminate should never worsen during these exercises. If this pain does worsen, stop and make certain you are following the directions exactly. If the pain is still present after adjustments, discontinue the exercise until you can discuss the trouble with your clinician. STRENGTH - Dorsiflexors  Secure a rubber exercise band/tubing to a fixed object (table, pole) and loop the other end around your right / left foot.  Sit on the floor facing the fixed object. The band/tubing should be slightly tense when your foot is relaxed.  Slowly draw your foot back toward you using your ankle and toes.  Hold this position for __________ seconds. Slowly release the tension in the band and return your foot to the starting position. Repeat __________ times. Complete this exercise __________ times per day.  STRENGTH - Plantar-flexors  Sit with your right / left leg extended. Holding onto both ends of a rubber exercise band/tubing, loop it around the ball of your foot. Keep a slight tension in the band.  Slowly push your toes away from you, pointing them   downward.  Hold this position for __________ seconds. Return slowly, controlling the tension in the band/tubing. Repeat __________ times. Complete this exercise __________ times per day.  STRENGTH - Ankle Eversion  Secure one end of  a rubber exercise band/tubing to a fixed object (table, pole). Loop the other end around your foot just before your toes.  Place your fists between your knees. This will focus your strengthening at your ankle.  Drawing the band/tubing across your opposite foot, slowly, pull your little toe out and up. Make sure the band/tubing is positioned to resist the entire motion.  Hold this position for __________ seconds.  Have your muscles resist the band/tubing as it slowly pulls your foot back to the starting position. Repeat __________ times. Complete this exercise __________ times per day.  STRENGTH - Ankle Inversion  Secure one end of a rubber exercise band/tubing to a fixed object (table, pole). Loop the other end around your foot just before your toes.  Place your fists between your knees. This will focus your strengthening at your ankle.  Slowly, pull your big toe up and in, making sure the band/tubing is positioned to resist the entire motion.  Hold this position for __________ seconds.  Have your muscles resist the band/tubing as it slowly pulls your foot back to the starting position. Repeat __________ times. Complete this exercises __________ times per day.  STRENGTH - Towel Curls  Sit in a chair positioned on a non-carpeted surface.  Place your foot on a towel, keeping your heel on the floor.  Pull the towel toward your heel by only curling your toes. Keep your heel on the floor. If instructed by your physician, physical therapist or athletic trainer, add weight to the end of the towel. Repeat __________ times. Complete this exercise __________ times per day. STRENGTH - Plantar-flexors, Standing  Stand with your feet shoulder width apart. Steady yourself with a wall or table using as little support as needed.  Keeping your weight evenly spread over the width of your feet, rise up on your toes.*  Hold this position for __________ seconds. Repeat __________ times. Complete  this exercise __________ times per day.  *If this is too easy, shift your weight toward your right / left leg until you feel challenged. Ultimately, you may be asked to do this exercise with your right / left foot only. BALANCE  Tandem Walking  Place your uninjured foot on a line 2-4 inches wide and at least 10 feet long.  Keeping your balance without using anything for extra support, place your right / left heel directly in front of your other foot.  Slowly raise your back foot up, lifting from the heel to the toes, and place it directly in front of the right / left foot.  Continue to walk along the line slowly. Walk for ____________________ feet. Repeat ____________________ times. Complete ____________________ times per day. Document Released: 07/09/2005 Document Revised: 11/17/2011 Document Reviewed: 12/07/2008 [employer] Patient Information 2014 [employer].  

## 2013-07-22 NOTE — Progress Notes (Signed)
HPI: Pt presents today with complaints of bilateral ankle pain and swelling, right greater than left. Pt states the swelling started months ago, but has progressively gotten worse. Pt denies shortness of breath, difficulty with gait, dizziness, or weakness. Pt has tried many home remedies such as soaking feet in warm epison salt, vick vapor rub, and heat and ice therapy, with some relief.   Past Medical History  Diagnosis Date  . Cataract   . Osteoporosis     osteopenia  . Lymphoma     rectal cancer    Current Outpatient Prescriptions  Medication Sig Dispense Refill  . Aspirin-Salicylamide-Caffeine (BC HEADACHE POWDER PO) Take by mouth.      . calcium carbonate (OS-CAL) 1250 MG chewable tablet Chew 1 tablet by mouth daily.        . Cholecalciferol (VITAMIN D3) 2000 UNITS capsule Take 2,000 Units by mouth daily.        . Probiotic Product (PROBIOTIC DAILY PO) Take by mouth.      . Travoprost, BAK Free, (TRAVATAMN) 0.004 % SOLN ophthalmic solution Place 1 drop into both eyes at bedtime.        Marland Kitchen albuterol (PROVENTIL HFA;VENTOLIN HFA) 108 (90 BASE) MCG/ACT inhaler Inhale 2 puffs into the lungs every 6 (six) hours as needed for wheezing.  1 Inhaler  0   No current facility-administered medications for this visit.    Allergies  Allergen Reactions  . Amoxicillin     GI intolerance.   . Metoclopramide Hcl     History reviewed. No pertinent family history.  History   Social History  . Marital Status: Widowed    Spouse Name: N/A    Number of Children: 5  . Years of Education: N/A   Occupational History  . Retired    Social History Main Topics  . Smoking status: Former Games developer  . Smokeless tobacco: Not on file  . Alcohol Use: No  . Drug Use: Not on file  . Sexual Activity: Not on file   Other Topics Concern  . Not on file   Social History Narrative  . No narrative on file    ROS:  Constitutional: Denies fever, malaise, fatigue, headache or abrupt weight changes.   Respiratory: Denies difficulty breathing, shortness of breath, cough or sputum production.   Cardiovascular: Denies chest pain, chest tightness, palpitations or swelling in the hands or feet.  Musculoskeletal:Endorses bilateral ankle pain and joint swelling. Denies decrease in range of motion or difficulty with gait, Skin: Denies redness, rashes, lesions or ulcercations.  Neurological: Denies dizziness, difficulty with memory, difficulty with speech or problems with balance and coordination.   No other specific complaints in a complete review of systems (except as listed in HPI above).  PE:  BP 118/66  Pulse 74  Temp(Src) 98.1 F (36.7 C) (Oral)  Ht 4' 11.75" (1.518 m)  Wt 176 lb 12.8 oz (80.196 kg)  BMI 34.80 kg/m2  SpO2 98% Wt Readings from Last 3 Encounters:  07/22/13 176 lb 12.8 oz (80.196 kg)  06/17/13 178 lb (80.74 kg)  04/01/13 179 lb (81.194 kg)    General: Appears their stated age, well developed, well nourished in NAD. Cardiovascular: Normal rate and rhythm. S1,S2 noted.  No murmur, rubs or gallops noted. No JVD or BLE edema. No carotid bruits noted. Pulmonary/Chest: Normal effort and positive vesicular breath sounds. No respiratory distress. No wheezes, rales or ronchi noted. . Musculoskeletal: Normal range of motion. Bilateral ankle swelling to anterior/lateral surface. Areas upon palpation fluid  filled, non-mobile and tender.No redness or warmth noted. No difficulty with gait.  Neurological: Alert and oriented. Cranial nerves II-XII intact. Coordination normal. +DTRs bilaterally. Psychiatric: Mood and affect normal. Behavior is normal. Judgment and thought content normal.    Assessment and Plan: Ankle swelling Recommended trying compression hose, pt states she has some at home Ultrasound referred to exam fluid; will call with appointment Follow up post ultrasound for further examination  Robyn Galati, Jacques Earthly, Student-NP

## 2013-07-22 NOTE — Progress Notes (Signed)
Subjective:    Patient ID: Molly Chandler, female    DOB: May 13, 1935, 78 y.o.   MRN: [id number]  HPI  Pt presents to the clinic today with c/o left ankle pain, swelling and warmth. This started a few months ago. She reports that the areas are very tender to touch. She has tried ice/heat, epsom salt, vicks vapor rub. They swelling has not changed. She does have compression hose at home but does not where them often. She denies any specific injury to the area.  Review of Systems      Past Medical History  Diagnosis Date  . Cataract   . Osteoporosis     osteopenia  . Lymphoma     rectal cancer    Current Outpatient Prescriptions  Medication Sig Dispense Refill  . Aspirin-Salicylamide-Caffeine (BC HEADACHE POWDER PO) Take by mouth.      . calcium carbonate (OS-CAL) 1250 MG chewable tablet Chew 1 tablet by mouth daily.        . Cholecalciferol (VITAMIN D3) 2000 UNITS capsule Take 2,000 Units by mouth daily.        . Probiotic Product (PROBIOTIC DAILY PO) Take by mouth.      . Travoprost, BAK Free, (TRAVATAMN) 0.004 % SOLN ophthalmic solution Place 1 drop into both eyes at bedtime.        Marland Kitchen albuterol (PROVENTIL HFA;VENTOLIN HFA) 108 (90 BASE) MCG/ACT inhaler Inhale 2 puffs into the lungs every 6 (six) hours as needed for wheezing.  1 Inhaler  0   No current facility-administered medications for this visit.    Allergies  Allergen Reactions  . Amoxicillin     GI intolerance.   . Metoclopramide Hcl     History reviewed. No pertinent family history.  History   Social History  . Marital Status: Widowed    Spouse Name: N/A    Number of Children: 5  . Years of Education: N/A   Occupational History  . Retired    Social History Main Topics  . Smoking status: Former Games developer  . Smokeless tobacco: Not on file  . Alcohol Use: No  . Drug Use:   . Sexual Activity:    Other Topics Concern  . Not on file   Social History Narrative  . No narrative on file      Constitutional: Denies fever, malaise, fatigue, headache or abrupt weight changes.  Musculoskeletal:  Denies decrease in range of motion, muscle pain, difficulty with gait.  Skin: Denies redness, rashes, lesions or ulcercations.    No other specific complaints in a complete review of systems (except as listed in HPI above).  Objective:   Physical Exam   BP 118/66  Pulse 74  Temp(Src) 98.1 F (36.7 C) (Oral)  Ht 4' 11.75" (1.518 m)  Wt 176 lb 12.8 oz (80.196 kg)  BMI 34.80 kg/m2  SpO2 98% Wt Readings from Last 3 Encounters:  07/22/13 176 lb 12.8 oz (80.196 kg)  06/17/13 178 lb (80.74 kg)  04/01/13 179 lb (81.194 kg)    General: Appears her stated age, well developed, well nourished in NAD. Skin: Warm, dry and intact. No rashes, lesions or ulcerations noted. Cardiovascular: Normal rate and rhythm. S1,S2 noted.  No murmur, rubs or gallops noted. No JVD. Small pockets of fluid noted on bilateral anterior medial ankle and posterior medial ankle, tender to touch. No carotid bruits noted. Pulmonary/Chest: Normal effort and positive vesicular breath sounds. No respiratory distress. No wheezes, rales or ronchi noted.  Musculoskeletal: Normal range of  motion. No signs of joint swelling. No difficulty with gait.    BMET    Component Value Date/Time   NA 139 03/07/2013 1306   K 4.4 03/07/2013 1306   CL 105 03/07/2013 1306   CO2 30 03/07/2013 1306   GLUCOSE 93 03/07/2013 1306   BUN 21 03/07/2013 1306   CREATININE 0.8 03/07/2013 1306   CREATININE 0.66 08/01/2011 1536   CALCIUM 9.3 03/07/2013 1306   GFRNONAA 108.21 05/30/2010 1501    Lipid Panel     Component Value Date/Time   CHOL 217* 05/30/2010 1501   TRIG 137.0 05/30/2010 1501   HDL 43.80 05/30/2010 1501   CHOLHDL 5 05/30/2010 1501   VLDL 27.4 05/30/2010 1501    CBC    Component Value Date/Time   WBC 5.2 11/10/2012 1131   WBC 6.8 10/21/2010 0856   WBC 4.0 12/23/2007 0811   RBC 4.14 11/10/2012 1131   RBC 3.98 12/23/2007 0811    HGB 12.6 11/10/2012 1131   HGB 13.3 10/21/2010 0856   HGB 12.6 12/23/2007 0811   HCT 37.7 11/10/2012 1131   HCT 40.0 10/21/2010 0856   HCT 36.0 12/23/2007 0811   PLT 202.0 11/10/2012 1131   PLT 241 10/21/2010 0856   PLT 208 12/23/2007 0811   MCV 91.0 11/10/2012 1131   MCV 93 10/21/2010 0856   MCV 90.5 12/23/2007 0811   MCH 30.8 10/21/2010 0856   MCH 31.8 12/23/2007 0811   MCHC 33.4 11/10/2012 1131   MCHC 33.2 10/21/2010 0856   MCHC 35.2 12/23/2007 0811   RDW 13.6 11/10/2012 1131   RDW 11.9 10/21/2010 0856   RDW 13.4 12/23/2007 0811   LYMPHSABS 1.5 11/10/2012 1131   LYMPHSABS 1.1 10/21/2010 0856   LYMPHSABS 1.1 12/23/2007 0811   MONOABS 0.4 11/10/2012 1131   MONOABS 0.4 12/23/2007 0811   EOSABS 0.1 11/10/2012 1131   EOSABS 0.1 10/21/2010 0856   EOSABS 0.1 12/23/2007 0811   BASOSABS 0.0 11/10/2012 1131   BASOSABS 0.0 10/21/2010 0856   BASOSABS 0.0 12/23/2007 0811    Hgb A1C No results found for this basename: HGBA1C        Assessment & Plan:   Left ankle pain, swelling:  Will obtain ultrasound of fluid pockets to identify what they are Go home and put on your compression stockings for now If ultrasound normal, refer to vein specialist  Will follow up after ultrasound results

## 2013-07-25 ENCOUNTER — Ambulatory Visit: Payer: Self-pay | Admitting: Internal Medicine

## 2013-07-28 ENCOUNTER — Telehealth: Payer: Self-pay

## 2013-07-28 ENCOUNTER — Encounter: Payer: Self-pay | Admitting: Internal Medicine

## 2013-07-28 NOTE — Telephone Encounter (Signed)
Noted, as soon as I see it I will give her a call

## 2013-07-28 NOTE — Telephone Encounter (Signed)
Pt left v/m requesting results from [place] of lt ankle done on 07/22/13; could not find results in pts chart; spoke with Rosey Bath at [facility] [place] and will fax report to [phone] now. Revonda Standard at front desk will put report on [employer] when fax comes in.

## 2013-07-28 NOTE — Telephone Encounter (Signed)
Have not received fax from [facility]; spoke with Molly Chandler at [facility] [place] and she will manually fax [place] results.

## 2013-07-29 ENCOUNTER — Telehealth: Payer: Self-pay

## 2013-07-29 NOTE — Telephone Encounter (Signed)
Pt called back for [place] results; cannot go into result note left by Shapele CMA; pt notified as instructed by result note; pt said has been trying to keep legs elevated and wearing compression stocking; not hurting as bad but pt still has puffy area on lt leg. Pt wants Nicki Reaper to know how she is doing now.

## 2013-07-29 NOTE — Telephone Encounter (Signed)
Noted thank you

## 2013-08-02 NOTE — Telephone Encounter (Signed)
See 07/29/13 note.

## 2013-08-09 ENCOUNTER — Emergency Department: Payer: Self-pay | Admitting: Emergency Medicine

## 2013-09-14 LAB — HM DIABETES FOOT EXAM: HM Diabetic Foot Exam: NORMAL

## 2013-10-12 ENCOUNTER — Encounter: Payer: Self-pay | Admitting: Family Medicine

## 2013-11-24 ENCOUNTER — Encounter: Payer: Self-pay | Admitting: Internal Medicine

## 2013-11-24 ENCOUNTER — Ambulatory Visit (INDEPENDENT_AMBULATORY_CARE_PROVIDER_SITE_OTHER): Payer: Medicare Other | Admitting: Internal Medicine

## 2013-11-24 VITALS — BP 118/68 | HR 74 | Temp 97.9°F | Wt 174.5 lb

## 2013-11-24 DIAGNOSIS — R300 Dysuria: Secondary | ICD-10-CM

## 2013-11-24 DIAGNOSIS — M5430 Sciatica, unspecified side: Secondary | ICD-10-CM

## 2013-11-24 DIAGNOSIS — N3090 Cystitis, unspecified without hematuria: Secondary | ICD-10-CM

## 2013-11-24 LAB — POCT URINALYSIS DIPSTICK
Bilirubin, UA: NEGATIVE
Glucose, UA: NEGATIVE
Ketones, UA: NEGATIVE
Nitrite, UA: NEGATIVE
Protein, UA: NEGATIVE
Spec Grav, UA: 1.02
Urobilinogen, UA: 0.2
pH, UA: 6

## 2013-11-24 MED ORDER — METHYLPREDNISOLONE ACETATE 40 MG/ML IJ SUSP
80.0000 mg | Freq: Once | INTRAMUSCULAR | Status: AC
  Administered 2013-11-24: 80 mg via INTRAMUSCULAR

## 2013-11-24 NOTE — Patient Instructions (Addendum)
Back Pain, Adult Low back pain is very common. About 1 in 5 people have back pain.The cause of low back pain is rarely dangerous. The pain often gets better over time.About half of people with a sudden onset of back pain feel better in just 2 weeks. About 8 in 10 people feel better by 6 weeks.  CAUSES Some common causes of back pain include:  Strain of the muscles or ligaments supporting the spine.  Wear and tear (degeneration) of the spinal discs.  Arthritis.  Direct injury to the back. DIAGNOSIS Most of the time, the direct cause of low back pain is not known.However, back pain can be treated effectively even when the exact cause of the pain is unknown.Answering your caregiver's questions about your overall health and symptoms is one of the most accurate ways to make sure the cause of your pain is not dangerous. If your caregiver needs more information, he or she may order lab work or imaging tests (X-rays or MRIs).However, even if imaging tests show changes in your back, this usually does not require surgery. HOME CARE INSTRUCTIONS For many people, back pain returns.Since low back pain is rarely dangerous, it is often a condition that people can learn to manageon their own.   Remain active. It is stressful on the back to sit or stand in one place. Do not sit, drive, or stand in one place for more than 30 minutes at a time. Take short walks on level surfaces as soon as pain allows.Try to increase the length of time you walk each day.  Do not stay in bed.Resting more than 1 or 2 days can delay your recovery.  Do not avoid exercise or work.Your body is made to move.It is not dangerous to be active, even though your back may hurt.Your back will likely heal faster if you return to being active before your pain is gone.  Pay attention to your body when you bend and lift. Many people have less discomfortwhen lifting if they bend their knees, keep the load close to their bodies,and  avoid twisting. Often, the most comfortable positions are those that put less stress on your recovering back.  Find a comfortable position to sleep. Use a firm mattress and lie on your side with your knees slightly bent. If you lie on your back, put a pillow under your knees.  Only take over-the-counter or prescription medicines as directed by your caregiver. Over-the-counter medicines to reduce pain and inflammation are often the most helpful.Your caregiver may prescribe muscle relaxant drugs.These medicines help dull your pain so you can more quickly return to your normal activities and healthy exercise.  Put ice on the injured area.  Put ice in a plastic bag.  Place a towel between your skin and the bag.  Leave the ice on for 15-20 minutes, 03-04 times a day for the first 2 to 3 days. After that, ice and heat may be alternated to reduce pain and spasms.  Ask your caregiver about trying back exercises and gentle massage. This may be of some benefit.  Avoid feeling anxious or stressed.Stress increases muscle tension and can worsen back pain.It is important to recognize when you are anxious or stressed and learn ways to manage it.Exercise is a great option. SEEK MEDICAL CARE IF:  You have pain that is not relieved with rest or medicine.  You have pain that does not improve in 1 week.  You have new symptoms.  You are generally not feeling well. SEEK   IMMEDIATE MEDICAL CARE IF:   You have pain that radiates from your back into your legs.  You develop new bowel or bladder control problems.  You have unusual weakness or numbness in your arms or legs.  You develop nausea or vomiting.  You develop abdominal pain.  You feel faint. Document Released: 08/25/2005 Document Revised: 02/24/2012 Document Reviewed: 01/13/2011 [facility] Patient Information 2014 [place], [place]. Back Pain, Adult Low back pain is very common. About 1 in 5 people have back pain.The cause of low back pain  is rarely dangerous. The pain often gets better over time.About half of people with a sudden onset of back pain feel better in just 2 weeks. About 8 in 10 people feel better by 6 weeks.  CAUSES Some common causes of back pain include:  Strain of the muscles or ligaments supporting the spine.  Wear and tear (degeneration) of the spinal discs.  Arthritis.  Direct injury to the back. DIAGNOSIS Most of the time, the direct cause of low back pain is not known.However, back pain can be treated effectively even when the exact cause of the pain is unknown.Answering your caregiver's questions about your overall health and symptoms is one of the most accurate ways to make sure the cause of your pain is not dangerous. If your caregiver needs more information, he or she may order lab work or imaging tests (X-rays or MRIs).However, even if imaging tests show changes in your back, this usually does not require surgery. HOME CARE INSTRUCTIONS For many people, back pain returns.Since low back pain is rarely dangerous, it is often a condition that people can learn to [facility] their own.   Remain active. It is stressful on the back to sit or stand in one place. Do not sit, drive, or stand in one place for more than 30 minutes at a time. Take short walks on level surfaces as soon as pain allows.Try to increase the length of time you walk each day.  Do not stay in bed.Resting more than 1 or 2 days can delay your recovery.  Do not avoid exercise or work.Your body is made to move.It is not dangerous to be active, even though your back may hurt.Your back will likely heal faster if you return to being active before your pain is gone.  Pay attention to your body when you bend and lift. Many people have less discomfortwhen lifting if they bend their knees, keep the load close to their bodies,and avoid twisting. Often, the most comfortable positions are those that put less stress on your recovering  back.  Find a comfortable position to sleep. Use a firm mattress and lie on your side with your knees slightly bent. If you lie on your back, put a pillow under your knees.  Only take over-the-counter or prescription medicines as directed by your caregiver. Over-the-counter medicines to reduce pain and inflammation are often the most helpful.Your caregiver may prescribe muscle relaxant drugs.These medicines help dull your pain so you can more quickly return to your normal activities and healthy exercise.  Put ice on the injured area.  Put ice in a plastic bag.  Place a towel between your skin and the bag.  Leave the ice on for 15-20 minutes, 03-04 times a day for the first 2 to 3 days. After that, ice and heat may be alternated to reduce pain and spasms.  Ask your caregiver about trying back exercises and gentle massage. This may be of some benefit.  Avoid feeling anxious or  stressed.Stress increases muscle tension and can worsen back pain.It is important to recognize when you are anxious or stressed and learn ways to manage it.Exercise is a great option. SEEK MEDICAL CARE IF:  You have pain that is not relieved with rest or medicine.  You have pain that does not improve in 1 week.  You have new symptoms.  You are generally not feeling well. SEEK IMMEDIATE MEDICAL CARE IF:   You have pain that radiates from your back into your legs.  You develop new bowel or bladder control problems.  You have unusual weakness or numbness in your arms or legs.  You develop nausea or vomiting.  You develop abdominal pain.  You feel faint. Document Released: 08/25/2005 Document Revised: 02/24/2012 Document Reviewed: 01/13/2011 [facility] Patient Information 2014 [place], [place].

## 2013-11-24 NOTE — Addendum Note (Signed)
Addended by: Lindalou Hose Y on: 11/24/2013 05:00 PM   Modules accepted: Orders

## 2013-11-24 NOTE — Progress Notes (Signed)
Subjective:    Patient ID: Molly Chandler, female    DOB: Mar 16, 1935, 80 y.o.   MRN: [id number]  HPI  Pt presents to the clinic today with c/o bilateral leg pain. This started 3 years ago. She describes the pain as a burning/tingling sensation. She does have a history of chronic low back pain and spinal stenosis. She has been seen at the hospital twice for this in the past year. She has been taking mustard and vinegar at home. The flexeril makes her too sleepy.  Additionally, she c/o urgency and frequency. She reports this started 1 week ago. She denies dysuria. She denies fever, chills or body aches.   Review of Systems      Past Medical History  Diagnosis Date  . Cataract   . Osteoporosis     osteopenia  . Lymphoma     rectal cancer    Current Outpatient Prescriptions  Medication Sig Dispense Refill  . Aspirin-Salicylamide-Caffeine (BC HEADACHE POWDER PO) Take by mouth.      . calcium carbonate (OS-CAL) 1250 MG chewable tablet Chew 1 tablet by mouth daily.        . Cholecalciferol (VITAMIN D3) 2000 UNITS capsule Take 2,000 Units by mouth daily.        . Probiotic Product (PROBIOTIC DAILY PO) Take by mouth.      . Travoprost, BAK Free, (TRAVATAMN) 0.004 % SOLN ophthalmic solution Place 1 drop into both eyes at bedtime.         No current facility-administered medications for this visit.    Allergies  Allergen Reactions  . Amoxicillin     GI intolerance.   . Metoclopramide Hcl     History reviewed. No pertinent family history.  History   Social History  . Marital Status: Widowed    Spouse Name: N/A    Number of Children: 5  . Years of Education: N/A   Occupational History  . Retired    Social History Main Topics  . Smoking status: Former Games developer  . Smokeless tobacco: Not on file  . Alcohol Use: No  . Drug Use: Not on file  . Sexual Activity: Not on file   Other Topics Concern  . Not on file   Social History Narrative  . No narrative on file      Constitutional: Denies fever, malaise, fatigue, headache or abrupt weight changes.  GU: Pt reports urgency and frequency. Denies pain with urination, burning sensation, blood in urine, odor or discharge. Musculoskeletal: Denies decrease in range of motion, difficulty with gait, muscle pain or joint pain and swelling.  Skin: Denies redness, rashes, lesions or ulcercations.  Neurological: Denies dizziness, difficulty with memory, difficulty with speech or problems with balance and coordination.   No other specific complaints in a complete review of systems (except as listed in HPI above).  Objective:   Physical Exam   BP 118/68  Pulse 74  Temp(Src) 97.9 F (36.6 C) (Oral)  Wt 174 lb 8 oz (79.153 kg)  SpO2 97% Wt Readings from Last 3 Encounters:  11/24/13 174 lb 8 oz (79.153 kg)  07/22/13 176 lb 12.8 oz (80.196 kg)  06/17/13 178 lb (80.74 kg)    General: Appears her stated age, well developed, well nourished in NAD.  Cardiovascular: Normal rate and rhythm. S1,S2 noted.  No murmur, rubs or gallops noted. No JVD or BLE edema. No carotid bruits noted. Pulmonary/Chest: Normal effort and positive vesicular breath sounds. No respiratory distress. No wheezes, rales or ronchi  noted.  Abdomen: Soft and nontender. Normal bowel sounds, no bruits noted. No distention or masses noted. Liver, spleen and kidneys non palpable. No CVA tenderness. Musculoskeletal: Normal flexion, extension and rotation of the back. Strength 5/5 BLE. Positive straight leg raise.    BMET    Component Value Date/Time   NA 139 03/07/2013 1306   K 4.4 03/07/2013 1306   CL 105 03/07/2013 1306   CO2 30 03/07/2013 1306   GLUCOSE 93 03/07/2013 1306   BUN 21 03/07/2013 1306   CREATININE 0.8 03/07/2013 1306   CREATININE 0.66 08/01/2011 1536   CALCIUM 9.3 03/07/2013 1306   GFRNONAA 108.21 05/30/2010 1501    Lipid Panel     Component Value Date/Time   CHOL 217* 05/30/2010 1501   TRIG 137.0 05/30/2010 1501   HDL 43.80  05/30/2010 1501   CHOLHDL 5 05/30/2010 1501   VLDL 27.4 05/30/2010 1501    CBC    Component Value Date/Time   WBC 5.2 11/10/2012 1131   WBC 6.8 10/21/2010 0856   WBC 4.0 12/23/2007 0811   RBC 4.14 11/10/2012 1131   RBC 3.98 12/23/2007 0811   HGB 12.6 11/10/2012 1131   HGB 13.3 10/21/2010 0856   HGB 12.6 12/23/2007 0811   HCT 37.7 11/10/2012 1131   HCT 40.0 10/21/2010 0856   HCT 36.0 12/23/2007 0811   PLT 202.0 11/10/2012 1131   PLT 241 10/21/2010 0856   PLT 208 12/23/2007 0811   MCV 91.0 11/10/2012 1131   MCV 93 10/21/2010 0856   MCV 90.5 12/23/2007 0811   MCH 30.8 10/21/2010 0856   MCH 31.8 12/23/2007 0811   MCHC 33.4 11/10/2012 1131   MCHC 33.2 10/21/2010 0856   MCHC 35.2 12/23/2007 0811   RDW 13.6 11/10/2012 1131   RDW 11.9 10/21/2010 0856   RDW 13.4 12/23/2007 0811   LYMPHSABS 1.5 11/10/2012 1131   LYMPHSABS 1.1 10/21/2010 0856   LYMPHSABS 1.1 12/23/2007 0811   MONOABS 0.4 11/10/2012 1131   MONOABS 0.4 12/23/2007 0811   EOSABS 0.1 11/10/2012 1131   EOSABS 0.1 10/21/2010 0856   EOSABS 0.1 12/23/2007 0811   BASOSABS 0.0 11/10/2012 1131   BASOSABS 0.0 10/21/2010 0856   BASOSABS 0.0 12/23/2007 0811    Hgb A1C No results found for this basename: HGBA1C         Assessment & Plan:   Cystitis:   Urinalysis: trace leuks and trace blood Increase your fluid intake to flush the bladder Will hold off on abx at this time  Sciatica:  Appears chronic Has an ortho but has not visited him lately She reports she can not take prednisone as it makes her sick on her stomach Will give 80 mg Depo IM today F/u with ortho if it continues to bother you  RTC as needed or if symptoms persist or worsen

## 2013-11-24 NOTE — Progress Notes (Signed)
Pre visit review using our clinic review tool, if applicable. No additional management support is needed unless otherwise documented below in the visit note. 

## 2013-12-23 ENCOUNTER — Telehealth: Payer: Self-pay | Admitting: Family Medicine

## 2013-12-23 NOTE — Telephone Encounter (Signed)
Needs to evaluated for this.

## 2013-12-23 NOTE — Telephone Encounter (Signed)
Pt called requesting something for cramp in legs, thighs, calves, and feet. Pt has tried home remedies such as mustard, drinking vinegar and nothing is helping. Pt uses [employer]. Please advise.

## 2013-12-23 NOTE — Telephone Encounter (Signed)
Lm on pts vm informing her to contact office to schedule appt

## 2013-12-26 ENCOUNTER — Ambulatory Visit (INDEPENDENT_AMBULATORY_CARE_PROVIDER_SITE_OTHER): Payer: Medicare Other | Admitting: Family Medicine

## 2013-12-26 ENCOUNTER — Encounter: Payer: Self-pay | Admitting: Family Medicine

## 2013-12-26 VITALS — BP 128/72 | HR 61 | Temp 97.5°F | Ht 59.0 in | Wt 173.0 lb

## 2013-12-26 DIAGNOSIS — M79672 Pain in left foot: Secondary | ICD-10-CM

## 2013-12-26 DIAGNOSIS — R252 Cramp and spasm: Secondary | ICD-10-CM

## 2013-12-26 DIAGNOSIS — M79609 Pain in unspecified limb: Secondary | ICD-10-CM

## 2013-12-26 DIAGNOSIS — M79671 Pain in right foot: Secondary | ICD-10-CM

## 2013-12-26 LAB — BASIC METABOLIC PANEL
BUN: 18 mg/dL (ref 6–23)
CO2: 29 mEq/L (ref 19–32)
Calcium: 9.2 mg/dL (ref 8.4–10.5)
Chloride: 104 mEq/L (ref 96–112)
Creatinine, Ser: 0.5 mg/dL (ref 0.4–1.2)
GFR: 118.92 mL/min (ref >60.00)
Glucose, Bld: 93 mg/dL (ref 70–99)
Potassium: 4.2 mEq/L (ref 3.5–5.1)
Sodium: 140 mEq/L (ref 135–145)

## 2013-12-26 MED ORDER — GABAPENTIN 300 MG PO CAPS: 300.0000 mg | ORAL_CAPSULE | Freq: Two times a day (BID) | ORAL | Status: DC

## 2013-12-26 NOTE — Assessment & Plan Note (Signed)
Start Gabapentin 300 mg twice daily (titrate this up- see AVS). Refer to podiatry. Check electrolytes today.  Orders Placed This Encounter  Procedures  . Basic Metabolic Panel  . Ambulatory referral to Podiatry

## 2013-12-26 NOTE — Progress Notes (Signed)
Pre visit review using our clinic review tool, if applicable. No additional management support is needed unless otherwise documented below in the visit note. 

## 2013-12-26 NOTE — Patient Instructions (Signed)
I will call you with your lab results. Let's start gabapentin 300 mg at bedtime.  If you tolerate this ok, you can increase this to 300 mg twice daily.  Please stop by to see Rosaria Ferries on your way out to set up your foot doctor referral.

## 2013-12-26 NOTE — Progress Notes (Signed)
Subjective:   Patient ID: Molly Chandler, female    DOB: August 25, 1935, 79 y.o.   MRN: [id number]  Molly Chandler is a pleasant 79 y.o. year old female with h/o spinal stenosis who presents to clinic today with leg cramps  on 12/26/2013  HPI: Cramping actually in her feet- left> right. Pain is mainly in ball of foot when she walks.  + tingling in toes for months. Taking gabapentin 100 mg qhs without improvement (it is her daughter's medication).  Wearing foot inserts without improvement.  Lab Results  Component Value Date   NA 139 03/07/2013   K 4.4 03/07/2013   CL 105 03/07/2013   CO2 30 03/07/2013   Patient Active Problem List   Diagnosis Date Noted  . Foot cramps 12/26/2013  . Right sided sciatica 01/20/2013  . Chronic low back pain 06/25/2012  . Spinal stenosis 04/01/2012  . GERD (gastroesophageal reflux disease) 09/26/2011  . MEMORY LOSS 09/16/2010  . URINARY INCONTINENCE, MIXED 08/23/2010  . LYMPHOMA 05/30/2010  . OSTEOPENIA 05/30/2010  . COLON CANCER, HX OF 05/30/2010   Past Medical History  Diagnosis Date  . Cataract   . Osteoporosis     osteopenia  . Lymphoma     rectal cancer   No past surgical history on file. History  Substance Use Topics  . Smoking status: Former Games developer  . Smokeless tobacco: Not on file  . Alcohol Use: No   No family history on file. Allergies  Allergen Reactions  . Amoxicillin     GI intolerance.   . Metoclopramide Hcl    Current Outpatient Prescriptions on File Prior to Visit  Medication Sig Dispense Refill  . Aspirin-Salicylamide-Caffeine (BC HEADACHE POWDER PO) Take by mouth.      . calcium carbonate (OS-CAL) 1250 MG chewable tablet Chew 1 tablet by mouth daily.        . Cholecalciferol (VITAMIN D3) 2000 UNITS capsule Take 2,000 Units by mouth daily.        . Probiotic Product (PROBIOTIC DAILY PO) Take by mouth.      . Travoprost, BAK Free, (TRAVATAMN) 0.004 % SOLN ophthalmic solution Place 1 drop into both eyes at  bedtime.         No current facility-administered medications on file prior to visit.   The PMH, PSH, Social History, Family History, Medications, and allergies have been reviewed in [facility], and have been updated if relevant.   Review of Systems    See HPI +when stands up,  "feels foot got run over." Denies LE weakness No back pain  Objective:    BP 128/72  Pulse 61  Temp(Src) 97.5 F (36.4 C) (Oral)  Ht 4\' 11"  (1.499 m)  Wt 173 lb (78.472 kg)  BMI 34.92 kg/m2  SpO2 97%   Physical Exam  Gen:  Alert, NAD Foot exam: Normal inspection No skin breakdown No calluses  Normal DP pulses Normal sensation to light touch and monofilament Nails normal       Assessment & Plan:   Foot cramps - Plan: Ambulatory referral to Podiatry No Follow-up on file.

## 2013-12-29 ENCOUNTER — Telehealth: Payer: Self-pay

## 2013-12-29 NOTE — Telephone Encounter (Signed)
Pt called requesting lab results of 12/26/13. Pt notified electrolytes were normal per result note.(could not get into result note to record.)

## 2014-01-12 ENCOUNTER — Ambulatory Visit (INDEPENDENT_AMBULATORY_CARE_PROVIDER_SITE_OTHER): Payer: Medicare Other

## 2014-01-12 ENCOUNTER — Ambulatory Visit (INDEPENDENT_AMBULATORY_CARE_PROVIDER_SITE_OTHER): Payer: Medicare Other | Admitting: Podiatry

## 2014-01-12 ENCOUNTER — Encounter: Payer: Self-pay | Admitting: Podiatry

## 2014-01-12 VITALS — BP 157/82 | HR 59 | Resp 16

## 2014-01-12 DIAGNOSIS — M7750 Other enthesopathy of unspecified foot: Secondary | ICD-10-CM

## 2014-01-12 DIAGNOSIS — M779 Enthesopathy, unspecified: Secondary | ICD-10-CM

## 2014-01-12 MED ORDER — TRIAMCINOLONE ACETONIDE 10 MG/ML IJ SUSP
10.0000 mg | Freq: Once | INTRAMUSCULAR | Status: AC
  Administered 2014-01-12: 10 mg

## 2014-01-12 NOTE — Progress Notes (Signed)
Subjective:    Patient ID: Molly Chandler, female    DOB: Feb 05, 1935, 79 y.o.   MRN: [id number]  HPI Comments: "I have pains in my toes"  Patient c/o sharp, shooting pains in toes and forefoot for few months. She can't be up walking long before they start to hurt. She has been checked for tumors and blood clots. Her feet swell. She wears compression stockings-makes pain worse. Also, tried new shoes-no help.     Review of Systems  HENT: Positive for sinus pressure.   Cardiovascular: Positive for leg swelling.       Calf pain with walking   Endocrine: Positive for polyuria.  Musculoskeletal: Positive for arthralgias, back pain and myalgias.  Neurological: Positive for numbness.  All other systems reviewed and are negative.      Objective:   Physical Exam        Assessment & Plan:

## 2014-01-13 ENCOUNTER — Telehealth: Payer: Self-pay

## 2014-01-13 NOTE — Progress Notes (Signed)
Subjective:     Patient ID: Molly Chandler, female   DOB: Feb 24, 1935, 79 y.o.   MRN: [id number]  Foot Pain   patient presents stating she is getting pain in her arch and also pain in her forefoot of both feet right over left with inflammation around the second metatarsal joint. States that it's been an ongoing issue   Review of Systems  All other systems reviewed and are negative.      Objective:   Physical Exam  Nursing note and vitals reviewed. Constitutional: She is oriented to person, place, and time.  Cardiovascular: Intact distal pulses.   Musculoskeletal: Normal range of motion.  Neurological: She is oriented to person, place, and time.  Skin: Skin is dry.   neurovascular status mildly diminished on both feet with range of motion of the subtalar and midtarsal joint found to be adequate and muscle strength adequate. Patient is found to have diminished Fill time to the digits are well perfused and I noted on the lateral side of the second metatarsal right there is inflammation and pain when pressed     Assessment:     Probable capsulitis with inflammatory fasciitis right over left foot    Plan:     H&P and x-rays reviewed and today I did a careful injection of the right second MPJ lateral side 3 mg Kenalog 5 mg Xylocaine Marcaine mixture and applied fascially brace with the arch and instructed on reduced activity. Reappoint for [place] to recheck again in the next several weeks

## 2014-01-13 NOTE — Telephone Encounter (Signed)
Spoke to pt and advised per Dr Aron. Pt verbally expressed understanding.  

## 2014-01-13 NOTE — Telephone Encounter (Signed)
Quinine is actually not safe.  I would recommend OTC vit E and or Vit B complex- both OTC.

## 2014-01-13 NOTE — Telephone Encounter (Signed)
Lm on pts vm requesting a call back 

## 2014-01-13 NOTE — Telephone Encounter (Signed)
Pt request med for legs cramping at night; pt wanted to know if Quinine would help her leg cramps. [place]. Pt request cb.

## 2014-01-26 ENCOUNTER — Encounter: Payer: Self-pay | Admitting: Podiatry

## 2014-01-26 ENCOUNTER — Ambulatory Visit (INDEPENDENT_AMBULATORY_CARE_PROVIDER_SITE_OTHER): Payer: BC Managed Care – PPO | Admitting: Podiatry

## 2014-01-26 VITALS — BP 146/56 | HR 76 | Resp 16

## 2014-01-26 DIAGNOSIS — M7750 Other enthesopathy of unspecified foot: Secondary | ICD-10-CM | POA: Diagnosis not present

## 2014-01-26 DIAGNOSIS — M779 Enthesopathy, unspecified: Secondary | ICD-10-CM

## 2014-01-26 NOTE — Progress Notes (Signed)
Subjective:     Patient ID: Molly Chandler, female   DOB: [birth year], 79 y.o.   MRN: [id number]  HPI patient presents with pain in both of her feet that did not respond to previous injections I done and states that she cannot take any medicines as she cannot tolerate any oral medicines   Review of Systems     Objective:   Physical Exam Neurovascular status intact with continuation of forefoot where foot discomfort on both feet that is diffuse in nature    Assessment:     Chronic tendinitis inflammation with probable fibromyalgia of both feet    Plan:     Reviewed condition and recommended orthotics of a very soft nature to try to reduce pressure against feet. Scanned for custom orthotics

## 2014-02-17 ENCOUNTER — Ambulatory Visit (INDEPENDENT_AMBULATORY_CARE_PROVIDER_SITE_OTHER): Payer: Medicare Other | Admitting: *Deleted

## 2014-02-17 DIAGNOSIS — M779 Enthesopathy, unspecified: Secondary | ICD-10-CM

## 2014-02-17 NOTE — Progress Notes (Signed)
Subjective:    Patient ID: Molly Chandler, female    DOB: 01-05-1935, 79 y.o.   MRN: [id number]  HPI  PICK UP ORTHOTICS AND GIVEN INSTRUCTION.    Review of Systems     Objective:   Physical Exam        Assessment & Plan:

## 2014-02-17 NOTE — Patient Instructions (Signed)

## 2014-02-21 ENCOUNTER — Other Ambulatory Visit: Payer: Self-pay | Admitting: Family Medicine

## 2014-02-21 DIAGNOSIS — Z1231 Encounter for screening mammogram for malignant neoplasm of breast: Secondary | ICD-10-CM

## 2014-02-23 ENCOUNTER — Ambulatory Visit: Payer: BC Managed Care – PPO | Admitting: Podiatry

## 2014-02-23 ENCOUNTER — Encounter: Payer: Self-pay | Admitting: Podiatry

## 2014-02-23 VITALS — BP 135/62 | HR 69 | Resp 16

## 2014-02-23 DIAGNOSIS — M7750 Other enthesopathy of unspecified foot: Secondary | ICD-10-CM

## 2014-02-23 DIAGNOSIS — G589 Mononeuropathy, unspecified: Secondary | ICD-10-CM

## 2014-02-24 NOTE — Progress Notes (Signed)
Subjective:     Patient ID: Molly Chandler, female   DOB: 13-May-1935, 79 y.o.   MRN: [id number]  HPI patient states that she really likes her orthotics but she still is getting burning pain in the forefoot of both feet   Review of Systems     Objective:   Physical Exam Neurovascular status intact with no health history changes noted and patient has moderate distal discomfort from the metatarsal heads in a distal direction    Assessment:     Neuropathic-like symptomatology still present bilateral    Plan:     Discussed possibility in the future for neurogenic strata treatment and we will begin neuro remedy at this time

## 2014-02-27 ENCOUNTER — Ambulatory Visit (HOSPITAL_COMMUNITY): Payer: Medicare Other

## 2014-03-29 ENCOUNTER — Ambulatory Visit (HOSPITAL_COMMUNITY)
Admission: RE | Admit: 2014-03-29 | Discharge: 2014-03-29 | Disposition: A | Discharge status: Home / Self Care | Payer: Medicare Other | Source: Ambulatory Visit | Attending: Family Medicine | Admitting: Family Medicine

## 2014-03-29 DIAGNOSIS — Z1231 Encounter for screening mammogram for malignant neoplasm of breast: Secondary | ICD-10-CM | POA: Insufficient documentation

## 2014-04-27 ENCOUNTER — Ambulatory Visit (INDEPENDENT_AMBULATORY_CARE_PROVIDER_SITE_OTHER): Payer: Medicare Other | Admitting: Internal Medicine

## 2014-04-27 ENCOUNTER — Encounter: Payer: Self-pay | Admitting: Internal Medicine

## 2014-04-27 VITALS — BP 136/88 | HR 58 | Temp 98.0°F | Ht 59.0 in | Wt 175.0 lb

## 2014-04-27 DIAGNOSIS — Z23 Encounter for immunization: Secondary | ICD-10-CM

## 2014-04-27 DIAGNOSIS — Z2911 Encounter for prophylactic immunotherapy for respiratory syncytial virus (RSV): Secondary | ICD-10-CM

## 2014-04-27 DIAGNOSIS — Z7901 Long term (current) use of anticoagulants: Secondary | ICD-10-CM | POA: Diagnosis not present

## 2014-04-27 DIAGNOSIS — Z79899 Other long term (current) drug therapy: Secondary | ICD-10-CM

## 2014-04-27 DIAGNOSIS — Z0000 Encounter for general adult medical examination without abnormal findings: Secondary | ICD-10-CM

## 2014-04-27 NOTE — Progress Notes (Signed)
Pre visit review using our clinic review tool, if applicable. No additional management support is needed unless otherwise documented below in the visit note. 

## 2014-04-27 NOTE — Patient Instructions (Addendum)

## 2014-04-27 NOTE — Addendum Note (Signed)
Addended by: Lurlean Nanny on: 04/27/2014 04:03 PM   Modules accepted: Orders

## 2014-04-27 NOTE — Progress Notes (Signed)
Subjective:    Patient ID: Molly Chandler, female    DOB: 1934-10-17, 79 y.o.   MRN: [id number]  HPI  Pt presents to the clinic today for her annual physical. She has no concerns today.  Flu: 05/2013 Tetanus: 2013 Pneumovax: unsure Zostovax: never Pap Smear: 2013 Mammogram: 03/2014 Bone Density: 2013 Colon Screening: 2009 Eye Doctor: yearly Dentist: biannually  Review of Systems      Past Medical History  Diagnosis Date  . Cataract   . Osteoporosis     osteopenia  . Lymphoma     rectal cancer    Current Outpatient Prescriptions  Medication Sig Dispense Refill  . Aspirin-Salicylamide-Caffeine (BC HEADACHE POWDER PO) Take by mouth.      . Benfotiamine 150 MG CAPS Take 1 capsule by mouth daily.      . Cholecalciferol (VITAMIN D3) 2000 UNITS capsule Take 2,000 Units by mouth daily.        . Multiple Vitamin (MULTIVITAMIN) tablet Take 1 tablet by mouth daily.      . naproxen (NAPROSYN) 500 MG tablet Take 500 mg by mouth at bedtime.      . Probiotic Product (PROBIOTIC DAILY PO) Take by mouth.      . Travoprost, BAK Free, (TRAVATAMN) 0.004 % SOLN ophthalmic solution Place 1 drop into both eyes at bedtime.         No current facility-administered medications for this visit.    Allergies  Allergen Reactions  . Amoxicillin     GI intolerance.   . Metoclopramide Hcl     History reviewed. No pertinent family history.  History   Social History  . Marital Status: Widowed    Spouse Name: N/A    Number of Children: 5  . Years of Education: N/A   Occupational History  . Retired    Social History Main Topics  . Smoking status: Former Games developer  . Smokeless tobacco: Not on file  . Alcohol Use: No  . Drug Use: Not on file  . Sexual Activity: Not on file   Other Topics Concern  . Not on file   Social History Narrative  . No narrative on file     Constitutional: Denies fever, malaise, fatigue, headache or abrupt weight changes.  HEENT: Denies eye pain, eye  redness, ear pain, ringing in the ears, wax buildup, runny nose, nasal congestion, bloody nose, or sore throat. Respiratory: Denies difficulty breathing, shortness of breath, cough or sputum production.   Cardiovascular: Denies chest pain, chest tightness, palpitations or swelling in the hands or feet.  Gastrointestinal: Denies abdominal pain, bloating, constipation, diarrhea or blood in the stool.  GU: Denies urgency, frequency, pain with urination, burning sensation, blood in urine, odor or discharge. Musculoskeletal: Denies decrease in range of motion, difficulty with gait, muscle pain or joint pain and swelling.  Skin: Denies redness, rashes, lesions or ulcercations.  Neurological: Denies dizziness, difficulty with memory, difficulty with speech or problems with balance and coordination.   No other specific complaints in a complete review of systems (except as listed in HPI above).  Objective:   Physical Exam   BP 136/88  Pulse 58  Temp(Src) 98 F (36.7 C) (Oral)  Ht 4\' 11"  (1.499 m)  Wt 175 lb (79.379 kg)  BMI 35.33 kg/m2  SpO2 98% Wt Readings from Last 3 Encounters:  04/27/14 175 lb (79.379 kg)  12/26/13 173 lb (78.472 kg)  11/24/13 174 lb 8 oz (79.153 kg)    General: Appears her stated age, well  developed, well nourished in NAD. HEENT: Head: normal shape and size; Eyes: sclera white, no icterus, conjunctiva pink, PERRLA and EOMs intact; Ears: Tm's gray and intact, normal light reflex; Nose: mucosa pink and moist, septum midline; Throat/Mouth: Teeth present, mucosa pink and moist, no exudate, lesions or ulcerations noted.  Cardiovascular: Normal rate and rhythm. S1,S2 noted.  No murmur, rubs or gallops noted. No JVD or BLE edema. No carotid bruits noted. Pulmonary/Chest: Normal effort and positive vesicular breath sounds. No respiratory distress. No wheezes, rales or ronchi noted.  Abdomen: Soft and nontender. Normal bowel sounds, no bruits noted. No distention or masses noted.  Liver, spleen and kidneys non palpable. Neurological: Alert and oriented. Cranial nerves II-XII grossly intact.  Psychiatric: Mood and affect normal. Behavior is normal. Judgment and thought content normal.     BMET    Component Value Date/Time   NA 140 12/26/2013 1506   K 4.2 12/26/2013 1506   CL 104 12/26/2013 1506   CO2 29 12/26/2013 1506   GLUCOSE 93 12/26/2013 1506   BUN 18 12/26/2013 1506   CREATININE 0.5 12/26/2013 1506   CREATININE 0.66 08/01/2011 1536   CALCIUM 9.2 12/26/2013 1506   GFRNONAA 108.21 05/30/2010 1501    Lipid Panel     Component Value Date/Time   CHOL 217* 05/30/2010 1501   TRIG 137.0 05/30/2010 1501   HDL 43.80 05/30/2010 1501   CHOLHDL 5 05/30/2010 1501   VLDL 27.4 05/30/2010 1501    CBC    Component Value Date/Time   WBC 5.2 11/10/2012 1131   WBC 6.8 10/21/2010 0856   WBC 4.0 12/23/2007 0811   RBC 4.14 11/10/2012 1131   RBC 4.31 10/21/2010 0856   RBC 3.98 12/23/2007 0811   HGB 12.6 11/10/2012 1131   HGB 13.3 10/21/2010 0856   HGB 12.6 12/23/2007 0811   HCT 37.7 11/10/2012 1131   HCT 40.0 10/21/2010 0856   HCT 36.0 12/23/2007 0811   PLT 202.0 11/10/2012 1131   PLT 241 10/21/2010 0856   PLT 208 12/23/2007 0811   MCV 91.0 11/10/2012 1131   MCV 93 10/21/2010 0856   MCV 90.5 12/23/2007 0811   MCH 30.8 10/21/2010 0856   MCH 31.8 12/23/2007 0811   MCHC 33.4 11/10/2012 1131   MCHC 33.2 10/21/2010 0856   MCHC 35.2 12/23/2007 0811   RDW 13.6 11/10/2012 1131   RDW 11.9 10/21/2010 0856   RDW 13.4 12/23/2007 0811   LYMPHSABS 1.5 11/10/2012 1131   LYMPHSABS 1.1 10/21/2010 0856   LYMPHSABS 1.1 12/23/2007 0811   MONOABS 0.4 11/10/2012 1131   MONOABS 0.4 12/23/2007 0811   EOSABS 0.1 11/10/2012 1131   EOSABS 0.1 10/21/2010 0856   EOSABS 0.1 12/23/2007 0811   BASOSABS 0.0 11/10/2012 1131   BASOSABS 0.0 10/21/2010 0856   BASOSABS 0.0 12/23/2007 0811    Hgb A1C No results found for this basename: HGBA1C        Assessment & Plan:   Preventative Health Maintenance:  Zostovax given today Will  see when her pneumovax was given She wants to hold off on her flu vaccine and get it a little later into flu season Encouraged her to work on diet and exercise Will check screening labs today  RTC in 1 year for you annual physical

## 2014-04-28 LAB — COMPREHENSIVE METABOLIC PANEL
ALT: 14 U/L (ref 0–35)
AST: 20 U/L (ref 0–37)
Albumin: 3.8 g/dL (ref 3.5–5.2)
Alkaline Phosphatase: 52 U/L (ref 39–117)
BUN: 18 mg/dL (ref 6–23)
CO2: 28 mEq/L (ref 19–32)
Calcium: 9.5 mg/dL (ref 8.4–10.5)
Chloride: 105 mEq/L (ref 96–112)
Creatinine, Ser: 0.6 mg/dL (ref 0.4–1.2)
GFR: 101.02 mL/min (ref >60.00)
Glucose, Bld: 87 mg/dL (ref 70–99)
Potassium: 4.5 mEq/L (ref 3.5–5.1)
Sodium: 140 mEq/L (ref 135–145)
Total Bilirubin: 0.7 mg/dL (ref 0.2–1.2)
Total Protein: 6.7 g/dL (ref 6.0–8.3)

## 2014-04-28 LAB — CBC
HCT: 36.3 % (ref 36.0–46.0)
Hemoglobin: 12.1 g/dL (ref 12.0–15.0)
MCHC: 33.4 g/dL (ref 30.0–36.0)
MCV: 93.5 fl (ref 78.0–100.0)
Platelets: 186 10*3/uL (ref 150.0–400.0)
RBC: 3.88 Mil/uL (ref 3.87–5.11)
RDW: 13.4 % (ref 11.5–15.5)
WBC: 5.6 10*3/uL (ref 4.0–10.5)

## 2014-04-28 LAB — LIPID PANEL
Cholesterol: 196 mg/dL (ref 0–200)
HDL: 45.6 mg/dL (ref >39.00)
LDL Cholesterol: 140 mg/dL — ABNORMAL HIGH (ref 0–99)
NonHDL: 150.4
Total CHOL/HDL Ratio: 4
Triglycerides: 50 mg/dL (ref 0.0–149.0)
VLDL: 10 mg/dL (ref 0.0–40.0)

## 2014-05-22 ENCOUNTER — Encounter: Payer: Self-pay | Admitting: Internal Medicine

## 2014-05-22 ENCOUNTER — Ambulatory Visit (INDEPENDENT_AMBULATORY_CARE_PROVIDER_SITE_OTHER): Payer: Medicare Other | Admitting: Internal Medicine

## 2014-05-22 VITALS — BP 130/66 | HR 68 | Temp 98.2°F | Wt 179.0 lb

## 2014-05-22 DIAGNOSIS — L02419 Cutaneous abscess of limb, unspecified: Secondary | ICD-10-CM

## 2014-05-22 DIAGNOSIS — IMO0002 Reserved for concepts with insufficient information to code with codable children: Secondary | ICD-10-CM

## 2014-05-22 DIAGNOSIS — L03119 Cellulitis of unspecified part of limb: Secondary | ICD-10-CM

## 2014-05-22 DIAGNOSIS — W548XXA Other contact with dog, initial encounter: Secondary | ICD-10-CM

## 2014-05-22 DIAGNOSIS — W64XXXA Exposure to other animate mechanical forces, initial encounter: Secondary | ICD-10-CM

## 2014-05-22 MED ORDER — DOXYCYCLINE HYCLATE 100 MG PO TABS: 100.0000 mg | ORAL_TABLET | Freq: Two times a day (BID) | ORAL | Status: DC

## 2014-05-22 MED ORDER — CLINDAMYCIN HCL 300 MG PO CAPS: 300.0000 mg | ORAL_CAPSULE | Freq: Three times a day (TID) | ORAL | Status: DC

## 2014-05-22 NOTE — Progress Notes (Signed)
Pre visit review using our clinic review tool, if applicable. No additional management support is needed unless otherwise documented below in the visit note. 

## 2014-05-22 NOTE — Progress Notes (Signed)
Subjective:    Patient ID: Molly Chandler, female    DOB: 11-Jan-1935, 79 y.o.   MRN: [id number]  HPI  Pt presents to the clinic today with c/o an abrasion to her RLE. She reports this occurred 1 month ago. She was scratched by a dog. The area is red and tender.  It has not drained. She has been washing the area with peroxide and putting neosporin on it without improvement.  Review of Systems      Past Medical History  Diagnosis Date  . Cataract   . Osteoporosis     osteopenia  . Lymphoma     rectal cancer    Current Outpatient Prescriptions  Medication Sig Dispense Refill  . Aspirin-Salicylamide-Caffeine (BC HEADACHE POWDER PO) Take by mouth.      . Benfotiamine 150 MG CAPS Take 1 capsule by mouth daily.      . Cholecalciferol (VITAMIN D3) 2000 UNITS capsule Take 2,000 Units by mouth daily.        . Multiple Vitamin (MULTIVITAMIN) tablet Take 1 tablet by mouth daily.      . naproxen (NAPROSYN) 500 MG tablet Take 500 mg by mouth at bedtime.      . Probiotic Product (PROBIOTIC DAILY PO) Take by mouth.      . Travoprost, BAK Free, (TRAVATAMN) 0.004 % SOLN ophthalmic solution Place 1 drop into both eyes at bedtime.         No current facility-administered medications for this visit.    Allergies  Allergen Reactions  . Amoxicillin     GI intolerance.   . Metoclopramide Hcl     No family history on file.  History   Social History  . Marital Status: Widowed    Spouse Name: N/A    Number of Children: 5  . Years of Education: N/A   Occupational History  . Retired    Social History Main Topics  . Smoking status: Former Games developer  . Smokeless tobacco: Not on file  . Alcohol Use: No  . Drug Use: Not on file  . Sexual Activity: Not on file   Other Topics Concern  . Not on file   Social History Narrative  . No narrative on file     Constitutional: Denies fever, malaise, fatigue, headache or abrupt weight changes.  Skin: Pt reports abrasion to RLE. Denies   rashes, lesions or ulcercations.    No other specific complaints in a complete review of systems (except as listed in HPI above).  Objective:   Physical Exam  BP 130/66  Pulse 68  Temp(Src) 98.2 F (36.8 C) (Oral)  Wt 179 lb (81.194 kg)  SpO2 98% Wt Readings from Last 3 Encounters:  05/22/14 179 lb (81.194 kg)  04/27/14 175 lb (79.379 kg)  12/26/13 173 lb (78.472 kg)    General: Appears her stated age, obese but well developed, well nourished in NAD. Skin: Small round dime size abrasion to RLE. Baseball size area of cellulitis noted around the abrasion, tender to touch. Cardiovascular: Normal rate and rhythm. S1,S2 noted.  No murmur, rubs or gallops noted. . Pulmonary/Chest: Normal effort and positive vesicular breath sounds. No respiratory distress. No wheezes, rales or ronchi noted.    BMET    Component Value Date/Time   NA 140 04/27/2014 1549   K 4.5 04/27/2014 1549   CL 105 04/27/2014 1549   CO2 28 04/27/2014 1549   GLUCOSE 87 04/27/2014 1549   BUN 18 04/27/2014 1549   CREATININE 0.6  04/27/2014 1549   CREATININE 0.66 08/01/2011 1536   CALCIUM 9.5 04/27/2014 1549   GFRNONAA 108.21 05/30/2010 1501    Lipid Panel     Component Value Date/Time   CHOL 196 04/27/2014 1549   TRIG 50.0 04/27/2014 1549   HDL 45.60 04/27/2014 1549   CHOLHDL 4 04/27/2014 1549   VLDL 10.0 04/27/2014 1549   LDLCALC 140* 04/27/2014 1549    CBC    Component Value Date/Time   WBC 5.6 04/27/2014 1549   WBC 6.8 10/21/2010 0856   WBC 4.0 12/23/2007 0811   RBC 3.88 04/27/2014 1549   RBC 4.31 10/21/2010 0856   RBC 3.98 12/23/2007 0811   HGB 12.1 04/27/2014 1549   HGB 13.3 10/21/2010 0856   HGB 12.6 12/23/2007 0811   HCT 36.3 04/27/2014 1549   HCT 40.0 10/21/2010 0856   HCT 36.0 12/23/2007 0811   PLT 186.0 04/27/2014 1549   PLT 241 10/21/2010 0856   PLT 208 12/23/2007 0811   MCV 93.5 04/27/2014 1549   MCV 93 10/21/2010 0856   MCV 90.5 12/23/2007 0811   MCH 30.8 10/21/2010 0856   MCH 31.8 12/23/2007 0811   MCHC  33.4 04/27/2014 1549   MCHC 33.2 10/21/2010 0856   MCHC 35.2 12/23/2007 0811   RDW 13.4 04/27/2014 1549   RDW 11.9 10/21/2010 0856   RDW 13.4 12/23/2007 0811   LYMPHSABS 1.5 11/10/2012 1131   LYMPHSABS 1.1 10/21/2010 0856   LYMPHSABS 1.1 12/23/2007 0811   MONOABS 0.4 11/10/2012 1131   MONOABS 0.4 12/23/2007 0811   EOSABS 0.1 11/10/2012 1131   EOSABS 0.1 10/21/2010 0856   EOSABS 0.1 12/23/2007 0811   BASOSABS 0.0 11/10/2012 1131   BASOSABS 0.0 10/21/2010 0856   BASOSABS 0.0 12/23/2007 0811    Hgb A1C No results found for this basename: HGBA1C         Assessment & Plan:   Cellulitis RLE d/t dog scratch:  Amoxil allergy Will cover with doxycyline BID and clindamycin TID x 10 days Watch for increased redness, pain, fever or drainage OK to put neosporin on the area  RTC as needed or if symptoms persist or worsen

## 2014-05-22 NOTE — Patient Instructions (Addendum)

## 2014-06-30 ENCOUNTER — Telehealth: Payer: Self-pay

## 2014-06-30 ENCOUNTER — Telehealth: Payer: Self-pay | Admitting: Family Medicine

## 2014-06-30 NOTE — Telephone Encounter (Signed)
Patient Information:  Caller Name: Gurtha  Phone: [phone]  Patient: Molly Chandler  Gender: Female  DOB: Jan 25, 1935  Age: 79 Years  PCP: Arnette Norris [facility])  Office Follow Up:  Does the office need to follow up with this patient?: No  Instructions For The Office: N/A  RN Note: Offered appt for today at [facility] but refusing stating that she can not come until after 3:00pm; offered appt for Saturday and she is refusing; pt requesting appt for Monday even after much encouragement to be seen today; office called and made aware; MD instructed that if pt is refusing appt for today and tomorrow, pt will need to be seen in UC today; pt made aware and will go to UC this evening  Symptoms  Reason For Call & Symptoms: Pt is calling and states that she is having urinary urgency with painful urination;  Reviewed Health History In EMR: Yes  Reviewed Medications In EMR: Yes  Reviewed Allergies In EMR: Yes  Reviewed Surgeries / Procedures: Yes  Date of Onset of Symptoms: 06/29/2014  Treatments Tried: Azo standard  Treatments Tried Worked: No  Guideline(s) Used:  Urination Pain - Female  Disposition Per Guideline:   Go to Office Now  Reason For Disposition Reached:   Severe pain with urination  Advice Given:  N/A  Patient Will Follow Care Advice:  YES

## 2014-06-30 NOTE — Telephone Encounter (Signed)
Aware- recommend UC  Will cc to PCP

## 2014-06-30 NOTE — Telephone Encounter (Signed)
Molly Chandler with [facility] said pt has had burning and pain upon urination with urgency; pt has tried AZO with no relief. Pt was offered appt at [facility] at 2 PM and Sat Clinic and pt refused;needs appt after 3PM. No available appts at [facility] or [place] and no additional add ons. Pt can go to UC; [place] voiced understanding.

## 2014-06-30 NOTE — Telephone Encounter (Signed)
Aware , will cc to PCP

## 2014-07-22 ENCOUNTER — Emergency Department: Payer: Self-pay | Admitting: Emergency Medicine

## 2014-07-22 LAB — URINALYSIS, COMPLETE
Bacteria: NONE SEEN
Bilirubin,UR: NEGATIVE
Glucose,UR: NEGATIVE mg/dL (ref 0–75)
Ketone: NEGATIVE
Nitrite: NEGATIVE
Ph: 5 (ref 4.5–8.0)
Protein: 30
RBC,UR: 92 /HPF (ref 0–5)
Specific Gravity: 1.017 (ref 1.003–1.030)
Squamous Epithelial: 3
WBC UR: 2433 /HPF (ref 0–5)

## 2014-07-24 LAB — URINE CULTURE

## 2014-08-05 ENCOUNTER — Emergency Department: Payer: Self-pay | Admitting: Emergency Medicine

## 2014-08-05 LAB — URINALYSIS, COMPLETE
Bacteria: NONE SEEN
Bilirubin,UR: NEGATIVE
Blood: NEGATIVE
Glucose,UR: NEGATIVE mg/dL (ref 0–75)
Ketone: NEGATIVE
Leukocyte Esterase: NEGATIVE
Nitrite: NEGATIVE
Ph: 7 (ref 4.5–8.0)
Protein: NEGATIVE
RBC,UR: <1 /HPF (ref 0–5)
Specific Gravity: 1.014 (ref 1.003–1.030)
Squamous Epithelial: <1
WBC UR: 1 /HPF (ref 0–5)

## 2014-08-05 LAB — CBC WITH DIFFERENTIAL/PLATELET
Basophil #: 0.1 10*3/uL (ref 0.0–0.1)
Basophil %: 0.7 %
Eosinophil #: 0.1 10*3/uL (ref 0.0–0.7)
Eosinophil %: 1.7 %
HCT: 38.1 % (ref 35.0–47.0)
HGB: 12.3 g/dL (ref 12.0–16.0)
Lymphocyte #: 2.1 10*3/uL (ref 1.0–3.6)
Lymphocyte %: 27.5 %
MCH: 30.5 pg (ref 26.0–34.0)
MCHC: 32.2 g/dL (ref 32.0–36.0)
MCV: 95 fL (ref 80–100)
Monocyte #: 0.5 x10 3/mm (ref 0.2–0.9)
Monocyte %: 6.9 %
Neutrophil #: 4.7 10*3/uL (ref 1.4–6.5)
Neutrophil %: 63.2 %
Platelet: 212 10*3/uL (ref 150–440)
RBC: 4.03 10*6/uL (ref 3.80–5.20)
RDW: 13.7 % (ref 11.5–14.5)
WBC: 7.5 10*3/uL (ref 3.6–11.0)

## 2014-08-05 LAB — BASIC METABOLIC PANEL
Anion Gap: 5 — ABNORMAL LOW (ref 7–16)
BUN: 19 mg/dL — ABNORMAL HIGH (ref 7–18)
Calcium, Total: 9.1 mg/dL (ref 8.5–10.1)
Chloride: 104 mmol/L (ref 98–107)
Co2: 29 mmol/L (ref 21–32)
Creatinine: 0.72 mg/dL (ref 0.60–1.30)
EGFR (African American): >60
EGFR (Non-African Amer.): >60
Glucose: 89 mg/dL (ref 65–99)
Osmolality: 277 (ref 275–301)
Potassium: 3.7 mmol/L (ref 3.5–5.1)
Sodium: 138 mmol/L (ref 136–145)

## 2014-08-05 LAB — TROPONIN I: Troponin-I: <0.02 ng/mL

## 2014-08-11 ENCOUNTER — Ambulatory Visit (INDEPENDENT_AMBULATORY_CARE_PROVIDER_SITE_OTHER): Payer: Medicare Other | Admitting: Internal Medicine

## 2014-08-11 ENCOUNTER — Encounter: Payer: Self-pay | Admitting: Internal Medicine

## 2014-08-11 VITALS — BP 118/62 | HR 77 | Temp 98.0°F | Wt 174.0 lb

## 2014-08-11 DIAGNOSIS — R42 Dizziness and giddiness: Secondary | ICD-10-CM

## 2014-08-11 DIAGNOSIS — N898 Other specified noninflammatory disorders of vagina: Secondary | ICD-10-CM

## 2014-08-11 MED ORDER — MECLIZINE HCL 50 MG PO TABS
25.0000 mg | ORAL_TABLET | Freq: Three times a day (TID) | ORAL | Status: DC | PRN
Start: 2014-08-11 — End: 2014-10-02

## 2014-08-11 NOTE — Progress Notes (Signed)
Subjective:    Patient ID: Molly Chandler, female    DOB: 02/08/1935, 79 y.o.   MRN: [id number]  HPI  Pt presents to the clinic today to follow up ER visit. She went to [facility]  08/05/14 with an episode of severe dizziness that occurred the night before while laying it bed. It seemed to occur with position changes. It had gotten better by the time she got to the ER. ECG and labs were all normal. There was no indication that she was having a CVA. She was discharged home without medication. She reports that she is still dizzy at times when she walks.She denies chest pain or shortness of breath. It seems to be better when she is sitting down. She has also noticed a burning sensation in her vaginal area. She denies urinary symptoms. She has tried Monistat with good relief.   Review of Systems      Past Medical History  Diagnosis Date  . Cataract   . Osteoporosis     osteopenia  . Lymphoma     rectal cancer    Current Outpatient Prescriptions  Medication Sig Dispense Refill  . Aspirin-Salicylamide-Caffeine (BC HEADACHE POWDER PO) Take by mouth.    . Benfotiamine 150 MG CAPS Take 1 capsule by mouth daily.    . Cholecalciferol (VITAMIN D3) 2000 UNITS capsule Take 2,000 Units by mouth daily.      . clindamycin (CLEOCIN) 300 MG capsule Take 1 capsule (300 mg total) by mouth 3 (three) times daily. 30 capsule 0  . Multiple Vitamin (MULTIVITAMIN) tablet Take 1 tablet by mouth daily.    . naproxen (NAPROSYN) 500 MG tablet Take 500 mg by mouth at bedtime.    . Probiotic Product (PROBIOTIC DAILY PO) Take by mouth.    . Travoprost, BAK Free, (TRAVATAMN) 0.004 % SOLN ophthalmic solution Place 1 drop into both eyes at bedtime.       No current facility-administered medications for this visit.    Allergies  Allergen Reactions  . Amoxicillin     GI intolerance.   . Metoclopramide Hcl     No family history on file.  History   Social History  . Marital Status: Married    Spouse Name: N/A      Number of Children: 5  . Years of Education: N/A   Occupational History  . Retired    Social History Main Topics  . Smoking status: Former Games developer  . Smokeless tobacco: Not on file  . Alcohol Use: No  . Drug Use: Not on file  . Sexual Activity: Not on file   Other Topics Concern  . Not on file   Social History Narrative     Constitutional: Pt reports fatigue. Denies fever, malaise,  headache or abrupt weight changes.  HEENT: Denies eye pain, eye redness, ear pain, ringing in the ears, wax buildup, runny nose, nasal congestion, bloody nose, or sore throat. Respiratory: Denies difficulty breathing, shortness of breath, cough or sputum production.   Cardiovascular: Denies chest pain, chest tightness, palpitations or swelling in the hands or feet.  Neurological: Pt reports dizziness. Denies dizziness, difficulty with memory, difficulty with speech or problems with balance and coordination.   No other specific complaints in a complete review of systems (except as listed in HPI above).  Objective:   Physical Exam  BP 118/62 mmHg  Pulse 77  Temp(Src) 98 F (36.7 C) (Oral)  Wt 174 lb (78.926 kg)  SpO2 97% Wt Readings from Last 3 Encounters:  08/11/14 174 lb (78.926 kg)  05/22/14 179 lb (81.194 kg)  04/27/14 175 lb (79.379 kg)    General: Appears her stated age, well developed, well nourished in NAD. HEENT: Head: normal shape and size; Ears: Tm's gray and intact, normal light reflex; Nose: mucosa pink and moist, septum midline; Throat/Mouth: Teeth present, mucosa pink and moist, no exudate, lesions or ulcerations noted.  Cardiovascular: Normal rate and rhythm. S1,S2 noted.  No murmur, rubs or gallops noted.  Pulmonary/Chest: Normal effort and positive vesicular breath sounds. No respiratory distress. No wheezes, rales or ronchi noted.  Neurological: Alert and oriented. Negative Romberg. Coordination normal.   BMET    Component Value Date/Time   NA 140 04/27/2014 1549    K 4.5 04/27/2014 1549   CL 105 04/27/2014 1549   CO2 28 04/27/2014 1549   GLUCOSE 87 04/27/2014 1549   BUN 18 04/27/2014 1549   CREATININE 0.6 04/27/2014 1549   CREATININE 0.66 08/01/2011 1536   CALCIUM 9.5 04/27/2014 1549   GFRNONAA 108.21 05/30/2010 1501    Lipid Panel     Component Value Date/Time   CHOL 196 04/27/2014 1549   TRIG 50.0 04/27/2014 1549   HDL 45.60 04/27/2014 1549   CHOLHDL 4 04/27/2014 1549   VLDL 10.0 04/27/2014 1549   LDLCALC 140* 04/27/2014 1549    CBC    Component Value Date/Time   WBC 5.6 04/27/2014 1549   WBC 6.8 10/21/2010 0856   WBC 4.0 12/23/2007 0811   RBC 3.88 04/27/2014 1549   RBC 4.31 10/21/2010 0856   RBC 3.98 12/23/2007 0811   HGB 12.1 04/27/2014 1549   HGB 13.3 10/21/2010 0856   HGB 12.6 12/23/2007 0811   HCT 36.3 04/27/2014 1549   HCT 40.0 10/21/2010 0856   HCT 36.0 12/23/2007 0811   PLT 186.0 04/27/2014 1549   PLT 241 10/21/2010 0856   PLT 208 12/23/2007 0811   MCV 93.5 04/27/2014 1549   MCV 93 10/21/2010 0856   MCV 90.5 12/23/2007 0811   MCH 30.8 10/21/2010 0856   MCH 31.8 12/23/2007 0811   MCHC 33.4 04/27/2014 1549   MCHC 33.2 10/21/2010 0856   MCHC 35.2 12/23/2007 0811   RDW 13.4 04/27/2014 1549   RDW 11.9 10/21/2010 0856   RDW 13.4 12/23/2007 0811   LYMPHSABS 1.5 11/10/2012 1131   LYMPHSABS 1.1 10/21/2010 0856   LYMPHSABS 1.1 12/23/2007 0811   MONOABS 0.4 11/10/2012 1131   MONOABS 0.4 12/23/2007 0811   EOSABS 0.1 11/10/2012 1131   EOSABS 0.1 10/21/2010 0856   EOSABS 0.1 12/23/2007 0811   BASOSABS 0.0 11/10/2012 1131   BASOSABS 0.0 10/21/2010 0856   BASOSABS 0.0 12/23/2007 0811    Hgb A1C No results found for: HGBA1C       Assessment & Plan:   Dizziness:  Hospital notes, labs reviewed Will check orthostatics- no drop in BP Will treat with Meclizine She is requesting CT Scan- advised her to lets try Meclizine x 1 week if dizziness persist she can follow up with PCP to discuss CT scan  Vaginal  Irritation:  Resolved No further treatment needed  RTC as needed or if symptoms persist or worsen

## 2014-08-11 NOTE — Patient Instructions (Signed)

## 2014-09-15 ENCOUNTER — Emergency Department: Payer: Self-pay | Admitting: Emergency Medicine

## 2014-09-15 DIAGNOSIS — N390 Urinary tract infection, site not specified: Secondary | ICD-10-CM | POA: Diagnosis not present

## 2014-09-15 DIAGNOSIS — R300 Dysuria: Secondary | ICD-10-CM | POA: Diagnosis not present

## 2014-09-15 LAB — URINALYSIS, COMPLETE
Bilirubin,UR: NEGATIVE
Glucose,UR: NEGATIVE mg/dL (ref 0–75)
Ketone: NEGATIVE
Nitrite: POSITIVE
Ph: 6 (ref 4.5–8.0)
Protein: NEGATIVE
RBC,UR: 18 /HPF (ref 0–5)
Specific Gravity: 1.005 (ref 1.003–1.030)
Squamous Epithelial: 2
WBC UR: 3316 /HPF (ref 0–5)

## 2014-09-19 ENCOUNTER — Other Ambulatory Visit: Payer: Self-pay | Admitting: Physical Medicine and Rehabilitation

## 2014-09-19 DIAGNOSIS — M48061 Spinal stenosis, lumbar region without neurogenic claudication: Secondary | ICD-10-CM

## 2014-09-20 DIAGNOSIS — N3020 Other chronic cystitis without hematuria: Secondary | ICD-10-CM | POA: Diagnosis not present

## 2014-09-20 DIAGNOSIS — R350 Frequency of micturition: Secondary | ICD-10-CM | POA: Diagnosis not present

## 2014-09-25 ENCOUNTER — Other Ambulatory Visit: Payer: BC Managed Care – PPO

## 2014-09-29 ENCOUNTER — Other Ambulatory Visit: Payer: BC Managed Care – PPO

## 2014-10-02 ENCOUNTER — Encounter: Payer: Self-pay | Admitting: Internal Medicine

## 2014-10-02 ENCOUNTER — Ambulatory Visit (INDEPENDENT_AMBULATORY_CARE_PROVIDER_SITE_OTHER): Payer: Medicare Other | Admitting: Internal Medicine

## 2014-10-02 ENCOUNTER — Ambulatory Visit
Admission: RE | Admit: 2014-10-02 | Discharge: 2014-10-02 | Disposition: A | Discharge status: Home / Self Care | Payer: Medicare Other | Source: Ambulatory Visit | Attending: Physical Medicine and Rehabilitation | Admitting: Physical Medicine and Rehabilitation

## 2014-10-02 VITALS — BP 116/78 | HR 95 | Temp 97.6°F | Wt 174.0 lb

## 2014-10-02 DIAGNOSIS — N390 Urinary tract infection, site not specified: Secondary | ICD-10-CM

## 2014-10-02 DIAGNOSIS — R300 Dysuria: Secondary | ICD-10-CM

## 2014-10-02 DIAGNOSIS — M48061 Spinal stenosis, lumbar region without neurogenic claudication: Secondary | ICD-10-CM

## 2014-10-02 LAB — POCT URINALYSIS DIPSTICK
Blood, UA: NEGATIVE
Nitrite, UA: POSITIVE
Spec Grav, UA: 1.025
Urobilinogen, UA: 2
pH, UA: 5

## 2014-10-02 MED ORDER — CIPROFLOXACIN HCL 500 MG PO TABS: 500.0000 mg | ORAL_TABLET | Freq: Two times a day (BID) | ORAL | Status: DC

## 2014-10-02 MED ORDER — IOHEXOL 180 MG/ML  SOLN: 1.0000 mL | Freq: Once | INTRAMUSCULAR | Status: AC | PRN

## 2014-10-02 MED ORDER — METHYLPREDNISOLONE ACETATE 40 MG/ML INJ SUSP (RADIOLOG: 120.0000 mg | Freq: Once | INTRAMUSCULAR | Status: DC

## 2014-10-02 NOTE — Addendum Note (Signed)
Addended by: Jearld Fenton on: 10/02/2014 04:39 PM   Modules accepted: Miquel Dunn

## 2014-10-02 NOTE — Addendum Note (Signed)
Addended by: Lurlean Nanny on: 10/02/2014 05:03 PM   Modules accepted: Orders

## 2014-10-02 NOTE — Progress Notes (Signed)
Pre visit review using our clinic review tool, if applicable. No additional management support is needed unless otherwise documented below in the visit note. 

## 2014-10-02 NOTE — Progress Notes (Addendum)
HPI  Pt presents to the clinic today with c/o frequency, urgency and dysuria. This started 3 weeks ago.  She denies fever, chills or body aches. She was just treated with Macrobid 09/15/14. She reports that she is having recurrent infections. Her symptoms will go away while on antibiotics but return shortly after she stops them. She is being followed by urology.   Review of Systems  Past Medical History  Diagnosis Date  . Cataract   . Osteoporosis     osteopenia  . Lymphoma     rectal cancer    No family history on file.  History   Social History  . Marital Status: Married    Spouse Name: N/A    Number of Children: 68  . Years of Education: N/A   Occupational History  . Retired    Social History Main Topics  . Smoking status: Former Research scientist (life sciences)  . Smokeless tobacco: Not on file  . Alcohol Use: No  . Drug Use: Not on file  . Sexual Activity: Not on file   Other Topics Concern  . Not on file   Social History Narrative    Allergies  Allergen Reactions  . Amoxicillin     GI intolerance.   . Metoclopramide Hcl     Constitutional: Denies fever, malaise, fatigue, headache or abrupt weight changes.   GU: Pt reports urgency, frequency and pain with urination. Denies burning sensation, blood in urine, odor or discharge. Skin: Denies redness, rashes, lesions or ulcercations.   No other specific complaints in a complete review of systems (except as listed in HPI above).    Objective:   Physical Exam  BP 116/78 mmHg  Pulse 95  Temp(Src) 97.6 F (36.4 C) (Oral)  Wt 174 lb (78.926 kg)  SpO2 96%    Wt Readings from Last 3 Encounters:  10/02/14 174 lb (78.926 kg)  08/11/14 174 lb (78.926 kg)  05/22/14 179 lb (81.194 kg)    General: Appears her stated age,  in NAD. Cardiovascular: Normal rate and rhythm. S1,S2 noted.  No murmur, rubs or gallops noted.  Pulmonary/Chest: Normal effort and positive vesicular breath sounds. No respiratory distress. No wheezes, rales or  ronchi noted.  Abdomen: Soft. Normal bowel sounds, no bruits noted. No distention or masses noted. Liver, spleen and kidneys non palpable. Tender to palpation over the bladder area. No CVA tenderness.      Assessment & Plan:   Urgency, Frequency, Dysuria secondary to UTI  Urinalysis: 3+ leuks, Pos nitrites Will send for urine culture eRx sent if for Cipro BID x 7 days OK to take AZO OTC Drink plenty of fluids  RTC as needed or if symptoms persist.

## 2014-10-02 NOTE — Discharge Instructions (Signed)

## 2014-10-02 NOTE — Patient Instructions (Signed)

## 2014-10-05 LAB — URINE CULTURE: Colony Count: 40000

## 2014-10-06 ENCOUNTER — Other Ambulatory Visit: Payer: Medicare Other

## 2014-10-12 DIAGNOSIS — N3020 Other chronic cystitis without hematuria: Secondary | ICD-10-CM | POA: Diagnosis not present

## 2014-10-12 DIAGNOSIS — N3946 Mixed incontinence: Secondary | ICD-10-CM | POA: Diagnosis not present

## 2014-10-20 ENCOUNTER — Ambulatory Visit
Admission: RE | Admit: 2014-10-20 | Discharge: 2014-10-20 | Disposition: A | Discharge status: Home / Self Care | Payer: Medicare Other | Source: Ambulatory Visit | Attending: Physical Medicine and Rehabilitation | Admitting: Physical Medicine and Rehabilitation

## 2014-10-20 DIAGNOSIS — M545 Low back pain: Secondary | ICD-10-CM | POA: Diagnosis not present

## 2014-10-20 DIAGNOSIS — M5431 Sciatica, right side: Secondary | ICD-10-CM

## 2014-10-20 MED ORDER — METHYLPREDNISOLONE ACETATE 40 MG/ML INJ SUSP (RADIOLOG
120.0000 mg | Freq: Once | INTRAMUSCULAR | Status: AC
  Administered 2014-10-20: 120 mg via EPIDURAL

## 2014-10-20 MED ORDER — IOHEXOL 180 MG/ML  SOLN
1.0000 mL | Freq: Once | INTRAMUSCULAR | Status: AC | PRN
  Administered 2014-10-20: 1 mL via EPIDURAL

## 2014-11-08 ENCOUNTER — Other Ambulatory Visit: Payer: Self-pay | Admitting: Internal Medicine

## 2014-11-18 DIAGNOSIS — M4807 Spinal stenosis, lumbosacral region: Secondary | ICD-10-CM | POA: Diagnosis not present

## 2014-11-18 DIAGNOSIS — M5136 Other intervertebral disc degeneration, lumbar region: Secondary | ICD-10-CM | POA: Diagnosis not present

## 2014-11-27 ENCOUNTER — Ambulatory Visit (INDEPENDENT_AMBULATORY_CARE_PROVIDER_SITE_OTHER): Payer: Medicare Other | Admitting: Internal Medicine

## 2014-11-27 ENCOUNTER — Encounter: Payer: Self-pay | Admitting: Internal Medicine

## 2014-11-27 VITALS — BP 118/82 | HR 69 | Temp 97.5°F | Wt 174.0 lb

## 2014-11-27 DIAGNOSIS — N390 Urinary tract infection, site not specified: Secondary | ICD-10-CM | POA: Diagnosis not present

## 2014-11-27 DIAGNOSIS — R300 Dysuria: Secondary | ICD-10-CM | POA: Diagnosis not present

## 2014-11-27 MED ORDER — SULFAMETHOXAZOLE-TRIMETHOPRIM 800-160 MG PO TABS: 1.0000 | ORAL_TABLET | Freq: Two times a day (BID) | ORAL | Status: DC

## 2014-11-27 NOTE — Patient Instructions (Signed)

## 2014-11-27 NOTE — Progress Notes (Signed)
HPI  Pt presents to the clinic today with c/o urgency, frequency and dysuria. She reports this started yesterday. She denies fever, chills, nausea or low back pain. She has tried AZO without relief. She does get UTI's frequently and reports this feels the same. Her last UTI was 09/2014 treat with Cipro x 7 days. She has a follow up appt with urology 12/11/14.   Review of Systems  Past Medical History  Diagnosis Date  . Cataract   . Osteoporosis     osteopenia  . Lymphoma     rectal cancer    No family history on file.  History   Social History  . Marital Status: Married    Spouse Name: N/A  . Number of Children: 5  . Years of Education: N/A   Occupational History  . Retired    Social History Main Topics  . Smoking status: Former Research scientist (life sciences)  . Smokeless tobacco: Not on file  . Alcohol Use: No  . Drug Use: Not on file  . Sexual Activity: Not on file   Other Topics Concern  . Not on file   Social History Narrative    Allergies  Allergen Reactions  . Amoxicillin     GI intolerance.   . Metoclopramide Hcl     Constitutional: Denies fever, malaise, fatigue, headache or abrupt weight changes.   GU: Pt reports urgency, frequency and pain with urination. Denies burning sensation, blood in urine, odor or discharge. Skin: Denies redness, rashes, lesions or ulcercations.   No other specific complaints in a complete review of systems (except as listed in HPI above).    Objective:   Physical Exam  BP 118/82 mmHg  Pulse 69  Temp(Src) 97.5 F (36.4 C) (Oral)  Wt 174 lb (78.926 kg)  SpO2 97% Wt Readings from Last 3 Encounters:  11/27/14 174 lb (78.926 kg)  10/02/14 174 lb (78.926 kg)  08/11/14 174 lb (78.926 kg)    General: Appears her stated age, well developed, well nourished in NAD. Cardiovascular: Normal rate and rhythm. S1,S2 noted.  No murmur, rubs or gallops noted.  Pulmonary/Chest: Normal effort and positive vesicular breath sounds. No respiratory distress. No  wheezes, rales or ronchi noted.  Abdomen: Soft. Normal bowel sounds, no bruits noted. No distention or masses noted. Liver, spleen and kidneys non palpable. Tender to palpation over the bladder area. No CVA tenderness.      Assessment & Plan:   Urgency, Frequency, Dysuria secondary to UTI  Urinalysis: AZO interfered with urinalysis Will send urine culture eRx sent if for Septra BID x 5 days OK to take AZO OTC Drink plenty of fluids  RTC as needed or if symptoms persist.

## 2014-11-27 NOTE — Addendum Note (Signed)
Addended by: Lurlean Nanny on: 11/27/2014 04:55 PM   Modules accepted: Orders

## 2014-11-27 NOTE — Progress Notes (Signed)
Pre visit review using our clinic review tool, if applicable. No additional management support is needed unless otherwise documented below in the visit note. 

## 2014-11-29 DIAGNOSIS — M4807 Spinal stenosis, lumbosacral region: Secondary | ICD-10-CM | POA: Diagnosis not present

## 2014-11-30 LAB — URINE CULTURE: Colony Count: >=100000

## 2014-12-11 DIAGNOSIS — M5136 Other intervertebral disc degeneration, lumbar region: Secondary | ICD-10-CM | POA: Diagnosis not present

## 2014-12-11 DIAGNOSIS — M47816 Spondylosis without myelopathy or radiculopathy, lumbar region: Secondary | ICD-10-CM | POA: Diagnosis not present

## 2014-12-13 ENCOUNTER — Other Ambulatory Visit: Payer: Self-pay | Admitting: Physical Medicine and Rehabilitation

## 2014-12-13 DIAGNOSIS — M47816 Spondylosis without myelopathy or radiculopathy, lumbar region: Secondary | ICD-10-CM

## 2014-12-15 ENCOUNTER — Other Ambulatory Visit: Payer: Self-pay | Admitting: Physical Medicine and Rehabilitation

## 2014-12-15 ENCOUNTER — Ambulatory Visit
Admission: RE | Admit: 2014-12-15 | Discharge: 2014-12-15 | Disposition: A | Discharge status: Home / Self Care | Payer: Medicare Other | Source: Ambulatory Visit | Attending: Physical Medicine and Rehabilitation | Admitting: Physical Medicine and Rehabilitation

## 2014-12-15 DIAGNOSIS — M47816 Spondylosis without myelopathy or radiculopathy, lumbar region: Secondary | ICD-10-CM | POA: Diagnosis not present

## 2014-12-15 DIAGNOSIS — M545 Low back pain: Secondary | ICD-10-CM | POA: Diagnosis not present

## 2014-12-15 MED ORDER — METHYLPREDNISOLONE ACETATE 40 MG/ML INJ SUSP (RADIOLOG
120.0000 mg | Freq: Once | INTRAMUSCULAR | Status: AC
  Administered 2014-12-15: 120 mg via EPIDURAL

## 2014-12-15 MED ORDER — IOHEXOL 180 MG/ML  SOLN
1.0000 mL | Freq: Once | INTRAMUSCULAR | Status: AC | PRN
Start: 2014-12-15 — End: 2014-12-15

## 2014-12-15 NOTE — Discharge Instructions (Signed)

## 2014-12-25 DIAGNOSIS — N3946 Mixed incontinence: Secondary | ICD-10-CM | POA: Diagnosis not present

## 2014-12-25 DIAGNOSIS — N3020 Other chronic cystitis without hematuria: Secondary | ICD-10-CM | POA: Diagnosis not present

## 2015-01-17 ENCOUNTER — Ambulatory Visit (INDEPENDENT_AMBULATORY_CARE_PROVIDER_SITE_OTHER): Payer: Medicare Other | Admitting: Internal Medicine

## 2015-01-17 ENCOUNTER — Encounter: Payer: Self-pay | Admitting: Internal Medicine

## 2015-01-17 VITALS — BP 120/76 | HR 74 | Temp 97.6°F | Wt 173.0 lb

## 2015-01-17 DIAGNOSIS — G44209 Tension-type headache, unspecified, not intractable: Secondary | ICD-10-CM

## 2015-01-17 DIAGNOSIS — R252 Cramp and spasm: Secondary | ICD-10-CM

## 2015-01-17 NOTE — Progress Notes (Signed)
Pre visit review using our clinic review tool, if applicable. No additional management support is needed unless otherwise documented below in the visit note. 

## 2015-01-17 NOTE — Patient Instructions (Signed)

## 2015-01-17 NOTE — Progress Notes (Signed)
Subjective:    Patient ID: Molly Chandler, female    DOB: Apr 14, 1935, 80 y.o.   MRN: [id number]  HPI  Pt presents to the clinic today with a few complaints.  1- She has been having muscle cramps. It is occuring in her legs and feet. She reports this started over the last 6 months. She also feels like she has cramping in her hands and fingers. She reports it hurts all day and all night long. Walking around seems to make it better. Nothing seems to make it worse. She has been seen at the ER for the same on 2 different occassions. She also saw Dr. Dayton Martes 12/2013 for the same, note reviewed. She reports the Gabapentin that she was given did not help at all. She has tried eating pickles, vinegar and mustard without any relief.  2- She also reports headache. This has been going on for the last 6 months as well. It starts usually in the back of her head and radiates into her temples. She reports it feels like pressure and like "sandpaper rubbing together". She has had some associated dizziness with it. She denies blurred vision, sensitivity to light or sound, nausea or vomiting. She reports she has also been seen in the ER for this same issue as well. She has tried [facility] powder or Ibuprofen without much relief. She denies any recent falls. She reports she has been under some stress lately and she never sleeps well.  Review of Systems      Past Medical History  Diagnosis Date  . Cataract   . Osteoporosis     osteopenia  . Lymphoma     rectal cancer    Current Outpatient Prescriptions  Medication Sig Dispense Refill  . Multiple Minerals (CALCIUM-MAGNESIUM-ZINC) TABS Take 1 tablet by mouth at bedtime.    . Multiple Vitamin (MULTIVITAMIN) tablet Take 1 tablet by mouth daily.    . naproxen (NAPROSYN) 500 MG tablet Take 500 mg by mouth at bedtime.    . phenazopyridine (PYRIDIUM) 200 MG tablet   0  . Probiotic Product (PROBIOTIC DAILY PO) Take by mouth.    . sulfamethoxazole-trimethoprim (BACTRIM  DS,SEPTRA DS) 800-160 MG per tablet Take 1 tablet by mouth 2 (two) times daily. 14 tablet 0  . Travoprost, BAK Free, (TRAVATAMN) 0.004 % SOLN ophthalmic solution Place 1 drop into both eyes at bedtime.       No current facility-administered medications for this visit.    Allergies  Allergen Reactions  . Amoxicillin Diarrhea and Nausea And Vomiting    GI intolerance.   . Metoclopramide Hcl Other (See Comments)    Stroke-like symptoms (dose too high?)    History reviewed. No pertinent family history.  History   Social History  . Marital Status: Married    Spouse Name: N/A  . Number of Children: 5  . Years of Education: N/A   Occupational History  . Retired    Social History Main Topics  . Smoking status: Former Games developer  . Smokeless tobacco: Not on file  . Alcohol Use: No  . Drug Use: Not on file  . Sexual Activity: Not on file   Other Topics Concern  . Not on file   Social History Narrative     Constitutional: Pt reports headache. Denies fever, malaise, fatigue, or abrupt weight changes.  HEENT: Denies eye pain, eye redness, ear pain, ringing in the ears, wax buildup, runny nose, nasal congestion, bloody nose, or sore throat. Respiratory: Denies difficulty breathing,  shortness of breath, cough or sputum production.   Cardiovascular: Denies chest pain, chest tightness, palpitations or swelling in the hands or feet.  Musculoskeletal: Pt reports muscle cramping. Denies decrease in range of motion, difficulty with gait,  or joint pain and swelling.  Skin: Denies redness, rashes, lesions or ulcercations.  Neurological: Pt reports dizziness. Denies difficulty with memory, difficulty with speech or problems with balance and coordination.   No other specific complaints in a complete review of systems (except as listed in HPI above).  Objective:   Physical Exam   BP 120/76 mmHg  Pulse 74  Temp(Src) 97.6 F (36.4 C) (Oral)  Wt 173 lb (78.472 kg)  SpO2 97% Wt Readings  from Last 3 Encounters:  01/17/15 173 lb (78.472 kg)  11/27/14 174 lb (78.926 kg)  10/02/14 174 lb (78.926 kg)    General: Appears her  stated age, well developed, well nourished in NAD. Skin: Warm, dry and intact. No rashes, lesions or ulcerations noted. HEENT: Head: normal shape and size; Eyes: sclera white, no icterus, conjunctiva pink, PERRLA and EOMs intact;  Cardiovascular: Normal rate and rhythm. S1,S2 noted.  No murmur, rubs or gallops noted. No BLE edema. No carotid bruits noted. Pulmonary/Chest: Normal effort and positive vesicular breath sounds. No respiratory distress. No wheezes, rales or ronchi noted.  Musculoskeletal: Normal range of motion. No signs of swelling. Strength 5/5 BLE. No difficulty with gait.  Neurological: Alert and oriented. Coordination normal. .   BMET    Component Value Date/Time   NA 138 08/05/2014 1652   NA 140 04/27/2014 1549   K 3.7 08/05/2014 1652   K 4.5 04/27/2014 1549   CL 104 08/05/2014 1652   CL 105 04/27/2014 1549   CO2 29 08/05/2014 1652   CO2 28 04/27/2014 1549   GLUCOSE 89 08/05/2014 1652   GLUCOSE 87 04/27/2014 1549   BUN 19* 08/05/2014 1652   BUN 18 04/27/2014 1549   CREATININE 0.72 08/05/2014 1652   CREATININE 0.6 04/27/2014 1549   CREATININE 0.66 08/01/2011 1536   CALCIUM 9.1 08/05/2014 1652   CALCIUM 9.5 04/27/2014 1549   GFRNONAA 108.21 05/30/2010 1501    Lipid Panel     Component Value Date/Time   CHOL 196 04/27/2014 1549   TRIG 50.0 04/27/2014 1549   HDL 45.60 04/27/2014 1549   CHOLHDL 4 04/27/2014 1549   VLDL 10.0 04/27/2014 1549   LDLCALC 140* 04/27/2014 1549    CBC    Component Value Date/Time   WBC 7.5 08/05/2014 1652   WBC 5.6 04/27/2014 1549   WBC 6.8 10/21/2010 0856   WBC 4.0 12/23/2007 0811   RBC 4.03 08/05/2014 1652   RBC 3.88 04/27/2014 1549   RBC 4.31 10/21/2010 0856   RBC 3.98 12/23/2007 0811   HGB 12.3 08/05/2014 1652   HGB 12.1 04/27/2014 1549   HGB 13.3 10/21/2010 0856   HGB 12.6  12/23/2007 0811   HCT 38.1 08/05/2014 1652   HCT 36.3 04/27/2014 1549   HCT 40.0 10/21/2010 0856   HCT 36.0 12/23/2007 0811   PLT 212 08/05/2014 1652   PLT 186.0 04/27/2014 1549   PLT 241 10/21/2010 0856   PLT 208 12/23/2007 0811   MCV 95 08/05/2014 1652   MCV 93.5 04/27/2014 1549   MCV 93 10/21/2010 0856   MCV 90.5 12/23/2007 0811   MCH 30.5 08/05/2014 1652   MCH 30.8 10/21/2010 0856   MCH 31.8 12/23/2007 0811   MCHC 32.2 08/05/2014 1652   MCHC 33.4 04/27/2014 1549  MCHC 33.2 10/21/2010 0856   MCHC 35.2 12/23/2007 0811   RDW 13.7 08/05/2014 1652   RDW 13.4 04/27/2014 1549   RDW 11.9 10/21/2010 0856   RDW 13.4 12/23/2007 0811   LYMPHSABS 2.1 08/05/2014 1652   LYMPHSABS 1.5 11/10/2012 1131   LYMPHSABS 1.1 10/21/2010 0856   LYMPHSABS 1.1 12/23/2007 0811   MONOABS 0.5 08/05/2014 1652   MONOABS 0.4 11/10/2012 1131   MONOABS 0.4 12/23/2007 0811   EOSABS 0.1 08/05/2014 1652   EOSABS 0.1 11/10/2012 1131   EOSABS 0.1 10/21/2010 0856   EOSABS 0.1 12/23/2007 0811   BASOSABS 0.1 08/05/2014 1652   BASOSABS 0.0 11/10/2012 1131   BASOSABS 0.0 10/21/2010 0856   BASOSABS 0.0 12/23/2007 0811    Hgb A1C No results found for: HGBA1C      Assessment & Plan:   Muscle Cramps:  This is ongoing Will will check her electrolytes again although these have been normal in the past She has seen podiatry, they have not been able to help She has seen PCP, but reports Gabapentin did not help Advised her she may need to follow back up with her PCP   Headaches:  They sound tension related She is only taking 200 mg Ibuprofen Advised her to try 400 mg Ibuprofen to see if that makes a difference She will follow up with PCP if symptoms persist  RTC as needed or if symptoms persist or worsen

## 2015-01-18 ENCOUNTER — Telehealth: Payer: Self-pay | Admitting: Family Medicine

## 2015-01-18 ENCOUNTER — Other Ambulatory Visit: Payer: Self-pay | Admitting: Internal Medicine

## 2015-01-18 LAB — BASIC METABOLIC PANEL
BUN: 20 mg/dL (ref 6–23)
CO2: 31 mEq/L (ref 19–32)
Calcium: 9.5 mg/dL (ref 8.4–10.5)
Chloride: 104 mEq/L (ref 96–112)
Creatinine, Ser: 0.87 mg/dL (ref 0.40–1.20)
GFR: 66.93 mL/min (ref >60.00)
Glucose, Bld: 90 mg/dL (ref 70–99)
Potassium: 4.7 mEq/L (ref 3.5–5.1)
Sodium: 139 mEq/L (ref 135–145)

## 2015-01-18 LAB — MAGNESIUM: Magnesium: 2.3 mg/dL (ref 1.5–2.5)

## 2015-01-18 MED ORDER — GABAPENTIN 100 MG PO CAPS: 100.0000 mg | ORAL_CAPSULE | Freq: Every day | ORAL | Status: DC

## 2015-01-18 NOTE — Telephone Encounter (Signed)
Pt notified Rx sent to pharmacy

## 2015-01-18 NOTE — Telephone Encounter (Signed)
Patient was seen yesterday by Rollene Fare.  Patient said she would like to try Gabapentin. Patient uses Chief of Staff.

## 2015-01-18 NOTE — Telephone Encounter (Signed)
Gabapentin sent to pharmacy

## 2015-01-23 DIAGNOSIS — M47816 Spondylosis without myelopathy or radiculopathy, lumbar region: Secondary | ICD-10-CM | POA: Diagnosis not present

## 2015-01-30 DIAGNOSIS — M5136 Other intervertebral disc degeneration, lumbar region: Secondary | ICD-10-CM | POA: Diagnosis not present

## 2015-01-30 DIAGNOSIS — M4807 Spinal stenosis, lumbosacral region: Secondary | ICD-10-CM | POA: Diagnosis not present

## 2015-02-12 DIAGNOSIS — M4807 Spinal stenosis, lumbosacral region: Secondary | ICD-10-CM | POA: Diagnosis not present

## 2015-02-12 DIAGNOSIS — M5136 Other intervertebral disc degeneration, lumbar region: Secondary | ICD-10-CM | POA: Diagnosis not present

## 2015-02-12 DIAGNOSIS — M5441 Lumbago with sciatica, right side: Secondary | ICD-10-CM | POA: Diagnosis not present

## 2015-02-13 ENCOUNTER — Ambulatory Visit: Payer: Medicare Other | Admitting: Family Medicine

## 2015-02-13 ENCOUNTER — Encounter: Payer: Self-pay | Admitting: Family Medicine

## 2015-02-13 VITALS — BP 116/70 | HR 76 | Temp 97.5°F | Ht 59.0 in | Wt 173.0 lb

## 2015-02-13 DIAGNOSIS — Z0000 Encounter for general adult medical examination without abnormal findings: Secondary | ICD-10-CM | POA: Insufficient documentation

## 2015-02-20 DIAGNOSIS — H4011X2 Primary open-angle glaucoma, moderate stage: Secondary | ICD-10-CM | POA: Diagnosis not present

## 2015-02-26 ENCOUNTER — Encounter: Payer: Self-pay | Admitting: Internal Medicine

## 2015-02-26 ENCOUNTER — Ambulatory Visit (INDEPENDENT_AMBULATORY_CARE_PROVIDER_SITE_OTHER): Payer: Medicare Other | Admitting: Internal Medicine

## 2015-02-26 VITALS — BP 120/80 | HR 82 | Temp 97.3°F | Wt 170.0 lb

## 2015-02-26 DIAGNOSIS — M7542 Impingement syndrome of left shoulder: Secondary | ICD-10-CM | POA: Diagnosis not present

## 2015-02-26 MED ORDER — HYDROCODONE-ACETAMINOPHEN 5-325 MG PO TABS: 1.0000 | ORAL_TABLET | Freq: Every evening | ORAL | Status: DC | PRN

## 2015-02-26 NOTE — Patient Instructions (Signed)
Please call for an appointment if you want to try a cortisone shot for your shoulder in 2 weeks or so (Dr Silvio Pate or [place]). Let me know if you change your mind and want a referral for physical therapy.

## 2015-02-26 NOTE — Assessment & Plan Note (Signed)
No rotator cuff tear but probably muscle strain with swelling Will use heat and continue the naproxen Offered PT--- she wants to hold off due to expense Consider cortisone shot if not improving norco for bedtime

## 2015-02-26 NOTE — Progress Notes (Signed)
Pre visit review using our clinic review tool, if applicable. No additional management support is needed unless otherwise documented below in the visit note. 

## 2015-02-26 NOTE — Progress Notes (Signed)
Subjective:    Patient ID: Molly Chandler, female    DOB: 22-Nov-1934, 80 y.o.   MRN: [id number]  HPI Here due to shoulder pain  Left shoulder has hurt for awhile but now much worse Can't even reach over to get seat belt and it is affecting her sleep No injury but pain started last week No lifting due to past injury in right shoulder Limited abduction  No apparent swelling Has tried heat rub--but hard to reach it. No clear help Already on naproxen  Current Outpatient Prescriptions on File Prior to Visit  Medication Sig Dispense Refill  . gabapentin (NEURONTIN) 100 MG capsule Take 1 capsule (100 mg total) by mouth at bedtime. 30 capsule 1  . Multiple Minerals (CALCIUM-MAGNESIUM-ZINC) TABS Take 1 tablet by mouth at bedtime.    . Multiple Vitamin (MULTIVITAMIN) tablet Take 1 tablet by mouth daily.    . naproxen (NAPROSYN) 500 MG tablet Take 500 mg by mouth at bedtime.    . Probiotic Product (PROBIOTIC DAILY PO) Take by mouth.    . Travoprost, BAK Free, (TRAVATAMN) 0.004 % SOLN ophthalmic solution Place 1 drop into both eyes at bedtime.       No current facility-administered medications on file prior to visit.    Allergies  Allergen Reactions  . Amoxicillin Diarrhea and Nausea And Vomiting    GI intolerance.   . Metoclopramide Hcl Other (See Comments)    Stroke-like symptoms (dose too high?)    Past Medical History  Diagnosis Date  . Cataract   . Osteoporosis     osteopenia  . Lymphoma     rectal cancer    No past surgical history on file.  No family history on file.  History   Social History  . Marital Status: Married    Spouse Name: N/A  . Number of Children: 5  . Years of Education: N/A   Occupational History  . Retired    Social History Main Topics  . Smoking status: Former Games developer  . Smokeless tobacco: Not on file  . Alcohol Use: No  . Drug Use: Not on file  . Sexual Activity: Not on file   Other Topics Concern  . Not on file   Social History  Narrative   Desires CPR   Would not want life support prolonged if futile   Review of Systems  No recent illness No fever Trouble with back and legs--- spinal stenosis and disc degeneration (Dr Ethelene Hal has done injections)     Objective:   Physical Exam  Constitutional: She appears well-developed and well-nourished. No distress.  Musculoskeletal:  No swelling in left shoulder Very slight anterior and posterior tenderness--nothing to suggest bursitis Active abduction to 90 degrees Limited external rotation > internal rotation          Assessment & Plan:

## 2015-03-20 ENCOUNTER — Other Ambulatory Visit: Payer: Self-pay | Admitting: Family Medicine

## 2015-03-20 DIAGNOSIS — Z1231 Encounter for screening mammogram for malignant neoplasm of breast: Secondary | ICD-10-CM

## 2015-04-03 ENCOUNTER — Ambulatory Visit (HOSPITAL_COMMUNITY)
Admission: RE | Admit: 2015-04-03 | Discharge: 2015-04-03 | Disposition: A | Discharge status: Home / Self Care | Payer: Medicare Other | Source: Ambulatory Visit | Attending: Family Medicine | Admitting: Family Medicine

## 2015-04-03 DIAGNOSIS — Z1231 Encounter for screening mammogram for malignant neoplasm of breast: Secondary | ICD-10-CM

## 2015-05-07 ENCOUNTER — Encounter: Payer: Self-pay | Admitting: Family Medicine

## 2015-05-07 ENCOUNTER — Ambulatory Visit (INDEPENDENT_AMBULATORY_CARE_PROVIDER_SITE_OTHER): Payer: Medicare Other | Admitting: Family Medicine

## 2015-05-07 VITALS — BP 128/68 | HR 61 | Temp 97.8°F | Ht 59.0 in | Wt 172.0 lb

## 2015-05-07 DIAGNOSIS — Z0000 Encounter for general adult medical examination without abnormal findings: Secondary | ICD-10-CM

## 2015-05-07 DIAGNOSIS — Z79899 Other long term (current) drug therapy: Secondary | ICD-10-CM | POA: Diagnosis not present

## 2015-05-07 DIAGNOSIS — Z1211 Encounter for screening for malignant neoplasm of colon: Secondary | ICD-10-CM

## 2015-05-07 DIAGNOSIS — M545 Low back pain, unspecified: Secondary | ICD-10-CM

## 2015-05-07 DIAGNOSIS — M4800 Spinal stenosis, site unspecified: Secondary | ICD-10-CM

## 2015-05-07 DIAGNOSIS — Z23 Encounter for immunization: Secondary | ICD-10-CM

## 2015-05-07 DIAGNOSIS — G8929 Other chronic pain: Secondary | ICD-10-CM

## 2015-05-07 LAB — CBC WITH DIFFERENTIAL/PLATELET
Basophils Absolute: 0 10*3/uL (ref 0.0–0.1)
Basophils Relative: 0.4 % (ref 0.0–3.0)
Eosinophils Absolute: 0.1 10*3/uL (ref 0.0–0.7)
Eosinophils Relative: 2.2 % (ref 0.0–5.0)
HCT: 38.4 % (ref 36.0–46.0)
Hemoglobin: 12.7 g/dL (ref 12.0–15.0)
Lymphocytes Relative: 30.8 % (ref 12.0–46.0)
Lymphs Abs: 1.7 10*3/uL (ref 0.7–4.0)
MCHC: 33 g/dL (ref 30.0–36.0)
MCV: 92 fl (ref 78.0–100.0)
Monocytes Absolute: 0.4 10*3/uL (ref 0.1–1.0)
Monocytes Relative: 6.8 % (ref 3.0–12.0)
Neutro Abs: 3.2 10*3/uL (ref 1.4–7.7)
Neutrophils Relative %: 59.8 % (ref 43.0–77.0)
Platelets: 205 10*3/uL (ref 150.0–400.0)
RBC: 4.17 Mil/uL (ref 3.87–5.11)
RDW: 13.8 % (ref 11.5–15.5)
WBC: 5.4 10*3/uL (ref 4.0–10.5)

## 2015-05-07 LAB — COMPREHENSIVE METABOLIC PANEL
ALT: 10 U/L (ref 0–35)
AST: 19 U/L (ref 0–37)
Albumin: 4.4 g/dL (ref 3.5–5.2)
Alkaline Phosphatase: 56 U/L (ref 39–117)
BUN: 21 mg/dL (ref 6–23)
CO2: 29 mEq/L (ref 19–32)
Calcium: 9.8 mg/dL (ref 8.4–10.5)
Chloride: 104 mEq/L (ref 96–112)
Creatinine, Ser: 0.7 mg/dL (ref 0.40–1.20)
GFR: 85.96 mL/min (ref >60.00)
Glucose, Bld: 82 mg/dL (ref 70–99)
Potassium: 3.9 mEq/L (ref 3.5–5.1)
Sodium: 140 mEq/L (ref 135–145)
Total Bilirubin: 0.5 mg/dL (ref 0.2–1.2)
Total Protein: 7.3 g/dL (ref 6.0–8.3)

## 2015-05-07 LAB — LIPID PANEL
Cholesterol: 228 mg/dL — ABNORMAL HIGH (ref 0–200)
HDL: 49.4 mg/dL (ref >39.00)
LDL Cholesterol: 165 mg/dL — ABNORMAL HIGH (ref 0–99)
NonHDL: 178.32
Total CHOL/HDL Ratio: 5
Triglycerides: 66 mg/dL (ref 0.0–149.0)
VLDL: 13.2 mg/dL (ref 0.0–40.0)

## 2015-05-07 LAB — TSH: TSH: 1.8 u[IU]/mL (ref 0.35–4.50)

## 2015-05-07 NOTE — Progress Notes (Signed)
Subjective:   Patient ID: Molly Chandler, female    DOB: 12-14-1934, 80 y.o.   MRN: [id number]  Molly Chandler is a pleasant 80 y.o. year old female who presents to clinic today with Annual Exam  on 05/07/2015  HPI:  I have personally reviewed the Medicare Annual Wellness questionnaire and have noted 1. The patient's medical and social history 2. Their use of alcohol, tobacco or illicit drugs 3. Their current medications and supplements 4. The patient's functional ability including ADL's, fall risks, home safety risks and hearing or visual             impairment. 5. Diet and physical activities 6. Evidence for depression or mood disorders  Zoster 04/27/14 Td 09/26/2011 Mammogram 04/03/15 Colonoscopy 12/19/10- Dr. Loreta Ave  End of life wishes discussed and updated in Social History.  The roster of all physicians providing medical care to patient - is listed in the [facility] section of the chart. Lab Results  Component Value Date   WBC 7.5 08/05/2014   HGB 12.3 08/05/2014   HCT 38.1 08/05/2014   MCV 95 08/05/2014   PLT 212 08/05/2014   Lab Results  Component Value Date   ALT 14 04/27/2014   AST 20 04/27/2014   ALKPHOS 52 04/27/2014   BILITOT 0.7 04/27/2014   Lab Results  Component Value Date   CHOL 196 04/27/2014   CHOL 217* 05/30/2010   Lab Results  Component Value Date   HDL 45.60 04/27/2014   HDL 43.80 05/30/2010   Lab Results  Component Value Date   LDLCALC 140* 04/27/2014   Lab Results  Component Value Date   TRIG 50.0 04/27/2014   TRIG 137.0 05/30/2010   Lab Results  Component Value Date   CHOLHDL 4 04/27/2014   CHOLHDL 5 05/30/2010    Lab Results  Component Value Date   ALT 14 04/27/2014   AST 20 04/27/2014   ALKPHOS 52 04/27/2014   BILITOT 0.7 04/27/2014    Lab Results  Component Value Date   CREATININE 0.87 01/17/2015   Lab Results  Component Value Date   TSH 1.59 11/10/2012     Current Outpatient Prescriptions on File Prior to  Visit  Medication Sig Dispense Refill  . gabapentin (NEURONTIN) 100 MG capsule Take 1 capsule (100 mg total) by mouth at bedtime. 30 capsule 1  . Multiple Minerals (CALCIUM-MAGNESIUM-ZINC) TABS Take 1 tablet by mouth at bedtime.    . Multiple Vitamin (MULTIVITAMIN) tablet Take 1 tablet by mouth daily.    . naproxen (NAPROSYN) 500 MG tablet Take 500 mg by mouth at bedtime.    . Probiotic Product (PROBIOTIC DAILY PO) Take by mouth.    . Travoprost, BAK Free, (TRAVATAMN) 0.004 % SOLN ophthalmic solution Place 1 drop into both eyes at bedtime.       No current facility-administered medications on file prior to visit.    Allergies  Allergen Reactions  . Amoxicillin Diarrhea and Nausea And Vomiting    GI intolerance.   . Metoclopramide Hcl Other (See Comments)    Stroke-like symptoms (dose too high?)    Past Medical History  Diagnosis Date  . Cataract   . Osteoporosis     osteopenia  . Lymphoma     rectal cancer    History reviewed. No pertinent past surgical history.  History reviewed. No pertinent family history.  Social History   Social History  . Marital Status: Married    Spouse Name: N/A  . Number of Children:  5  . Years of Education: N/A   Occupational History  . Retired    Social History Main Topics  . Smoking status: Former Games developer  . Smokeless tobacco: Not on file  . Alcohol Use: No  . Drug Use: Not on file  . Sexual Activity: Not on file   Other Topics Concern  . Not on file   Social History Narrative   Desires CPR   Would not want life support prolonged if futile   The PMH, PSH, Social History, Family History, Medications, and allergies have been reviewed in [facility], and have been updated if relevant.    Review of Systems  Constitutional: Negative.   HENT: Negative.   Eyes: Negative.   Respiratory: Negative.   Cardiovascular: Negative.   Gastrointestinal: Negative.   Endocrine: Negative.   Genitourinary: Negative.   Musculoskeletal: Negative.     Skin: Negative.   Allergic/Immunologic: Negative.   Neurological: Negative.   Hematological: Negative.   Psychiatric/Behavioral: Negative.   All other systems reviewed and are negative.      Objective:    BP 128/68 mmHg  Pulse 61  Temp(Src) 97.8 F (36.6 C) (Oral)  Ht 4\' 11"  (1.499 m)  Wt 172 lb (78.019 kg)  BMI 34.72 kg/m2  SpO2 95%  Wt Readings from Last 3 Encounters:  05/07/15 172 lb (78.019 kg)  02/26/15 170 lb (77.111 kg)  02/13/15 173 lb (78.472 kg)    Physical Exam  Constitutional: She is oriented to person, place, and time. She appears well-developed and well-nourished. No distress.  HENT:  Head: Normocephalic and atraumatic.  Eyes: Conjunctivae are normal.  Neck: Normal range of motion.  Cardiovascular: Normal rate, regular rhythm and normal heart sounds.   Pulmonary/Chest: Effort normal and breath sounds normal. No respiratory distress. She has no wheezes. She has no rales.  Abdominal: Soft.  Musculoskeletal: Normal range of motion. She exhibits no edema.  Neurological: She is alert and oriented to person, place, and time. No cranial nerve deficit.  Skin: Skin is warm and dry.  Psychiatric: She has a normal mood and affect. Her behavior is normal. Judgment and thought content normal.  Nursing note and vitals reviewed.         Assessment & Plan:   Medicare annual wellness visit, subsequent - Plan: CBC with Differential/Platelet, Comprehensive metabolic panel, Lipid panel, TSH  Chronic low back pain  Spinal stenosis, unspecified spinal region No Follow-up on file.

## 2015-05-07 NOTE — Assessment & Plan Note (Addendum)
The patients weight, height, BMI and visual acuity have been recorded in the chart.  Cognitive function assessed.   I have made referrals, counseling and provided education to the patient based review of the above and I have provided the pt with a written personalized care plan for preventive services.  Prevnar 13 and influenza vaccines given today.  

## 2015-05-07 NOTE — Patient Instructions (Signed)
Great to see you. We will call you with your lab results and you can view them online.  

## 2015-05-07 NOTE — Progress Notes (Signed)
Pre visit review using our clinic review tool, if applicable. No additional management support is needed unless otherwise documented below in the visit note. 

## 2015-05-25 ENCOUNTER — Other Ambulatory Visit (INDEPENDENT_AMBULATORY_CARE_PROVIDER_SITE_OTHER): Payer: Medicare Other

## 2015-05-25 DIAGNOSIS — Z1211 Encounter for screening for malignant neoplasm of colon: Secondary | ICD-10-CM

## 2015-05-25 LAB — FECAL OCCULT BLOOD, IMMUNOCHEMICAL: Fecal Occult Bld: POSITIVE — AB

## 2015-05-29 ENCOUNTER — Telehealth: Payer: Self-pay | Admitting: Family Medicine

## 2015-05-29 DIAGNOSIS — Z1211 Encounter for screening for malignant neoplasm of colon: Secondary | ICD-10-CM

## 2015-05-29 NOTE — Telephone Encounter (Signed)
Pt called wanting to know if you were going to mail stool card or does she need to come pick them up

## 2015-05-29 NOTE — Telephone Encounter (Signed)
Pt aware.

## 2015-05-29 NOTE — Telephone Encounter (Signed)
She has to pick them up from the lab

## 2015-06-04 ENCOUNTER — Encounter: Payer: Self-pay | Admitting: Radiology

## 2015-06-11 ENCOUNTER — Other Ambulatory Visit (INDEPENDENT_AMBULATORY_CARE_PROVIDER_SITE_OTHER): Payer: Medicare Other

## 2015-06-11 DIAGNOSIS — Z1211 Encounter for screening for malignant neoplasm of colon: Secondary | ICD-10-CM

## 2015-06-11 LAB — FECAL OCCULT BLOOD, IMMUNOCHEMICAL: Fecal Occult Bld: NEGATIVE

## 2015-06-11 NOTE — Addendum Note (Signed)
Addended by: Ellamae Sia on: 06/11/2015 05:07 PM   Modules accepted: Orders

## 2015-06-12 ENCOUNTER — Encounter: Payer: Self-pay | Admitting: *Deleted

## 2015-07-16 ENCOUNTER — Telehealth: Payer: Self-pay | Admitting: Family Medicine

## 2015-07-16 DIAGNOSIS — E785 Hyperlipidemia, unspecified: Secondary | ICD-10-CM

## 2015-07-16 NOTE — Telephone Encounter (Signed)
Yes please schedule lab visit and place lab orders.

## 2015-07-16 NOTE — Telephone Encounter (Signed)
Patient's cholesterol was high when it was checked.  Patient has been taking a cholesterol blocker for 2 months.  Patient wants to know if Dr.Aron wants to check her cholesterol.

## 2015-07-16 NOTE — Addendum Note (Signed)
Addended by: Lucille Passy on: 07/16/2015 10:19 AM   Modules accepted: Orders

## 2015-07-17 ENCOUNTER — Other Ambulatory Visit (INDEPENDENT_AMBULATORY_CARE_PROVIDER_SITE_OTHER): Payer: Medicare Other

## 2015-07-17 DIAGNOSIS — E785 Hyperlipidemia, unspecified: Secondary | ICD-10-CM | POA: Diagnosis not present

## 2015-07-17 LAB — LIPID PANEL
Cholesterol: 214 mg/dL — ABNORMAL HIGH (ref 0–200)
HDL: 42.4 mg/dL (ref >39.00)
LDL Cholesterol: 158 mg/dL — ABNORMAL HIGH (ref 0–99)
NonHDL: 171.95
Total CHOL/HDL Ratio: 5
Triglycerides: 71 mg/dL (ref 0.0–149.0)
VLDL: 14.2 mg/dL (ref 0.0–40.0)

## 2015-07-17 LAB — COMPREHENSIVE METABOLIC PANEL
ALT: 10 U/L (ref 0–35)
AST: 17 U/L (ref 0–37)
Albumin: 4.1 g/dL (ref 3.5–5.2)
Alkaline Phosphatase: 52 U/L (ref 39–117)
BUN: 15 mg/dL (ref 6–23)
CO2: 29 mEq/L (ref 19–32)
Calcium: 9.8 mg/dL (ref 8.4–10.5)
Chloride: 106 mEq/L (ref 96–112)
Creatinine, Ser: 0.74 mg/dL (ref 0.40–1.20)
GFR: 80.58 mL/min (ref >60.00)
Glucose, Bld: 91 mg/dL (ref 70–99)
Potassium: 4.3 mEq/L (ref 3.5–5.1)
Sodium: 142 mEq/L (ref 135–145)
Total Bilirubin: 0.5 mg/dL (ref 0.2–1.2)
Total Protein: 6.9 g/dL (ref 6.0–8.3)

## 2015-07-18 ENCOUNTER — Telehealth: Payer: Self-pay | Admitting: Family Medicine

## 2015-07-18 NOTE — Telephone Encounter (Signed)
If she does not want to start a statin, then I would suggest she take Red Yeast rice.  I cannot recommend the supplement [place]

## 2015-07-18 NOTE — Telephone Encounter (Signed)
Please call pt with her results

## 2015-07-18 NOTE — Telephone Encounter (Signed)
Pt called wanting to know what her chol was She is going red yeast rice fish oil and q10 Please advise

## 2015-07-18 NOTE — Telephone Encounter (Signed)
Yes if she is not willing to take a statin like lipitor.

## 2015-07-18 NOTE — Telephone Encounter (Signed)
Spoke to pt and advised per Dr Deborra Medina. Pt is not willing to start statin, but states she will continue red yeast rice.

## 2015-07-18 NOTE — Telephone Encounter (Signed)
According to Molly Chandler's note below, pt is already taking red yeast rice, and was doing so at the time of her labs. Should she just continue with it?

## 2015-07-18 NOTE — Telephone Encounter (Signed)
i had already spoken to her and advised her of results. She was wanting to know if she should continue the supplements below, as you asked her what she was taking for cholesterol

## 2015-08-06 DIAGNOSIS — G8929 Other chronic pain: Secondary | ICD-10-CM | POA: Diagnosis not present

## 2015-08-06 DIAGNOSIS — M5136 Other intervertebral disc degeneration, lumbar region: Secondary | ICD-10-CM

## 2015-08-06 DIAGNOSIS — M5442 Lumbago with sciatica, left side: Secondary | ICD-10-CM | POA: Diagnosis not present

## 2015-08-06 DIAGNOSIS — M51369 Other intervertebral disc degeneration, lumbar region without mention of lumbar back pain or lower extremity pain: Secondary | ICD-10-CM

## 2015-08-06 DIAGNOSIS — M545 Low back pain: Secondary | ICD-10-CM | POA: Diagnosis not present

## 2015-08-06 DIAGNOSIS — M5441 Lumbago with sciatica, right side: Secondary | ICD-10-CM | POA: Diagnosis not present

## 2015-08-06 HISTORY — DX: Other intervertebral disc degeneration, lumbar region without mention of lumbar back pain or lower extremity pain: M51.369

## 2015-08-06 HISTORY — DX: Other intervertebral disc degeneration, lumbar region: M51.36

## 2015-08-18 DIAGNOSIS — M5442 Lumbago with sciatica, left side: Secondary | ICD-10-CM | POA: Diagnosis not present

## 2015-08-18 DIAGNOSIS — M5136 Other intervertebral disc degeneration, lumbar region: Secondary | ICD-10-CM | POA: Diagnosis not present

## 2015-09-13 DIAGNOSIS — M545 Low back pain: Secondary | ICD-10-CM | POA: Diagnosis not present

## 2015-09-13 DIAGNOSIS — M5441 Lumbago with sciatica, right side: Secondary | ICD-10-CM | POA: Diagnosis not present

## 2015-09-24 DIAGNOSIS — M5441 Lumbago with sciatica, right side: Secondary | ICD-10-CM | POA: Diagnosis not present

## 2015-09-24 DIAGNOSIS — M545 Low back pain: Secondary | ICD-10-CM | POA: Diagnosis not present

## 2015-09-28 DIAGNOSIS — Z961 Presence of intraocular lens: Secondary | ICD-10-CM | POA: Diagnosis not present

## 2015-09-28 DIAGNOSIS — H401131 Primary open-angle glaucoma, bilateral, mild stage: Secondary | ICD-10-CM | POA: Diagnosis not present

## 2015-09-28 DIAGNOSIS — H01009 Unspecified blepharitis unspecified eye, unspecified eyelid: Secondary | ICD-10-CM | POA: Diagnosis not present

## 2015-09-28 DIAGNOSIS — H59812 Chorioretinal scars after surgery for detachment, left eye: Secondary | ICD-10-CM | POA: Diagnosis not present

## 2015-09-28 DIAGNOSIS — H1851 Endothelial corneal dystrophy: Secondary | ICD-10-CM | POA: Diagnosis not present

## 2015-10-15 DIAGNOSIS — M5136 Other intervertebral disc degeneration, lumbar region: Secondary | ICD-10-CM | POA: Diagnosis not present

## 2015-10-15 DIAGNOSIS — M5441 Lumbago with sciatica, right side: Secondary | ICD-10-CM | POA: Diagnosis not present

## 2015-11-06 DIAGNOSIS — M5441 Lumbago with sciatica, right side: Secondary | ICD-10-CM | POA: Diagnosis not present

## 2015-11-06 DIAGNOSIS — M5136 Other intervertebral disc degeneration, lumbar region: Secondary | ICD-10-CM | POA: Diagnosis not present

## 2016-01-25 DIAGNOSIS — H401131 Primary open-angle glaucoma, bilateral, mild stage: Secondary | ICD-10-CM | POA: Diagnosis not present

## 2016-02-13 ENCOUNTER — Telehealth: Payer: Self-pay | Admitting: Family Medicine

## 2016-02-13 ENCOUNTER — Emergency Department
Admission: EM | Admit: 2016-02-13 | Discharge: 2016-02-13 | Disposition: A | Discharge status: Home / Self Care | Payer: Medicare Other | Attending: Emergency Medicine | Admitting: Emergency Medicine

## 2016-02-13 DIAGNOSIS — Z5321 Procedure and treatment not carried out due to patient leaving prior to being seen by health care provider: Secondary | ICD-10-CM | POA: Insufficient documentation

## 2016-02-13 DIAGNOSIS — R51 Headache: Secondary | ICD-10-CM | POA: Insufficient documentation

## 2016-02-13 NOTE — Telephone Encounter (Signed)
[place]  Patient Name: Molly Chandler  DOB: [birth year]    Initial Comment Caller states having stabbing pains in back of head, can't get it to stop, has taken meds, but not helping   Nurse Assessment  Nurse: Wayne Sever, RN, Tillie Rung Date/Time (Eastern Time): 02/13/2016 9:06:52 AM  Confirm and document reason for call. If symptomatic, describe symptoms. You must click the next button to save text entered. ---Caller states she is having a stabbing pain on the back side of her head. It started yesterday and caller describes it as a knife stabbing her head. Denies any fever.  Has the patient traveled out of the country within the last 30 days? ---No  Does the patient have any new or worsening symptoms? ---Yes  Will a triage be completed? ---Yes  Related visit to physician within the last 2 weeks? ---No  Does the PT have any chronic conditions? (i.e. diabetes, asthma, etc.) ---No  Is this a behavioral health or substance abuse call? ---No     Guidelines    Guideline Title Affirmed Question Affirmed Notes  Headache [1] SEVERE headache (e.g., excruciating) AND [2] not improved after 2 hours of pain medicine    Final Disposition User   See Physician within 4 Hours (or PCP triage) Wayne Sever, RN, Tillie Rung    Comments  Caller will go to ED after work today. 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 No appointments left in office today. She will not be going to ED until after 4pm although this RN told her of the importance of getting see sooner   Referrals  [facility] - ED   Disagree/Comply: Disagree  Disagree/Comply Reason: Disagree with instructions

## 2016-02-13 NOTE — Telephone Encounter (Signed)
Pt states she will go later this afternoon after leaving work---she says she will go to [facility] ED

## 2016-02-13 NOTE — Telephone Encounter (Signed)
She did not go to ER. Can you call and check on her.

## 2016-02-13 NOTE — ED Notes (Signed)
Pt arrives to ER c/o right sided headache X 2 days. Pt states she does not get headaches. Pt denies vision changes, trouble speaking, weakness to any extremity. Pt alert and oriented X4, active, cooperative, pt in NAD. RR even and unlabored, color WNL.  Pt has taken headache powders with no relief per her report.

## 2016-02-14 ENCOUNTER — Telehealth: Payer: Self-pay | Admitting: Emergency Medicine

## 2016-02-14 NOTE — ED Notes (Signed)
Called patient due to lwot to inquire about condition and follow up plans. Pt still has headache.  I advised her to call her doctor.  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  I told her to come back here and be seen.  She said she will have her daughter bring her.

## 2016-03-25 ENCOUNTER — Telehealth: Payer: Self-pay | Admitting: *Deleted

## 2016-03-25 NOTE — Telephone Encounter (Signed)
Molly Chandler received from former patient "asking if we receive wig donations.  New pieces and full wigs will need to be cleaned." Thanked for the call and offer but no use or ability to recycled wigs at this time.

## 2016-04-11 ENCOUNTER — Other Ambulatory Visit: Payer: Self-pay | Admitting: Family Medicine

## 2016-04-11 DIAGNOSIS — Z1231 Encounter for screening mammogram for malignant neoplasm of breast: Secondary | ICD-10-CM

## 2016-04-21 ENCOUNTER — Ambulatory Visit
Admission: RE | Admit: 2016-04-21 | Discharge: 2016-04-21 | Disposition: A | Discharge status: Home / Self Care | Payer: Medicare Other | Source: Ambulatory Visit | Attending: Family Medicine | Admitting: Family Medicine

## 2016-04-21 DIAGNOSIS — Z1231 Encounter for screening mammogram for malignant neoplasm of breast: Secondary | ICD-10-CM

## 2016-04-28 DIAGNOSIS — M5136 Other intervertebral disc degeneration, lumbar region: Secondary | ICD-10-CM | POA: Diagnosis not present

## 2016-04-28 DIAGNOSIS — M5441 Lumbago with sciatica, right side: Secondary | ICD-10-CM | POA: Diagnosis not present

## 2016-05-07 ENCOUNTER — Ambulatory Visit (INDEPENDENT_AMBULATORY_CARE_PROVIDER_SITE_OTHER): Payer: Medicare Other | Admitting: Family Medicine

## 2016-05-07 ENCOUNTER — Other Ambulatory Visit: Payer: Self-pay | Admitting: Family Medicine

## 2016-05-07 ENCOUNTER — Encounter: Payer: Self-pay | Admitting: Family Medicine

## 2016-05-07 VITALS — BP 128/72 | HR 59 | Temp 97.7°F | Ht 59.0 in | Wt 167.2 lb

## 2016-05-07 DIAGNOSIS — N3946 Mixed incontinence: Secondary | ICD-10-CM | POA: Diagnosis not present

## 2016-05-07 DIAGNOSIS — Z23 Encounter for immunization: Secondary | ICD-10-CM | POA: Diagnosis not present

## 2016-05-07 DIAGNOSIS — Z0000 Encounter for general adult medical examination without abnormal findings: Secondary | ICD-10-CM

## 2016-05-07 DIAGNOSIS — K219 Gastro-esophageal reflux disease without esophagitis: Secondary | ICD-10-CM | POA: Diagnosis not present

## 2016-05-07 LAB — CBC WITH DIFFERENTIAL/PLATELET
Basophils Absolute: 0 10*3/uL (ref 0.0–0.1)
Basophils Relative: 0.4 % (ref 0.0–3.0)
Eosinophils Absolute: 0 10*3/uL (ref 0.0–0.7)
Eosinophils Relative: 0.6 % (ref 0.0–5.0)
HCT: 36.9 % (ref 36.0–46.0)
Hemoglobin: 12.4 g/dL (ref 12.0–15.0)
Lymphocytes Relative: 23.8 % (ref 12.0–46.0)
Lymphs Abs: 1.9 10*3/uL (ref 0.7–4.0)
MCHC: 33.6 g/dL (ref 30.0–36.0)
MCV: 91.6 fl (ref 78.0–100.0)
Monocytes Absolute: 0.6 10*3/uL (ref 0.1–1.0)
Monocytes Relative: 7.4 % (ref 3.0–12.0)
Neutro Abs: 5.3 10*3/uL (ref 1.4–7.7)
Neutrophils Relative %: 67.8 % (ref 43.0–77.0)
Platelets: 228 10*3/uL (ref 150.0–400.0)
RBC: 4.03 Mil/uL (ref 3.87–5.11)
RDW: 13.6 % (ref 11.5–15.5)
WBC: 7.8 10*3/uL (ref 4.0–10.5)

## 2016-05-07 LAB — COMPREHENSIVE METABOLIC PANEL
ALT: 12 U/L (ref 0–35)
AST: 18 U/L (ref 0–37)
Albumin: 4.2 g/dL (ref 3.5–5.2)
Alkaline Phosphatase: 57 U/L (ref 39–117)
BUN: 21 mg/dL (ref 6–23)
CO2: 30 mEq/L (ref 19–32)
Calcium: 9.3 mg/dL (ref 8.4–10.5)
Chloride: 104 mEq/L (ref 96–112)
Creatinine, Ser: 0.79 mg/dL (ref 0.40–1.20)
GFR: 74.57 mL/min (ref >60.00)
Glucose, Bld: 97 mg/dL (ref 70–99)
Potassium: 3.8 mEq/L (ref 3.5–5.1)
Sodium: 139 mEq/L (ref 135–145)
Total Bilirubin: 0.5 mg/dL (ref 0.2–1.2)
Total Protein: 7 g/dL (ref 6.0–8.3)

## 2016-05-07 LAB — LIPID PANEL
Cholesterol: 191 mg/dL (ref 0–200)
HDL: 54 mg/dL (ref >39.00)
LDL Cholesterol: 126 mg/dL — ABNORMAL HIGH (ref 0–99)
NonHDL: 136.88
Total CHOL/HDL Ratio: 4
Triglycerides: 53 mg/dL (ref 0.0–149.0)
VLDL: 10.6 mg/dL (ref 0.0–40.0)

## 2016-05-07 LAB — TSH: TSH: 1.55 u[IU]/mL (ref 0.35–4.50)

## 2016-05-07 NOTE — Patient Instructions (Signed)
Great to see you. We will call you with your results from today. 

## 2016-05-07 NOTE — Progress Notes (Signed)
Subjective:   Patient ID: Molly Chandler, female    DOB: 05-10-1935, 81 y.o.   MRN: [id number]  Molly Chandler is a pleasant 81 y.o. year old female who presents to clinic today with Annual Exam (Medicare)  on 05/07/2016  HPI:  I have personally reviewed the Medicare Annual Wellness questionnaire and have noted 1. The patient's medical and social history 2. Their use of alcohol, tobacco or illicit drugs 3. Their current medications and supplements 4. The patient's functional ability including ADL's, fall risks, home safety risks and hearing or visual             impairment. 5. Diet and physical activities 6. Evidence for depression or mood disorders  Zoster 04/27/14 Td 09/26/2011 Mammogram 04/21/16 prevnar 13 05/07/15 Colonoscopy 12/19/10- Dr. Loreta Ave  End of life wishes discussed and updated in Social History.  The roster of all physicians providing medical care to patient - is listed in the Care[employer] section of the chart. Lab Results  Component Value Date   WBC 5.4 05/07/2015   HGB 12.7 05/07/2015   HCT 38.4 05/07/2015   MCV 92.0 05/07/2015   PLT 205.0 05/07/2015   Lab Results  Component Value Date   ALT 10 07/17/2015   AST 17 07/17/2015   ALKPHOS 52 07/17/2015   BILITOT 0.5 07/17/2015   Lab Results  Component Value Date   CHOL 214 (H) 07/17/2015   CHOL 228 (H) 05/07/2015   CHOL 196 04/27/2014   Lab Results  Component Value Date   HDL 42.40 07/17/2015   HDL 49.40 05/07/2015   HDL 45.60 04/27/2014   Lab Results  Component Value Date   LDLCALC 158 (H) 07/17/2015   LDLCALC 165 (H) 05/07/2015   LDLCALC 140 (H) 04/27/2014   Lab Results  Component Value Date   TRIG 71.0 07/17/2015   TRIG 66.0 05/07/2015   TRIG 50.0 04/27/2014   Lab Results  Component Value Date   CHOLHDL 5 07/17/2015   CHOLHDL 5 05/07/2015   CHOLHDL 4 04/27/2014    Lab Results  Component Value Date   ALT 10 07/17/2015   AST 17 07/17/2015   ALKPHOS 52 07/17/2015   BILITOT 0.5  07/17/2015    Lab Results  Component Value Date   CREATININE 0.74 07/17/2015   Lab Results  Component Value Date   TSH 1.80 05/07/2015     Current Outpatient Prescriptions on File Prior to Visit  Medication Sig Dispense Refill  . gabapentin (NEURONTIN) 100 MG capsule Take 1 capsule (100 mg total) by mouth at bedtime. 30 capsule 1  . Multiple Minerals (CALCIUM-MAGNESIUM-ZINC) TABS Take 1 tablet by mouth at bedtime.    . Multiple Vitamin (MULTIVITAMIN) tablet Take 1 tablet by mouth daily.    . naproxen (NAPROSYN) 500 MG tablet Take 500 mg by mouth at bedtime.    . Probiotic Product (PROBIOTIC DAILY PO) Take by mouth.    . Travoprost, BAK Free, (TRAVATAMN) 0.004 % SOLN ophthalmic solution Place 1 drop into both eyes at bedtime.       No current facility-administered medications on file prior to visit.     Allergies  Allergen Reactions  . Amoxicillin Diarrhea and Nausea And Vomiting    GI intolerance.   . Metoclopramide Hcl Other (See Comments)    Stroke-like symptoms (dose too high?)    Past Medical History:  Diagnosis Date  . Cataract   . Lymphoma (HCC)    rectal cancer  . Osteoporosis    osteopenia  No past surgical history on file.  No family history on file.  Social History   Social History  . Marital status: Married    Spouse name: N/A  . Number of children: 5  . Years of education: N/A   Occupational History  . Retired Retired   Social History Main Topics  . Smoking status: Former Games developer  . Smokeless tobacco: Not on file  . Alcohol use No  . Drug use: Unknown  . Sexual activity: Not on file   Other Topics Concern  . Not on file   Social History Narrative   Desires CPR   Would not want life support prolonged if futile   The PMH, PSH, Social History, Family History, Medications, and allergies have been reviewed in [facility], and have been updated if relevant.    Review of Systems  Constitutional: Negative.   HENT: Negative.   Eyes: Negative.    Respiratory: Negative.   Cardiovascular: Negative.   Gastrointestinal: Negative.   Endocrine: Negative.   Genitourinary: Negative.   Musculoskeletal: Negative.   Skin: Negative.   Allergic/Immunologic: Negative.   Neurological: Negative.   Hematological: Negative.   Psychiatric/Behavioral: Negative.   All other systems reviewed and are negative.      Objective:    BP 128/72   Pulse (!) 59   Temp 97.7 F (36.5 C) (Oral)   Ht 4\' 11"  (1.499 m)   Wt 167 lb 4 oz (75.9 kg)   SpO2 98%   BMI 33.78 kg/m   Wt Readings from Last 3 Encounters:  05/07/16 167 lb 4 oz (75.9 kg)  02/13/16 168 lb (76.2 kg)  05/07/15 172 lb (78 kg)    Physical Exam  Constitutional: She is oriented to person, place, and time. She appears well-developed and well-nourished. No distress.  HENT:  Head: Normocephalic and atraumatic.  Eyes: Conjunctivae are normal.  Neck: Normal range of motion.  Cardiovascular: Normal rate, regular rhythm and normal heart sounds.   Pulmonary/Chest: Effort normal and breath sounds normal. No respiratory distress. She has no wheezes. She has no rales.  Abdominal: Soft.  Musculoskeletal: Normal range of motion. She exhibits no edema.  Neurological: She is alert and oriented to person, place, and time. No cranial nerve deficit.  Skin: Skin is warm and dry.  Psychiatric: She has a normal mood and affect. Her behavior is normal. Judgment and thought content normal.  Nursing note and vitals reviewed.         Assessment & Plan:   Medicare annual wellness visit, subsequent - Plan: CBC with Differential/Platelet, Comprehensive metabolic panel, Lipid panel, TSH  Need for influenza vaccination - Plan: Flu Vaccine QUAD 36+ mos PF IM (Fluarix & Fluzone Quad PF)  Gastroesophageal reflux disease, esophagitis presence not specified  URINARY INCONTINENCE, MIXED No Follow-up on file.

## 2016-05-07 NOTE — Progress Notes (Signed)
Pre visit review using our clinic review tool, if applicable. No additional management support is needed unless otherwise documented below in the visit note. 

## 2016-05-07 NOTE — Assessment & Plan Note (Signed)
The patients weight, height, BMI and visual acuity have been recorded in the chart.  Cognitive function assessed.   I have made referrals, counseling and provided education to the patient based review of the above and I have provided the pt with a written personalized care plan for preventive services.  Influenza vaccine given today. 

## 2016-05-08 ENCOUNTER — Encounter: Payer: Self-pay | Admitting: *Deleted

## 2016-05-08 ENCOUNTER — Telehealth: Payer: Self-pay

## 2016-05-08 MED ORDER — CIPROFLOXACIN HCL 500 MG PO TABS: 500.0000 mg | ORAL_TABLET | Freq: Two times a day (BID) | ORAL | 0 refills | Status: DC

## 2016-05-08 NOTE — Telephone Encounter (Signed)
Pt left v/m; pt was seen 05/07/16 for annual exam and today pt has burning upon urination and request med to [facility]. Pt request cb when med called in.

## 2016-05-08 NOTE — Telephone Encounter (Signed)
She did leave a urine sample and it does not look like we ran it. Because of this, will send rx for abx to her pharmacy.  Please keep [place] updated.

## 2016-05-10 ENCOUNTER — Emergency Department (HOSPITAL_COMMUNITY): Payer: Medicare Other

## 2016-05-10 ENCOUNTER — Emergency Department (HOSPITAL_COMMUNITY)
Admission: EM | Admit: 2016-05-10 | Discharge: 2016-05-10 | Disposition: A | Discharge status: Home / Self Care | Payer: Medicare Other | Attending: Emergency Medicine | Admitting: Emergency Medicine

## 2016-05-10 ENCOUNTER — Encounter (HOSPITAL_COMMUNITY): Payer: Self-pay

## 2016-05-10 DIAGNOSIS — S0003XA Contusion of scalp, initial encounter: Secondary | ICD-10-CM | POA: Diagnosis not present

## 2016-05-10 DIAGNOSIS — S0990XA Unspecified injury of head, initial encounter: Secondary | ICD-10-CM | POA: Insufficient documentation

## 2016-05-10 DIAGNOSIS — S199XXA Unspecified injury of neck, initial encounter: Secondary | ICD-10-CM | POA: Diagnosis not present

## 2016-05-10 DIAGNOSIS — Y999 Unspecified external cause status: Secondary | ICD-10-CM | POA: Insufficient documentation

## 2016-05-10 DIAGNOSIS — Z85038 Personal history of other malignant neoplasm of large intestine: Secondary | ICD-10-CM | POA: Insufficient documentation

## 2016-05-10 DIAGNOSIS — Y929 Unspecified place or not applicable: Secondary | ICD-10-CM | POA: Insufficient documentation

## 2016-05-10 DIAGNOSIS — Z87891 Personal history of nicotine dependence: Secondary | ICD-10-CM | POA: Insufficient documentation

## 2016-05-10 DIAGNOSIS — Z85048 Personal history of other malignant neoplasm of rectum, rectosigmoid junction, and anus: Secondary | ICD-10-CM | POA: Diagnosis not present

## 2016-05-10 DIAGNOSIS — Y9301 Activity, walking, marching and hiking: Secondary | ICD-10-CM | POA: Insufficient documentation

## 2016-05-10 DIAGNOSIS — W01198A Fall on same level from slipping, tripping and stumbling with subsequent striking against other object, initial encounter: Secondary | ICD-10-CM | POA: Insufficient documentation

## 2016-05-10 DIAGNOSIS — Z79899 Other long term (current) drug therapy: Secondary | ICD-10-CM | POA: Diagnosis not present

## 2016-05-10 DIAGNOSIS — S0083XA Contusion of other part of head, initial encounter: Secondary | ICD-10-CM | POA: Diagnosis not present

## 2016-05-10 NOTE — ED Provider Notes (Signed)
Molly Chandler DEPT Provider Note   CSN: [id number] Arrival date & time: 05/10/16  1318     History   Chief Complaint Chief Complaint  Patient presents with  . Fall    HPI Molly Chandler is a 81 y.o. female.  81 year old female here after mechanical fall just prior to arrival. States that she was walking down a slick wheelchair ramp and lost her balance and fell backwards onto her head. No loss of consciousness. Complains of 2 hematomas to her occiput as well as some left-sided neck pain. Denies any weakness in arms or legs. No hip or new back pain. No weakness in arms or legs. Pain characterized as sharp at her left neck and worse with movement better with rest. No treatment use prior to arrival.      Past Medical History:  Diagnosis Date  . Cataract   . Lymphoma ([place])    rectal cancer  . Osteoporosis    osteopenia    Patient Active Problem List   Diagnosis Date Noted  . Impingement syndrome of left shoulder 02/26/2015  . Medicare annual wellness visit, subsequent 02/13/2015  . Spinal stenosis 04/01/2012  . GERD (gastroesophageal reflux disease) 09/26/2011  . MEMORY LOSS 09/16/2010  . URINARY INCONTINENCE, MIXED 08/23/2010  . LYMPHOMA 05/30/2010  . Disorder of bone and cartilage 05/30/2010  . History of malignant neoplasm of large intestine 05/30/2010    History reviewed. No pertinent surgical history.  OB History    No data available       Home Medications    Prior to Admission medications   Medication Sig Start Date End Date Taking? Authorizing Provider  ciprofloxacin (CIPRO) 500 MG tablet Take 1 tablet (500 mg total) by mouth 2 (two) times daily. 05/08/16   Lucille Passy, MD  gabapentin (NEURONTIN) 100 MG capsule Take 1 capsule (100 mg total) by mouth at bedtime. 01/18/15   Jearld Fenton, NP  Multiple Minerals (CALCIUM-MAGNESIUM-ZINC) TABS Take 1 tablet by mouth at bedtime.    Historical Provider, MD  Multiple Vitamin (MULTIVITAMIN) tablet Take 1  tablet by mouth daily.    Historical Provider, MD  naproxen (NAPROSYN) 500 MG tablet Take 500 mg by mouth at bedtime.    Historical Provider, MD  Probiotic Product (PROBIOTIC DAILY PO) Take by mouth.    Historical Provider, MD  Travoprost, BAK Free, (TRAVATAMN) 0.004 % SOLN ophthalmic solution Place 1 drop into both eyes at bedtime.      Historical Provider, MD    Family History No family history on file.  Social History Social History  Substance Use Topics  . Smoking status: Former Research scientist (life sciences)  . Smokeless tobacco: Never Used  . Alcohol use No     Allergies   Amoxicillin and Metoclopramide hcl   Review of Systems Review of Systems  All other systems reviewed and are negative.    Physical Exam Updated Vital Signs BP 158/80   Pulse (!) 58   Temp 97.8 F (36.6 C) (Oral)   Resp 18   Ht 4\' 11"  (1.499 m)   Wt 74.6 kg   SpO2 100%   BMI 33.20 kg/m   Physical Exam  Constitutional: She is oriented to person, place, and time. She appears well-developed and well-nourished.  Non-toxic appearance. No distress.  HENT:  Head: Atraumatic.    Eyes: Conjunctivae, EOM and lids are normal. Pupils are equal, round, and reactive to light.  Neck: Normal range of motion. Neck supple. No tracheal deviation present. No thyroid mass present.  Cardiovascular: Normal rate, regular rhythm and normal heart sounds.  Exam reveals no gallop.   No murmur heard. Pulmonary/Chest: Effort normal and breath sounds normal. No stridor. No respiratory distress. She has no decreased breath sounds. She has no wheezes. She has no rhonchi. She has no rales.  Abdominal: Soft. Normal appearance and bowel sounds are normal. She exhibits no distension. There is no tenderness. There is no rebound and no CVA tenderness.  Musculoskeletal: Normal range of motion. She exhibits no edema or tenderness.  Neurological: She is alert and oriented to person, place, and time. She has normal strength. She displays no tremor. No  cranial nerve deficit or sensory deficit. She exhibits normal muscle tone. GCS eye subscore is 4. GCS verbal subscore is 5. GCS motor subscore is 6.  Skin: Skin is warm and dry. No abrasion and no rash noted.  Psychiatric: She has a normal mood and affect. Her speech is normal and behavior is normal.  Nursing note and vitals reviewed.    ED Treatments / Results  Labs (all labs ordered are listed, but only abnormal results are displayed) Labs Reviewed - No data to display  EKG  EKG Interpretation None       Radiology No results found.  Procedures Procedures (including critical care time)  Medications Ordered in ED Medications - No data to display   Initial Impression / Assessment and Plan / ED Course  I have reviewed the triage vital signs and the nursing notes.  Pertinent labs & imaging results that were available during my care of the patient were reviewed by me and considered in my medical decision making (see chart for details).  Clinical Course    CT scan of head and neck without acute findings. Patient without neurological deficits and stable for discharge  Final Clinical Impressions(s) / ED Diagnoses   Final diagnoses:  None    New Prescriptions New Prescriptions   No medications on file     [name], MD 05/10/16 1545

## 2016-05-10 NOTE — ED Triage Notes (Signed)
Patient states that she fell backwards after slipping and falling on wheelchair ramp striking back of head. No loc. Bruising noted to back of head. c-collar applied at triage due to neck pain. Dizziness and nausea, no blurred vision. Daughter concerned that patient has had a few falls the past several weeks. Alert and oriented

## 2016-05-14 ENCOUNTER — Encounter: Payer: Self-pay | Admitting: Family Medicine

## 2016-05-14 DIAGNOSIS — Z1211 Encounter for screening for malignant neoplasm of colon: Secondary | ICD-10-CM | POA: Diagnosis not present

## 2016-05-14 DIAGNOSIS — Z1212 Encounter for screening for malignant neoplasm of rectum: Secondary | ICD-10-CM | POA: Diagnosis not present

## 2016-05-22 DIAGNOSIS — M5441 Lumbago with sciatica, right side: Secondary | ICD-10-CM | POA: Diagnosis not present

## 2016-05-22 DIAGNOSIS — M5136 Other intervertebral disc degeneration, lumbar region: Secondary | ICD-10-CM | POA: Diagnosis not present

## 2016-05-22 LAB — COLOGUARD: Cologuard: NEGATIVE

## 2016-05-23 NOTE — Telephone Encounter (Signed)
FYI:   Patient calls today to report that she was contacted by [place] to come pick up a prescription for Cipro 500mg  and doesn't know why it was called in.    This Probation officer contacted Pharmacy to verify that this was the R/X sent in on 05/08/16 by Dr. Deborra Medina.  Patient never picked it up and when cleaning off shelves today Pharmacist called patient for one last reminder before removing the medication for pick up.  Patient is not symptomatic and does not wish or need to take any medication at this point.  I notified Pharmacy to cancel the medication.  I also reviewed her lab results from 05/07/16 with her at her request according to Dr. Hulen Shouts interpretation.

## 2016-05-28 ENCOUNTER — Telehealth: Payer: Self-pay | Admitting: Family Medicine

## 2016-05-28 NOTE — Telephone Encounter (Signed)
Pt was told by cologuard that her results are back and pt is asking about them. cb number for pt is [phone] Thanks

## 2016-05-28 NOTE — Telephone Encounter (Signed)
I dont see results in my box or scanned under media.  Can we request another copy?

## 2016-05-29 NOTE — Telephone Encounter (Signed)
Pt called checking on her cologuard results Best number [phone]

## 2016-05-30 DIAGNOSIS — H401131 Primary open-angle glaucoma, bilateral, mild stage: Secondary | ICD-10-CM | POA: Diagnosis not present

## 2016-05-30 NOTE — Telephone Encounter (Signed)
Spoke to pt and advised per Dr Deborra Medina. States she will request another copy be sent to office.

## 2016-06-02 NOTE — Telephone Encounter (Signed)
Pt called regarding cologuard results. Please call pt back  Thanks

## 2016-06-02 NOTE — Telephone Encounter (Signed)
I still do not see them in my box.

## 2016-06-02 NOTE — Telephone Encounter (Signed)
Patient left another voicemail wanting to know if you have gotten the results of her cologuard test.

## 2016-06-03 NOTE — Telephone Encounter (Signed)
Results were negative

## 2016-06-04 DIAGNOSIS — R8271 Bacteriuria: Secondary | ICD-10-CM | POA: Diagnosis not present

## 2016-06-04 DIAGNOSIS — R300 Dysuria: Secondary | ICD-10-CM | POA: Diagnosis not present

## 2016-06-30 DIAGNOSIS — R350 Frequency of micturition: Secondary | ICD-10-CM | POA: Diagnosis not present

## 2016-06-30 DIAGNOSIS — N3020 Other chronic cystitis without hematuria: Secondary | ICD-10-CM | POA: Diagnosis not present

## 2016-08-18 DIAGNOSIS — M5136 Other intervertebral disc degeneration, lumbar region: Secondary | ICD-10-CM | POA: Diagnosis not present

## 2016-08-18 DIAGNOSIS — M5441 Lumbago with sciatica, right side: Secondary | ICD-10-CM | POA: Diagnosis not present

## 2016-09-19 DIAGNOSIS — H60333 Swimmer's ear, bilateral: Secondary | ICD-10-CM | POA: Diagnosis not present

## 2016-09-19 DIAGNOSIS — R1313 Dysphagia, pharyngeal phase: Secondary | ICD-10-CM | POA: Diagnosis not present

## 2016-09-23 DIAGNOSIS — M25561 Pain in right knee: Secondary | ICD-10-CM | POA: Diagnosis not present

## 2016-09-23 DIAGNOSIS — M5441 Lumbago with sciatica, right side: Secondary | ICD-10-CM | POA: Diagnosis not present

## 2016-09-29 DIAGNOSIS — H31092 Other chorioretinal scars, left eye: Secondary | ICD-10-CM | POA: Diagnosis not present

## 2016-09-29 DIAGNOSIS — H401131 Primary open-angle glaucoma, bilateral, mild stage: Secondary | ICD-10-CM | POA: Diagnosis not present

## 2016-09-29 DIAGNOSIS — Z9842 Cataract extraction status, left eye: Secondary | ICD-10-CM | POA: Diagnosis not present

## 2016-09-29 DIAGNOSIS — H52223 Regular astigmatism, bilateral: Secondary | ICD-10-CM | POA: Diagnosis not present

## 2016-09-29 DIAGNOSIS — Z9841 Cataract extraction status, right eye: Secondary | ICD-10-CM | POA: Diagnosis not present

## 2016-10-06 DIAGNOSIS — M1711 Unilateral primary osteoarthritis, right knee: Secondary | ICD-10-CM | POA: Diagnosis not present

## 2016-10-06 DIAGNOSIS — M25562 Pain in left knee: Secondary | ICD-10-CM | POA: Diagnosis not present

## 2016-10-06 DIAGNOSIS — M25551 Pain in right hip: Secondary | ICD-10-CM | POA: Diagnosis not present

## 2016-10-06 DIAGNOSIS — M545 Low back pain: Secondary | ICD-10-CM | POA: Diagnosis not present

## 2016-10-21 DIAGNOSIS — M25561 Pain in right knee: Secondary | ICD-10-CM | POA: Diagnosis not present

## 2016-10-21 DIAGNOSIS — M5441 Lumbago with sciatica, right side: Secondary | ICD-10-CM | POA: Diagnosis not present

## 2016-11-17 DIAGNOSIS — M1711 Unilateral primary osteoarthritis, right knee: Secondary | ICD-10-CM | POA: Diagnosis not present

## 2016-11-24 DIAGNOSIS — M1711 Unilateral primary osteoarthritis, right knee: Secondary | ICD-10-CM | POA: Diagnosis not present

## 2016-12-01 DIAGNOSIS — M1711 Unilateral primary osteoarthritis, right knee: Secondary | ICD-10-CM | POA: Diagnosis not present

## 2017-01-05 DIAGNOSIS — M545 Low back pain: Secondary | ICD-10-CM | POA: Diagnosis not present

## 2017-01-28 DIAGNOSIS — H401123 Primary open-angle glaucoma, left eye, severe stage: Secondary | ICD-10-CM | POA: Diagnosis not present

## 2017-01-28 DIAGNOSIS — H401111 Primary open-angle glaucoma, right eye, mild stage: Secondary | ICD-10-CM | POA: Diagnosis not present

## 2017-02-23 DIAGNOSIS — M5441 Lumbago with sciatica, right side: Secondary | ICD-10-CM | POA: Diagnosis not present

## 2017-03-18 ENCOUNTER — Other Ambulatory Visit: Payer: Self-pay | Admitting: Family Medicine

## 2017-03-18 DIAGNOSIS — Z1231 Encounter for screening mammogram for malignant neoplasm of breast: Secondary | ICD-10-CM

## 2017-04-06 DIAGNOSIS — M25561 Pain in right knee: Secondary | ICD-10-CM | POA: Diagnosis not present

## 2017-04-06 DIAGNOSIS — M1711 Unilateral primary osteoarthritis, right knee: Secondary | ICD-10-CM | POA: Diagnosis not present

## 2017-04-06 DIAGNOSIS — M545 Low back pain: Secondary | ICD-10-CM | POA: Diagnosis not present

## 2017-04-08 DIAGNOSIS — M5441 Lumbago with sciatica, right side: Secondary | ICD-10-CM | POA: Diagnosis not present

## 2017-04-08 DIAGNOSIS — M25561 Pain in right knee: Secondary | ICD-10-CM | POA: Diagnosis not present

## 2017-04-22 DIAGNOSIS — M545 Low back pain: Secondary | ICD-10-CM | POA: Diagnosis not present

## 2017-04-22 DIAGNOSIS — M5441 Lumbago with sciatica, right side: Secondary | ICD-10-CM | POA: Diagnosis not present

## 2017-04-27 ENCOUNTER — Ambulatory Visit: Payer: Self-pay

## 2017-04-29 DIAGNOSIS — G629 Polyneuropathy, unspecified: Secondary | ICD-10-CM | POA: Insufficient documentation

## 2017-04-29 DIAGNOSIS — M5441 Lumbago with sciatica, right side: Secondary | ICD-10-CM | POA: Diagnosis not present

## 2017-04-29 HISTORY — DX: Polyneuropathy, unspecified: G62.9

## 2017-04-30 ENCOUNTER — Ambulatory Visit
Admission: RE | Admit: 2017-04-30 | Discharge: 2017-04-30 | Disposition: A | Discharge status: Home / Self Care | Payer: Medicare Other | Source: Ambulatory Visit | Attending: Family Medicine | Admitting: Family Medicine

## 2017-04-30 DIAGNOSIS — Z1231 Encounter for screening mammogram for malignant neoplasm of breast: Secondary | ICD-10-CM

## 2017-05-08 ENCOUNTER — Other Ambulatory Visit: Payer: Self-pay | Admitting: Family Medicine

## 2017-05-08 ENCOUNTER — Ambulatory Visit (INDEPENDENT_AMBULATORY_CARE_PROVIDER_SITE_OTHER): Payer: Medicare Other

## 2017-05-08 VITALS — BP 124/80 | HR 67 | Temp 98.3°F | Ht 58.75 in | Wt 172.2 lb

## 2017-05-08 DIAGNOSIS — Z0000 Encounter for general adult medical examination without abnormal findings: Secondary | ICD-10-CM

## 2017-05-08 DIAGNOSIS — E7800 Pure hypercholesterolemia, unspecified: Secondary | ICD-10-CM

## 2017-05-08 DIAGNOSIS — Z23 Encounter for immunization: Secondary | ICD-10-CM

## 2017-05-08 LAB — COMPREHENSIVE METABOLIC PANEL
ALT: 10 U/L (ref 0–35)
AST: 17 U/L (ref 0–37)
Albumin: 4 g/dL (ref 3.5–5.2)
Alkaline Phosphatase: 55 U/L (ref 39–117)
BUN: 22 mg/dL (ref 6–23)
CO2: 29 mEq/L (ref 19–32)
Calcium: 9.5 mg/dL (ref 8.4–10.5)
Chloride: 105 mEq/L (ref 96–112)
Creatinine, Ser: 0.65 mg/dL (ref 0.40–1.20)
GFR: 93.15 mL/min (ref >60.00)
Glucose, Bld: 95 mg/dL (ref 70–99)
Potassium: 4.3 mEq/L (ref 3.5–5.1)
Sodium: 140 mEq/L (ref 135–145)
Total Bilirubin: 0.4 mg/dL (ref 0.2–1.2)
Total Protein: 6.5 g/dL (ref 6.0–8.3)

## 2017-05-08 LAB — LIPID PANEL
Cholesterol: 191 mg/dL (ref 0–200)
HDL: 41.8 mg/dL (ref >39.00)
LDL Cholesterol: 134 mg/dL — ABNORMAL HIGH (ref 0–99)
NonHDL: 149.09
Total CHOL/HDL Ratio: 5
Triglycerides: 76 mg/dL (ref 0.0–149.0)
VLDL: 15.2 mg/dL (ref 0.0–40.0)

## 2017-05-08 LAB — CBC
HCT: 38.2 % (ref 36.0–46.0)
Hemoglobin: 12.6 g/dL (ref 12.0–15.0)
MCHC: 33.1 g/dL (ref 30.0–36.0)
MCV: 94.6 fl (ref 78.0–100.0)
Platelets: 200 10*3/uL (ref 150.0–400.0)
RBC: 4.04 Mil/uL (ref 3.87–5.11)
RDW: 13.4 % (ref 11.5–15.5)
WBC: 4.3 10*3/uL (ref 4.0–10.5)

## 2017-05-08 LAB — TSH: TSH: 2.21 u[IU]/mL (ref 0.35–4.50)

## 2017-05-08 NOTE — Progress Notes (Signed)
Pre visit review using our clinic review tool, if applicable. No additional management support is needed unless otherwise documented below in the visit note. 

## 2017-05-08 NOTE — Progress Notes (Signed)
PCP notes:   Health maintenance:  Flu vaccine - addressed PPSV23 - administered  Abnormal screenings:   Hearing - failed Depression score:13 Mini-Cog score: 17  Patient concerns:   Neuropathy - patient reports neuropathy has become increasingly worse over past 12 mths  Nurse concerns:  None  Next PCP appt:   05/19/17 @ 0730  I reviewed health advisor's note, was available for consultation, and agree with documentation and plan. Loura Pardon MD

## 2017-05-08 NOTE — Patient Instructions (Signed)
Molly Chandler , Thank you for taking time to come for your Medicare Wellness Visit. I appreciate your ongoing commitment to your health goals. Please review the following plan we discussed and let me know if I can assist you in the future.   These are the goals we discussed: Goals    . Increase physical activity          Starting 05/08/2017, I will attempt to do stretching exercises for at least 30 min daily.        This is a list of the screening recommended for you and due dates:  Health Maintenance  Topic Date Due  . Flu Shot  12/06/2017*  . Mammogram  04/30/2018  . Tetanus Vaccine  09/25/2021  . DEXA scan (bone density measurement)  Completed  . Pneumonia vaccines  Completed  *Topic was postponed. The date shown is not the original due date.   Preventive Care for Adults  A healthy lifestyle and preventive care can promote health and wellness. Preventive health guidelines for adults include the following key practices.  . A routine yearly physical is a good way to check with your health care provider about your health and preventive screening. It is a chance to share any concerns and updates on your health and to receive a thorough exam.  . Visit your dentist for a routine exam and preventive care every 6 months. Brush your teeth twice a day and floss once a day. Good oral hygiene prevents tooth decay and gum disease.  . The frequency of eye exams is based on your age, health, family medical history, use  of contact lenses, and other factors. Follow your health care provider's ecommendations for frequency of eye exams.  . Eat a healthy diet. Foods like vegetables, fruits, whole grains, low-fat dairy products, and lean protein foods contain the nutrients you need without too many calories. Decrease your intake of foods high in solid fats, added sugars, and salt. Eat the right amount of calories for you. Get information about a proper diet from your health care provider, if  necessary.  . Regular physical exercise is one of the most important things you can do for your health. Most adults should get at least 150 minutes of moderate-intensity exercise (any activity that increases your heart rate and causes you to sweat) each week. In addition, most adults need muscle-strengthening exercises on 2 or more days a week.  Silver Sneakers may be a benefit available to you. To determine eligibility, you may visit the website: www.silversneakers.com or contact program at 413-081-2103 Mon-Fri between 8AM-8PM.   . Maintain a healthy weight. The body mass index (BMI) is a screening tool to identify possible weight problems. It provides an estimate of body fat based on height and weight. Your health care provider can find your BMI and can help you achieve or maintain a healthy weight.   For adults 20 years and older: ? A BMI below 18.5 is considered underweight. ? A BMI of 18.5 to 24.9 is normal. ? A BMI of 25 to 29.9 is considered overweight. ? A BMI of 30 and above is considered obese.   . Maintain normal blood lipids and cholesterol levels by exercising and minimizing your intake of saturated fat. Eat a balanced diet with plenty of fruit and vegetables. Blood tests for lipids and cholesterol should begin at age 46 and be repeated every 5 years. If your lipid or cholesterol levels are high, you are over 50, or you are at  high risk for heart disease, you may need your cholesterol levels checked more frequently. Ongoing high lipid and cholesterol levels should be treated with medicines if diet and exercise are not working.  . If you smoke, find out from your health care provider how to quit. If you do not use tobacco, please do not start.  . If you choose to drink alcohol, please do not consume more than 2 drinks per day. One drink is considered to be 12 ounces (355 mL) of beer, 5 ounces (148 mL) of wine, or 1.5 ounces (44 mL) of liquor.  . If you are 37-25 years old, ask your  health care provider if you should take aspirin to prevent strokes.  . Use sunscreen. Apply sunscreen liberally and repeatedly throughout the day. You should seek shade when your shadow is shorter than you. Protect yourself by wearing long sleeves, pants, a wide-brimmed hat, and sunglasses year round, whenever you are outdoors.  . Once a month, do a whole body skin exam, using a mirror to look at the skin on your back. Tell your health care provider of new moles, moles that have irregular borders, moles that are larger than a pencil eraser, or moles that have changed in shape or color.

## 2017-05-08 NOTE — Progress Notes (Signed)
Subjective:   Molly Chandler is a 82 y.o. female who presents for Medicare Annual (Subsequent) preventive examination.  Review of Systems:  N/A Cardiac Risk Factors include: advanced age (>67men, >4 women);obesity (BMI >30kg/m2)     Objective:     Vitals: BP 124/80 (BP Location: Right Arm, Patient Position: Sitting, Cuff Size: Normal)   Pulse 67   Temp 98.3 F (36.8 C) (Oral)   Ht 4' 10.75" (1.492 m) Comment: no shoes  Wt 172 lb 4 oz (78.1 kg)   SpO2 98%   BMI 35.09 kg/m   Body mass index is 35.09 kg/m.   Tobacco History  Smoking Status  . Former Smoker  Smokeless Tobacco  . Never Used     Counseling given: No   Past Medical History:  Diagnosis Date  . Cataract   . Lymphoma ([place])    rectal cancer  . Osteoporosis    osteopenia   History reviewed. No pertinent surgical history. History reviewed. No pertinent family history. History  Sexual Activity  . Sexual activity: Not on file    Outpatient Encounter Prescriptions as of 05/08/2017  Medication Sig  . Ascorbic Acid (VITAMIN C PO) Take by mouth.  . Cholecalciferol (VITAMIN D PO) Take by mouth.  . Multiple Minerals (CALCIUM-MAGNESIUM-ZINC) TABS Take 1 tablet by mouth at bedtime.  . Multiple Vitamin (MULTIVITAMIN) tablet Take 1 tablet by mouth daily.  . Probiotic Product (PROBIOTIC DAILY PO) Take by mouth.  . Travoprost, BAK Free, (TRAVATAMN) 0.004 % SOLN ophthalmic solution Place 1 drop into both eyes at bedtime.    Marland Kitchen VITAMIN E PO Take by mouth.  . [DISCONTINUED] ciprofloxacin (CIPRO) 500 MG tablet Take 1 tablet (500 mg total) by mouth 2 (two) times daily.  . [DISCONTINUED] naproxen (NAPROSYN) 500 MG tablet Take 500 mg by mouth at bedtime.  . gabapentin (NEURONTIN) 100 MG capsule Take 1 capsule (100 mg total) by mouth at bedtime. (Patient not taking: Reported on 05/08/2017)   No facility-administered encounter medications on file as of 05/08/2017.     Activities of Daily Living In your present state  of health, do you have any difficulty performing the following activities: 05/08/2017  Hearing? N  Vision? N  Difficulty concentrating or making decisions? N  Walking or climbing stairs? N  Dressing or bathing? N  Doing errands, shopping? N  Preparing Food and eating ? N  Using the Toilet? N  In the past six months, have you accidently leaked urine? Y  Do you have problems with loss of bowel control? N  Managing your Medications? N  Managing your Finances? N  Housekeeping or managing your Housekeeping? N  Some recent data might be hidden    Patient Care Team: Lucille Passy, MD as PCP - General Regal, Tamala Fothergill, DPM as Consulting Physician (Podiatry) Marin Olp Rudell Cobb, MD as Consulting Physician (Oncology) Thelma Comp, OD as Consulting Physician (Optometry)    Assessment:     Hearing Screening   125Hz  250Hz  500Hz  1000Hz  2000Hz  3000Hz  4000Hz  6000Hz  8000Hz   Right ear:   0 0 40  0    Left ear:   0 0 0  0    Vision Screening Comments: Last vision exam in May 2018 with Dr. Maryruth Hancock B.    Exercise Activities and Dietary recommendations Current Exercise Habits: Home exercise routine, Type of exercise: stretching, Time (Minutes): 30, Frequency (Times/Week): 2, Weekly Exercise (Minutes/Week): 60, Intensity: Mild, Exercise limited by: None identified  Goals    . Increase physical  activity          Starting 05/08/2017, I will attempt to do stretching exercises for at least 30 min daily.       Fall Risk Fall Risk  05/08/2017 05/07/2016 05/07/2015 02/13/2015 04/27/2014  Falls in the past year? No Yes No No No  Number falls in past yr: - 1 - - -   Depression Screen PHQ 2/9 Scores 05/08/2017 05/07/2016 05/07/2015 02/13/2015  PHQ - 2 Score 3 0 0 0  PHQ- 9 Score 13 - - -     Cognitive Function MMSE - Mini Mental State Exam 05/08/2017  Orientation to time 5  Orientation to Place 5  Registration 3  Attention/ Calculation 0  Recall 0  Recall-comments pt was unable to recall 0 of 3 words    Language- name 2 objects 0  Language- repeat 1  Language- follow 3 step command 3  Language- read & follow direction 0  Write a sentence 0  Copy design 0  Total score 17     PLEASE NOTE: A Mini-Cog screen was completed. Maximum score is 20. A value of 0 denotes this part of Folstein MMSE was not completed or the patient failed this part of the Mini-Cog screening.   Mini-Cog Screening Orientation to Time - Max 5 pts Orientation to Place - Max 5 pts Registration - Max 3 pts Recall - Max 3 pts Language Repeat - Max 1 pts Language Follow 3 Step Command - Max 3 pts     Immunization History  Administered Date(s) Administered  . Influenza Split 04/27/2013  . Influenza Whole 05/09/2010  . Influenza,inj,Quad PF,6+ Mos 05/07/2015, 05/07/2016  . Pneumococcal Conjugate-13 05/07/2015  . Pneumococcal Polysaccharide-23 05/08/2017  . Td 09/26/2011  . Zoster 04/27/2014   Screening Tests Health Maintenance  Topic Date Due  . INFLUENZA VACCINE  12/06/2017 (Originally 04/08/2017)  . MAMMOGRAM  04/30/2018  . TETANUS/TDAP  09/25/2021  . DEXA SCAN  Completed  . PNA vac Low Risk Adult  Completed      Plan:       I have personally reviewed and noted the following in the patient's chart:   . Medical and social history . Use of alcohol, tobacco or illicit drugs  . Current medications and supplements . Functional ability and status . Nutritional status . Physical activity . Advanced directives . List of other physicians . Hospitalizations, surgeries, and ER visits in previous 12 months . Vitals . Screenings to include cognitive, depression, and falls . Referrals and appointments  In addition, I have reviewed and discussed with patient certain preventive protocols, quality metrics, and best practice recommendations. A written personalized care plan for preventive services as well as general preventive health recommendations were provided to patient.     Lindell Noe,  LPN  12/31/9561

## 2017-05-14 ENCOUNTER — Encounter: Payer: Medicare Other | Admitting: Family Medicine

## 2017-05-19 ENCOUNTER — Ambulatory Visit (INDEPENDENT_AMBULATORY_CARE_PROVIDER_SITE_OTHER): Payer: Medicare Other | Admitting: Family Medicine

## 2017-05-19 ENCOUNTER — Encounter: Payer: Self-pay | Admitting: Family Medicine

## 2017-05-19 VITALS — BP 110/70 | HR 63 | Temp 98.1°F | Resp 16 | Ht 59.5 in | Wt 172.0 lb

## 2017-05-19 DIAGNOSIS — Z0000 Encounter for general adult medical examination without abnormal findings: Secondary | ICD-10-CM

## 2017-05-19 DIAGNOSIS — Z01419 Encounter for gynecological examination (general) (routine) without abnormal findings: Secondary | ICD-10-CM | POA: Insufficient documentation

## 2017-05-19 NOTE — Assessment & Plan Note (Signed)
Reviewed preventive care protocols, scheduled due services, and updated immunizations Discussed nutrition, exercise, diet, and healthy lifestyle.  

## 2017-05-19 NOTE — Progress Notes (Signed)
Subjective:   Patient ID: Molly Chandler, female    DOB: [birth year], 82 y.o.   MRN: [id number]  Molly Chandler is a pleasant 82 y.o. year old female who presents to clinic today with Annual Exam  on 05/19/2017  HPI:  Annual medicare wellness visit with Lu Duffel, RN on 05/08/17. Note reviewed.   PHQ 9 score of 13 but she denies feeling depressed and does not want rx or psychotherapy. She feels that her answers tot he depression screen were misinterpreted.  There are no preventive care reminders to display for this patient.  Lab Results  Component Value Date   CHOL 191 05/08/2017   HDL 41.80 05/08/2017   LDLCALC 134 (H) 05/08/2017   LDLDIRECT 156.4 05/30/2010   TRIG 76.0 05/08/2017   CHOLHDL 5 05/08/2017   Lab Results  Component Value Date   NA 140 05/08/2017   K 4.3 05/08/2017   CL 105 05/08/2017   CO2 29 05/08/2017   Lab Results  Component Value Date   WBC 4.3 05/08/2017   HGB 12.6 05/08/2017   HCT 38.2 05/08/2017   MCV 94.6 05/08/2017   PLT 200.0 05/08/2017   Lab Results  Component Value Date   TSH 2.21 05/08/2017   Hepatic Function Latest Ref Rng & Units 05/08/2017 05/07/2016 07/17/2015  Total Protein 6.0 - 8.3 g/dL 6.5 7.0 6.9  Albumin 3.5 - 5.2 g/dL 4.0 4.2 4.1  AST 0 - 37 U/L 17 18 17   ALT 0 - 35 U/L 10 12 10   Alk Phosphatase 39 - 117 U/L 55 57 52  Total Bilirubin 0.2 - 1.2 mg/dL 0.4 0.5 0.5  Bilirubin, Direct 0.0 - 0.3 mg/dL - - -   Current Outpatient Prescriptions on File Prior to Visit  Medication Sig Dispense Refill  . Ascorbic Acid (VITAMIN C PO) Take by mouth.    . Cholecalciferol (VITAMIN D PO) Take by mouth.    . Multiple Minerals (CALCIUM-MAGNESIUM-ZINC) TABS Take 1 tablet by mouth at bedtime.    . Multiple Vitamin (MULTIVITAMIN) tablet Take 1 tablet by mouth daily.    . Probiotic Product (PROBIOTIC DAILY PO) Take by mouth.    . Travoprost, BAK Free, (TRAVATAMN) 0.004 % SOLN ophthalmic solution Place 1 drop into both eyes at  bedtime.      Marland Kitchen VITAMIN E PO Take by mouth.     No current facility-administered medications on file prior to visit.     Allergies  Allergen Reactions  . Amoxicillin Diarrhea and Nausea And Vomiting    GI intolerance.   . Metoclopramide Hcl Other (See Comments)    Stroke-like symptoms (dose too high?)    Past Medical History:  Diagnosis Date  . Cataract   . Lymphoma ([facility])    rectal cancer  . Osteoporosis    osteopenia    No past surgical history on file.  No family history on file.  Social History   Social History  . Marital status: Married    Spouse name: N/A  . Number of children: 5  . Years of education: N/A   Occupational History  . Retired Retired   Social History Main Topics  . Smoking status: Former Games developer  . Smokeless tobacco: Never Used  . Alcohol use No  . Drug use: Unknown  . Sexual activity: Not on file   Other Topics Concern  . Not on file   Social History Narrative   Desires CPR   Would not want life support prolonged if futile  The PMH, PSH, Social History, Family History, Medications, and allergies have been reviewed in [facility], and have been updated if relevant.   Review of Systems  Constitutional: Negative.   HENT: Negative.   Eyes: Negative.   Respiratory: Negative.   Cardiovascular: Negative.   Gastrointestinal: Negative.   Endocrine: Negative.   Genitourinary: Negative.   Musculoskeletal: Negative.   Allergic/Immunologic: Negative.   Neurological: Negative.   Hematological: Negative.   Psychiatric/Behavioral: Negative.   All other systems reviewed and are negative.      Objective:    BP 110/70   Pulse 63   Temp 98.1 F (36.7 C) (Oral)   Resp 16   Ht 4' 11.5" (1.511 m)   Wt 172 lb (78 kg)   SpO2 96%   BMI 34.16 kg/m    Physical Exam    General:  Well-developed,well-nourished,in no acute distress; alert,appropriate and cooperative throughout examination Head:  normocephalic and atraumatic.   Eyes:  vision  grossly intact, PERRL Ears:  R ear normal and L ear normal externally, TMs clear bilaterally Nose:  no external deformity.   Mouth:  good dentition.   Neck:  No deformities, masses, or tenderness noted. Breasts:  No mass, nodules, thickening, tenderness, bulging, retraction, inflamation, nipple discharge or skin changes noted.   Lungs:  Normal respiratory effort, chest expands symmetrically. Lungs are clear to auscultation, no crackles or wheezes. Heart:  Normal rate and regular rhythm. S1 and S2 normal without gallop, murmur, click, rub or other extra sounds. Abdomen:  Bowel sounds positive,abdomen soft and non-tender without masses, organomegaly or hernias noted. Msk:  No deformity or scoliosis noted of thoracic or lumbar spine.   Extremities:  No clubbing, cyanosis, edema, or deformity noted with normal full range of motion of all joints.   Neurologic:  alert & oriented X3 and gait normal.   Skin:  Intact without suspicious lesions or rashes Cervical Nodes:  No lymphadenopathy noted Axillary Nodes:  No palpable lymphadenopathy Psych:  Cognition and judgment appear intact. Alert and cooperative with normal attention span and concentration. No apparent delusions, illusions, hallucinations      Assessment & Plan:   Well woman exam No Follow-up on file.

## 2017-06-03 DIAGNOSIS — H401111 Primary open-angle glaucoma, right eye, mild stage: Secondary | ICD-10-CM | POA: Diagnosis not present

## 2017-06-03 DIAGNOSIS — H401123 Primary open-angle glaucoma, left eye, severe stage: Secondary | ICD-10-CM | POA: Diagnosis not present

## 2017-06-09 ENCOUNTER — Telehealth: Payer: Self-pay

## 2017-06-09 DIAGNOSIS — G629 Polyneuropathy, unspecified: Secondary | ICD-10-CM | POA: Diagnosis not present

## 2017-06-09 DIAGNOSIS — M5441 Lumbago with sciatica, right side: Secondary | ICD-10-CM | POA: Diagnosis not present

## 2017-06-09 NOTE — Telephone Encounter (Signed)
Pt wants to know when she can get the new shingles shot and flu shot; pt had pneumovax on 05/08/17; pt will ck with [facility] to see if has shingrix and cb for order to be sent to pharmacy. Pt will also get flushot at pharmacy.

## 2017-06-26 ENCOUNTER — Telehealth: Payer: Self-pay | Admitting: Family Medicine

## 2017-06-26 NOTE — Telephone Encounter (Signed)
Best number [phone]  Pt called wanting to know if it is time for a colonoscopy or have the color guard test Please advise

## 2017-06-26 NOTE — Telephone Encounter (Signed)
cologuard is an excellent screening tool if she prefers something less invasive.

## 2017-06-26 NOTE — Telephone Encounter (Signed)
Pt aware/agrees/faxed order with ins and demo info to [facility]/she is aware they will be contacting her/thx dmf

## 2017-07-07 NOTE — Telephone Encounter (Signed)
I do not yet see cologuard results in chart.

## 2017-07-07 NOTE — Telephone Encounter (Signed)
Gave pt information to call about Cologuard/thx dmf

## 2017-07-07 NOTE — Telephone Encounter (Signed)
Pt. Stated that her stool is floating and  she hasn't heard anything yet from [facility] about sched. Her test

## 2017-07-07 NOTE — Telephone Encounter (Signed)
Copied from [place] #[id number]. Topic: Inquiry >> Jul 07, 2017  1:36 PM Malena Catholic I, NT wrote: Reason for CRM: pt called said you need to sign the fax and send it bank she said you know what paper

## 2017-07-15 NOTE — Telephone Encounter (Signed)
Patient said she needs the colorguard signed before they can send it to her. PLEASE CALL HER. She says she has called several times and the dr is not signing it.

## 2017-07-15 NOTE — Telephone Encounter (Signed)
I have faxed in signed cologuard form/thx dmf

## 2017-09-22 ENCOUNTER — Other Ambulatory Visit: Payer: Self-pay | Admitting: Gastroenterology

## 2017-09-22 DIAGNOSIS — R1310 Dysphagia, unspecified: Secondary | ICD-10-CM

## 2017-09-25 ENCOUNTER — Ambulatory Visit
Admission: RE | Admit: 2017-09-25 | Discharge: 2017-09-25 | Disposition: A | Discharge status: Home / Self Care | Payer: Medicare Other | Source: Ambulatory Visit | Attending: Gastroenterology | Admitting: Gastroenterology

## 2017-09-25 DIAGNOSIS — R1310 Dysphagia, unspecified: Secondary | ICD-10-CM

## 2017-10-19 ENCOUNTER — Telehealth: Payer: Self-pay | Admitting: Family Medicine

## 2017-10-19 DIAGNOSIS — R1310 Dysphagia, unspecified: Secondary | ICD-10-CM

## 2017-10-19 NOTE — Telephone Encounter (Signed)
Does she mean a colonoscopy?  I am not sure why she is requesting an endoscopy.  Can you please get more information from patient.

## 2017-10-19 NOTE — Telephone Encounter (Signed)
Copied from [place]. Topic: Referral - Request >> Oct 19, 2017  9:10 AM Hewitt Shorts wrote: Reason for CRM: pt is calling to see if her referral for an endoscopy can be done at a hospital not Dr,. Mann-if this is done in office it will cost pt 300 dollars  Pt is wanting to go to the hospital   Best number

## 2017-10-20 NOTE — Telephone Encounter (Signed)
TA-I spoke with pt/she states that Dr. Collene Mares ordered testing due to her having a swallowing issue/after all of that was done he said she needs to have an Endoscopy done but he does them in his office and she is on social security and cannot afford the $300.00/she is needing it done in a hospital because there will not be a cost for her/she says that she has to make herself throw-up because she can't get food down/plz advise/thx dmf

## 2017-10-20 NOTE — Telephone Encounter (Signed)
I cannot order endoscopies but I can refer her to another GI doctor if she would like.  Referral placed.

## 2017-10-22 ENCOUNTER — Telehealth: Payer: Self-pay | Admitting: Family Medicine

## 2017-10-22 NOTE — Telephone Encounter (Addendum)
We are unable to refer to a office that schedules these procedures in the hospital. These are considered to be outpatient and are done in the office. Per GI doctors patient must meet certain criteria, which our office does not know. If it is done in the hospital and they are sent home after the procedure. This is there office protocol which we can not make decisions for. Also since patient is established with a GI doctor, all records would have to be received before we can refer to another GI. I hope this information helps.

## 2017-10-22 NOTE — Telephone Encounter (Signed)
error 

## 2017-10-29 NOTE — Telephone Encounter (Signed)
Pt aware/she is going to call around and to GI offices to see if there is someone that does endoscopies in a hospital or hospital OP setting/she will call us back to let us know/I told her that we would have to also get a copy of all GI records before being able to refer to a new GI as well/thx dmf

## 2017-10-30 ENCOUNTER — Telehealth: Payer: Self-pay

## 2017-10-30 NOTE — Telephone Encounter (Signed)
Pt has appt to transfer to Dr Diona Browner 05/28/18; pt last saw Dr Deborra Medina on 05/19/17 for annual exam. Will sent to flow coordinator at [facility].

## 2017-10-30 NOTE — Telephone Encounter (Signed)
TA-Pt is asking for a referral for Stem Cell Treatment for chronic back pain/plz advise/thx dmf

## 2017-10-30 NOTE — Telephone Encounter (Signed)
Okay with me to transfer.

## 2017-10-30 NOTE — Telephone Encounter (Signed)
TA-Plz see pt req below/not sure I understand her request/plz advise/thx dmf

## 2017-10-30 NOTE — Telephone Encounter (Signed)
That is outside of my scope of practice and am unsure of how to refer for something like that.

## 2017-10-30 NOTE — Telephone Encounter (Signed)
Copied from [place] [phone]. Topic: General - Other >> Oct 30, 2017 10:23 AM Valla Leaver wrote: Reason for CRM: Patient was seeing Dr. Deborra Medina but is transferring to [facility] in September. She wants to know if Dr. Diona Browner can order a stem cell treatment for her chronic back pain? Advised patient someone would call her back to discuss at earliest convenience.

## 2017-10-30 NOTE — Telephone Encounter (Signed)
Pt has not seen Dr Diona Browner yet and pt is asking if can order a stem cell treatment for chronic back pain.Please advise.

## 2017-11-02 NOTE — Telephone Encounter (Signed)
Pt aware/but she has an appointment scheduled/thx dmf

## 2017-11-17 ENCOUNTER — Emergency Department
Admission: EM | Admit: 2017-11-17 | Discharge: 2017-11-17 | Disposition: A | Discharge status: Home / Self Care | Payer: Medicare Other | Attending: Emergency Medicine | Admitting: Emergency Medicine

## 2017-11-17 ENCOUNTER — Emergency Department: Payer: Medicare Other

## 2017-11-17 ENCOUNTER — Encounter: Payer: Self-pay | Admitting: Emergency Medicine

## 2017-11-17 ENCOUNTER — Ambulatory Visit: Payer: Self-pay | Admitting: *Deleted

## 2017-11-17 DIAGNOSIS — Z87891 Personal history of nicotine dependence: Secondary | ICD-10-CM | POA: Diagnosis not present

## 2017-11-17 DIAGNOSIS — R519 Headache, unspecified: Secondary | ICD-10-CM

## 2017-11-17 DIAGNOSIS — R51 Headache: Secondary | ICD-10-CM | POA: Diagnosis present

## 2017-11-17 DIAGNOSIS — Z8572 Personal history of non-Hodgkin lymphomas: Secondary | ICD-10-CM | POA: Insufficient documentation

## 2017-11-17 LAB — URINALYSIS, COMPLETE (UACMP) WITH MICROSCOPIC
Bacteria, UA: NONE SEEN
Bilirubin Urine: NEGATIVE
Glucose, UA: NEGATIVE mg/dL
Hgb urine dipstick: NEGATIVE
Ketones, ur: NEGATIVE mg/dL
Nitrite: NEGATIVE
Protein, ur: NEGATIVE mg/dL
Specific Gravity, Urine: 1.015 (ref 1.005–1.030)
pH: 5 (ref 5.0–8.0)

## 2017-11-17 LAB — BASIC METABOLIC PANEL
Anion gap: 9 (ref 5–15)
BUN: 12 mg/dL (ref 6–20)
CO2: 23 mmol/L (ref 22–32)
Calcium: 9.1 mg/dL (ref 8.9–10.3)
Chloride: 104 mmol/L (ref 101–111)
Creatinine, Ser: 0.68 mg/dL (ref 0.44–1.00)
GFR calc Af Amer: >60 mL/min (ref >60)
GFR calc non Af Amer: >60 mL/min (ref >60)
Glucose, Bld: 123 mg/dL — ABNORMAL HIGH (ref 65–99)
Potassium: 4.1 mmol/L (ref 3.5–5.1)
Sodium: 136 mmol/L (ref 135–145)

## 2017-11-17 LAB — CBC
HCT: 38.9 % (ref 35.0–47.0)
Hemoglobin: 12.9 g/dL (ref 12.0–16.0)
MCH: 30 pg (ref 26.0–34.0)
MCHC: 33.1 g/dL (ref 32.0–36.0)
MCV: 90.7 fL (ref 80.0–100.0)
Platelets: 207 10*3/uL (ref 150–440)
RBC: 4.29 MIL/uL (ref 3.80–5.20)
RDW: 13.9 % (ref 11.5–14.5)
WBC: 4.5 10*3/uL (ref 3.6–11.0)

## 2017-11-17 MED ORDER — TRAMADOL HCL 50 MG PO TABS: 50.0000 mg | ORAL_TABLET | Freq: Four times a day (QID) | ORAL | 0 refills | Status: DC | PRN

## 2017-11-17 NOTE — ED Triage Notes (Signed)
First nurse note: Patient to the ER for c/o left sided severe headache that began at approx 0000 this am. Denies any unilateral weakness, blurry vision, dizziness. States pain is mainly behind left eye.

## 2017-11-17 NOTE — ED Notes (Signed)
Pt taken to her car via wheelchair. VSS. NAD. Discharge instructions, RX and follow up reviewed with pt. All questions answered.

## 2017-11-17 NOTE — Telephone Encounter (Signed)
Per chart review pt at [facility] ED.

## 2017-11-17 NOTE — ED Provider Notes (Signed)
[facility] Emergency Department Provider Note       Time seen: ----------------------------------------- 12:08 PM on 11/17/2017 -----------------------------------------   I have reviewed the triage vital signs and the nursing notes.  HISTORY   Chief Complaint Headache    HPI Molly Chandler is a 83 y.o. female with a history of lymphoma, osteoporosis, spinal stenosis who presents to the ED for headache.  Patient reports a stabbing sensation to the left side of her head posteriorly.  She has never had this happen before, reports her scalp is sore from it.  She has a history of glaucoma but denies any visual symptoms or pain.  She denies any weakness or blurry vision.  Symptoms have been going on for about 3 days she states.  Past Medical History:  Diagnosis Date  . Cataract   . Lymphoma ([place])    rectal cancer  . Osteoporosis    osteopenia    Patient Active Problem List   Diagnosis Date Noted  . Well woman exam 05/19/2017  . Impingement syndrome of left shoulder 02/26/2015  . Spinal stenosis 04/01/2012  . GERD (gastroesophageal reflux disease) 09/26/2011  . MEMORY LOSS 09/16/2010  . URINARY INCONTINENCE, MIXED 08/23/2010  . LYMPHOMA 05/30/2010  . Disorder of bone and cartilage 05/30/2010  . History of malignant neoplasm of large intestine 05/30/2010    History reviewed. No pertinent surgical history.  Allergies Amoxicillin and Metoclopramide hcl  Social History Social History   Tobacco Use  . Smoking status: Former Research scientist (life sciences)  . Smokeless tobacco: Never Used  Substance Use Topics  . Alcohol use: No  . Drug use: Not on file    Review of Systems Constitutional: Negative for fever. Eyes: Negative for vision changes ENT:  Negative for congestion, sore throat Cardiovascular: Negative for chest pain. Respiratory: Negative for shortness of breath. Gastrointestinal: Negative for abdominal pain, vomiting and diarrhea. Musculoskeletal:  Negative for back pain. Skin: Negative for rash. Neurological: Positive for headache  All systems negative/normal/unremarkable except as stated in the HPI  ____________________________________________   PHYSICAL EXAM:  VITAL SIGNS: ED Triage Vitals [11/17/17 0941]  Enc Vitals Group     BP (!) 149/74     Pulse Rate 73     Resp 18     Temp 97.9 F (36.6 C)     Temp Source Oral     SpO2 98 %     Weight 172 lb (78 kg)     Height 4\' 11"  (1.499 m)     Head Circumference      Peak Flow      Pain Score 9     Pain Loc      Pain Edu?      Excl. in [place]?    Constitutional: Alert and oriented. Well appearing and in no distress. Eyes: Conjunctivae are normal. Normal extraocular movements. ENT   Head: Normocephalic and atraumatic.  Left-sided parietal scalp tenderness   Nose: No congestion/rhinnorhea.   Mouth/Throat: Mucous membranes are moist.   Neck: No stridor. Cardiovascular: Normal rate, regular rhythm. No murmurs, rubs, or gallops. Respiratory: Normal respiratory effort without tachypnea nor retractions. Breath sounds are clear and equal bilaterally. No wheezes/rales/rhonchi. Gastrointestinal: Soft and nontender. Normal bowel sounds Musculoskeletal: Nontender with normal range of motion in extremities. No lower extremity tenderness nor edema. Neurologic:  Normal speech and language. No gross focal neurologic deficits are appreciated.  Skin:  Skin is warm, dry and intact. No rash noted. Psychiatric: Mood and affect are normal. Speech and behavior  are normal.  ____________________________________________  ED COURSE:  As part of my medical decision making, I reviewed the following data within the [facility] History obtained from family if available, nursing notes, old chart and ekg, as well as notes from prior ED visits. Patient presented for headache, we will assess with labs and imaging as indicated at this time.    Procedures ____________________________________________   LABS (pertinent positives/negatives)  Labs Reviewed  BASIC METABOLIC PANEL - Abnormal; Notable for the following components:      Result Value   Glucose, Bld 123 (*)    All other components within normal limits  URINALYSIS, COMPLETE (UACMP) WITH MICROSCOPIC - Abnormal; Notable for the following components:   Color, Urine YELLOW (*)    APPearance CLEAR (*)    Leukocytes, UA TRACE (*)    Squamous Epithelial / LPF 0-5 (*)    All other components within normal limits  CBC    RADIOLOGY Images were viewed by me  CT head is normal  ____________________________________________  DIFFERENTIAL DIAGNOSIS   Headache, contusion, temporal arteritis, glaucoma, subdural hematoma  FINAL ASSESSMENT AND PLAN  Headache   Plan: The patient had presented for headache and a stabbing left sided scalp pain that is not in the temporal region. Patient's labs are normal. Patient's imaging is also normal.  She will be discharged with pain medicine and is encouraged to have close outpatient follow-up with her doctor.  No clear etiology is identified at this time.   Laurence Aly, MD   Note: This note was generated in part or whole with voice recognition software. Voice recognition is usually quite accurate but there are transcription errors that can and very often do occur. I apologize for any typographical errors that were not detected and corrected.     Earleen Newport, MD 11/17/17 1235

## 2017-11-17 NOTE — Telephone Encounter (Signed)
Patient is calling to report that she started having severe pain in her left side of her head and neck last night.She states she is having pain in her left eye as well. She states she is still having pain this morning- not quite as severe as last night- she also has a history of glaucoma. Per protocol patient should be evaluated at ED- she agrees to go.  Reason for Disposition . Severe pain in one eye  Answer Assessment - Initial Assessment Questions 1. LOCATION: "Where does it hurt?"      Left side head 2. ONSET: "When did the headache start?" (Minutes, hours or days)      Started late last night 3. PATTERN: "Does the pain come and go, or has it been constant since it started?"     Constant- patient states pain is not as bad as last night- but she feels it when she coughs or moves 4. SEVERITY: "How bad is the pain?" and "What does it keep you from doing?"  (e.g., Scale 1-10; mild, moderate, or severe)   - MILD (1-3): doesn't interfere with normal activities    - MODERATE (4-7): interferes with normal activities or awakens from sleep    - SEVERE (8-10): excruciating pain, unable to do any normal activities        Not as bad as it was last night-5-6 5. RECURRENT SYMPTOM: "Have you ever had headaches before?" If so, ask: "When was the last time?" and "What happened that time?"      No- last time patient had a headache like this she was 27 6. CAUSE: "What do you think is causing the headache?"     unknown 7. MIGRAINE: "Have you been diagnosed with migraine headaches?" If so, ask: "Is this headache similar?"      Not since age 9 8. HEAD INJURY: "Has there been any recent injury to the head?"      no 9. OTHER SYMPTOMS: "Do you have any other symptoms?" (fever, stiff neck, eye pain, sore throat, cold symptoms)     Pain down neck 10. PREGNANCY: "Is there any chance you are pregnant?" "When was your last menstrual period?"       n/a  Protocols used: HEADACHE-A-AH

## 2017-11-17 NOTE — Telephone Encounter (Signed)
FYI: TA-Pt at [facility] ED/thx dmf

## 2017-11-17 NOTE — ED Triage Notes (Signed)
Pt reports pain to the left side of her head since last night. Pt reports whole left side of her face is sore from it. Pt reports hx of glaucoma but denies any new symptoms. Denies weakness, blurred vision. Pt ambulatory with steady gait.

## 2017-12-03 LAB — HM COLONOSCOPY

## 2017-12-21 ENCOUNTER — Encounter: Payer: Self-pay | Admitting: Internal Medicine

## 2018-01-01 ENCOUNTER — Encounter: Payer: Self-pay | Admitting: Family Medicine

## 2018-01-11 ENCOUNTER — Encounter: Payer: Self-pay | Admitting: *Deleted

## 2018-01-11 ENCOUNTER — Ambulatory Visit: Payer: Self-pay | Admitting: *Deleted

## 2018-01-11 NOTE — Telephone Encounter (Signed)
This encounter was created in error - please disregard.

## 2018-01-11 NOTE — Telephone Encounter (Signed)
Pt  Recently started symptoms of urinary frequency burning as well as discomfort when she urinates . Symptoms just started approx  thirty minutes ago Appointment made with Dr Diona Browner tommorow.Precautions advised   Reason for Disposition . Age > 50 years  Answer Assessment - Initial Assessment Questions 1. SEVERITY: "How bad is the pain?"  (e.g., Scale 1-10; mild, moderate, or severe)   - MILD (1-3): complains slightly about urination hurting   - MODERATE (4-7): interferes with normal activities     - SEVERE (8-10): excruciating, unwilling or unable to urinate because of the pain         Mild  2. FREQUENCY: "How many times have you had painful urination today?"       Started 30  mins  ago 3. PATTERN: "Is pain present every time you urinate or just sometimes?"        Yes 4. ONSET: "When did the painful urination start?"        30 mins    5. FEVER: "Do you have a fever?" If so, ask: "What is your temperature, how was it measured, and when did it start?"        No   6. PAST UTI: "Have you had a urine infection before?" If so, ask: "When was the last time?" and "What happened that time?"         Yes   Years ago  Does not remember anti biotics   7. CAUSE: "What do you think is causing the painful urination?"  (e.g., UTI, scratch, Herpes sore)         UTI  8. OTHER SYMPTOMS: "Do you have any other symptoms?" (e.g., flank pain, vaginal discharge, genital sores, urgency, blood in urine)      Urgency  9. PREGNANCY: "Is there any chance you are pregnant?" "When was your last menstrual period?"    N/a  Protocols used: [facility]

## 2018-01-12 ENCOUNTER — Other Ambulatory Visit: Payer: Self-pay

## 2018-01-12 ENCOUNTER — Ambulatory Visit: Payer: Medicare Other | Admitting: Family Medicine

## 2018-01-12 ENCOUNTER — Encounter: Payer: Self-pay | Admitting: Family Medicine

## 2018-01-12 VITALS — BP 118/70 | HR 56 | Temp 97.7°F | Ht 58.75 in | Wt 171.5 lb

## 2018-01-12 DIAGNOSIS — R300 Dysuria: Secondary | ICD-10-CM | POA: Diagnosis not present

## 2018-01-12 LAB — POC URINALSYSI DIPSTICK (AUTOMATED)
Bilirubin, UA: NEGATIVE
Blood, UA: NEGATIVE
Glucose, UA: NEGATIVE
Ketones, UA: NEGATIVE
Nitrite, UA: NEGATIVE
Protein, UA: NEGATIVE
Spec Grav, UA: 1.015 (ref 1.010–1.025)
Urobilinogen, UA: 0.2 E.U./dL
pH, UA: 7 (ref 5.0–8.0)

## 2018-01-12 NOTE — Patient Instructions (Signed)
Continue to push fluids.  We will call you with the culture results in next few days.  Call us sooner if new fever, or N/V , worsening symtpoms.

## 2018-01-12 NOTE — Progress Notes (Signed)
Subjective:    Patient ID: Molly Chandler, female    DOB: 1935-02-10, 83 y.o.   MRN: [id number]  Dysuria   This is a new problem. The current episode started yesterday. The problem occurs intermittently. The problem has been gradually worsening. The quality of the pain is described as burning. The pain is mild. There has been no fever. She is not sexually active. There is no history of pyelonephritis. Associated symptoms include frequency and urgency. Pertinent negatives include no chills, discharge, flank pain, hematuria, hesitancy, nausea or vomiting. Associated symptoms comments:  She has incontinence. She has tried increased fluids for the symptoms. The treatment provided significant relief. There is no history of catheterization, recurrent UTIs, a single kidney or a urological procedure.   UA today shows large LE, otw negative.  Blood pressure 118/70, pulse (!) 56, temperature 97.7 F (36.5 C), temperature source Oral, height 4' 10.75" (1.492 m), weight 171 lb 8 oz (77.8 kg). Social History /Family History/Past Medical History reviewed in detail and updated in EMR if needed.  Review of Systems  Constitutional: Negative for chills.  Gastrointestinal: Negative for nausea and vomiting.  Genitourinary: Positive for dysuria, frequency and urgency. Negative for flank pain, hematuria and hesitancy.       Objective:   Physical Exam  Constitutional: Vital signs are normal. She appears well-developed and well-nourished. She is cooperative.  Non-toxic appearance. She does not appear ill. No distress.  HENT:  Head: Normocephalic.  Right Ear: Hearing, tympanic membrane, external ear and ear canal normal. Tympanic membrane is not erythematous, not retracted and not bulging.  Left Ear: Hearing, tympanic membrane, external ear and ear canal normal. Tympanic membrane is not erythematous, not retracted and not bulging.  Nose: No mucosal edema or rhinorrhea. Right sinus exhibits no maxillary sinus  tenderness and no frontal sinus tenderness. Left sinus exhibits no maxillary sinus tenderness and no frontal sinus tenderness.  Mouth/Throat: Uvula is midline, oropharynx is clear and moist and mucous membranes are normal.  Eyes: Pupils are equal, round, and reactive to light. Conjunctivae, EOM and lids are normal. Lids are everted and swept, no foreign bodies found.  Neck: Trachea normal and normal range of motion. Neck supple. Carotid bruit is not present. No thyroid mass and no thyromegaly present.  Cardiovascular: Normal rate, regular rhythm, S1 normal, S2 normal, normal heart sounds, intact distal pulses and normal pulses. Exam reveals no gallop and no friction rub.  No murmur heard. Pulmonary/Chest: Effort normal and breath sounds normal. No tachypnea. No respiratory distress. She has no decreased breath sounds. She has no wheezes. She has no rhonchi. She has no rales.  Abdominal: Soft. Normal appearance and bowel sounds are normal. There is no tenderness.  Neurological: She is alert.  Skin: Skin is warm, dry and intact. No rash noted.  Psychiatric: Her speech is normal and behavior is normal. Judgment and thought content normal. Her mood appears not anxious. Cognition and memory are normal. She does not exhibit a depressed mood.          Assessment & Plan:

## 2018-01-12 NOTE — Assessment & Plan Note (Signed)
Symptoms basically resolved with increase in water. Given LE  Large in urine.. Send for culture.

## 2018-01-13 LAB — URINE CULTURE
MICRO NUMBER:: 90555650
SPECIMEN QUALITY:: ADEQUATE

## 2018-01-14 ENCOUNTER — Ambulatory Visit: Payer: Self-pay | Admitting: Primary Care

## 2018-02-04 ENCOUNTER — Ambulatory Visit: Payer: Medicare Other | Admitting: Internal Medicine

## 2018-02-04 ENCOUNTER — Encounter: Payer: Self-pay | Admitting: Internal Medicine

## 2018-02-04 DIAGNOSIS — M816 Localized osteoporosis [Lequesne]: Secondary | ICD-10-CM

## 2018-02-04 DIAGNOSIS — K219 Gastro-esophageal reflux disease without esophagitis: Secondary | ICD-10-CM

## 2018-02-04 DIAGNOSIS — C8590 Non-Hodgkin lymphoma, unspecified, unspecified site: Secondary | ICD-10-CM

## 2018-02-04 DIAGNOSIS — M1990 Unspecified osteoarthritis, unspecified site: Secondary | ICD-10-CM

## 2018-02-04 NOTE — Progress Notes (Signed)
HPI  Pt presents to the clinic today to establish care and for management of the conditions listed below. She is transferring care from Dr. Deborra Medina.  Osteoporosis: She has tried and failed biophosphinates in the past. She takes Calcium and Vit D daily. She tries to get weight bearing exercise daily. Last bone density was in 2013.  GERD: She is not sure what triggers this. She is taking Omeprazole as prescribed. She denies breakthrough symptoms   Hx of Lymphoma: She thinks was triggered by Roundup. She is s/p chemo. She does not follow with hematology.  Osteoarthritis: Mainly in back and knees. She reports she recently had injections with Dr. Maryjean Ka.   Flu: 06/2017 Tetanus: 09/2011 Pneumovax: 04/2017 Prevnar: 04/2015 Zostovax: 04/2014 Shingrix: never Mammogram: 04/2017 Bone Density Exam: 04/2012 Colon Screening: 12/2017 Vision Screening: every 4 months Dentist: as needed   Past Medical History:  Diagnosis Date  . Cataract   . Lymphoma ([place])    rectal cancer  . Osteoporosis    osteopenia    Current Outpatient Medications  Medication Sig Dispense Refill  . Ascorbic Acid (VITAMIN C PO) Take by mouth.    . Cholecalciferol (VITAMIN D PO) Take by mouth.    . Multiple Minerals (CALCIUM-MAGNESIUM-ZINC) TABS Take 1 tablet by mouth at bedtime.    . Multiple Vitamin (MULTIVITAMIN) tablet Take 1 tablet by mouth daily.    Marland Kitchen omeprazole (PRILOSEC) 40 MG capsule TAKE ONE (1) CAPSULE BY MOUTH EACH DAY 20 MINUTES BEFORE BREAKFAST.  12  . Probiotic Product (PROBIOTIC DAILY PO) Take by mouth.    . traMADol (ULTRAM) 50 MG tablet Take 1 tablet (50 mg total) by mouth every 6 (six) hours as needed. 20 tablet 0  . Travoprost, BAK Free, (TRAVATAMN) 0.004 % SOLN ophthalmic solution Place 1 drop into both eyes at bedtime.      Marland Kitchen VITAMIN E PO Take by mouth.     No current facility-administered medications for this visit.     Allergies  Allergen Reactions  . Amoxicillin Diarrhea and Nausea And Vomiting    GI intolerance.   . Metoclopramide Hcl Other (See Comments)    Stroke-like symptoms (dose too high?)    No family history on file.  Social History   Socioeconomic History  . Marital status: Married    Spouse name: Not on file  . Number of children: 5  . Years of education: Not on file  . Highest education level: Not on file  Occupational History  . Occupation: Retired    Fish farm manager: RETIRED  Social Needs  . Financial resource strain: Not on file  . Food insecurity:    Worry: Not on file    Inability: Not on file  . Transportation needs:    Medical: Not on file    Non-medical: Not on file  Tobacco Use  . Smoking status: Former Research scientist (life sciences)  . Smokeless tobacco: Never Used  Substance and Sexual Activity  . Alcohol use: No  . Drug use: Not on file  . Sexual activity: Not on file  Lifestyle  . Physical activity:    Days per week: Not on file    Minutes per session: Not on file  . Stress: Not on file  Relationships  . Social connections:    Talks on phone: Not on file    Gets together: Not on file    Attends religious service: Not on file    Active member of club or organization: Not on file    Attends meetings of  clubs or organizations: Not on file    Relationship status: Not on file  . Intimate partner violence:    Fear of current or ex partner: Not on file    Emotionally abused: Not on file    Physically abused: Not on file    Forced sexual activity: Not on file  Other Topics Concern  . Not on file  Social History Narrative   Desires CPR   Would not want life support prolonged if futile    ROS:  Constitutional: Denies fever, malaise, fatigue, headache or abrupt weight changes.  HEENT: Denies eye pain, eye redness, ear pain, ringing in the ears, wax buildup, runny nose, nasal congestion, bloody nose, or sore throat. Respiratory: Denies difficulty breathing, shortness of breath, cough or sputum production.   Cardiovascular: Denies chest pain, chest tightness,  palpitations or swelling in the hands or feet.  Gastrointestinal: Denies abdominal pain, bloating, constipation, diarrhea or blood in the stool.  GU: Denies frequency, urgency, pain with urination, blood in urine, odor or discharge. Musculoskeletal: Denies decrease in range of motion, difficulty with gait, muscle pain or joint pain and swelling.  Skin: Denies redness, rashes, lesions or ulcercations.  Neurological: Denies dizziness, difficulty with memory, difficulty with speech or problems with balance and coordination.  Psych: Denies anxiety, depression, SI/HI.  No other specific complaints in a complete review of systems (except as listed in HPI above).  PE:  BP 116/74   Pulse 67   Temp 97.8 F (36.6 C) (Oral)   Ht 4' 10.5" (1.486 m)   Wt 175 lb (79.4 kg)   SpO2 98%   BMI 35.95 kg/m   Wt Readings from Last 3 Encounters:  01/12/18 171 lb 8 oz (77.8 kg)  11/17/17 172 lb (78 kg)  05/19/17 172 lb (78 kg)    General: Appears her stated age, well developed, well nourished in NAD. Cardiovascular: Normal rate and rhythm. S1,S2 noted.  No murmur, rubs or gallops noted. Pulmonary/Chest: Normal effort and positive vesicular breath sounds. No respiratory distress. No wheezes, rales or ronchi noted.  Abdomen: Soft and nontender. Normal bowel sounds. Musculoskeletal: No difficulty with gait.  Neurological: Alert and oriented. Cranial nerves II-XII grossly intact. Coordination normal.  Psychiatric: Mood and affect normal. Behavior is normal. Judgment and thought content normal.     BMET    Component Value Date/Time   NA 136 11/17/2017 0945   NA 138 08/05/2014 1652   K 4.1 11/17/2017 0945   K 3.7 08/05/2014 1652   CL 104 11/17/2017 0945   CL 104 08/05/2014 1652   CO2 23 11/17/2017 0945   CO2 29 08/05/2014 1652   GLUCOSE 123 (H) 11/17/2017 0945   GLUCOSE 89 08/05/2014 1652   BUN 12 11/17/2017 0945   BUN 19 (H) 08/05/2014 1652   CREATININE 0.68 11/17/2017 0945   CREATININE 0.72  08/05/2014 1652   CREATININE 0.66 08/01/2011 1536   CALCIUM 9.1 11/17/2017 0945   CALCIUM 9.1 08/05/2014 1652   GFRNONAA >60 11/17/2017 0945   GFRNONAA >60 08/05/2014 1652   GFRAA >60 11/17/2017 0945   GFRAA >60 08/05/2014 1652    Lipid Panel     Component Value Date/Time   CHOL 191 05/08/2017 0857   TRIG 76.0 05/08/2017 0857   HDL 41.80 05/08/2017 0857   CHOLHDL 5 05/08/2017 0857   VLDL 15.2 05/08/2017 0857   LDLCALC 134 (H) 05/08/2017 0857    CBC    Component Value Date/Time   WBC 4.5 11/17/2017 0945  RBC 4.29 11/17/2017 0945   HGB 12.9 11/17/2017 0945   HGB 12.3 08/05/2014 1652   HGB 13.3 10/21/2010 0856   HGB 12.6 12/23/2007 0811   HCT 38.9 11/17/2017 0945   HCT 38.1 08/05/2014 1652   HCT 40.0 10/21/2010 0856   HCT 36.0 12/23/2007 0811   PLT 207 11/17/2017 0945   PLT 212 08/05/2014 1652   PLT 241 10/21/2010 0856   PLT 208 12/23/2007 0811   MCV 90.7 11/17/2017 0945   MCV 95 08/05/2014 1652   MCV 93 10/21/2010 0856   MCV 90.5 12/23/2007 0811   MCH 30.0 11/17/2017 0945   MCHC 33.1 11/17/2017 0945   RDW 13.9 11/17/2017 0945   RDW 13.7 08/05/2014 1652   RDW 11.9 10/21/2010 0856   RDW 13.4 12/23/2007 0811   LYMPHSABS 1.9 05/07/2016 0818   LYMPHSABS 2.1 08/05/2014 1652   LYMPHSABS 1.1 10/21/2010 0856   LYMPHSABS 1.1 12/23/2007 0811   MONOABS 0.6 05/07/2016 0818   MONOABS 0.5 08/05/2014 1652   MONOABS 0.4 12/23/2007 0811   EOSABS 0.0 05/07/2016 0818   EOSABS 0.1 08/05/2014 1652   EOSABS 0.1 10/21/2010 0856   BASOSABS 0.0 05/07/2016 0818   BASOSABS 0.1 08/05/2014 1652   BASOSABS 0.0 10/21/2010 0856   BASOSABS 0.0 12/23/2007 0811    Hgb A1C No results found for: HGBA1C   Assessment and Plan:

## 2018-02-05 DIAGNOSIS — M810 Age-related osteoporosis without current pathological fracture: Secondary | ICD-10-CM

## 2018-02-05 DIAGNOSIS — M1990 Unspecified osteoarthritis, unspecified site: Secondary | ICD-10-CM | POA: Insufficient documentation

## 2018-02-05 HISTORY — DX: Unspecified osteoarthritis, unspecified site: M19.90

## 2018-02-05 HISTORY — DX: Age-related osteoporosis without current pathological fracture: M81.0

## 2018-02-05 NOTE — Patient Instructions (Signed)

## 2018-02-05 NOTE — Assessment & Plan Note (Signed)
In remission.

## 2018-02-05 NOTE — Assessment & Plan Note (Signed)
Controlled on Omeprazole Will monitor

## 2018-02-05 NOTE — Assessment & Plan Note (Signed)
She will continue to follow with orthopedics. 

## 2018-02-05 NOTE — Assessment & Plan Note (Signed)
Encouraged her to continue weight bearing exercise and Calcium/Vit D

## 2018-03-17 ENCOUNTER — Encounter: Payer: Self-pay | Admitting: Podiatry

## 2018-03-17 ENCOUNTER — Ambulatory Visit: Payer: Medicare Other | Admitting: Podiatry

## 2018-03-17 DIAGNOSIS — G629 Polyneuropathy, unspecified: Secondary | ICD-10-CM | POA: Diagnosis not present

## 2018-03-17 DIAGNOSIS — M779 Enthesopathy, unspecified: Secondary | ICD-10-CM

## 2018-03-17 NOTE — Progress Notes (Signed)
Subjective:   Patient ID: Molly Chandler, female   DOB: 83 y.o.   MRN: [id number]   HPI Patient presents stating that she still has a lot of burning in her feet and that she is tried numerous shoe gear modifications and orthotics without current relief   Review of Systems  All other systems reviewed and are negative.       Objective:  Physical Exam  Constitutional: She appears well-developed and well-nourished.  Cardiovascular: Intact distal pulses.  Pulmonary/Chest: Effort normal.  Musculoskeletal: Normal range of motion.  Neurological: She is alert.  Skin: Skin is warm.  Nursing note and vitals reviewed.   Vascular status was intact with patient found to have significant diminishment of sharp dull vibratory bilateral patient who has moderate balance issues and has significant complaints of burning in both feet in a nondescript manner with negative Tinel's sign or indications of compression     Assessment:  Probability that were dealing with some form of neuropathy here of the profound nature that is not responded to medication     Plan:  Reviewed condition at this point I have recommended long-term physical therapy along with probable neurogenic's treatment.  I am referring to physical therapy for modalities that I am hopeful will be of benefit to this patient  X-rays were negative that this appears to be an arthritic process or that there is any type of stress fracture or other bone pathology

## 2018-03-22 NOTE — Addendum Note (Signed)
Addended by: Harriett Sine D on: 03/22/2018 03:30 PM   Modules accepted: Orders

## 2018-03-23 ENCOUNTER — Other Ambulatory Visit: Payer: Self-pay | Admitting: Internal Medicine

## 2018-03-23 DIAGNOSIS — Z1231 Encounter for screening mammogram for malignant neoplasm of breast: Secondary | ICD-10-CM

## 2018-05-03 ENCOUNTER — Ambulatory Visit: Payer: Self-pay

## 2018-05-17 ENCOUNTER — Ambulatory Visit: Payer: Self-pay | Admitting: Internal Medicine

## 2018-05-25 ENCOUNTER — Ambulatory Visit: Payer: Self-pay

## 2018-05-27 ENCOUNTER — Encounter: Payer: Self-pay | Admitting: Family Medicine

## 2018-05-28 ENCOUNTER — Ambulatory Visit: Payer: Self-pay | Admitting: Family Medicine

## 2018-05-31 ENCOUNTER — Encounter: Payer: Self-pay | Admitting: Internal Medicine

## 2018-05-31 ENCOUNTER — Ambulatory Visit (INDEPENDENT_AMBULATORY_CARE_PROVIDER_SITE_OTHER): Payer: Medicare Other | Admitting: Internal Medicine

## 2018-05-31 ENCOUNTER — Other Ambulatory Visit: Payer: Self-pay | Admitting: Internal Medicine

## 2018-05-31 VITALS — BP 116/68 | HR 67 | Temp 98.4°F | Ht 59.5 in | Wt 177.5 lb

## 2018-05-31 DIAGNOSIS — Z23 Encounter for immunization: Secondary | ICD-10-CM

## 2018-05-31 DIAGNOSIS — Z0000 Encounter for general adult medical examination without abnormal findings: Secondary | ICD-10-CM | POA: Diagnosis not present

## 2018-05-31 DIAGNOSIS — E2839 Other primary ovarian failure: Secondary | ICD-10-CM

## 2018-05-31 DIAGNOSIS — Z780 Asymptomatic menopausal state: Secondary | ICD-10-CM

## 2018-05-31 LAB — CBC
HCT: 38.8 % (ref 36.0–46.0)
Hemoglobin: 13 g/dL (ref 12.0–15.0)
MCHC: 33.5 g/dL (ref 30.0–36.0)
MCV: 90.9 fl (ref 78.0–100.0)
Platelets: 182 10*3/uL (ref 150.0–400.0)
RBC: 4.26 Mil/uL (ref 3.87–5.11)
RDW: 13.6 % (ref 11.5–15.5)
WBC: 6.1 10*3/uL (ref 4.0–10.5)

## 2018-05-31 LAB — COMPREHENSIVE METABOLIC PANEL
ALT: 29 U/L (ref 0–35)
AST: 31 U/L (ref 0–37)
Albumin: 4.3 g/dL (ref 3.5–5.2)
Alkaline Phosphatase: 70 U/L (ref 39–117)
BUN: 18 mg/dL (ref 6–23)
CO2: 30 mEq/L (ref 19–32)
Calcium: 9.8 mg/dL (ref 8.4–10.5)
Chloride: 106 mEq/L (ref 96–112)
Creatinine, Ser: 0.69 mg/dL (ref 0.40–1.20)
GFR: 86.72 mL/min (ref >60.00)
Glucose, Bld: 94 mg/dL (ref 70–99)
Potassium: 4.2 mEq/L (ref 3.5–5.1)
Sodium: 141 mEq/L (ref 135–145)
Total Bilirubin: 0.5 mg/dL (ref 0.2–1.2)
Total Protein: 7.3 g/dL (ref 6.0–8.3)

## 2018-05-31 LAB — LIPID PANEL
Cholesterol: 213 mg/dL — ABNORMAL HIGH (ref 0–200)
HDL: 38.5 mg/dL — ABNORMAL LOW (ref >39.00)
LDL Cholesterol: 159 mg/dL — ABNORMAL HIGH (ref 0–99)
NonHDL: 174.87
Total CHOL/HDL Ratio: 6
Triglycerides: 77 mg/dL (ref 0.0–149.0)
VLDL: 15.4 mg/dL (ref 0.0–40.0)

## 2018-05-31 LAB — VITAMIN D 25 HYDROXY (VIT D DEFICIENCY, FRACTURES): VITD: 32.27 ng/mL (ref 30.00–100.00)

## 2018-05-31 NOTE — Patient Instructions (Signed)
Health Maintenance for Postmenopausal Women Menopause is a normal process in which your reproductive ability comes to an end. This process happens gradually over a span of months to years, usually between the ages of 81 and 81. Menopause is complete when you have missed 12 consecutive menstrual periods. It is important to talk with your health care provider about some of the most common conditions that affect postmenopausal women, such as heart disease, cancer, and bone loss (osteoporosis). Adopting a healthy lifestyle and getting preventive care can help to promote your health and wellness. Those actions can also lower your chances of developing some of these common conditions. What should I know about menopause? During menopause, you may experience a number of symptoms, such as:  Moderate-to-severe hot flashes.  Night sweats.  Decrease in sex drive.  Mood swings.  Headaches.  Tiredness.  Irritability.  Memory problems.  Insomnia.  Choosing to treat or not to treat menopausal changes is an individual decision that you make with your health care provider. What should I know about hormone replacement therapy and supplements? Hormone therapy products are effective for treating symptoms that are associated with menopause, such as hot flashes and night sweats. Hormone replacement carries certain risks, especially as you become older. If you are thinking about using estrogen or estrogen with progestin treatments, discuss the benefits and risks with your health care provider. What should I know about heart disease and stroke? Heart disease, heart attack, and stroke become more likely as you age. This may be due, in part, to the hormonal changes that your body experiences during menopause. These can affect how your body processes dietary fats, triglycerides, and cholesterol. Heart attack and stroke are both medical emergencies. There are many things that you can do to help prevent heart disease  and stroke:  Have your blood pressure checked at least every 1-2 years. High blood pressure causes heart disease and increases the risk of stroke.  If you are 81-81 years old, ask your health care provider if you should take aspirin to prevent a heart attack or a stroke.  Do not use any tobacco products, including cigarettes, chewing tobacco, or electronic cigarettes. If you need help quitting, ask your health care provider.  It is important to eat a healthy diet and maintain a healthy weight. ? Be sure to include plenty of vegetables, fruits, low-fat dairy products, and lean protein. ? Avoid eating foods that are high in solid fats, added sugars, or salt (sodium).  Get regular exercise. This is one of the most important things that you can do for your health. ? Try to exercise for at least 150 minutes each week. The type of exercise that you do should increase your heart rate and make you sweat. This is known as moderate-intensity exercise. ? Try to do strengthening exercises at least twice each week. Do these in addition to the moderate-intensity exercise.  Know your numbers.Ask your health care provider to check your cholesterol and your blood glucose. Continue to have your blood tested as directed by your health care provider.  What should I know about cancer screening? There are several types of cancer. Take the following steps to reduce your risk and to catch any cancer development as early as possible. Breast Cancer  Practice breast self-awareness. ? This means understanding how your breasts normally appear and feel. ? It also means doing regular breast self-exams. Let your health care provider know about any changes, no matter how small.  If you are 40  or older, have a clinician do a breast exam (clinical breast exam or CBE) every year. Depending on your age, family history, and medical history, it may be recommended that you also have a yearly breast X-ray (mammogram).  If you  have a family history of breast cancer, talk with your health care provider about genetic screening.  If you are at high risk for breast cancer, talk with your health care provider about having an MRI and a mammogram every year.  Breast cancer (BRCA) gene test is recommended for women who have family members with BRCA-related cancers. Results of the assessment will determine the need for genetic counseling and BRCA1 and for BRCA2 testing. BRCA-related cancers include these types: ? Breast. This occurs in males or females. ? Ovarian. ? Tubal. This may also be called fallopian tube cancer. ? Cancer of the abdominal or pelvic lining (peritoneal cancer). ? Prostate. ? Pancreatic.  Cervical, Uterine, and Ovarian Cancer Your health care provider may recommend that you be screened regularly for cancer of the pelvic organs. These include your ovaries, uterus, and vagina. This screening involves a pelvic exam, which includes checking for microscopic changes to the surface of your cervix (Pap test).  For women ages 81-81, health care providers may recommend a pelvic exam and a Pap test every three years. For women ages 81-81, they may recommend the Pap test and pelvic exam, combined with testing for human papilloma virus (HPV), every five years. Some types of HPV increase your risk of cervical cancer. Testing for HPV may also be done on women of any age who have unclear Pap test results.  Other health care providers may not recommend any screening for nonpregnant women who are considered low risk for pelvic cancer and have no symptoms. Ask your health care provider if a screening pelvic exam is right for you.  If you have had past treatment for cervical cancer or a condition that could lead to cancer, you need Pap tests and screening for cancer for at least 20 years after your treatment. If Pap tests have been discontinued for you, your risk factors (such as having a new sexual partner) need to be  reassessed to determine if you should start having screenings again. Some women have medical problems that increase the chance of getting cervical cancer. In these cases, your health care provider may recommend that you have screening and Pap tests more often.  If you have a family history of uterine cancer or ovarian cancer, talk with your health care provider about genetic screening.  If you have vaginal bleeding after reaching menopause, tell your health care provider.  There are currently no reliable tests available to screen for ovarian cancer.  Lung Cancer Lung cancer screening is recommended for adults 69-62 years old who are at high risk for lung cancer because of a history of smoking. A yearly low-dose CT scan of the lungs is recommended if you:  Currently smoke.  Have a history of at least 30 pack-years of smoking and you currently smoke or have quit within the past 15 years. A pack-year is smoking an average of one pack of cigarettes per day for one year.  Yearly screening should:  Continue until it has been 15 years since you quit.  Stop if you develop a health problem that would prevent you from having lung cancer treatment.  Colorectal Cancer  This type of cancer can be detected and can often be prevented.  Routine colorectal cancer screening usually begins at  age 42 and continues through age 45.  If you have risk factors for colon cancer, your health care provider may recommend that you be screened at an earlier age.  If you have a family history of colorectal cancer, talk with your health care provider about genetic screening.  Your health care provider may also recommend using home test kits to check for hidden blood in your stool.  A small camera at the end of a tube can be used to examine your colon directly (sigmoidoscopy or colonoscopy). This is done to check for the earliest forms of colorectal cancer.  Direct examination of the colon should be repeated every  5-10 years until age 71. However, if early forms of precancerous polyps or small growths are found or if you have a family history or genetic risk for colorectal cancer, you may need to be screened more often.  Skin Cancer  Check your skin from head to toe regularly.  Monitor any moles. Be sure to tell your health care provider: ? About any new moles or changes in moles, especially if there is a change in a mole's shape or color. ? If you have a mole that is larger than the size of a pencil eraser.  If any of your family members has a history of skin cancer, especially at a young age, talk with your health care provider about genetic screening.  Always use sunscreen. Apply sunscreen liberally and repeatedly throughout the day.  Whenever you are outside, protect yourself by wearing long sleeves, pants, a wide-brimmed hat, and sunglasses.  What should I know about osteoporosis? Osteoporosis is a condition in which bone destruction happens more quickly than new bone creation. After menopause, you may be at an increased risk for osteoporosis. To help prevent osteoporosis or the bone fractures that can happen because of osteoporosis, the following is recommended:  If you are 46-71 years old, get at least 1,000 mg of calcium and at least 600 mg of vitamin D per day.  If you are older than age 55 but younger than age 65, get at least 1,200 mg of calcium and at least 600 mg of vitamin D per day.  If you are older than age 54, get at least 1,200 mg of calcium and at least 800 mg of vitamin D per day.  Smoking and excessive alcohol intake increase the risk of osteoporosis. Eat foods that are rich in calcium and vitamin D, and do weight-bearing exercises several times each week as directed by your health care provider. What should I know about how menopause affects my mental health? Depression may occur at any age, but it is more common as you become older. Common symptoms of depression  include:  Low or sad mood.  Changes in sleep patterns.  Changes in appetite or eating patterns.  Feeling an overall lack of motivation or enjoyment of activities that you previously enjoyed.  Frequent crying spells.  Talk with your health care provider if you think that you are experiencing depression. What should I know about immunizations? It is important that you get and maintain your immunizations. These include:  Tetanus, diphtheria, and pertussis (Tdap) booster vaccine.  Influenza every year before the flu season begins.  Pneumonia vaccine.  Shingles vaccine.  Your health care provider may also recommend other immunizations. This information is not intended to replace advice given to you by your health care provider. Make sure you discuss any questions you have with your health care provider. Document Released: 10/17/2005  Document Revised: 03/14/2016 Document Reviewed: 05/29/2015 Elsevier Interactive Patient Education  2018 [employer]  

## 2018-05-31 NOTE — Progress Notes (Signed)
HPI:  Pt presents to the clinic today for her Medicare Wellness Exam.  Past Medical History:  Diagnosis Date  . Cataract   . Lymphoma ([place])    rectal cancer  . Osteoporosis    osteopenia    Current Outpatient Medications  Medication Sig Dispense Refill  . Ascorbic Acid (VITAMIN C PO) Take by mouth.    . Cholecalciferol (VITAMIN D PO) Take by mouth.    . Multiple Minerals (CALCIUM-MAGNESIUM-ZINC) TABS Take 1 tablet by mouth at bedtime.    . Multiple Vitamin (MULTIVITAMIN) tablet Take 1 tablet by mouth daily.    Marland Kitchen omeprazole (PRILOSEC) 40 MG capsule TAKE ONE (1) CAPSULE BY MOUTH EACH DAY 20 MINUTES BEFORE BREAKFAST.  12  . Probiotic Product (PROBIOTIC DAILY PO) Take by mouth.    . Travoprost, BAK Free, (TRAVATAMN) 0.004 % SOLN ophthalmic solution Place 1 drop into both eyes at bedtime.      Marland Kitchen VITAMIN E PO Take by mouth.     No current facility-administered medications for this visit.     Allergies  Allergen Reactions  . Amoxicillin Diarrhea and Nausea And Vomiting    GI intolerance.   . Metoclopramide Hcl Other (See Comments)    Stroke-like symptoms (dose too high?)    No family history on file.  Social History   Socioeconomic History  . Marital status: Married    Spouse name: Not on file  . Number of children: 5  . Years of education: Not on file  . Highest education level: Not on file  Occupational History  . Occupation: Retired    Fish farm manager: RETIRED  Social Needs  . Financial resource strain: Not on file  . Food insecurity:    Worry: Not on file    Inability: Not on file  . Transportation needs:    Medical: Not on file    Non-medical: Not on file  Tobacco Use  . Smoking status: Former Research scientist (life sciences)  . Smokeless tobacco: Never Used  Substance and Sexual Activity  . Alcohol use: No  . Drug use: Not on file  . Sexual activity: Not on file  Lifestyle  . Physical activity:    Days per week: Not on file    Minutes per session: Not on file  . Stress: Not on file   Relationships  . Social connections:    Talks on phone: Not on file    Gets together: Not on file    Attends religious service: Not on file    Active member of club or organization: Not on file    Attends meetings of clubs or organizations: Not on file    Relationship status: Not on file  . Intimate partner violence:    Fear of current or ex partner: Not on file    Emotionally abused: Not on file    Physically abused: Not on file    Forced sexual activity: Not on file  Other Topics Concern  . Not on file  Social History Narrative   Desires CPR   Would not want life support prolonged if futile    Hospitiliaztions: None  Health Maintenance:    Flu: 06/2017  Tetanus: 09/2011  Pneumovax: 04/2017  Prevnar: 04/2015  Zostavax: 04/2014  Shingrix: never  Mammogram: 04/2017, scheduled 06/03/18  Pap Smear: no longer screening  Bone Density: 04/2012  Colon Screening: 12/2017  Eye Doctor: every 4 months  Dental Exam: as needed   Providers:   PCP: Webb Silversmith, NP-C  Podiatry: Dr. Paulla Dolly  I have personally reviewed and have noted:  1. The patient's medical and social history 2. Their use of alcohol, tobacco or illicit drugs 3. Their current medications and supplements 4. The patient's functional ability including ADL's, fall risks, home safety risks and hearing or visual impairment. 5. Diet and physical activities 6. Evidence for depression or mood disorder  Subjective:   Review of Systems:   Constitutional: Denies fever, malaise, fatigue, headache or abrupt weight changes.  HEENT: Denies eye pain, eye redness, ear pain, ringing in the ears, wax buildup, runny nose, nasal congestion, bloody nose, or sore throat. Respiratory: Denies difficulty breathing, shortness of breath, cough or sputum production.   Cardiovascular: Denies chest pain, chest tightness, palpitations or swelling in the hands or feet.  Gastrointestinal: Denies abdominal pain, bloating, constipation, diarrhea or  blood in the stool.  GU: Denies urgency, frequency, pain with urination, burning sensation, blood in urine, odor or discharge. Musculoskeletal: Denies decrease in range of motion, difficulty with gait, muscle pain or joint pain and swelling.  Skin: Denies redness, rashes, lesions or ulcercations.  Neurological: Denies dizziness, difficulty with memory, difficulty with speech or problems with balance and coordination.  Psych: Denies anxiety, depression, SI/HI.  No other specific complaints in a complete review of systems (except as listed in HPI above).  Objective:  PE:   BP 116/68   Pulse 67   Temp 98.4 F (36.9 C) (Oral)   Ht 4' 11.5" (1.511 m)   Wt 177 lb 8 oz (80.5 kg)   SpO2 95%   BMI 35.25 kg/m   Wt Readings from Last 3 Encounters:  02/04/18 175 lb (79.4 kg)  01/12/18 171 lb 8 oz (77.8 kg)  11/17/17 172 lb (78 kg)    General: Appears her stated age, obese in NAD. Skin: Warm, dry and intact. Vascular changes noted of BLE. Mycotic toenails bilaterally. HEENT: Head: normal shape and size; Eyes: sclera white, no icterus, conjunctiva pink, PERRLA and EOMs intact; Ears: Tm's gray and intact, normal light reflex; Throat/Mouth: Teeth present, mucosa pink and moist, no exudate, lesions or ulcerations noted.  Neck: Neck supple, trachea midline. No masses, lumps or thyromegaly present.  Cardiovascular: Normal rate and rhythm. S1,S2 noted.  No murmur, rubs or gallops noted. No JVD or BLE edema. No carotid bruits noted. Pulmonary/Chest: Normal effort and positive vesicular breath sounds. No respiratory distress. No wheezes, rales or ronchi noted.  Abdomen: Soft and nontender. Normal bowel sounds. No distention or masses noted. Liver, spleen and kidneys non palpable. Musculoskeletal: Strength 5/5 BUE/BLE. No difficulty with gait. Neurological: Alert and oriented. Cranial nerves II-XII grossly intact. Coordination normal.  Psychiatric: Mood and affect normal. Behavior is normal. Judgment  and thought content normal.     BMET    Component Value Date/Time   NA 136 11/17/2017 0945   NA 138 08/05/2014 1652   K 4.1 11/17/2017 0945   K 3.7 08/05/2014 1652   CL 104 11/17/2017 0945   CL 104 08/05/2014 1652   CO2 23 11/17/2017 0945   CO2 29 08/05/2014 1652   GLUCOSE 123 (H) 11/17/2017 0945   GLUCOSE 89 08/05/2014 1652   BUN 12 11/17/2017 0945   BUN 19 (H) 08/05/2014 1652   CREATININE 0.68 11/17/2017 0945   CREATININE 0.72 08/05/2014 1652   CREATININE 0.66 08/01/2011 1536   CALCIUM 9.1 11/17/2017 0945   CALCIUM 9.1 08/05/2014 1652   GFRNONAA >60 11/17/2017 0945   GFRNONAA >60 08/05/2014 1652   GFRAA >60 11/17/2017 0945   GFRAA >  60 08/05/2014 1652    Lipid Panel     Component Value Date/Time   CHOL 191 05/08/2017 0857   TRIG 76.0 05/08/2017 0857   HDL 41.80 05/08/2017 0857   CHOLHDL 5 05/08/2017 0857   VLDL 15.2 05/08/2017 0857   LDLCALC 134 (H) 05/08/2017 0857    CBC    Component Value Date/Time   WBC 4.5 11/17/2017 0945   RBC 4.29 11/17/2017 0945   HGB 12.9 11/17/2017 0945   HGB 12.3 08/05/2014 1652   HGB 13.3 10/21/2010 0856   HGB 12.6 12/23/2007 0811   HCT 38.9 11/17/2017 0945   HCT 38.1 08/05/2014 1652   HCT 40.0 10/21/2010 0856   HCT 36.0 12/23/2007 0811   PLT 207 11/17/2017 0945   PLT 212 08/05/2014 1652   PLT 241 10/21/2010 0856   PLT 208 12/23/2007 0811   MCV 90.7 11/17/2017 0945   MCV 95 08/05/2014 1652   MCV 93 10/21/2010 0856   MCV 90.5 12/23/2007 0811   MCH 30.0 11/17/2017 0945   MCHC 33.1 11/17/2017 0945   RDW 13.9 11/17/2017 0945   RDW 13.7 08/05/2014 1652   RDW 11.9 10/21/2010 0856   RDW 13.4 12/23/2007 0811   LYMPHSABS 1.9 05/07/2016 0818   LYMPHSABS 2.1 08/05/2014 1652   LYMPHSABS 1.1 10/21/2010 0856   LYMPHSABS 1.1 12/23/2007 0811   MONOABS 0.6 05/07/2016 0818   MONOABS 0.5 08/05/2014 1652   MONOABS 0.4 12/23/2007 0811   EOSABS 0.0 05/07/2016 0818   EOSABS 0.1 08/05/2014 1652   EOSABS 0.1 10/21/2010 0856   BASOSABS  0.0 05/07/2016 0818   BASOSABS 0.1 08/05/2014 1652   BASOSABS 0.0 10/21/2010 0856   BASOSABS 0.0 12/23/2007 0811    Hgb A1C No results found for: HGBA1C    Assessment and Plan:   Medicare Annual Wellness Visit:  Diet:  She does eat meat. She consumes fruits and veggies. She does eat some fried foods. She drinks mostly lemonade, milk. Physical activity: None Depression/mood screen: Negative Hearing: Intact to whispered voice Visual acuity: Grossly normal, performs annual eye exam  ADLs: Capable Fall risk: None Home safety: Good Cognitive evaluation: Intact to orientation, naming, recall and repetition EOL planning: Adv directives, full code/ I agree  Preventative Medicine: Flu shot today. Tetanus, pneumovax, prevnar and zostovax UTD. She is on a wait list for shingrix. Mammogram scheduled. She no longer needs pap smears. Bone density ordered, she will call to schedule. Colon screening UTD. Encouraged her to consume a balanced diet and exercise regimen. Advised her to see an eye doctor and dentist annually. Will check CBC, CMET, Lipid and Vit D.   Next appointment: 1 year, Medicare Wellness Exam   Webb Silversmith, NP

## 2018-06-03 ENCOUNTER — Ambulatory Visit: Payer: Self-pay

## 2018-07-07 ENCOUNTER — Ambulatory Visit
Admission: RE | Admit: 2018-07-07 | Discharge: 2018-07-07 | Disposition: A | Discharge status: Home / Self Care | Payer: Medicare Other | Source: Ambulatory Visit | Attending: Internal Medicine | Admitting: Internal Medicine

## 2018-07-07 DIAGNOSIS — E2839 Other primary ovarian failure: Secondary | ICD-10-CM

## 2018-07-07 DIAGNOSIS — Z1231 Encounter for screening mammogram for malignant neoplasm of breast: Secondary | ICD-10-CM

## 2018-07-16 ENCOUNTER — Telehealth: Payer: Self-pay

## 2018-07-16 NOTE — Telephone Encounter (Signed)
Letter was mailed last week  Bone density shows osteopenia, mild bone loss. Should be taking Calcium and vit D daily along with 30 minutes of weight bearing exercise (walking) to prevent further bone loss.

## 2018-07-16 NOTE — Telephone Encounter (Signed)
Pt left v/m; has not received report on bone density study and pt request cb.

## 2018-09-08 DIAGNOSIS — Z951 Presence of aortocoronary bypass graft: Secondary | ICD-10-CM

## 2018-09-08 HISTORY — DX: Presence of aortocoronary bypass graft: Z95.1

## 2018-10-01 ENCOUNTER — Encounter: Payer: Self-pay | Admitting: Internal Medicine

## 2018-10-05 ENCOUNTER — Encounter (HOSPITAL_COMMUNITY): Payer: Self-pay | Admitting: Emergency Medicine

## 2018-10-05 ENCOUNTER — Inpatient Hospital Stay (HOSPITAL_COMMUNITY)
Admission: EM | Admit: 2018-10-05 | Discharge: 2018-10-14 | DRG: 233 | Disposition: A | Discharge status: Home Health | Payer: Medicare Other | Attending: Surgery | Admitting: Surgery

## 2018-10-05 ENCOUNTER — Inpatient Hospital Stay (HOSPITAL_COMMUNITY): Payer: Medicare Other

## 2018-10-05 ENCOUNTER — Encounter (HOSPITAL_COMMUNITY)
Admission: EM | Disposition: A | Discharge status: Home Health | Payer: Self-pay | Source: Home / Self Care | Attending: Surgery

## 2018-10-05 ENCOUNTER — Inpatient Hospital Stay (HOSPITAL_COMMUNITY): Payer: Medicare Other | Admitting: Anesthesiology

## 2018-10-05 ENCOUNTER — Inpatient Hospital Stay (HOSPITAL_COMMUNITY)
Admission: EM | Disposition: A | Discharge status: Home Health | Payer: Self-pay | Source: Home / Self Care | Attending: Surgery

## 2018-10-05 DIAGNOSIS — Z8572 Personal history of non-Hodgkin lymphomas: Secondary | ICD-10-CM | POA: Diagnosis not present

## 2018-10-05 DIAGNOSIS — I4891 Unspecified atrial fibrillation: Secondary | ICD-10-CM | POA: Diagnosis not present

## 2018-10-05 DIAGNOSIS — I5041 Acute combined systolic (congestive) and diastolic (congestive) heart failure: Secondary | ICD-10-CM | POA: Diagnosis present

## 2018-10-05 DIAGNOSIS — I2101 ST elevation (STEMI) myocardial infarction involving left main coronary artery: Secondary | ICD-10-CM | POA: Diagnosis not present

## 2018-10-05 DIAGNOSIS — I25110 Atherosclerotic heart disease of native coronary artery with unstable angina pectoris: Secondary | ICD-10-CM | POA: Diagnosis not present

## 2018-10-05 DIAGNOSIS — M810 Age-related osteoporosis without current pathological fracture: Secondary | ICD-10-CM | POA: Diagnosis present

## 2018-10-05 DIAGNOSIS — Z85048 Personal history of other malignant neoplasm of rectum, rectosigmoid junction, and anus: Secondary | ICD-10-CM | POA: Diagnosis not present

## 2018-10-05 DIAGNOSIS — I2109 ST elevation (STEMI) myocardial infarction involving other coronary artery of anterior wall: Principal | ICD-10-CM | POA: Diagnosis present

## 2018-10-05 DIAGNOSIS — D62 Acute posthemorrhagic anemia: Secondary | ICD-10-CM | POA: Diagnosis not present

## 2018-10-05 DIAGNOSIS — I509 Heart failure, unspecified: Secondary | ICD-10-CM

## 2018-10-05 DIAGNOSIS — Z9889 Other specified postprocedural states: Secondary | ICD-10-CM

## 2018-10-05 DIAGNOSIS — I2510 Atherosclerotic heart disease of native coronary artery without angina pectoris: Secondary | ICD-10-CM | POA: Diagnosis present

## 2018-10-05 DIAGNOSIS — Z951 Presence of aortocoronary bypass graft: Secondary | ICD-10-CM

## 2018-10-05 DIAGNOSIS — I255 Ischemic cardiomyopathy: Secondary | ICD-10-CM | POA: Diagnosis present

## 2018-10-05 DIAGNOSIS — J9811 Atelectasis: Secondary | ICD-10-CM | POA: Diagnosis not present

## 2018-10-05 DIAGNOSIS — I213 ST elevation (STEMI) myocardial infarction of unspecified site: Secondary | ICD-10-CM

## 2018-10-05 DIAGNOSIS — Z888 Allergy status to other drugs, medicaments and biological substances status: Secondary | ICD-10-CM | POA: Diagnosis not present

## 2018-10-05 DIAGNOSIS — I471 Supraventricular tachycardia: Secondary | ICD-10-CM | POA: Diagnosis not present

## 2018-10-05 DIAGNOSIS — R570 Cardiogenic shock: Secondary | ICD-10-CM | POA: Diagnosis present

## 2018-10-05 DIAGNOSIS — Z09 Encounter for follow-up examination after completed treatment for conditions other than malignant neoplasm: Secondary | ICD-10-CM

## 2018-10-05 DIAGNOSIS — Z79899 Other long term (current) drug therapy: Secondary | ICD-10-CM | POA: Diagnosis not present

## 2018-10-05 DIAGNOSIS — I252 Old myocardial infarction: Secondary | ICD-10-CM | POA: Diagnosis present

## 2018-10-05 DIAGNOSIS — D6959 Other secondary thrombocytopenia: Secondary | ICD-10-CM | POA: Diagnosis not present

## 2018-10-05 HISTORY — DX: Atherosclerotic heart disease of native coronary artery without angina pectoris: I25.10

## 2018-10-05 HISTORY — PX: CORONARY ARTERY BYPASS GRAFT: SHX141

## 2018-10-05 HISTORY — PX: LEFT HEART CATH AND CORONARY ANGIOGRAPHY: CATH118249

## 2018-10-05 HISTORY — PX: CORONARY/GRAFT ACUTE MI REVASCULARIZATION: CATH118305

## 2018-10-05 HISTORY — PX: IABP INSERTION: CATH118242

## 2018-10-05 HISTORY — DX: Old myocardial infarction: I25.2

## 2018-10-05 HISTORY — DX: Presence of aortocoronary bypass graft: Z95.1

## 2018-10-05 LAB — POCT I-STAT 7, (LYTES, BLD GAS, ICA,H+H)
Acid-Base Excess: 1 mmol/L (ref 0.0–2.0)
Acid-base deficit: 6 mmol/L — ABNORMAL HIGH (ref 0.0–2.0)
Acid-base deficit: 6 mmol/L — ABNORMAL HIGH (ref 0.0–2.0)
Acid-base deficit: 2 mmol/L (ref 0.0–2.0)
Acid-base deficit: 3 mmol/L — ABNORMAL HIGH (ref 0.0–2.0)
Acid-base deficit: 7 mmol/L — ABNORMAL HIGH (ref 0.0–2.0)
Acid-base deficit: 3 mmol/L — ABNORMAL HIGH (ref 0.0–2.0)
Bicarbonate: 20.9 mmol/L (ref 20.0–28.0)
Bicarbonate: 19.7 mmol/L — ABNORMAL LOW (ref 20.0–28.0)
Bicarbonate: 23.3 mmol/L (ref 20.0–28.0)
Bicarbonate: 21.7 mmol/L (ref 20.0–28.0)
Bicarbonate: 21.5 mmol/L (ref 20.0–28.0)
Bicarbonate: 23.9 mmol/L (ref 20.0–28.0)
Bicarbonate: 25.9 mmol/L (ref 20.0–28.0)
Calcium, Ion: 1.16 mmol/L (ref 1.15–1.40)
Calcium, Ion: 1.16 mmol/L (ref 1.15–1.40)
Calcium, Ion: 0.86 mmol/L — CL (ref 1.15–1.40)
Calcium, Ion: 1.26 mmol/L (ref 1.15–1.40)
Calcium, Ion: 1.09 mmol/L — ABNORMAL LOW (ref 1.15–1.40)
Calcium, Ion: 0.99 mmol/L — ABNORMAL LOW (ref 1.15–1.40)
Calcium, Ion: 1.04 mmol/L — ABNORMAL LOW (ref 1.15–1.40)
HCT: 37 % (ref 36.0–46.0)
HCT: 36 % (ref 36.0–46.0)
HCT: 17 % — ABNORMAL LOW (ref 36.0–46.0)
HCT: 21 % — ABNORMAL LOW (ref 36.0–46.0)
HCT: 32 % — ABNORMAL LOW (ref 36.0–46.0)
HCT: 31 % — ABNORMAL LOW (ref 36.0–46.0)
HCT: 29 % — ABNORMAL LOW (ref 36.0–46.0)
Hemoglobin: 12.6 g/dL (ref 12.0–15.0)
Hemoglobin: 12.2 g/dL (ref 12.0–15.0)
Hemoglobin: 5.8 g/dL — CL (ref 12.0–15.0)
Hemoglobin: 7.1 g/dL — ABNORMAL LOW (ref 12.0–15.0)
Hemoglobin: 10.9 g/dL — ABNORMAL LOW (ref 12.0–15.0)
Hemoglobin: 10.5 g/dL — ABNORMAL LOW (ref 12.0–15.0)
Hemoglobin: 9.9 g/dL — ABNORMAL LOW (ref 12.0–15.0)
O2 Saturation: 42 %
O2 Saturation: 91 %
O2 Saturation: 100 %
O2 Saturation: 100 %
O2 Saturation: 91 %
O2 Saturation: 92 %
O2 Saturation: 98 %
Patient temperature: 98.1
Patient temperature: 35.7
Patient temperature: 35.7
Potassium: 3.6 mmol/L (ref 3.5–5.1)
Potassium: 3.9 mmol/L (ref 3.5–5.1)
Potassium: 3.9 mmol/L (ref 3.5–5.1)
Potassium: 4.2 mmol/L (ref 3.5–5.1)
Potassium: 3.8 mmol/L (ref 3.5–5.1)
Potassium: 3.7 mmol/L (ref 3.5–5.1)
Potassium: 4 mmol/L (ref 3.5–5.1)
Sodium: 141 mmol/L (ref 135–145)
Sodium: 141 mmol/L (ref 135–145)
Sodium: 144 mmol/L (ref 135–145)
Sodium: 143 mmol/L (ref 135–145)
Sodium: 144 mmol/L (ref 135–145)
Sodium: 146 mmol/L — ABNORMAL HIGH (ref 135–145)
Sodium: 146 mmol/L — ABNORMAL HIGH (ref 135–145)
TCO2: 22 mmol/L (ref 22–32)
TCO2: 21 mmol/L — ABNORMAL LOW (ref 22–32)
TCO2: 25 mmol/L (ref 22–32)
TCO2: 23 mmol/L (ref 22–32)
TCO2: 23 mmol/L (ref 22–32)
TCO2: 25 mmol/L (ref 22–32)
TCO2: 27 mmol/L (ref 22–32)
pCO2 arterial: 45.1 mmHg (ref 32.0–48.0)
pCO2 arterial: 36.2 mmHg (ref 32.0–48.0)
pCO2 arterial: 42 mmHg (ref 32.0–48.0)
pCO2 arterial: 35.6 mmHg (ref 32.0–48.0)
pCO2 arterial: 52.3 mmHg — ABNORMAL HIGH (ref 32.0–48.0)
pCO2 arterial: 45.6 mmHg (ref 32.0–48.0)
pCO2 arterial: 43.5 mmHg (ref 32.0–48.0)
pH, Arterial: 7.274 — ABNORMAL LOW (ref 7.350–7.450)
pH, Arterial: 7.341 — ABNORMAL LOW (ref 7.350–7.450)
pH, Arterial: 7.352 (ref 7.350–7.450)
pH, Arterial: 7.392 (ref 7.350–7.450)
pH, Arterial: 7.215 — ABNORMAL LOW (ref 7.350–7.450)
pH, Arterial: 7.321 — ABNORMAL LOW (ref 7.350–7.450)
pH, Arterial: 7.383 (ref 7.350–7.450)
pO2, Arterial: 27 mmHg — CL (ref 83.0–108.0)
pO2, Arterial: 62 mmHg — ABNORMAL LOW (ref 83.0–108.0)
pO2, Arterial: 405 mmHg — ABNORMAL HIGH (ref 83.0–108.0)
pO2, Arterial: 441 mmHg — ABNORMAL HIGH (ref 83.0–108.0)
pO2, Arterial: 68 mmHg — ABNORMAL LOW (ref 83.0–108.0)
pO2, Arterial: 64 mmHg — ABNORMAL LOW (ref 83.0–108.0)
pO2, Arterial: 117 mmHg — ABNORMAL HIGH (ref 83.0–108.0)

## 2018-10-05 LAB — COOXEMETRY PANEL
Carboxyhemoglobin: 1.1 % (ref 0.5–1.5)
Methemoglobin: 1.4 % (ref 0.0–1.5)
O2 Saturation: 72.2 %
Total hemoglobin: 12.3 g/dL (ref 12.0–16.0)

## 2018-10-05 LAB — CBC
HCT: 36.2 % (ref 36.0–46.0)
HCT: 31.2 % — ABNORMAL LOW (ref 36.0–46.0)
Hemoglobin: 11.8 g/dL — ABNORMAL LOW (ref 12.0–15.0)
Hemoglobin: 10.3 g/dL — ABNORMAL LOW (ref 12.0–15.0)
MCH: 30.4 pg (ref 26.0–34.0)
MCH: 30.1 pg (ref 26.0–34.0)
MCHC: 32.6 g/dL (ref 30.0–36.0)
MCHC: 33 g/dL (ref 30.0–36.0)
MCV: 93.3 fL (ref 80.0–100.0)
MCV: 91.2 fL (ref 80.0–100.0)
Platelets: 102 10*3/uL — ABNORMAL LOW (ref 150–400)
Platelets: 93 10*3/uL — ABNORMAL LOW (ref 150–400)
RBC: 3.88 MIL/uL (ref 3.87–5.11)
RBC: 3.42 MIL/uL — ABNORMAL LOW (ref 3.87–5.11)
RDW: 13.7 % (ref 11.5–15.5)
RDW: 14.2 % (ref 11.5–15.5)
WBC: 3.8 10*3/uL — ABNORMAL LOW (ref 4.0–10.5)
WBC: 4.1 10*3/uL (ref 4.0–10.5)
nRBC: 0 % (ref 0.0–0.2)
nRBC: 0 % (ref 0.0–0.2)

## 2018-10-05 LAB — POCT I-STAT 4, (NA,K, GLUC, HGB,HCT)
Glucose, Bld: 117 mg/dL — ABNORMAL HIGH (ref 70–99)
Glucose, Bld: 108 mg/dL — ABNORMAL HIGH (ref 70–99)
Glucose, Bld: 85 mg/dL (ref 70–99)
Glucose, Bld: 127 mg/dL — ABNORMAL HIGH (ref 70–99)
HCT: 28 % — ABNORMAL LOW (ref 36.0–46.0)
HCT: 27 % — ABNORMAL LOW (ref 36.0–46.0)
HCT: 18 % — ABNORMAL LOW (ref 36.0–46.0)
HCT: 20 % — ABNORMAL LOW (ref 36.0–46.0)
Hemoglobin: 9.5 g/dL — ABNORMAL LOW (ref 12.0–15.0)
Hemoglobin: 9.2 g/dL — ABNORMAL LOW (ref 12.0–15.0)
Hemoglobin: 6.1 g/dL — CL (ref 12.0–15.0)
Hemoglobin: 6.8 g/dL — CL (ref 12.0–15.0)
Potassium: 3.7 mmol/L (ref 3.5–5.1)
Potassium: 4.1 mmol/L (ref 3.5–5.1)
Potassium: 3.9 mmol/L (ref 3.5–5.1)
Potassium: 4.1 mmol/L (ref 3.5–5.1)
Sodium: 143 mmol/L (ref 135–145)
Sodium: 142 mmol/L (ref 135–145)
Sodium: 145 mmol/L (ref 135–145)
Sodium: 143 mmol/L (ref 135–145)

## 2018-10-05 LAB — GLUCOSE, CAPILLARY
Glucose-Capillary: 104 mg/dL — ABNORMAL HIGH (ref 70–99)
Glucose-Capillary: 110 mg/dL — ABNORMAL HIGH (ref 70–99)
Glucose-Capillary: 110 mg/dL — ABNORMAL HIGH (ref 70–99)
Glucose-Capillary: 112 mg/dL — ABNORMAL HIGH (ref 70–99)
Glucose-Capillary: 113 mg/dL — ABNORMAL HIGH (ref 70–99)
Glucose-Capillary: 115 mg/dL — ABNORMAL HIGH (ref 70–99)
Glucose-Capillary: 122 mg/dL — ABNORMAL HIGH (ref 70–99)
Glucose-Capillary: 86 mg/dL (ref 70–99)

## 2018-10-05 LAB — CBC WITH DIFFERENTIAL/PLATELET
Abs Immature Granulocytes: 0 10*3/uL (ref 0.00–0.07)
Basophils Absolute: 0.1 10*3/uL (ref 0.0–0.1)
Basophils Relative: 1 %
Eosinophils Absolute: 0.1 10*3/uL (ref 0.0–0.5)
Eosinophils Relative: 1 %
HCT: 38.9 % (ref 36.0–46.0)
Hemoglobin: 12.1 g/dL (ref 12.0–15.0)
Lymphocytes Relative: 0 %
Lymphs Abs: 0 10*3/uL — ABNORMAL LOW (ref 0.7–4.0)
MCH: 29.5 pg (ref 26.0–34.0)
MCHC: 31.1 g/dL (ref 30.0–36.0)
MCV: 94.9 fL (ref 80.0–100.0)
Monocytes Absolute: 0.2 10*3/uL (ref 0.1–1.0)
Monocytes Relative: 3 %
Neutro Abs: 5.4 10*3/uL (ref 1.7–7.7)
Neutrophils Relative %: 95 %
Platelets: 146 10*3/uL — ABNORMAL LOW (ref 150–400)
RBC: 4.1 MIL/uL (ref 3.87–5.11)
RDW: 13.6 % (ref 11.5–15.5)
WBC: 5.7 10*3/uL (ref 4.0–10.5)
nRBC: 0 % (ref 0.0–0.2)
nRBC: 0 /100 WBC

## 2018-10-05 LAB — LIPID PANEL
Cholesterol: 194 mg/dL (ref 0–200)
Cholesterol: 178 mg/dL (ref 0–200)
HDL: 48 mg/dL (ref >40)
HDL: 47 mg/dL (ref >40)
LDL Cholesterol: 135 mg/dL — ABNORMAL HIGH (ref 0–99)
LDL Cholesterol: 127 mg/dL — ABNORMAL HIGH (ref 0–99)
Total CHOL/HDL Ratio: 4 RATIO
Total CHOL/HDL Ratio: 3.8 RATIO
Triglycerides: 56 mg/dL (ref <150)
Triglycerides: 20 mg/dL (ref <150)
VLDL: 11 mg/dL (ref 0–40)
VLDL: 4 mg/dL (ref 0–40)

## 2018-10-05 LAB — PLATELET COUNT: Platelets: 81 10*3/uL — ABNORMAL LOW (ref 150–400)

## 2018-10-05 LAB — COMPREHENSIVE METABOLIC PANEL
ALT: 13 U/L (ref 0–44)
ALT: 18 U/L (ref 0–44)
AST: 20 U/L (ref 15–41)
AST: 58 U/L — ABNORMAL HIGH (ref 15–41)
Albumin: 3.4 g/dL — ABNORMAL LOW (ref 3.5–5.0)
Albumin: 3.1 g/dL — ABNORMAL LOW (ref 3.5–5.0)
Alkaline Phosphatase: 42 U/L (ref 38–126)
Alkaline Phosphatase: 41 U/L (ref 38–126)
Anion gap: 13 (ref 5–15)
Anion gap: 12 (ref 5–15)
BUN: 25 mg/dL — ABNORMAL HIGH (ref 8–23)
BUN: 24 mg/dL — ABNORMAL HIGH (ref 8–23)
CO2: 19 mmol/L — ABNORMAL LOW (ref 22–32)
CO2: 18 mmol/L — ABNORMAL LOW (ref 22–32)
Calcium: 8.5 mg/dL — ABNORMAL LOW (ref 8.9–10.3)
Calcium: 8 mg/dL — ABNORMAL LOW (ref 8.9–10.3)
Chloride: 107 mmol/L (ref 98–111)
Chloride: 108 mmol/L (ref 98–111)
Creatinine, Ser: 1 mg/dL (ref 0.44–1.00)
Creatinine, Ser: 0.81 mg/dL (ref 0.44–1.00)
GFR calc Af Amer: >60 mL/min (ref >60)
GFR calc Af Amer: >60 mL/min (ref >60)
GFR calc non Af Amer: 53 mL/min — ABNORMAL LOW (ref >60)
GFR calc non Af Amer: >60 mL/min (ref >60)
Glucose, Bld: 162 mg/dL — ABNORMAL HIGH (ref 70–99)
Glucose, Bld: 130 mg/dL — ABNORMAL HIGH (ref 70–99)
Potassium: 3.5 mmol/L (ref 3.5–5.1)
Potassium: 3.7 mmol/L (ref 3.5–5.1)
Sodium: 139 mmol/L (ref 135–145)
Sodium: 138 mmol/L (ref 135–145)
Total Bilirubin: 1 mg/dL (ref 0.3–1.2)
Total Bilirubin: 0.8 mg/dL (ref 0.3–1.2)
Total Protein: 6 g/dL — ABNORMAL LOW (ref 6.5–8.1)
Total Protein: 5.6 g/dL — ABNORMAL LOW (ref 6.5–8.1)

## 2018-10-05 LAB — PROTIME-INR
INR: 1.08
INR: 1.18
INR: 1.86
Prothrombin Time: 13.9 seconds (ref 11.4–15.2)
Prothrombin Time: 14.9 seconds (ref 11.4–15.2)
Prothrombin Time: 21.2 seconds — ABNORMAL HIGH (ref 11.4–15.2)

## 2018-10-05 LAB — HEMOGLOBIN A1C
Hgb A1c MFr Bld: 5.3 % (ref 4.8–5.6)
Hgb A1c MFr Bld: 5.3 % (ref 4.8–5.6)
Mean Plasma Glucose: 105.41 mg/dL
Mean Plasma Glucose: 105.41 mg/dL

## 2018-10-05 LAB — POCT I-STAT EG7
Acid-base deficit: 7 mmol/L — ABNORMAL HIGH (ref 0.0–2.0)
Bicarbonate: 19 mmol/L — ABNORMAL LOW (ref 20.0–28.0)
Calcium, Ion: 1.14 mmol/L — ABNORMAL LOW (ref 1.15–1.40)
HCT: 25 % — ABNORMAL LOW (ref 36.0–46.0)
Hemoglobin: 8.5 g/dL — ABNORMAL LOW (ref 12.0–15.0)
O2 Saturation: 62 %
Potassium: 3.8 mmol/L (ref 3.5–5.1)
Sodium: 143 mmol/L (ref 135–145)
TCO2: 20 mmol/L — ABNORMAL LOW (ref 22–32)
pCO2, Ven: 41.1 mmHg — ABNORMAL LOW (ref 44.0–60.0)
pH, Ven: 7.272 (ref 7.250–7.430)
pO2, Ven: 36 mmHg (ref 32.0–45.0)

## 2018-10-05 LAB — ECHO TEE
Height: 59.5 in
Weight: 2673.74 oz

## 2018-10-05 LAB — SURGICAL PCR SCREEN
MRSA, PCR: NEGATIVE
Staphylococcus aureus: NEGATIVE

## 2018-10-05 LAB — PREPARE RBC (CROSSMATCH)

## 2018-10-05 LAB — POCT I-STAT CREATININE: Creatinine, Ser: 1 mg/dL (ref 0.44–1.00)

## 2018-10-05 LAB — ABO/RH: ABO/RH(D): O POS

## 2018-10-05 LAB — TSH: TSH: 2.746 u[IU]/mL (ref 0.350–4.500)

## 2018-10-05 LAB — APTT
aPTT: 88 seconds — ABNORMAL HIGH (ref 24–36)
aPTT: >200 seconds (ref 24–36)
aPTT: 43 seconds — ABNORMAL HIGH (ref 24–36)

## 2018-10-05 LAB — POCT ACTIVATED CLOTTING TIME
Activated Clotting Time: 219 seconds
Activated Clotting Time: 290 seconds

## 2018-10-05 LAB — HEMOGLOBIN AND HEMATOCRIT, BLOOD
HCT: 22.5 % — ABNORMAL LOW (ref 36.0–46.0)
Hemoglobin: 7 g/dL — ABNORMAL LOW (ref 12.0–15.0)

## 2018-10-05 LAB — MAGNESIUM
Magnesium: 1.8 mg/dL (ref 1.7–2.4)
Magnesium: 2.7 mg/dL — ABNORMAL HIGH (ref 1.7–2.4)

## 2018-10-05 LAB — CREATININE, SERUM
Creatinine, Ser: 0.74 mg/dL (ref 0.44–1.00)
GFR calc Af Amer: >60 mL/min (ref >60)
GFR calc non Af Amer: >60 mL/min (ref >60)

## 2018-10-05 LAB — LACTIC ACID, PLASMA: Lactic Acid, Venous: 1 mmol/L (ref 0.5–1.9)

## 2018-10-05 LAB — TROPONIN I
Troponin I: 0.05 ng/mL (ref <0.03)
Troponin I: 14.05 ng/mL (ref <0.03)

## 2018-10-05 SURGERY — CORONARY/GRAFT ACUTE MI REVASCULARIZATION
Anesthesia: LOCAL

## 2018-10-05 SURGERY — CORONARY ARTERY BYPASS GRAFTING (CABG)
Anesthesia: General | Site: Chest

## 2018-10-05 MED ORDER — MILRINONE LACTATE IN DEXTROSE 20-5 MG/100ML-% IV SOLN
0.3000 ug/kg/min | INTRAVENOUS | Status: AC
  Administered 2018-10-05: .3 ug/kg/min via INTRAVENOUS
  Filled 2018-10-05: qty 100

## 2018-10-05 MED ORDER — INSULIN REGULAR(HUMAN) IN NACL 100-0.9 UT/100ML-% IV SOLN
INTRAVENOUS | Status: DC
  Filled 2018-10-05: qty 100

## 2018-10-05 MED ORDER — CANGRELOR BOLUS VIA INFUSION
INTRAVENOUS | Status: DC | PRN
  Administered 2018-10-05: 2274 ug via INTRAVENOUS

## 2018-10-05 MED ORDER — ASPIRIN EC 81 MG PO TBEC
81.0000 mg | DELAYED_RELEASE_TABLET | Freq: Every day | ORAL | Status: DC
  Filled 2018-10-05: qty 1

## 2018-10-05 MED ORDER — FENTANYL CITRATE (PF) 250 MCG/5ML IJ SOLN
INTRAMUSCULAR | Status: DC | PRN
  Administered 2018-10-05: 200 ug via INTRAVENOUS
  Administered 2018-10-05 (×3): 100 ug via INTRAVENOUS
  Administered 2018-10-05: 50 ug via INTRAVENOUS
  Administered 2018-10-05: 100 ug via INTRAVENOUS

## 2018-10-05 MED ORDER — ALBUMIN HUMAN 5 % IV SOLN
INTRAVENOUS | Status: AC
  Administered 2018-10-05: 12.5 g via INTRAVENOUS
  Filled 2018-10-05: qty 250

## 2018-10-05 MED ORDER — LACTATED RINGERS IV SOLN
INTRAVENOUS | Status: DC
  Administered 2018-10-05 – 2018-10-08 (×2): via INTRAVENOUS

## 2018-10-05 MED ORDER — PROTAMINE SULFATE 10 MG/ML IV SOLN
INTRAVENOUS | Status: AC
  Filled 2018-10-05: qty 25

## 2018-10-05 MED ORDER — NITROGLYCERIN 0.2 MG/ML ON CALL CATH LAB
INTRAVENOUS | Status: DC | PRN
  Administered 2018-10-05: 40 ug via INTRAVENOUS
  Administered 2018-10-05: 80 ug via INTRAVENOUS
  Administered 2018-10-05: 40 ug via INTRAVENOUS

## 2018-10-05 MED ORDER — INSULIN REGULAR BOLUS VIA INFUSION
0.0000 [IU] | Freq: Three times a day (TID) | INTRAVENOUS | Status: DC
  Filled 2018-10-05: qty 10

## 2018-10-05 MED ORDER — ACETAMINOPHEN 500 MG PO TABS
1000.0000 mg | ORAL_TABLET | Freq: Four times a day (QID) | ORAL | Status: AC
  Administered 2018-10-06 (×2): 1000 mg via ORAL
  Filled 2018-10-05 (×4): qty 2

## 2018-10-05 MED ORDER — SODIUM CHLORIDE 0.9% FLUSH: 3.0000 mL | INTRAVENOUS | Status: DC | PRN

## 2018-10-05 MED ORDER — SODIUM CHLORIDE 0.9 % IV SOLN: 250.0000 mL | INTRAVENOUS | Status: DC

## 2018-10-05 MED ORDER — LACTATED RINGERS IV SOLN
INTRAVENOUS | Status: DC | PRN
  Administered 2018-10-05: 08:00:00 via INTRAVENOUS

## 2018-10-05 MED ORDER — ACETAMINOPHEN 650 MG RE SUPP: 650.0000 mg | Freq: Once | RECTAL | Status: DC

## 2018-10-05 MED ORDER — SODIUM CHLORIDE 0.9 % IV SOLN
INTRAVENOUS | Status: AC | PRN
  Administered 2018-10-05: 10 mL/h via INTRAVENOUS

## 2018-10-05 MED ORDER — DEXMEDETOMIDINE HCL IN NACL 200 MCG/50ML IV SOLN
0.0000 ug/kg/h | INTRAVENOUS | Status: DC
  Administered 2018-10-05 – 2018-10-06 (×4): 0.7 ug/kg/h via INTRAVENOUS
  Administered 2018-10-06: 0.3 ug/kg/h via INTRAVENOUS
  Filled 2018-10-05 (×5): qty 50

## 2018-10-05 MED ORDER — PANTOPRAZOLE SODIUM 40 MG IV SOLR
40.0000 mg | INTRAVENOUS | Status: DC
  Filled 2018-10-05: qty 40

## 2018-10-05 MED ORDER — SODIUM CHLORIDE 0.9 % IV SOLN: 250.0000 mL | INTRAVENOUS | Status: DC | PRN

## 2018-10-05 MED ORDER — ORAL CARE MOUTH RINSE
15.0000 mL | OROMUCOSAL | Status: DC
  Administered 2018-10-05 – 2018-10-06 (×7): 15 mL via OROMUCOSAL

## 2018-10-05 MED ORDER — SODIUM CHLORIDE 0.9 % IV SOLN
INTRAVENOUS | Status: DC
  Filled 2018-10-05: qty 30

## 2018-10-05 MED ORDER — LEVALBUTEROL HCL 1.25 MG/0.5ML IN NEBU
1.2500 mg | INHALATION_SOLUTION | Freq: Four times a day (QID) | RESPIRATORY_TRACT | Status: DC
  Administered 2018-10-05: 1.25 mg via RESPIRATORY_TRACT
  Filled 2018-10-05: qty 0.5

## 2018-10-05 MED ORDER — POTASSIUM CHLORIDE 10 MEQ/50ML IV SOLN
10.0000 meq | INTRAVENOUS | Status: AC
  Administered 2018-10-05 (×3): 10 meq via INTRAVENOUS

## 2018-10-05 MED ORDER — LIDOCAINE HCL (PF) 1 % IJ SOLN
INTRAMUSCULAR | Status: AC
  Filled 2018-10-05: qty 30

## 2018-10-05 MED ORDER — IOHEXOL 350 MG/ML SOLN
INTRAVENOUS | Status: DC | PRN
  Administered 2018-10-05: 85 mL via INTRA_ARTERIAL

## 2018-10-05 MED ORDER — EPINEPHRINE PF 1 MG/ML IJ SOLN
0.0000 ug/min | INTRAVENOUS | Status: DC
  Filled 2018-10-05: qty 4

## 2018-10-05 MED ORDER — ACETAMINOPHEN 160 MG/5ML PO SOLN: 650.0000 mg | Freq: Once | ORAL | Status: DC

## 2018-10-05 MED ORDER — SODIUM CHLORIDE 0.9 % IV SOLN
INTRAVENOUS | Status: DC
  Administered 2018-10-05: 03:00:00 via INTRAVENOUS

## 2018-10-05 MED ORDER — ATORVASTATIN CALCIUM 80 MG PO TABS
80.0000 mg | ORAL_TABLET | Freq: Every day | ORAL | Status: DC
  Filled 2018-10-05: qty 1

## 2018-10-05 MED ORDER — VERAPAMIL HCL 2.5 MG/ML IV SOLN
INTRAVENOUS | Status: DC | PRN
  Administered 2018-10-05: 10 mL via INTRA_ARTERIAL

## 2018-10-05 MED ORDER — 0.9 % SODIUM CHLORIDE (POUR BTL) OPTIME
TOPICAL | Status: DC | PRN
  Administered 2018-10-05: 6000 mL

## 2018-10-05 MED ORDER — ASPIRIN 81 MG PO CHEW
324.0000 mg | CHEWABLE_TABLET | Freq: Every day | ORAL | Status: DC
  Administered 2018-10-06: 324 mg
  Filled 2018-10-05: qty 4

## 2018-10-05 MED ORDER — VANCOMYCIN HCL IN DEXTROSE 1-5 GM/200ML-% IV SOLN
1000.0000 mg | Freq: Once | INTRAVENOUS | Status: AC
  Administered 2018-10-05: 1000 mg via INTRAVENOUS
  Filled 2018-10-05: qty 200

## 2018-10-05 MED ORDER — DOCUSATE SODIUM 100 MG PO CAPS
200.0000 mg | ORAL_CAPSULE | Freq: Every day | ORAL | Status: DC
  Administered 2018-10-07 – 2018-10-11 (×5): 200 mg via ORAL
  Filled 2018-10-05 (×5): qty 2

## 2018-10-05 MED ORDER — LEVALBUTEROL HCL 1.25 MG/0.5ML IN NEBU
1.2500 mg | INHALATION_SOLUTION | Freq: Four times a day (QID) | RESPIRATORY_TRACT | Status: DC
  Administered 2018-10-06 – 2018-10-07 (×6): 1.25 mg via RESPIRATORY_TRACT
  Filled 2018-10-05 (×6): qty 0.5

## 2018-10-05 MED ORDER — ETOMIDATE 2 MG/ML IV SOLN
INTRAVENOUS | Status: DC | PRN
  Administered 2018-10-05: 18 mg via INTRAVENOUS

## 2018-10-05 MED ORDER — FENTANYL CITRATE (PF) 250 MCG/5ML IJ SOLN
INTRAMUSCULAR | Status: AC
  Filled 2018-10-05: qty 25

## 2018-10-05 MED ORDER — TRANEXAMIC ACID 1000 MG/10ML IV SOLN
1.5000 mg/kg/h | INTRAVENOUS | Status: AC
  Administered 2018-10-05: 1.5 mg/kg/h via INTRAVENOUS
  Filled 2018-10-05: qty 25

## 2018-10-05 MED ORDER — TRAMADOL HCL 50 MG PO TABS: 50.0000 mg | ORAL_TABLET | ORAL | Status: DC | PRN

## 2018-10-05 MED ORDER — SODIUM CHLORIDE 0.9 % IV SOLN
INTRAVENOUS | Status: DC | PRN
  Administered 2018-10-05: 0.4 ug/kg/h via INTRAVENOUS

## 2018-10-05 MED ORDER — ALBUMIN HUMAN 5 % IV SOLN
250.0000 mL | INTRAVENOUS | Status: AC | PRN
  Administered 2018-10-05 (×4): 12.5 g via INTRAVENOUS
  Filled 2018-10-05 (×2): qty 250

## 2018-10-05 MED ORDER — SODIUM CHLORIDE 0.9 % IV SOLN
1.5000 g | INTRAVENOUS | Status: AC
  Administered 2018-10-05: 1.5 g via INTRAVENOUS
  Filled 2018-10-05: qty 1.5

## 2018-10-05 MED ORDER — SODIUM CHLORIDE 0.9% FLUSH: 10.0000 mL | INTRAVENOUS | Status: DC | PRN

## 2018-10-05 MED ORDER — SODIUM CHLORIDE 0.9 % IV SOLN
INTRAVENOUS | Status: DC | PRN
  Administered 2018-10-05: 20 ug/min via INTRAVENOUS

## 2018-10-05 MED ORDER — CALCIUM CHLORIDE 10 % IV SOLN
1.0000 g | Freq: Once | INTRAVENOUS | Status: AC
  Administered 2018-10-05: 1 g via INTRAVENOUS

## 2018-10-05 MED ORDER — TRANEXAMIC ACID (OHS) PUMP PRIME SOLUTION
2.0000 mg/kg | INTRAVENOUS | Status: DC
  Filled 2018-10-05: qty 1.52

## 2018-10-05 MED ORDER — NOREPINEPHRINE BITARTRATE 1 MG/ML IV SOLN
INTRAVENOUS | Status: DC | PRN
  Administered 2018-10-05: 10 ug/min via INTRAVENOUS

## 2018-10-05 MED ORDER — LIDOCAINE HCL (CARDIAC) PF 100 MG/5ML IV SOSY
PREFILLED_SYRINGE | INTRAVENOUS | Status: DC | PRN
  Administered 2018-10-05: 80 mg via INTRAVENOUS

## 2018-10-05 MED ORDER — ALBUMIN HUMAN 5 % IV SOLN
INTRAVENOUS | Status: DC | PRN
  Administered 2018-10-05: 12:00:00 via INTRAVENOUS

## 2018-10-05 MED ORDER — SODIUM CHLORIDE 0.9 % IV SOLN: INTRAVENOUS | Status: DC

## 2018-10-05 MED ORDER — MAGNESIUM SULFATE 4 GM/100ML IV SOLN
4.0000 g | Freq: Once | INTRAVENOUS | Status: AC
  Administered 2018-10-05: 4 g via INTRAVENOUS
  Filled 2018-10-05: qty 100

## 2018-10-05 MED ORDER — VANCOMYCIN HCL 1000 MG IV SOLR
INTRAVENOUS | Status: DC
  Filled 2018-10-05: qty 1000

## 2018-10-05 MED ORDER — ASPIRIN 81 MG PO CHEW: 324.0000 mg | CHEWABLE_TABLET | Freq: Once | ORAL | Status: AC

## 2018-10-05 MED ORDER — LACTATED RINGERS IV SOLN
INTRAVENOUS | Status: DC | PRN
  Administered 2018-10-05 (×2): via INTRAVENOUS

## 2018-10-05 MED ORDER — HEPARIN (PORCINE) IN NACL 1000-0.9 UT/500ML-% IV SOLN
INTRAVENOUS | Status: AC
  Filled 2018-10-05: qty 1000

## 2018-10-05 MED ORDER — MIDAZOLAM HCL 2 MG/2ML IJ SOLN
INTRAMUSCULAR | Status: DC | PRN
  Administered 2018-10-05: 0.5 mg via INTRAVENOUS

## 2018-10-05 MED ORDER — MIDAZOLAM HCL (PF) 10 MG/2ML IJ SOLN
INTRAMUSCULAR | Status: AC
  Filled 2018-10-05: qty 2

## 2018-10-05 MED ORDER — HEPARIN (PORCINE) 25000 UT/250ML-% IV SOLN
800.0000 [IU]/h | INTRAVENOUS | Status: DC
  Filled 2018-10-05: qty 250

## 2018-10-05 MED ORDER — MILRINONE LACTATE IN DEXTROSE 20-5 MG/100ML-% IV SOLN
0.3000 ug/kg/min | INTRAVENOUS | Status: DC
  Administered 2018-10-05 – 2018-10-07 (×3): 0.3 ug/kg/min via INTRAVENOUS
  Filled 2018-10-05 (×3): qty 100

## 2018-10-05 MED ORDER — METOPROLOL TARTRATE 12.5 MG HALF TABLET: 12.5000 mg | ORAL_TABLET | Freq: Two times a day (BID) | ORAL | Status: DC

## 2018-10-05 MED ORDER — THROMBIN 20000 UNITS EX SOLR
CUTANEOUS | Status: DC | PRN
  Administered 2018-10-05: 20000 [IU] via TOPICAL

## 2018-10-05 MED ORDER — LACTATED RINGERS IV SOLN
500.0000 mL | Freq: Once | INTRAVENOUS | Status: AC | PRN
  Administered 2018-10-05: 250 mL via INTRAVENOUS

## 2018-10-05 MED ORDER — MIDAZOLAM HCL 5 MG/5ML IJ SOLN
INTRAMUSCULAR | Status: DC | PRN
  Administered 2018-10-05: 2 mg via INTRAVENOUS
  Administered 2018-10-05: 1 mg via INTRAVENOUS

## 2018-10-05 MED ORDER — ARTIFICIAL TEARS OPHTHALMIC OINT
TOPICAL_OINTMENT | OPHTHALMIC | Status: AC
  Filled 2018-10-05: qty 3.5

## 2018-10-05 MED ORDER — BISACODYL 10 MG RE SUPP: 10.0000 mg | Freq: Every day | RECTAL | Status: DC

## 2018-10-05 MED ORDER — ONDANSETRON HCL 4 MG/2ML IJ SOLN
4.0000 mg | Freq: Four times a day (QID) | INTRAMUSCULAR | Status: DC | PRN
  Administered 2018-10-05: 4 mg via INTRAVENOUS
  Filled 2018-10-05: qty 2

## 2018-10-05 MED ORDER — POTASSIUM CHLORIDE 2 MEQ/ML IV SOLN
80.0000 meq | INTRAVENOUS | Status: DC
  Filled 2018-10-05: qty 40

## 2018-10-05 MED ORDER — NITROGLYCERIN 1 MG/10 ML FOR IR/CATH LAB
INTRA_ARTERIAL | Status: AC
  Filled 2018-10-05: qty 10

## 2018-10-05 MED ORDER — MAGNESIUM SULFATE 50 % IJ SOLN
40.0000 meq | INTRAMUSCULAR | Status: DC
  Administered 2018-10-05: 40 meq
  Filled 2018-10-05: qty 9.85

## 2018-10-05 MED ORDER — NITROGLYCERIN IN D5W 200-5 MCG/ML-% IV SOLN
2.0000 ug/min | INTRAVENOUS | Status: AC
  Administered 2018-10-05: 10 ug/min via INTRAVENOUS
  Filled 2018-10-05: qty 250

## 2018-10-05 MED ORDER — SODIUM CHLORIDE 0.9% FLUSH: 3.0000 mL | Freq: Two times a day (BID) | INTRAVENOUS | Status: DC

## 2018-10-05 MED ORDER — MIDAZOLAM HCL 2 MG/2ML IJ SOLN
INTRAMUSCULAR | Status: AC
  Filled 2018-10-05: qty 2

## 2018-10-05 MED ORDER — MUPIROCIN 2 % EX OINT
1.0000 "application " | TOPICAL_OINTMENT | Freq: Two times a day (BID) | CUTANEOUS | Status: DC
  Administered 2018-10-05: 1 via NASAL
  Filled 2018-10-05: qty 22

## 2018-10-05 MED ORDER — ROCURONIUM BROMIDE 50 MG/5ML IV SOSY
PREFILLED_SYRINGE | INTRAVENOUS | Status: AC
  Filled 2018-10-05: qty 15

## 2018-10-05 MED ORDER — DOPAMINE-DEXTROSE 3.2-5 MG/ML-% IV SOLN
0.0000 ug/kg/min | INTRAVENOUS | Status: DC
  Administered 2018-10-07: 3 ug/kg/min via INTRAVENOUS
  Filled 2018-10-05: qty 250

## 2018-10-05 MED ORDER — BISACODYL 5 MG PO TBEC
10.0000 mg | DELAYED_RELEASE_TABLET | Freq: Every day | ORAL | Status: DC
  Administered 2018-10-06 – 2018-10-11 (×6): 10 mg via ORAL
  Filled 2018-10-05 (×6): qty 2

## 2018-10-05 MED ORDER — LIDOCAINE HCL (PF) 1 % IJ SOLN
INTRAMUSCULAR | Status: DC | PRN
  Administered 2018-10-05: 5 mL
  Administered 2018-10-05: 10 mL
  Administered 2018-10-05: 15 mL

## 2018-10-05 MED ORDER — INSULIN REGULAR(HUMAN) IN NACL 100-0.9 UT/100ML-% IV SOLN: INTRAVENOUS | Status: DC

## 2018-10-05 MED ORDER — PROTAMINE SULFATE 10 MG/ML IV SOLN
INTRAVENOUS | Status: DC | PRN
  Administered 2018-10-05: 260 mg via INTRAVENOUS

## 2018-10-05 MED ORDER — HEPARIN SODIUM (PORCINE) 1000 UNIT/ML IJ SOLN
INTRAMUSCULAR | Status: DC | PRN
  Administered 2018-10-05: 3000 [IU] via INTRAVENOUS
  Administered 2018-10-05: 4000 [IU] via INTRAVENOUS

## 2018-10-05 MED ORDER — LIDOCAINE 2% (20 MG/ML) 5 ML SYRINGE
INTRAMUSCULAR | Status: AC
  Filled 2018-10-05: qty 5

## 2018-10-05 MED ORDER — SODIUM CHLORIDE (PF) 0.9 % IJ SOLN
INTRAMUSCULAR | Status: AC
  Filled 2018-10-05: qty 20

## 2018-10-05 MED ORDER — FENTANYL CITRATE (PF) 100 MCG/2ML IJ SOLN
INTRAMUSCULAR | Status: DC | PRN
  Administered 2018-10-05 (×3): 25 ug via INTRAVENOUS

## 2018-10-05 MED ORDER — ACETAMINOPHEN 160 MG/5ML PO SOLN
1000.0000 mg | Freq: Four times a day (QID) | ORAL | Status: DC
  Administered 2018-10-05 – 2018-10-06 (×2): 1000 mg
  Filled 2018-10-05 (×2): qty 40.6

## 2018-10-05 MED ORDER — FAMOTIDINE IN NACL 20-0.9 MG/50ML-% IV SOLN
20.0000 mg | Freq: Two times a day (BID) | INTRAVENOUS | Status: AC
  Administered 2018-10-05: 20 mg via INTRAVENOUS
  Filled 2018-10-05 (×2): qty 50

## 2018-10-05 MED ORDER — VANCOMYCIN HCL 10 G IV SOLR
1250.0000 mg | INTRAVENOUS | Status: AC
  Administered 2018-10-05: 1250 mg via INTRAVENOUS
  Filled 2018-10-05: qty 1250

## 2018-10-05 MED ORDER — CHLORHEXIDINE GLUCONATE CLOTH 2 % EX PADS
6.0000 | MEDICATED_PAD | Freq: Once | CUTANEOUS | Status: AC
  Administered 2018-10-05: 6 via TOPICAL

## 2018-10-05 MED ORDER — NITROGLYCERIN IN D5W 200-5 MCG/ML-% IV SOLN: 0.0000 ug/min | INTRAVENOUS | Status: DC

## 2018-10-05 MED ORDER — SODIUM CHLORIDE 0.9 % IV SOLN
INTRAVENOUS | Status: DC | PRN
  Administered 2018-10-05: 750 mg via INTRAVENOUS

## 2018-10-05 MED ORDER — ETOMIDATE 2 MG/ML IV SOLN
INTRAVENOUS | Status: AC
  Filled 2018-10-05: qty 10

## 2018-10-05 MED ORDER — HEPARIN SODIUM (PORCINE) 1000 UNIT/ML IJ SOLN
INTRAMUSCULAR | Status: AC
  Filled 2018-10-05: qty 1

## 2018-10-05 MED ORDER — DEXMEDETOMIDINE HCL IN NACL 400 MCG/100ML IV SOLN
0.1000 ug/kg/h | INTRAVENOUS | Status: DC
  Filled 2018-10-05: qty 100

## 2018-10-05 MED ORDER — PROPOFOL 10 MG/ML IV BOLUS
INTRAVENOUS | Status: AC
  Filled 2018-10-05: qty 20

## 2018-10-05 MED ORDER — INSULIN ASPART 100 UNIT/ML ~~LOC~~ SOLN
0.0000 [IU] | SUBCUTANEOUS | Status: DC
  Administered 2018-10-05: 2 [IU] via SUBCUTANEOUS

## 2018-10-05 MED ORDER — PHENYLEPHRINE HCL-NACL 20-0.9 MG/250ML-% IV SOLN
0.0000 ug/min | INTRAVENOUS | Status: DC
  Administered 2018-10-06: 20 ug/min via INTRAVENOUS
  Administered 2018-10-06: 30 ug/min via INTRAVENOUS
  Administered 2018-10-07: 35 ug/min via INTRAVENOUS
  Administered 2018-10-07: 35.067 ug/min via INTRAVENOUS
  Administered 2018-10-08: 30 ug/min via INTRAVENOUS
  Filled 2018-10-05 (×6): qty 250

## 2018-10-05 MED ORDER — SODIUM CHLORIDE 0.9 % IV SOLN
INTRAVENOUS | Status: DC | PRN
  Administered 2018-10-05: 1.1 [IU]/h via INTRAVENOUS

## 2018-10-05 MED ORDER — SODIUM BICARBONATE 8.4 % IV SOLN
50.0000 meq | Freq: Once | INTRAVENOUS | Status: AC
  Administered 2018-10-05: 50 meq via INTRAVENOUS

## 2018-10-05 MED ORDER — SODIUM CHLORIDE 0.9% FLUSH
3.0000 mL | Freq: Two times a day (BID) | INTRAVENOUS | Status: DC
  Administered 2018-10-06 – 2018-10-13 (×13): 3 mL via INTRAVENOUS

## 2018-10-05 MED ORDER — NOREPINEPHRINE-SODIUM CHLORIDE 4-0.9 MG/250ML-% IV SOLN
0.0000 ug/min | INTRAVENOUS | Status: DC
  Filled 2018-10-05: qty 250

## 2018-10-05 MED ORDER — SODIUM CHLORIDE 0.45 % IV SOLN
INTRAVENOUS | Status: DC | PRN
  Administered 2018-10-07: 02:00:00 via INTRAVENOUS

## 2018-10-05 MED ORDER — ROCURONIUM BROMIDE 100 MG/10ML IV SOLN
INTRAVENOUS | Status: DC | PRN
  Administered 2018-10-05: 30 mg via INTRAVENOUS
  Administered 2018-10-05: 100 mg via INTRAVENOUS

## 2018-10-05 MED ORDER — DOPAMINE-DEXTROSE 3.2-5 MG/ML-% IV SOLN
0.0000 ug/kg/min | INTRAVENOUS | Status: AC
  Administered 2018-10-05: 3 ug/kg/min via INTRAVENOUS
  Filled 2018-10-05: qty 250

## 2018-10-05 MED ORDER — ALBUMIN HUMAN 5 % IV SOLN
12.5000 g | Freq: Once | INTRAVENOUS | Status: AC
  Administered 2018-10-05: 12.5 g via INTRAVENOUS

## 2018-10-05 MED ORDER — METOPROLOL TARTRATE 25 MG/10 ML ORAL SUSPENSION: 12.5000 mg | Freq: Two times a day (BID) | ORAL | Status: DC

## 2018-10-05 MED ORDER — CANGRELOR TETRASODIUM 50 MG IV SOLR
INTRAVENOUS | Status: AC
  Filled 2018-10-05: qty 50

## 2018-10-05 MED ORDER — CHLORHEXIDINE GLUCONATE 0.12% ORAL RINSE (MEDLINE KIT)
15.0000 mL | Freq: Two times a day (BID) | OROMUCOSAL | Status: DC
  Administered 2018-10-05 – 2018-10-06 (×2): 15 mL via OROMUCOSAL

## 2018-10-05 MED ORDER — PHENYLEPHRINE 40 MCG/ML (10ML) SYRINGE FOR IV PUSH (FOR BLOOD PRESSURE SUPPORT)
PREFILLED_SYRINGE | INTRAVENOUS | Status: AC
  Filled 2018-10-05: qty 30

## 2018-10-05 MED ORDER — PANTOPRAZOLE SODIUM 40 MG PO TBEC
40.0000 mg | DELAYED_RELEASE_TABLET | Freq: Every day | ORAL | Status: DC
  Administered 2018-10-07 – 2018-10-14 (×8): 40 mg via ORAL
  Filled 2018-10-05 (×9): qty 1

## 2018-10-05 MED ORDER — PROPOFOL 10 MG/ML IV BOLUS
INTRAVENOUS | Status: DC | PRN
  Administered 2018-10-05: 40 mg via INTRAVENOUS

## 2018-10-05 MED ORDER — PHENYLEPHRINE HCL-NACL 20-0.9 MG/250ML-% IV SOLN
30.0000 ug/min | INTRAVENOUS | Status: DC
  Filled 2018-10-05: qty 250

## 2018-10-05 MED ORDER — HEPARIN (PORCINE) IN NACL 1000-0.9 UT/500ML-% IV SOLN
INTRAVENOUS | Status: DC | PRN
  Administered 2018-10-05 (×4): 500 mL

## 2018-10-05 MED ORDER — THROMBIN (RECOMBINANT) 20000 UNITS EX SOLR
CUTANEOUS | Status: AC
  Filled 2018-10-05: qty 20000

## 2018-10-05 MED ORDER — ASPIRIN EC 325 MG PO TBEC
325.0000 mg | DELAYED_RELEASE_TABLET | Freq: Every day | ORAL | Status: DC
  Administered 2018-10-07 – 2018-10-10 (×4): 325 mg via ORAL
  Filled 2018-10-05 (×4): qty 1

## 2018-10-05 MED ORDER — CHLORHEXIDINE GLUCONATE 0.12 % MT SOLN
15.0000 mL | OROMUCOSAL | Status: AC
  Administered 2018-10-05: 15 mL via OROMUCOSAL

## 2018-10-05 MED ORDER — ALBUMIN HUMAN 5 % IV SOLN: INTRAVENOUS | Status: DC | PRN

## 2018-10-05 MED ORDER — ACETAMINOPHEN 325 MG PO TABS: 650.0000 mg | ORAL_TABLET | ORAL | Status: DC | PRN

## 2018-10-05 MED ORDER — SODIUM CHLORIDE 0.9% IV SOLUTION: Freq: Once | INTRAVENOUS | Status: DC

## 2018-10-05 MED ORDER — LACTATED RINGERS IV SOLN
INTRAVENOUS | Status: DC
  Administered 2018-10-08: 19:00:00 via INTRAVENOUS

## 2018-10-05 MED ORDER — MIDAZOLAM HCL 2 MG/2ML IJ SOLN
2.0000 mg | INTRAMUSCULAR | Status: DC | PRN
  Administered 2018-10-05 – 2018-10-06 (×4): 2 mg via INTRAVENOUS
  Filled 2018-10-05 (×4): qty 2

## 2018-10-05 MED ORDER — LACTATED RINGERS IV SOLN
INTRAVENOUS | Status: DC | PRN
  Administered 2018-10-05 (×2): via INTRAVENOUS

## 2018-10-05 MED ORDER — CHLORHEXIDINE GLUCONATE 0.12 % MT SOLN: 15.0000 mL | Freq: Once | OROMUCOSAL | Status: DC

## 2018-10-05 MED ORDER — SODIUM CHLORIDE 0.9 % IV SOLN
INTRAVENOUS | Status: DC
  Administered 2018-10-05: 14:00:00 via INTRAVENOUS

## 2018-10-05 MED ORDER — SODIUM CHLORIDE 0.9 % IV SOLN
1.5000 g | Freq: Two times a day (BID) | INTRAVENOUS | Status: AC
  Administered 2018-10-05 – 2018-10-07 (×4): 1.5 g via INTRAVENOUS
  Filled 2018-10-05 (×4): qty 1.5

## 2018-10-05 MED ORDER — SODIUM CHLORIDE 0.9% FLUSH
10.0000 mL | Freq: Two times a day (BID) | INTRAVENOUS | Status: DC
  Administered 2018-10-05 – 2018-10-07 (×5): 10 mL
  Administered 2018-10-08: 20 mL
  Administered 2018-10-09 – 2018-10-10 (×3): 10 mL

## 2018-10-05 MED ORDER — ACETAMINOPHEN 325 MG PO TABS
650.0000 mg | ORAL_TABLET | ORAL | Status: DC | PRN
  Filled 2018-10-05: qty 2

## 2018-10-05 MED ORDER — HEPARIN SODIUM (PORCINE) 1000 UNIT/ML IJ SOLN
INTRAMUSCULAR | Status: AC
  Filled 2018-10-05: qty 2

## 2018-10-05 MED ORDER — HEPARIN SODIUM (PORCINE) 1000 UNIT/ML IJ SOLN
INTRAMUSCULAR | Status: DC | PRN
  Administered 2018-10-05: 26000 [IU] via INTRAVENOUS

## 2018-10-05 MED ORDER — FENTANYL CITRATE (PF) 100 MCG/2ML IJ SOLN
INTRAMUSCULAR | Status: AC
  Filled 2018-10-05: qty 2

## 2018-10-05 MED ORDER — OXYCODONE HCL 5 MG PO TABS: 5.0000 mg | ORAL_TABLET | ORAL | Status: DC | PRN

## 2018-10-05 MED ORDER — ONDANSETRON HCL 4 MG/2ML IJ SOLN
4.0000 mg | Freq: Four times a day (QID) | INTRAMUSCULAR | Status: DC | PRN
  Administered 2018-10-06 – 2018-10-12 (×6): 4 mg via INTRAVENOUS
  Filled 2018-10-05 (×6): qty 2

## 2018-10-05 MED ORDER — VERAPAMIL HCL 2.5 MG/ML IV SOLN
INTRAVENOUS | Status: AC
  Filled 2018-10-05: qty 2

## 2018-10-05 MED ORDER — PHENYLEPHRINE HCL 10 MG/ML IJ SOLN
INTRAMUSCULAR | Status: DC | PRN
  Administered 2018-10-05: 160 ug via INTRAVENOUS
  Administered 2018-10-05: 80 ug via INTRAVENOUS

## 2018-10-05 MED ORDER — HEPARIN SODIUM (PORCINE) 5000 UNIT/ML IJ SOLN
4000.0000 [IU] | Freq: Once | INTRAMUSCULAR | Status: AC
  Administered 2018-10-05: 4000 [IU] via INTRAVENOUS

## 2018-10-05 MED ORDER — LACTATED RINGERS IV SOLN
INTRAVENOUS | Status: DC | PRN
  Administered 2018-10-05 (×3): via INTRAVENOUS

## 2018-10-05 MED ORDER — MORPHINE SULFATE (PF) 2 MG/ML IV SOLN
1.0000 mg | INTRAVENOUS | Status: DC | PRN
  Administered 2018-10-05 – 2018-10-06 (×3): 2 mg via INTRAVENOUS
  Administered 2018-10-06: 4 mg via INTRAVENOUS
  Administered 2018-10-06 – 2018-10-07 (×3): 2 mg via INTRAVENOUS
  Administered 2018-10-07: 4 mg via INTRAVENOUS
  Administered 2018-10-07 (×2): 2 mg via INTRAVENOUS
  Filled 2018-10-05 (×4): qty 1
  Filled 2018-10-05: qty 2
  Filled 2018-10-05 (×3): qty 1
  Filled 2018-10-05: qty 2
  Filled 2018-10-05 (×2): qty 1

## 2018-10-05 MED ORDER — TRANEXAMIC ACID (OHS) BOLUS VIA INFUSION
15.0000 mg/kg | INTRAVENOUS | Status: AC
  Administered 2018-10-05: 1137 mg via INTRAVENOUS
  Filled 2018-10-05: qty 1137

## 2018-10-05 MED ORDER — CHLORHEXIDINE GLUCONATE CLOTH 2 % EX PADS
6.0000 | MEDICATED_PAD | Freq: Every day | CUTANEOUS | Status: DC
  Administered 2018-10-05 – 2018-10-10 (×4): 6 via TOPICAL

## 2018-10-05 MED ORDER — SODIUM CHLORIDE 0.9 % IV SOLN
750.0000 mg | INTRAVENOUS | Status: DC
  Filled 2018-10-05: qty 750

## 2018-10-05 MED ORDER — PLASMA-LYTE 148 IV SOLN
INTRAVENOUS | Status: AC
  Administered 2018-10-05: 500 mL
  Filled 2018-10-05: qty 2.5

## 2018-10-05 MED ORDER — METOPROLOL TARTRATE 5 MG/5ML IV SOLN: 2.5000 mg | INTRAVENOUS | Status: DC | PRN

## 2018-10-05 MED ORDER — HEMOSTATIC AGENTS (NO CHARGE) OPTIME
TOPICAL | Status: DC | PRN
  Administered 2018-10-05 (×4): 1 via TOPICAL

## 2018-10-05 SURGICAL SUPPLY — 113 items
BAG DECANTER FOR FLEXI CONT (MISCELLANEOUS) ×6 IMPLANT
BANDAGE ACE 4X5 VEL STRL LF (GAUZE/BANDAGES/DRESSINGS) ×3 IMPLANT
BANDAGE ACE 6X5 VEL STRL LF (GAUZE/BANDAGES/DRESSINGS) ×3 IMPLANT
BANDAGE ELASTIC 4 VELCRO ST LF (GAUZE/BANDAGES/DRESSINGS) ×3 IMPLANT
BANDAGE ELASTIC 6 VELCRO ST LF (GAUZE/BANDAGES/DRESSINGS) ×3 IMPLANT
BASKET HEART  (ORDER IN 25'S) (MISCELLANEOUS) ×1
BASKET HEART (ORDER IN 25'S) (MISCELLANEOUS) ×1
BASKET HEART (ORDER IN 25S) (MISCELLANEOUS) ×1 IMPLANT
BLADE CLIPPER SURG (BLADE) IMPLANT
BLADE STERNUM SYSTEM 6 (BLADE) ×3 IMPLANT
BNDG GAUZE ELAST 4 BULKY (GAUZE/BANDAGES/DRESSINGS) ×3 IMPLANT
CANISTER SUCT 3000ML PPV (MISCELLANEOUS) ×3 IMPLANT
CANNULA EZ GLIDE AORTIC 21FR (CANNULA) IMPLANT
CATH CPB KIT OWEN (MISCELLANEOUS) IMPLANT
CATH ROBINSON RED A/P 18FR (CATHETERS) ×6 IMPLANT
CATH THORACIC 28FR (CATHETERS) ×3 IMPLANT
CATH THORACIC 36FR (CATHETERS) ×3 IMPLANT
CATH THORACIC 36FR RT ANG (CATHETERS) ×3 IMPLANT
CLIP LIGATING EXTRA MED SLVR (CLIP) ×6 IMPLANT
CLIP RETRACTION 3.0MM CORONARY (MISCELLANEOUS) ×3 IMPLANT
CLIP VESOCCLUDE MED 24/CT (CLIP) IMPLANT
CLIP VESOCCLUDE SM WIDE 24/CT (CLIP) IMPLANT
COVER PROBE W GEL 5X96 (DRAPES) ×3 IMPLANT
COVER WAND RF STERILE (DRAPES) ×3 IMPLANT
CRADLE DONUT ADULT HEAD (MISCELLANEOUS) ×3 IMPLANT
DRAIN CHANNEL 32F RND 10.7 FF (WOUND CARE) IMPLANT
DRAPE CARDIOVASCULAR INCISE (DRAPES) ×3
DRAPE INCISE IOBAN 66X45 STRL (DRAPES) ×3 IMPLANT
DRAPE SLUSH/WARMER DISC (DRAPES) ×3 IMPLANT
DRAPE SRG 135X102X78XABS (DRAPES) ×1 IMPLANT
DRSG COVADERM 4X14 (GAUZE/BANDAGES/DRESSINGS) ×3 IMPLANT
ELECT BLADE 4.0 EZ CLEAN MEGAD (MISCELLANEOUS) ×3
ELECT REM PT RETURN 9FT ADLT (ELECTROSURGICAL) ×6
ELECTRODE BLDE 4.0 EZ CLN MEGD (MISCELLANEOUS) ×1 IMPLANT
ELECTRODE REM PT RTRN 9FT ADLT (ELECTROSURGICAL) ×2 IMPLANT
FELT TEFLON 1X6 (MISCELLANEOUS) ×6 IMPLANT
GAUZE SPONGE 4X4 12PLY STRL (GAUZE/BANDAGES/DRESSINGS) ×6 IMPLANT
GAUZE SPONGE 4X4 12PLY STRL LF (GAUZE/BANDAGES/DRESSINGS) ×6 IMPLANT
GLOVE BIO SURGEON STRL SZ 6.5 (GLOVE) ×12 IMPLANT
GLOVE BIO SURGEON STRL SZ7.5 (GLOVE) ×9 IMPLANT
GLOVE BIO SURGEONS STRL SZ 6.5 (GLOVE) ×6
GLOVE BIOGEL PI IND STRL 8 (GLOVE) ×3 IMPLANT
GLOVE BIOGEL PI INDICATOR 8 (GLOVE) ×6
GLOVE ECLIPSE 8.0 STRL XLNG CF (GLOVE) ×9 IMPLANT
GLOVE EUDERMIC 7 POWDERFREE (GLOVE) ×6 IMPLANT
GLOVE ORTHO TXT STRL SZ7.5 (GLOVE) IMPLANT
GOWN STRL REUS W/ TWL LRG LVL3 (GOWN DISPOSABLE) ×7 IMPLANT
GOWN STRL REUS W/TWL LRG LVL3 (GOWN DISPOSABLE) ×21
HEMOSTAT POWDER SURGIFOAM 1G (HEMOSTASIS) ×9 IMPLANT
HEMOSTAT SURGICEL 2X14 (HEMOSTASIS) ×3 IMPLANT
KIT BASIN OR (CUSTOM PROCEDURE TRAY) ×3 IMPLANT
KIT CATH CPB BARTLE (MISCELLANEOUS) ×3 IMPLANT
KIT SUCTION CATH 14FR (SUCTIONS) ×9 IMPLANT
KIT TURNOVER KIT B (KITS) ×3 IMPLANT
KIT VASOVIEW HEMOPRO 2 VH 4000 (KITS) ×3 IMPLANT
MARKER SKIN DUAL TIP RULER LAB (MISCELLANEOUS) ×3 IMPLANT
NS IRRIG 1000ML POUR BTL (IV SOLUTION) ×18 IMPLANT
PACK E OPEN HEART (SUTURE) ×3 IMPLANT
PACK OPEN HEART (CUSTOM PROCEDURE TRAY) ×3 IMPLANT
PAD ARMBOARD 7.5X6 YLW CONV (MISCELLANEOUS) ×6 IMPLANT
PAD ELECT DEFIB RADIOL ZOLL (MISCELLANEOUS) ×3 IMPLANT
PEN SKIN MARKING BROAD (MISCELLANEOUS) ×3 IMPLANT
PENCIL BUTTON HOLSTER BLD 10FT (ELECTRODE) ×3 IMPLANT
PUNCH AORTIC ROTATE 4.0MM (MISCELLANEOUS) IMPLANT
PUNCH AORTIC ROTATE 4.5MM 8IN (MISCELLANEOUS) IMPLANT
PUNCH AORTIC ROTATE 5MM 8IN (MISCELLANEOUS) IMPLANT
SET CARDIOPLEGIA MPS 5001102 (MISCELLANEOUS) ×3 IMPLANT
SLEEVE SURGEON STRL (DRAPES) ×3 IMPLANT
SOLUTION ANTI FOG 6CC (MISCELLANEOUS) ×3 IMPLANT
SPONGE LAP 18X18 X RAY DECT (DISPOSABLE) ×3 IMPLANT
SPONGE LAP 4X18 RFD (DISPOSABLE) ×3 IMPLANT
STOPCOCK 4 WAY LG BORE MALE ST (IV SETS) ×3 IMPLANT
SUT BONE WAX W31G (SUTURE) ×3 IMPLANT
SUT MNCRL AB 4-0 PS2 18 (SUTURE) IMPLANT
SUT PDS AB 1 CTX 36 (SUTURE) IMPLANT
SUT PROLENE 2 0 SH DA (SUTURE) IMPLANT
SUT PROLENE 3 0 SH DA (SUTURE) ×3 IMPLANT
SUT PROLENE 3 0 SH1 36 (SUTURE) ×3 IMPLANT
SUT PROLENE 4 0 RB 1 (SUTURE)
SUT PROLENE 4 0 SH DA (SUTURE) IMPLANT
SUT PROLENE 4-0 RB1 .5 CRCL 36 (SUTURE) IMPLANT
SUT PROLENE 5 0 C 1 36 (SUTURE) IMPLANT
SUT PROLENE 6 0 C 1 30 (SUTURE) IMPLANT
SUT PROLENE 7 0 BV1 MDA (SUTURE) ×3 IMPLANT
SUT PROLENE 8 0 BV175 6 (SUTURE) IMPLANT
SUT PROLENE BLUE 7 0 (SUTURE) IMPLANT
SUT PROLENE POLY MONO (SUTURE) IMPLANT
SUT SILK  1 MH (SUTURE) ×2
SUT SILK 1 MH (SUTURE) ×1 IMPLANT
SUT STEEL 6MS V (SUTURE) ×6 IMPLANT
SUT STEEL STERNAL CCS#1 18IN (SUTURE) IMPLANT
SUT STEEL SZ 6 DBL 3X14 BALL (SUTURE) IMPLANT
SUT VIC AB 1 CTX 36 (SUTURE) ×6
SUT VIC AB 1 CTX36XBRD ANBCTR (SUTURE) IMPLANT
SUT VIC AB 1 CTX36XBRD ANBCTRL (SUTURE) ×2 IMPLANT
SUT VIC AB 2-0 CT1 27 (SUTURE) ×3
SUT VIC AB 2-0 CT1 TAPERPNT 27 (SUTURE) ×1 IMPLANT
SUT VIC AB 2-0 CTX 27 (SUTURE) IMPLANT
SUT VIC AB 3-0 SH 27 (SUTURE)
SUT VIC AB 3-0 SH 27X BRD (SUTURE) IMPLANT
SUT VIC AB 3-0 X1 27 (SUTURE) ×6 IMPLANT
SUT VICRYL 4-0 PS2 18IN ABS (SUTURE) IMPLANT
SYSTEM SAHARA CHEST DRAIN ATS (WOUND CARE) ×3 IMPLANT
TAPE CLOTH SURG 4X10 WHT LF (GAUZE/BANDAGES/DRESSINGS) ×3 IMPLANT
TAPE PAPER 2X10 WHT MICROPORE (GAUZE/BANDAGES/DRESSINGS) ×3 IMPLANT
TOWEL GREEN STERILE (TOWEL DISPOSABLE) ×3 IMPLANT
TOWEL GREEN STERILE FF (TOWEL DISPOSABLE) ×3 IMPLANT
TRAY CATH LUMEN 1 20CM STRL (SET/KITS/TRAYS/PACK) ×3 IMPLANT
TRAY FOLEY SLVR 16FR TEMP STAT (SET/KITS/TRAYS/PACK) ×3 IMPLANT
TUBING ART PRESS 48 MALE/FEM (TUBING) ×6 IMPLANT
TUBING INSUFFLATION (TUBING) ×3 IMPLANT
UNDERPAD 30X30 (UNDERPADS AND DIAPERS) ×3 IMPLANT
WATER STERILE IRR 1000ML POUR (IV SOLUTION) ×6 IMPLANT

## 2018-10-05 SURGICAL SUPPLY — 17 items
BALLN IABP SENSA PLUS 7.5F 40C (BALLOONS) ×2
BALLOON IABP SENS PLUS 7.5F40C (BALLOONS) ×1 IMPLANT
CATH INFINITI 5FR ANG PIGTAIL (CATHETERS) ×2 IMPLANT
CATH OPTITORQUE TIG 4.0 5F (CATHETERS) ×2 IMPLANT
DEVICE RAD COMP TR BAND LRG (VASCULAR PRODUCTS) ×2 IMPLANT
DEVICE SECURE STATLOCK IABP (MISCELLANEOUS) ×4 IMPLANT
GLIDESHEATH SLEND SS 6F .021 (SHEATH) ×2 IMPLANT
GUIDEWIRE INQWIRE 1.5J.035X260 (WIRE) ×1 IMPLANT
INQWIRE 1.5J .035X260CM (WIRE) ×2
KIT HEART LEFT (KITS) ×2 IMPLANT
PACK CARDIAC CATHETERIZATION (CUSTOM PROCEDURE TRAY) ×2 IMPLANT
SHEATH PINNACLE 6F 10CM (SHEATH) ×2 IMPLANT
SHEATH PINNACLE 7F 10CM (SHEATH) ×2 IMPLANT
SHEATH PROBE COVER 6X72 (BAG) ×2 IMPLANT
SYR MEDRAD MARK 7 150ML (SYRINGE) ×2 IMPLANT
TRANSDUCER W/STOPCOCK (MISCELLANEOUS) ×2 IMPLANT
TUBING CIL FLEX 10 FLL-RA (TUBING) ×2 IMPLANT

## 2018-10-05 NOTE — Anesthesia Procedure Notes (Signed)
Central Venous Catheter Insertion Performed by: Catalina Gravel, MD, anesthesiologist Start/End1/28/2020 8:46 AM, 10/05/2018 8:51 AM Patient location: OR. Preanesthetic checklist: patient identified, IV checked, site marked, risks and benefits discussed, surgical consent, monitors and equipment checked, pre-op evaluation, timeout performed and anesthesia consent Hand hygiene performed  and maximum sterile barriers used  Total catheter length 100. PA cath was placed.Swan type:thermodilution PA Cath depth:48 Procedure performed without using ultrasound guided technique. Attempts: 1 Patient tolerated the procedure well with no immediate complications.

## 2018-10-05 NOTE — ED Triage Notes (Signed)
Patient with vomiting since 8pm last night.  She arrives with chest pain that started two hours ago with the vomiting.  She arrives via EMS, was given 324mg  ASA, 2 sl nitro and 6mg  of morphine for pain.  She has been given 250 ml NS.

## 2018-10-05 NOTE — ED Notes (Signed)
To Cath lab 

## 2018-10-05 NOTE — Transfer of Care (Signed)
Immediate Anesthesia Transfer of Care Note  Patient: Molly Chandler  Procedure(s) Performed: CORONARY ARTERY BYPASS GRAFTING (CABG)x 2,using left internal mammary artery and right leg greater saphenous vein harvested endoscopically (N/A Chest)  Patient Location: SICU  Anesthesia Type:General  Level of Consciousness: sedated  Airway & Oxygen Therapy: Patient remains intubated per anesthesia plan and Patient placed on Ventilator (see vital sign flow sheet for setting)  Post-op Assessment: Report given to RN  Post vital signs: Reviewed and stable  Last Vitals:  Vitals Value Taken Time  BP 113/58 10/05/2018  1:45 PM  Temp 35.8 C 10/05/2018  1:49 PM  Pulse 110 10/05/2018  1:41 PM  Resp 16 10/05/2018  1:49 PM  SpO2 100 % 10/05/2018  1:41 PM  Vitals shown include unvalidated device data.  Last Pain:  Vitals:   10/05/18 0600  TempSrc: Oral  PainSc: 0-No pain         Complications: No apparent anesthesia complications

## 2018-10-05 NOTE — Progress Notes (Signed)
CT surgery p.m. Rounds  Patient sedated on ventilator Intra-aortic balloon pump support cardiac index 2.0 on low-dose dopamine Breath sounds are coarse and Xopenex nebs will be added We have intubated until a.m. rounds

## 2018-10-05 NOTE — OR Nursing (Signed)
12:15 - 45 minute call to SICU charge nurse

## 2018-10-05 NOTE — Progress Notes (Addendum)
Tmax 37.2 Neuro-perll sluggish, reactive. Sedation adequate on 0.7 dex, prn versed/morphine. R: Cl, dim. Slightly coarse right upper. Minimal secretions.  CV: CI improved, last reading 2/ CO 3.5 Attempted to decrease dopamine 2/2 tachycardia to 130's since original C1 was >2. CI decreased to 1.5, MAP decreased, dopa restarted. Albumin given x4. Pacer off, ST to low 100's.  MCT/PCT -20 sxn, -AL 200 sang out. PAD 12-20's. IABP 1:1. No issues.NSR with pvc's to ST low 100's.  Vasc WNL GI: minimal og output GU UOP >52ml/hr K and Mg given, x3 amps bicarb given HGB 10.8 Dressings WNL Family updated

## 2018-10-05 NOTE — Anesthesia Procedure Notes (Addendum)
Central Venous Catheter Insertion Performed by: Catalina Gravel, MD, anesthesiologist Start/End1/28/2020 8:36 AM, 10/05/2018 8:46 AM Patient location: OR. Preanesthetic checklist: patient identified, IV checked, site marked, risks and benefits discussed, surgical consent, monitors and equipment checked, pre-op evaluation, timeout performed and anesthesia consent Lidocaine 1% used for infiltration and patient sedated Hand hygiene performed  and maximum sterile barriers used  Catheter size: 9 Fr Central line was placed.MAC introducer Procedure performed using ultrasound guided technique. Ultrasound Notes:anatomy identified, needle tip was noted to be adjacent to the nerve/plexus identified, no ultrasound evidence of intravascular and/or intraneural injection and image(s) printed for medical record Attempts: 1 Following insertion, line sutured, dressing applied and Biopatch. Post procedure assessment: blood return through all ports, free fluid flow and no air  Patient tolerated the procedure well with no immediate complications.

## 2018-10-05 NOTE — Progress Notes (Signed)
ANTICOAGULATION CONSULT NOTE - Initial Consult  Pharmacy Consult for heparin Indication: IABP  Allergies  Allergen Reactions  . Amoxicillin Diarrhea and Nausea And Vomiting    GI intolerance.   . Metoclopramide Hcl Other (See Comments)    Stroke-like symptoms (dose too high?)    Patient Measurements: Height: 4' 11.5" (151.1 cm) Weight: 167 lb (75.8 kg) IBW/kg (Calculated) : 44.35  Heparin dosing weight: 65kg  Vital Signs: BP: 135/67 (01/28 0520) Pulse Rate: 98 (01/28 0520)  Labs: Recent Labs    10/05/18 0334  APTT 88*  LABPROT 13.9  INR 1.08  CREATININE 1.00  TROPONINI 0.05*    Estimated Creatinine Clearance: 39.7 mL/min (by C-G formula based on SCr of 1 mg/dL).   Medical History: Past Medical History:  Diagnosis Date  . Cataract   . Lymphoma ([place])    rectal cancer  . Osteoporosis    osteopenia    Assessment: 84yo female c/o vomiting since 8p last night followed by CP that began after MN >> called as code STEMI and sent emergently to cath lab, now w/ IABP placed while awaiting surgical consult and to start heparin.  Goal of Therapy:  Heparin level 0.2-0.5 units/ml Monitor platelets by anticoagulation protocol: Yes   Plan:  Rec'd heparin 4000 units IV bolus prior to cath; will start heparin gtt at 800 units/hr and monitor heparin levels and CBC.  Wynona Neat, PharmD, BCPS  10/05/2018,5:33 AM

## 2018-10-05 NOTE — Consult Note (Signed)
[address]       [place] [address]             [phone]          CARDIOTHORACIC SURGERY CONSULTATION REPORT  PCP is Baity, Coralie Keens, NP Referring Provider is Gaylord Shih, MD  Reason for consultation:  Left main coronary artery disease s/p acute STEMI  HPI:  Patient is an 84 year old female with no previous cardiac history who has been referred for emergent surgical consultation for assistance with management of left main coronary artery disease status post acute ST segment elevation myocardial infarction.  Patient has no previous cardiac history and no significant cardiac risk factors.  She has a remote history of non-Hodgkin's lymphoma for which she has been in remission for more than 20 years.  The patient has remained reasonably active physically and entirely functionally independent.  She was in her usual state of health until yesterday evening when she initially developed epigastric discomfort associated with nausea vomiting and diarrhea beginning around 8 PM yesterday evening.  The patient and her daughter were initially concerned that she might have contracted a viral illness.  Symptoms initially improved and the patient went to bed, but just before midnight she developed more acute substernal chest pain.  EMS was activated and baseline EKG revealed diffuse ST segment elevation associated with hypotension in the field.  Code STEMI was activated and the patient brought directly to the cardiac Cath Lab where catheterization revealed critical stenosis of the distal left main coronary artery.  Following systemic anticoagulation using heparin the patient's symptoms improved.  Intra-aortic balloon pump was placed.  The patient's blood pressure normalized.  Emergent cardiac surgical consultation was requested.  The patient is currently sedated but arousable in the Cath Lab.  She denies ongoing chest pain or shortness of breath.  She is not currently able to  provide full review of systems, and the remainder is provided by her daughter.  The patient is widowed and lives with her daughter and son-in-law.  She drives an automobile and remains functionally independent.  She does get tired with physical exertion but according to her daughter she has remained reasonably active for her age.  Her functional capacity and exercise tolerance has not decreased significantly.  The patient's daughter does not recall the patient complaining of problems with exertional shortness of breath or other signs of significant heart failure prior to her acute presentation this evening.  Past Medical History:  Diagnosis Date  . Cataract   . Lymphoma ([place])    rectal cancer  . Osteoporosis    osteopenia    History reviewed. No pertinent surgical history.  No family history on file.  Social History   Socioeconomic History  . Marital status: Married    Spouse name: Not on file  . Number of children: 5  . Years of education: Not on file  . Highest education level: Not on file  Occupational History  . Occupation: Retired    Fish farm manager: RETIRED  Social Needs  . Financial resource strain: Not on file  . Food insecurity:    Worry: Not on file    Inability: Not on file  . Transportation needs:    Medical: Not on file    Non-medical: Not on file  Tobacco Use  . Smoking status: Former Research scientist (life sciences)  . Smokeless tobacco: Never Used  Substance and Sexual Activity  . Alcohol use: No  . Drug use: Not on file  .  Sexual activity: Not on file  Lifestyle  . Physical activity:    Days per week: Not on file    Minutes per session: Not on file  . Stress: Not on file  Relationships  . Social connections:    Talks on phone: Not on file    Gets together: Not on file    Attends religious service: Not on file    Active member of club or organization: Not on file    Attends meetings of clubs or organizations: Not on file    Relationship status: Not on file  . Intimate partner  violence:    Fear of current or ex partner: Not on file    Emotionally abused: Not on file    Physically abused: Not on file    Forced sexual activity: Not on file  Other Topics Concern  . Not on file  Social History Narrative   Desires CPR   Would not want life support prolonged if futile    Prior to Admission medications   Medication Sig Start Date End Date Taking? Authorizing Provider  Ascorbic Acid (VITAMIN C PO) Take by mouth.    [provider]  Cholecalciferol (VITAMIN D PO) Take by mouth.    [provider]  Multiple Minerals (CALCIUM-MAGNESIUM-ZINC) TABS Take 1 tablet by mouth at bedtime.    [provider]  Multiple Vitamin (MULTIVITAMIN) tablet Take 1 tablet by mouth daily.    [provider]  omeprazole (PRILOSEC) 40 MG capsule TAKE ONE (1) CAPSULE BY MOUTH EACH DAY 20 MINUTES BEFORE BREAKFAST. 01/01/18   [provider]  Probiotic Product (PROBIOTIC DAILY PO) Take by mouth.    [provider]  Travoprost, BAK Free, (TRAVATAMN) 0.004 % SOLN ophthalmic solution Place 1 drop into both eyes at bedtime.      [provider]  VITAMIN E PO Take by mouth.    [provider]    Current Facility-Administered Medications  Medication Dose Route Frequency Provider Last Rate Last Dose  . 0.9 %  sodium chloride infusion   Intravenous Continuous Ward, Kristen N, DO 10 mL/hr at 10/05/18 0304    . 0.9 %  sodium chloride infusion    Continuous PRN Leonie Man, MD 10 mL/hr at 10/05/18 0346 10 mL/hr at 10/05/18 0346  . acetaminophen (TYLENOL) tablet 650 mg  650 mg Oral Q4H PRN Milus Banister, MD      . atorvastatin (LIPITOR) tablet 80 mg  80 mg Oral q1800 Milus Banister, MD      . fentaNYL (SUBLIMAZE) injection    PRN Leonie Man, MD   25 mcg at 10/05/18 0429  . Heparin (Porcine) in NaCl 1000-0.9 UT/500ML-% SOLN    PRN Leonie Man, MD   500 mL at 10/05/18 0424  . heparin injection    PRN Leonie Man,  MD   4,000 Units at 10/05/18 [phone]  . iohexol (OMNIPAQUE) 350 MG/ML injection    PRN Leonie Man, MD   85 mL at 10/05/18 0456  . lidocaine (PF) (XYLOCAINE) 1 % injection    PRN Leonie Man, MD   10 mL at 10/05/18 0342  . midazolam (VERSED) injection    PRN Leonie Man, MD   0.5 mg at 10/05/18 0429  . norepinephrine (LEVOPHED) 4 mg in dextrose 5 % 250 mL (0.016 mg/mL) infusion    Continuous PRN Leonie Man, MD   Stopped at 10/05/18 [phone]  . pantoprazole (PROTONIX) injection  40 mg  40 mg Intravenous Q24H Milus Banister, MD      . Radial Cocktail/Verapamil only    PRN Leonie Man, MD   10 mL at 10/05/18 4098    Allergies  Allergen Reactions  . Amoxicillin Diarrhea and Nausea And Vomiting    GI intolerance.   . Metoclopramide Hcl Other (See Comments)    Stroke-like symptoms (dose too high?)      Review of Systems:  Per HPI, remainder non-contributory     Physical Exam:   BP (!) 108/53   Pulse (!) 57   Resp (!) 25   Ht 4' 11.5" (1.511 m)   Wt 75.8 kg   SpO2 98%   BMI 33.17 kg/m   General:  Mildly obese female in NAD, breathing comfortably  HEENT:  Unremarkable   Neck:   no JVD, no bruits, no adenopathy   Chest:   clear to auscultation, symmetrical breath sounds, no wheezes, no rhonchi   CV:   RRR, no  murmur   Abdomen:  soft, non-tender, no masses   Extremities:  warm, well-perfused, pulses diminished, no lower extremity edema  Rectal/GU  Deferred  Neuro:   Grossly non-focal and symmetrical throughout  Skin:   Clean and dry, no rashes, no breakdown  Diagnostic Tests:  Lab Results: No results for input(s): WBC, HGB, HCT, PLT in the last 72 hours. BMET: No results for input(s): NA, K, CL, CO2, GLUCOSE, BUN, CREATININE, CALCIUM in the last 72 hours.  CBG (last 3)  No results for input(s): GLUCAP in the last 72 hours. PT/INR:  No results for input(s): LABPROT, INR in the last 72 hours.  CXR:  N/A   Coronary/Graft Acute MI Revascularization   IABP Insertion  LEFT HEART CATH AND CORONARY ANGIOGRAPHY  Conclusion     CULPRIT LESION: Dist LM to Ost LAD lesion is 95% stenosed. Ost Cx lesion is 50% stenosed.  There is moderate to severe left ventricular systolic dysfunction. The left ventricular ejection fraction is 25-35% by visual estimate -dense anterior wall motion and normality  LV end diastolic pressure is severely elevated.  There is no aortic valve stenosis. There is mild (2+) mitral regurgitation.   SUMMARY   Severe distal left main-ostial LAD 95% stenosis involving ostium of LCx 50%.  Moderate severely reduced LVEF with diffuse anterior hypokinesis and severely elevated LVEDP  Borderline cardiogenic shock, likely related to nitroglycerin administration, resolved after brief course of IV Levophed.  ACUTE COMBINED SYSTOLIC AND DIASTOLIC HEART FAILURE from acute ischemic cardiomyopathy    RECOMMENDATIONS  Based on location of stenosis, best course of action is for CABG.  After discussion with Dr. Roxy Manns, the plan will be to stabilize her temporarily in the CVICU to allow time for the OR staff arrival.  She will then be taken for emergent CABG with IABP pump in place.  Echocardiogram is been ordered for postop  High-dose statin ordered.  Titration medications postop as tolerated.   Glenetta Hew, M.D., M.S. Interventional Cardiologist   Pager # [phone] Phone # [phone] [address] [place], [place] [address]    Recommendations   Antiplatelet/Anticoag Recommend uninterrupted dual antiplatelet therapy with Aspirin 3m daily and Clopidogrel 776mdaily for a minimum of 12 months (ACS-Class I recommendation). Recommend starting Plavix prior to discharge.  Indications   Acute ST elevation myocardial infarction (STEMI) involving left main coronary artery (HCC) [I21.01 (ICD-10-CM)]  Procedural Details   Technical Details PCP: BaJearld FentonNP CARDIOLOGIST: [facility]  Ellyn Hack). Consulting CT Surgeon: Dr. Darylene Price  84 year old relatively functional woman who lives with her daughter and son-in-law. Was moving firewood earlier today, was in her usual state of health until she was awakened early this morning with acute dyspnea and substernal chest pressure. When she was initially evaluated by her daughter, she is borderline responsive. EMS was called and huge 6 to 8 mm anterior ST elevations with depressions inferiorly were noted. Code STEMI was called. She did receive 2 nitroglycerin sublingual in route and her blood pressure was relatively low upon arrival having initially been in the 1 teens was in the 80s upon arrival. She was arousable but somnolent upon initial arrival and in notable discomfort. After initial stabilization in the ER she was brought to the cardiac catheterization lab emergently prior to having extra IV lines or blood drawn.  Time Out: Verified patient identification, verified procedure, site/side was marked, verified correct patient position, special equipment/implants available, medications/allergies/relevent history reviewed, required imaging and test results available. Performed. Consent Signed.   Access:  * RIGHT Radial Artery: 6 Fr sheath -- Seldinger technique using Angiocath Micropuncture Kit -- Direct ultrasound guidance used. Permanent image obtained and placed on chart. -- 5 mL radial cocktail IA; 4000 Units IV Heparin *RIGHT Common Femoral Vein: 7 Fr Sheath - Seldinger Technique. -- Direct ultrasound guidance used. Permanent image obtained and placed on chart.  Left Heart Catheterization: 5 Fr TIG 4.0 Catheter was advanced or exchanged over a J-wire under direct fluoroscopic guidance into the ascending aorta; the catheter was advanced across the Aortic Valve first for management of hemodynamics and brief LV gram.. . * LV Hemodynamics (LV Gram): TIG 4.0 catheter * Left & Right Coronary Artery Cineangiography: TIG 4.0 catheter  -  After completion of angiography, the catheter was removed completely out of the body over wire without complication.  After initial angiography revealed SEVERE DISTAL LEFT MAIN-OSTIAL LAD disease, decision was made to place IABP while awaiting. * RIGHT Common Femoral Artery: 6 Fr Sheath - fluoroscopically guided modified Seldinger Technique --Using an angled pigtail catheter, and aortoiliac angiogram was performed to determine the size of the femoral arteries to decide for balloon pump versus Impella. I felt like the femoral artery size is relatively small, and based on the fact that her pressures were somewhat stabilized I chose to use IABP to stabilize prior to CABG. (If PCI was be considered, would have used Impella)  Radial and femoral sheath sutured in place. Radial sheath will be removed with TR band placed following surgery. Defer removal of IABP to CV surgery.  MEDICATIONS * SQ Lidocaine 17m * Radial Cocktail: 3 mg Verapamil in 10 mL NS * Isovue Contrast: 85 mL * Heparin: total ~7000 Units * IV Levophed - started @ 10 mcg/kg/min -> weaned off after IABP * Cangreal Bolus  Fluoro time: 3.9 minutes. Dose Area Product: 86967mGycm2. Cumulative Air Kerma: 135 mGy.   Estimated blood loss <50 mL.   During this procedure medications were administered to achieve and maintain moderate conscious sedation while the patient's heart rate, blood pressure, and oxygen saturation were continuously monitored and I was present face-to-face 100% of this time.  Medications  (Filter: Administrations occurring from 10/05/18 0307 to 10/05/18 0545)  Medication Rate/Dose/Volume Action  Date Time   lidocaine (PF) (XYLOCAINE) 1 % injection (mL) 15 mL Given 10/05/18 0324   Total dose as of 10/05/18 0639 5 mL Given 0327   30 mL 10 mL Given 0342   Radial Cocktail/Verapamil  only (mL) 10 mL Given 10/05/18 0328   Total dose as of 10/05/18 0639        10 mL        fentaNYL (SUBLIMAZE) injection (mcg) 25 mcg Given  10/05/18 0346   Total dose as of 10/05/18 0639 25 mcg Given 0413   75 mcg 25 mcg Given 0429   0.9 % sodium chloride infusion (mL/hr) 10 mL/hr New Bag/Given 10/05/18 0346   Dosing weight:  75.8 kg        Total dose as of 10/05/18 0639        Cannot be calculated        heparin injection (Units) 3,000 Units Given 10/05/18 0346   Total dose as of 10/05/18 0639 4,000 Units Given 0415   7,000 Units        norepinephrine (LEVOPHED) 4 mg in dextrose 5 % 250 mL (0.016 mg/mL) infusion (mcg/min) 10 mcg/min - 37.5 mL/hr New Bag/Given 10/05/18 0334   Dosing weight:  75.8 kg 5 mcg/min - 18.75 mL/hr Rate/Dose Change 0403   Total dose as of 10/05/18 0639  Stopped 0436   457.33 mcg        Heparin (Porcine) in NaCl 1000-0.9 UT/500ML-% SOLN (mL) 500 mL Given 10/05/18 0424   Total dose as of 10/05/18 0639 500 mL Given 0424   2,000 mL 500 mL Given 0424    500 mL Given 0424   midazolam (VERSED) injection (mg) 0.5 mg Given 10/05/18 0429   Total dose as of 10/05/18 0639        0.5 mg        iohexol (OMNIPAQUE) 350 MG/ML injection (mL) 85 mL Given 10/05/18 0456   Total dose as of 10/05/18 0639        85 mL        cangrelor (KENGREAL) bolus via infusion (mcg/kg) 2,274 mcg Given 10/05/18 0455   Dosing weight:  75.8 kg        Total dose as of 10/05/18 0639        2,274 mcg        Sedation Time   Sedation Time Physician-1: 12 minutes 13 seconds  Complications   Complications documented before study signed (10/05/2018 5:55 AM EST)    No complications were associated with this study.  Documented by Leonie Man, MD - 10/05/2018 5:24 AM EST    Coronary Findings   Diagnostic  Dominance: Right  Left Main  Vessel is large.  Dist LM to Ost LAD lesion 95% stenosed  Dist LM to Ost LAD lesion is 95% stenosed. Vessel is the culprit lesion. The lesion is located at the bifurcation, eccentric, irregular and ulcerative. Hazy appearing  Left Anterior Descending  TIMI 2 flow to apex  First Septal Branch   Vessel is small in size.  Third Diagonal Branch  Vessel is small in size.  Left Circumflex  Vessel is large.  Ost Cx lesion 50% stenosed  Ost Cx lesion is 50% stenosed. The lesion is focal.  Prox Cx to Mid Cx lesion 20% stenosed  Prox Cx to Mid Cx lesion is 20% stenosed. The lesion is eccentric.  First Obtuse Marginal Branch  Vessel is moderate in size.  Second Obtuse Marginal Branch  Vessel is large in size.  Left Atrioventricular Groove Continuation  Vessel is small in size.  Right Coronary Artery  Prox RCA lesion 20% stenosed  Prox RCA lesion is 20% stenosed. The lesion is eccentric.  Acute Marginal Branch  Vessel is  small in size.  Inferior Septal  Vessel is small in size.  Right Posterior Atrioventricular Branch  Vessel is large in size.  First Right Posterolateral  Vessel is small in size.  Second Right Posterolateral  Vessel is small in size.  Intervention   No interventions have been documented.  Impella/IABP   Hemodynamic Support Bridging for CABG Access site: right femoral artery. Right common femoral artery access: 6 French sheath exchanged over wire for 7.5 French IABP sheath. IABP advanced over wire to the aortic knob. Position confirmed. Lines flushed. Started at 1: 1. Sutured in place with stabilizing clips.  Wall Motion   Resting       Severe hypokinesis          Left Heart   Left Ventricle The left ventricular size is in the upper limits of normal. There is moderate to severe left ventricular systolic dysfunction. LV end diastolic pressure is severely elevated. The left ventricular ejection fraction is 25-35% by visual estimate. There are LV function abnormalities due to segmental dysfunction.  Mitral Valve There is mild (2+) mitral regurgitation.  Aortic Valve There is no aortic valve stenosis. The aortic valve is calcified.  Coronary Diagrams   Diagnostic  Dominance: Right    Intervention   Implants    No implant documentation for this  case.  Syngo Images   Show images for CARDIAC CATHETERIZATION  MERGE Images   Show images for CARDIAC CATHETERIZATION   Link to Procedure Log   Procedure Log    Hemo Data    Most Recent Value  AO Systolic Pressure 409 mmHg  AO Diastolic Pressure 32 mmHg  AO Mean 87 mmHg  LV Systolic Pressure 92 mmHg  LV Diastolic Pressure 15 mmHg  LV EDP 27 mmHg  AOp Systolic Pressure 811 mmHg  AOp Diastolic Pressure 65 mmHg  AOp Mean Pressure 84 mmHg  LVp Systolic Pressure 99 mmHg  LVp Diastolic Pressure 19 mmHg  LVp EDP Pressure 27 mmHg      Impression:  I have personally examined the patient, reviewed the patient's chart and past medical history, and reviewed the patient's diagnostic cardiac catheterization at the bedside with Dr. Ellyn Hack in the Cath Lab.  The patient has critical 99% distal left main coronary artery stenosis.  She has right dominant coronary circulation and otherwise only minimal nonobstructive coronary artery disease.  She appears to have good target vessels for grafting.  She has stabilized with no ongoing signs of ischemia at present, maintaining sinus rhythm with normal blood pressure with intra-aortic balloon pump in place.  Options include emergent surgical revascularization versus high risk PCI and stenting.   Despite the patient's advanced age I favor surgical revascularization given her otherwise relatively good health and functional status prior to her acute presentation.   Plan:  The patient's daughter has been counseled regarding recurrent acute presentation and all associated treatment options.  She understands the emergent circumstances and the need for prompt coronary revascularization.  We discussed the advantages of surgical revascularization as well as all associated risks and expectations for the patient's postoperative convalescence.  We discussed the advantages of PCI and stenting with all associated concerns regarding procedural risk and long-term  durability.  All questions have been answered.  We plan to proceed with emergent coronary artery bypass grafting this morning. The patient's daughter understands and accepts all potential associated risks of surgery including but not limited to risk of death, stroke or other neurologic complication, myocardial infarction, congestive heart failure, respiratory failure,  renal failure, bleeding requiring blood transfusion and/or reexploration, aortic dissection or other major vascular complication, arrhythmia, heart block or bradycardia requiring permanent pacemaker, pneumonia, pleural effusion, wound infection, pulmonary embolus or other thromboembolic complication, chronic pain or other delayed complications related to median sternotomy, or the late recurrence of symptomatic ischemic heart disease and/or congestive heart failure.  The importance of long term risk modification have been emphasized.  All questions answered.    I spent in excess of 60 minutes during the conduct of this hospital consultation and >50% of this time involved direct face-to-face encounter for counseling and/or coordination of the patient's care.    Valentina Gu. Roxy Manns, MD 10/05/2018 5:09 AM

## 2018-10-05 NOTE — ED Provider Notes (Signed)
TIME SEEN: 3:06 AM  CHIEF COMPLAINT: Code STEMI  HPI: Patient is an 84 year old female with no significant past medical history per her report who started vomiting last night and having chest pain 1 hour prior to arrival.  Patient appears to have anterior lateral STEMI on EKG with EMS.  Was given 324 mg of aspirin, 2 nitroglycerin tablets, 6 mg of morphine and 4 mg of Zofran prior to arrival.  Patient still having pain.  Does not have a cardiologist.  Denies history of previous catheterization.  Denies history of hypertension, diabetes, hyperlipidemia, tobacco use.  ROS: Level 5 caveat due to acuity  PAST MEDICAL HISTORY/PAST SURGICAL HISTORY:  Past Medical History:  Diagnosis Date  . Cataract   . Lymphoma ([place])    rectal cancer  . Osteoporosis    osteopenia    MEDICATIONS:  Prior to Admission medications   Medication Sig Start Date End Date Taking? Authorizing Provider  Ascorbic Acid (VITAMIN C PO) Take by mouth.    [provider]  Cholecalciferol (VITAMIN D PO) Take by mouth.    [provider]  Multiple Minerals (CALCIUM-MAGNESIUM-ZINC) TABS Take 1 tablet by mouth at bedtime.    [provider]  Multiple Vitamin (MULTIVITAMIN) tablet Take 1 tablet by mouth daily.    [provider]  omeprazole (PRILOSEC) 40 MG capsule TAKE ONE (1) CAPSULE BY MOUTH EACH DAY 20 MINUTES BEFORE BREAKFAST. 01/01/18   [provider]  Probiotic Product (PROBIOTIC DAILY PO) Take by mouth.    [provider]  Travoprost, BAK Free, (TRAVATAMN) 0.004 % SOLN ophthalmic solution Place 1 drop into both eyes at bedtime.      [provider]  VITAMIN E PO Take by mouth.    [provider]    ALLERGIES:  Allergies  Allergen Reactions  . Amoxicillin Diarrhea and Nausea And Vomiting    GI intolerance.   . Metoclopramide Hcl Other (See Comments)    Stroke-like symptoms (dose too high?)    SOCIAL HISTORY:  Social History   Tobacco Use   . Smoking status: Former Research scientist (life sciences)  . Smokeless tobacco: Never Used  Substance Use Topics  . Alcohol use: No    FAMILY HISTORY: No family history on file.  EXAM: Ht 4' 11.5" (1.511 m)   Wt 75.8 kg   BMI 33.17 kg/m  Please see nursing notes for vital signs CONSTITUTIONAL: Alert and oriented and responds appropriately to questions.  Really, appears uncomfortable, drowsy HEAD: Normocephalic EYES: Conjunctivae clear, pupils appear equal, EOMI ENT: normal nose; moist mucous membranes NECK: Supple, no meningismus, no nuchal rigidity, no LAD  CARD: RRR; S1 and S2 appreciated; no murmurs, no clicks, no rubs, no gallops RESP: Normal chest excursion without splinting or tachypnea; breath sounds clear and equal bilaterally; no wheezes, no rhonchi, no rales, no hypoxia or respiratory distress, speaking full sentences ABD/GI: Normal bowel sounds; non-distended; soft, non-tender, no rebound, no guarding, no peritoneal signs, no hepatosplenomegaly BACK:  The back appears normal and is non-tender to palpation, there is no CVA tenderness EXT: Normal ROM in all joints; non-tender to palpation; no edema; normal capillary refill; no cyanosis, no calf tenderness or swelling, palpable radial and DP pulses bilaterally    SKIN: Normal color for age and race; warm; no rash NEURO: Moves all extremities equally PSYCH: The patient's mood and manner are appropriate. Grooming and personal hygiene are appropriate.  MEDICAL DECISION MAKING: Patient here as a code STEMI.  Appears to be having anterior lateral MI with  reciprocal changes.  Cardiology at bedside upon patient's arrival.  Cath Lab ready within minutes on patient's arrival.  Patient given heparin prior to going upstairs to the Cath Lab.  Heart rate was in the 60s.  Blood pressure 110/60s.  Blood sugar was normal with EMS.  Family at bedside updated.    EKG Interpretation  Date/Time:  Tuesday October 05 2018 03:01:04 EST Ventricular Rate:  61 PR  Interval:    QRS Duration: 134 QT Interval:  448 QTC Calculation: 452 R Axis:   -57 Text Interpretation:  Sinus rhythm Short PR interval RBBB and LAFB Probable left ventricular hypertrophy Lateral infarct, acute (LAD) new compared to prior >>> Acute MI <<< Confirmed by Pryor Curia [phone]) on 10/05/2018 3:21:23 AM        CRITICAL CARE Performed by: Cyril Mourning Samiya Mervin   Total critical care time: 35 minutes  Critical care time was exclusive of separately billable procedures and treating other patients.  Critical care was necessary to treat or prevent imminent or life-threatening deterioration.  Critical care was time spent personally by me on the following activities: development of treatment plan with patient and/or surrogate as well as nursing, discussions with consultants, evaluation of patient's response to treatment, examination of patient, obtaining history from patient or surrogate, ordering and performing treatments and interventions, ordering and review of laboratory studies, ordering and review of radiographic studies, pulse oximetry and re-evaluation of patient's condition.      Scotty Pinder, Delice Bison, DO 10/05/18 [phone]

## 2018-10-05 NOTE — Anesthesia Procedure Notes (Addendum)
Procedure Name: Intubation Date/Time: 10/05/2018 8:28 AM Performed by: Carney Living, CRNA Pre-anesthesia Checklist: Patient identified, Emergency Drugs available, Suction available and Patient being monitored Patient Re-evaluated:Patient Re-evaluated prior to induction Oxygen Delivery Method: Circle System Utilized Preoxygenation: Pre-oxygenation with 100% oxygen Induction Type: IV induction Ventilation: Mask ventilation without difficulty and Oral airway inserted - appropriate to patient size Laryngoscope Size: Mac and 3 Grade View: Grade I Tube type: Oral Number of attempts: 1 Airway Equipment and Method: Stylet and Oral airway Placement Confirmation: ETT inserted through vocal cords under direct vision,  positive ETCO2 and breath sounds checked- equal and bilateral Secured at: 20 cm Tube secured with: Tape Dental Injury: Teeth and Oropharynx as per pre-operative assessment  Comments: Intubation performed by Dorthea Cove, SRNA

## 2018-10-05 NOTE — Progress Notes (Signed)
TCTS:  I have reviewed the medical record and cardiac catheterization films and examined patient.  She has high-grade left main and ostial LAD stenosis with severe left ventricular dysfunction status post acute ST segment elevation MI.  She has stabilized with insertion of an intra-aortic balloon pump.  I agree that emergent coronary bypass graft surgery is the best treatment to prevent further ischemia, infarction, and death. I discussed the operative procedure with the patient and family including alternatives, benefits and risks; including but not limited to bleeding, blood transfusion, infection, stroke, myocardial infarction, graft failure, heart block requiring a permanent pacemaker, organ dysfunction, and death.  Molly Chandler understands and agrees to proceed.

## 2018-10-05 NOTE — H&P (Signed)
Cardiology Admission History and Physical:   Patient ID: Molly Chandler MRN: [id number]; DOB: 1935-03-19   Admission date: 10/05/2018  Primary Care Provider: Lorre Munroe, NP Primary Cardiologist: No primary care provider on file.  Primary Electrophysiologist:  None   Chief Complaint:  Chest pain  Patient Profile:   Molly Chandler is a 84 y.o. female who was BIBEMS found to have an anterior STEMI.  History of Present Illness:   According to patient's family, patient was previously healthy, functional, independent - even moving wood on the porch as early as this morning - until earlier on the day of presentation when she developed epigastric discomfort with N/V around 8PM. She subsequently developed SSCP which prompted activation of EMS.   Code STEMI was activated in the field when STE were noted in V2, V3, I, and AVL in the field associated with hypotension (BP 88/50). Upon arrival to the ER, patient altered, hypotensive, complaining ongoing CP.   She was taken emergently to the cath lab, where she was found to have a 99% dLAD bifurcation disease involving the ostium of the LAD and LCx. Initial LVEDP during the LHC was and she required 10 of levophed to maintain MAP > 65. Given tenuous hemodynamics and site of disease, decision made to place an IABP and CT surgery consulted.   In the cath lab, cangrelor bolus (only no gtt) given.   Plan for emergent CAGB with Dr. Cornelius Moras this morning.   Past Medical History:  Diagnosis Date  . Cataract   . Lymphoma ([facility])    rectal cancer  . Osteoporosis    osteopenia    History reviewed. No pertinent surgical history.   Medications Prior to Admission: Prior to Admission medications   Medication Sig Start Date End Date Taking? Authorizing Provider  Ascorbic Acid (VITAMIN C PO) Take by mouth.    [provider]  Cholecalciferol (VITAMIN D PO) Take by mouth.    [provider]  Multiple Minerals  (CALCIUM-MAGNESIUM-ZINC) TABS Take 1 tablet by mouth at bedtime.    [provider]  Multiple Vitamin (MULTIVITAMIN) tablet Take 1 tablet by mouth daily.    [provider]  omeprazole (PRILOSEC) 40 MG capsule TAKE ONE (1) CAPSULE BY MOUTH EACH DAY 20 MINUTES BEFORE BREAKFAST. 01/01/18   [provider]  Probiotic Product (PROBIOTIC DAILY PO) Take by mouth.    [provider]  Travoprost, BAK Free, (TRAVATAMN) 0.004 % SOLN ophthalmic solution Place 1 drop into both eyes at bedtime.      [provider]  VITAMIN E PO Take by mouth.    [provider]     Allergies:    Allergies  Allergen Reactions  . Amoxicillin Diarrhea and Nausea And Vomiting    GI intolerance.   . Metoclopramide Hcl Other (See Comments)    Stroke-like symptoms (dose too high?)    Social History:   Social History   Socioeconomic History  . Marital status: Married    Spouse name: Not on file  . Number of children: 5  . Years of education: Not on file  . Highest education level: Not on file  Occupational History  . Occupation: Retired    Associate Professor: RETIRED  Social Needs  . Financial resource strain: Not on file  . Food insecurity:    Worry: Not on file    Inability: Not on file  . Transportation needs:    Medical: Not on file    Non-medical: Not on file  Tobacco Use  . Smoking status: Former Games developer  . Smokeless tobacco: Never Used  Substance and Sexual Activity  . Alcohol use: No  . Drug use: Not on file  . Sexual activity: Not on file  Lifestyle  . Physical activity:    Days per week: Not on file    Minutes per session: Not on file  . Stress: Not on file  Relationships  . Social connections:    Talks on phone: Not on file    Gets together: Not on file    Attends religious service: Not on file    Active member of club or organization: Not on file    Attends meetings of clubs or organizations: Not on file    Relationship status: Not on file  .  Intimate partner violence:    Fear of current or ex partner: Not on file    Emotionally abused: Not on file    Physically abused: Not on file    Forced sexual activity: Not on file  Other Topics Concern  . Not on file  Social History Narrative   Desires CPR   Would not want life support prolonged if futile    Family History:   The patient's family history is not on file.    Review of Systems: [y] = yes, [ ]  = no     General: Weight gain [ ] ; Weight loss [ ] ; Anorexia [ ] ; Fatigue [ ] ; Fever [ ] ; Chills [ ] ; Weakness [ ]    Cardiac: Chest pain/pressure Cove.Etienne ]; Resting SOB [ ] ; Exertional SOB [ ] ; Orthopnea [ ] ; Pedal Edema [ ] ; Palpitations [ ] ; Syncope [ ] ; Presyncope [ ] ; Paroxysmal nocturnal dyspnea[ ]    Pulmonary: Cough [ ] ; Wheezing[ ] ; Hemoptysis[ ] ; Sputum [ ] ; Snoring [ ]    GI: Vomiting[y ]; Dysphagia[ ] ; Melena[ ] ; Hematochezia [ ] ; Heartburn[ ] ; Abdominal pain [ ] ; Constipation [ ] ; Diarrhea Cove.Etienne ]; BRBPR [ ]    GU: Hematuria[ ] ; Dysuria [ ] ; Nocturia[ ]    Vascular: Pain in legs with walking [ ] ; Pain in feet with lying flat [ ] ; Non-healing sores [ ] ; Stroke [ ] ; TIA [ ] ; Slurred speech [ ] ;   Neuro: Headaches[ ] ; Vertigo[ ] ; Seizures[ ] ; Paresthesias[ ] ;Blurred vision [ ] ; Diplopia [ ] ; Vision changes [ ]    Ortho/Skin: Arthritis [ ] ; Joint pain [ ] ; Muscle pain [ ] ; Joint swelling [ ] ; Back Pain [ ] ; Rash [ ]    Psych: Depression[ ] ; Anxiety[ ]    Heme: Bleeding problems [ ] ; Clotting disorders [ ] ; Anemia [ ]    Endocrine: Diabetes [ ] ; Thyroid dysfunction[ ]   Physical Exam/Data:   Vitals:   10/05/18 0300 10/05/18 0301 10/05/18 0322 10/05/18 0409  BP: 117/62   (!) 108/53  Pulse: (!) 57     Resp: (!) 25     SpO2: 100%  98%   Weight: 80.5 kg 75.8 kg    Height: 4' 11.5" (1.511 m)      No intake or output data in the 24 hours ending 10/05/18 0442 Filed Weights   10/05/18 0300 10/05/18 0301  Weight: 80.5 kg 75.8 kg   Body mass index is 33.17 kg/m.  (seen an  examined prior to cath) General: elderly pale woman, in distress.  HEENT: normal Lymph: no adenopathy Neck: no JVD.  Endocrine:  No thryomegaly Vascular: 1+, thready radial pulses.  Cardiac:  Distant heart sounds, tachycardic. No murmurs.  Lungs:  Lungs clear anteriorly.  Abd:  soft, nontender, no hepatomegaly  Ext: cool extremities. No edema.  Musculoskeletal:  No deformities, BUE and BLE strength normal and equal Skin: warm and dry  Neuro:  Lethargic, altered, opening eyes only to voice, moaning.  Psych:  Normal affect   EKG:  The ECG that was done demonstrated STE in V2, V3, I, AVL with deep depression in inferior leads.   Relevant CV Studies: n/a  Laboratory Data:  ChemistryNo results for input(s): NA, K, CL, CO2, GLUCOSE, BUN, CREATININE, CALCIUM, GFRNONAA, GFRAA, ANIONGAP in the last 168 hours.  No results for input(s): PROT, ALBUMIN, AST, ALT, ALKPHOS, BILITOT in the last 168 hours. HematologyNo results for input(s): WBC, RBC, HGB, HCT, MCV, MCH, MCHC, RDW, PLT in the last 168 hours. Cardiac EnzymesNo results for input(s): TROPONINI in the last 168 hours. No results for input(s): TROPIPOC in the last 168 hours.  BNPNo results for input(s): BNP, PROBNP in the last 168 hours.  DDimer No results for input(s): DDIMER in the last 168 hours.  Radiology/Studies:  No results found.  Assessment and Plan:   63F who presented with N/V/CP found to have anterior STEMI with distal LM/bifurcation disease and SCAI C cardiogenic shock now scheduled for emergent CABG.   -- To OR for CABG this AM -- EKG post-cath.  -- s/p Cangrelor bolus; will not continue gtt pending OR -- s/p IABP -heparin on hold given recent cath, pending OR -- TTE post-operatively.  -- lipid profile and a1c for risk stratification.  -- Levophed weaned off; will check lactate, VBG  Appreciate CT surgery consulting.   Severity of Illness: The appropriate patient status for this patient is INPATIENT. Inpatient  status is judged to be reasonable and necessary in order to provide the required intensity of service to ensure the patient's safety. The patient's presenting symptoms, physical exam findings, and initial radiographic and laboratory data in the context of their chronic comorbidities is felt to place them at high risk for further clinical deterioration. Furthermore, it is not anticipated that the patient will be medically stable for discharge from the hospital within 2 midnights of admission. The following factors support the patient status of inpatient.   " The patient's presenting symptoms include STEMI. " The worrisome physical exam findings include cardiogenic shock " The initial radiographic and laboratory data are worrisome because of hemodynamc instabilitiy " The chronic co-morbidities include n/a   * I certify that at the point of admission it is my clinical judgment that the patient will require inpatient hospital care spanning beyond 2 midnights from the point of admission due to high intensity of service, high risk for further deterioration and high frequency of surveillance required.*    For questions or updates, please contact [facility] Please consult [email or web address] for contact info under     Signed, Laurell Josephs, MD  10/05/2018 4:42 AM

## 2018-10-05 NOTE — Anesthesia Preprocedure Evaluation (Signed)
Anesthesia Evaluation  Patient identified by MRN, date of birth, ID band Patient awake    Reviewed: Allergy & Precautions, NPO status , Patient's Chart, lab work & pertinent test results, Unable to perform ROS - Chart review onlyPreop documentation limited or incomplete due to emergent nature of procedure.  Airway Mallampati: II  TM Distance: >3 FB Neck ROM: Full    Dental  (+) Teeth Intact, Dental Advisory Given   Pulmonary former smoker,    Pulmonary exam normal breath sounds clear to auscultation       Cardiovascular + CAD and + Past MI  Normal cardiovascular exam Rhythm:Regular Rate:Normal  IABP in place    Neuro/Psych negative neurological ROS  negative psych ROS   GI/Hepatic Neg liver ROS, GERD  Medicated,  Endo/Other  negative endocrine ROSObesity   Renal/GU negative Renal ROS     Musculoskeletal  (+) Arthritis ,   Abdominal   Peds  Hematology negative hematology ROS (+)   Anesthesia Other Findings Day of surgery medications reviewed with the patient.  H/o lymphoma   Reproductive/Obstetrics                             Anesthesia Physical Anesthesia Plan  ASA: IV and emergent  Anesthesia Plan: General   Post-op Pain Management:    Induction: Intravenous  PONV Risk Score and Plan: 3 and Treatment may vary due to age or medical condition  Airway Management Planned: Oral ETT  Additional Equipment: Arterial line, CVP, PA Cath, TEE and Ultrasound Guidance Line Placement  Intra-op Plan:   Post-operative Plan: Post-operative intubation/ventilation  Informed Consent: I have reviewed the patients History and Physical, chart, labs and discussed the procedure including the risks, benefits and alternatives for the proposed anesthesia with the patient or authorized representative who has indicated his/her understanding and acceptance.     Dental advisory given, Only emergency  history available and History available from chart only  Plan Discussed with: CRNA  Anesthesia Plan Comments:         Anesthesia Quick Evaluation

## 2018-10-05 NOTE — Brief Op Note (Signed)
10/05/2018  9:25 AM  PATIENT:  Molly Chandler  84 y.o. female  PRE-OPERATIVE DIAGNOSIS:  ACUTE MI  POST-OPERATIVE DIAGNOSIS:  ACUTE M  PROCEDURE:  Procedure(s): CORONARY ARTERY BYPASS GRAFTING (CABG)x 2,using left internal mammary artery and right leg greater saphenous vein harvested endoscopically (N/A) LIMA-LAD SVG-OM  SURGEON:  Surgeon(s) and Role:    * Bartle, Fernande Boyden, MD - Primary  PHYSICIAN ASSISTANT: Garwood Wentzell PA-C  ANESTHESIA:   general  EBL:  600 mL   BLOOD ADMINISTERED:none  DRAINS: routine pleural and pericardial drains   LOCAL MEDICATIONS USED:  NONE  SPECIMEN:  No Specimen  DISPOSITION OF SPECIMEN:  N/A  COUNTS:  YES  TOURNIQUET:  * No tourniquets in log *  DICTATION: .Dragon Dictation  PLAN OF CARE: Admit to inpatient   PATIENT DISPOSITION:  ICU - intubated and hemodynamically stable.   Delay start of Pharmacological VTE agent (>24hrs) due to surgical blood loss or risk of bleeding: yes

## 2018-10-05 NOTE — Interval H&P Note (Signed)
History and Physical Interval Note:  10/05/2018 0305 AM  Molly Chandler  has presented today for surgery, with the diagnosis of anterior STEMI  The various methods of treatment have been discussed with the patient and family. After consideration of risks, benefits and other options for treatment, the patient has consented to  Procedure(s): Coronary/Graft Acute MI Revascularization (N/A) LEFT HEART CATH AND CORONARY ANGIOGRAPHY (N/A) IABP Insertion (N/A) as a surgical intervention .  The patient's history has been reviewed, patient examined, no change in status, stable for surgery.  I have reviewed the patient's chart and labs.  Questions were answered to the patient's satisfaction.     Glenetta Hew

## 2018-10-05 NOTE — Op Note (Signed)
CARDIOVASCULAR SURGERY OPERATIVE NOTE  10/05/2018  Surgeon:  Gaye Pollack, MD  First Assistant: Jadene Pierini,  PA-C   Preoperative Diagnosis:  Severe left main and two-vessel coronary artery disease s/p STEMI.   Postoperative Diagnosis:  Same   Procedure:  1. Median Sternotomy 2. Extracorporeal circulation 3.   Emergent coronary artery bypass grafting x 2   Left internal mammary graft to the LAD  SVG to OM  4.   Endoscopic vein harvest from the right leg 5.   Insertion of left femoral arterial line.   Anesthesia:  General Endotracheal   Clinical History/Surgical Indication:  The patient is an 84 year old woman with no prior cardiac history who has untreated hyperlipidemia and a remote history of non-Hodgkin's lymphoma for which she has been in remission for more than 20 years.  She was in her usual state of health until yesterday evening when she developed epigastric discomfort associated with nausea and vomiting as well as diarrhea.  Her and her daughter were initially concerned that she may have contracted a viral illness.  Symptoms improved and she went to bed but just before midnight developed acute substernal chest pain.  EMS was called and baseline EKG showed diffuse ST segment elevation.  She was hypotensive in the field.  A code STEMI was activated and patient brought directly to the catheterization lab.  Cardiac catheterization showed a 95% distal left main and ostial LAD stenosis with about 50% ostial left circumflex stenosis.  Left ventricular ejection fraction was 25 to 35% by visual estimate with akinesis of the anterior wall.  LVEDP was 27.  An intra-aortic balloon pump placed in the Cath Lab due to her anatomy and hypotension.  She was seen by Dr. Roxy Manns who called me to see the patient about doing emergent coronary bypass surgery since he already had a case in the operating  room this morning.  I saw her in the cardiac surgery intensive care unit.  She was hemodynamically stable and not having any chest pain but did have some nausea and vomiting.  After reviewing her catheterization films I felt the only option was emergent coronary artery bypass graft surgery. I discussed the operative procedure with the patient and family including alternatives, benefits and risks; including but not limited to bleeding, blood transfusion, infection, stroke, myocardial infarction, graft failure, heart block requiring a permanent pacemaker, organ dysfunction, and death.  Molly Chandler understands and agrees to proceed.    Preparation:  The patient was seen in the cardiac surgery ICU  and the correct patient, correct operation were confirmed with the patient after reviewing the medical record and catheterization. The consent was signed by me. Preoperative antibiotics were given. The patient was taken back to the operating room and positioned supine on the operating room table. After being placed under general endotracheal anesthesia by the anesthesia team a foley catheter was placed. A pulmonary arterial line was placed by the anesthesia team.  The patient already had a right radial arterial sheath in from her catheterization.  The neck, chest, abdomen, and both legs were prepped with betadine soap and solution and draped in the usual sterile manner. A surgical time-out was taken and the correct patient and operative procedure were confirmed with the nursing and anesthesia staff.  Insertion of left femoral arterial line:  The left common femoral artery was located using ultrasound guidance.  The artery was cannulated with a needle without difficulty and a guidewire passed up into the iliac artery.  A femoral  arterial line was then placed over the guidewire which was removed.  There was excellent blood return.  The femoral line was connected to the transducer line.  There was good blood  pressure tracing.  It was sutured to the skin with silk sutures.   TEE: performed by Dr. Hoy Morn   Echo Findings   Left Ventricle Normal cavity size and wall thickness. LV systolic function is mildly to moderately reduced with an EF of 40-45%. Wall motion is abnormal. Anterior wall motion is hypokinetic. No thrombus present. No mass present.  Septum Normal atrial septum with no atrial septal defect and normal septal motion.  Left Atrium Left atrium normal in structure and function.  Aortic Valve Structurally normal trileaflet aortic valve with no regurgitation or stenosis.  Aorta No aneurysm present. No graft present. No significant coarctation present. Balloon pump present Balloon pump present No aortic dissection present  Mitral Valve Normal valve structure. Normal transmitral gradient. No stenosis. Normal leaflet mobility. Trace regurgitation.  Right Ventricle Normal wall thickness and ejection fraction. Cavity is mildly dilated.  Right Atrium Normal right atrial size.  Tricuspid Valve Normal tricuspid valve structure. No stenosis. Moderate regurgitation. The tricuspid valve regurgitation jet is central.  Pulmonic Valve Normal pulmonic valve structure. No stenosis. No regurgitation.  Pericardium Normal pericardium with no pericardial effusion.  Pulmonary Artery Normal pulmonary artery with no dilation.  Post-Op TEE AORTA Aorta unchanged from pre-bypass (Aortic balloon pump still present in descending aorta).  LEFT VENTRICLE Left ventricle unchanged from pre-bypass: EF 45-50%. Improved anterior wall motion, but still mildly hypokinetic.  RIGHT VENTRICLE Right ventricle unchanged from pre-bypass.  AORTIC VALVE Aortic valve unchanged from pre-bypass MITRAL VALVE Mitral valve unchanged from pre-bypass: Mild MR.  TRICUSPID VALVE Tricuspid valve unchanged from pre-bypass.    Cardiopulmonary Bypass:  A median sternotomy was performed. The pericardium was opened in the midline.  Right ventricular function appeared normal. The ascending aorta was of normal size and had no palpable plaque. There were no contraindications to aortic cannulation or cross-clamping. The patient was fully systemically heparinized and the ACT was maintained > 400 sec. The proximal aortic arch was cannulated with a 20 F aortic cannula for arterial inflow. Venous cannulation was performed via the right atrial appendage using a two-staged venous cannula. An antegrade cardioplegia/vent cannula was inserted into the mid-ascending aorta. Aortic occlusion was performed with a single cross-clamp. Systemic cooling to 32 degrees Centigrade and topical cooling of the heart with iced saline were used. Hyperkalemic antegrade cold blood cardioplegia was used to induce diastolic arrest and was then given at about 20 minute intervals throughout the period of arrest to maintain myocardial temperature at or below 10 degrees centigrade. A temperature probe was inserted into the interventricular septum and an insulating pad was placed in the pericardium.   Left internal mammary harvest:  The left side of the sternum was retracted using the Rultract retractor. The left internal mammary artery was harvested as a pedicle graft. All side branches were clipped. It was a medium-sized vessel of good quality with excellent blood flow. It was ligated distally and divided. It was sprayed with topical papaverine solution to prevent vasospasm.   Endoscopic vein harvest:  The right greater saphenous vein was harvested endoscopically through a 2 cm incision medial to the right knee. It was harvested from the upper thigh to below the knee. It was a medium to large-sized vein of good quality. The side branches were all ligated with 4-0 silk ties.  Coronary arteries:  The coronary arteries were examined.   LAD:  Large vessel with no distal disease  LCX:  Moderate sized OM proximally but the distal vessel was small. No distal  disease. The OM2 was small.  RCA:  No disease on cath.   Grafts:  1. LIMA to the LAD: 2.0 mm. It was sewn end to side using 8-0 prolene continuous suture. 2. SVG to OM:  1.6 mm. It was sewn end to side using 7-0 prolene continuous suture.    The proximal vein graft anastomosis was performed to the mid-ascending aorta using continuous 6-0 prolene suture. A graft marker was placed around the proximal anastomosis.   Completion:  The patient was rewarmed to 37 degrees Centigrade. The clamp was removed from the LIMA pedicle and there was rapid warming of the septum and return of ventricular fibrillation. The crossclamp was removed with a time of 52 minutes. There was spontaneous return of sinus rhythm. The distal and proximal anastomoses were checked for hemostasis. The position of the grafts was satisfactory. Two temporary epicardial pacing wires were placed on the right atrium and two on the right ventricle. The patient was weaned from CPB without difficulty on milrinone 0.3 and dopamine 3.  IABP was continue at 1:1. CPB time was 76 minutes. Cardiac output was 3.5 LPM. Heparin was fully reversed with protamine and the aortic and venous cannulas removed. Hemostasis was achieved. Mediastinal and left pleural drainage tubes were placed. The sternum was closed with #6 stainless steel wires. The fascia was closed with continuous # 1 vicryl suture. The subcutaneous tissue was closed with 2-0 vicryl continuous suture. The skin was closed with 3-0 vicryl subcuticular suture. All sponge, needle, and instrument counts were reported correct at the end of the case. Dry sterile dressings were placed over the incisions and around the chest tubes which were connected to pleurevac suction. The patient was then transported to the surgical intensive care unit in critical but stable condition.

## 2018-10-06 ENCOUNTER — Other Ambulatory Visit: Payer: Self-pay

## 2018-10-06 ENCOUNTER — Inpatient Hospital Stay (HOSPITAL_COMMUNITY): Payer: Medicare Other

## 2018-10-06 ENCOUNTER — Encounter (HOSPITAL_COMMUNITY): Payer: Self-pay | Admitting: Surgery

## 2018-10-06 LAB — BASIC METABOLIC PANEL
Anion gap: 6 (ref 5–15)
Anion gap: 7 (ref 5–15)
BUN: 16 mg/dL (ref 8–23)
BUN: 15 mg/dL (ref 8–23)
CO2: 23 mmol/L (ref 22–32)
CO2: 22 mmol/L (ref 22–32)
Calcium: 8.1 mg/dL — ABNORMAL LOW (ref 8.9–10.3)
Calcium: 8.2 mg/dL — ABNORMAL LOW (ref 8.9–10.3)
Chloride: 113 mmol/L — ABNORMAL HIGH (ref 98–111)
Chloride: 113 mmol/L — ABNORMAL HIGH (ref 98–111)
Creatinine, Ser: 0.71 mg/dL (ref 0.44–1.00)
Creatinine, Ser: 0.69 mg/dL (ref 0.44–1.00)
GFR calc Af Amer: >60 mL/min (ref >60)
GFR calc Af Amer: >60 mL/min (ref >60)
GFR calc non Af Amer: >60 mL/min (ref >60)
GFR calc non Af Amer: >60 mL/min (ref >60)
Glucose, Bld: 115 mg/dL — ABNORMAL HIGH (ref 70–99)
Glucose, Bld: 130 mg/dL — ABNORMAL HIGH (ref 70–99)
Potassium: 3.9 mmol/L (ref 3.5–5.1)
Potassium: 3.9 mmol/L (ref 3.5–5.1)
Sodium: 142 mmol/L (ref 135–145)
Sodium: 142 mmol/L (ref 135–145)

## 2018-10-06 LAB — PREPARE PLATELET PHERESIS
Unit division: 0
Unit division: 0
Unit division: 0

## 2018-10-06 LAB — CBC
HCT: 29.9 % — ABNORMAL LOW (ref 36.0–46.0)
HCT: 29.2 % — ABNORMAL LOW (ref 36.0–46.0)
Hemoglobin: 9.9 g/dL — ABNORMAL LOW (ref 12.0–15.0)
Hemoglobin: 9.8 g/dL — ABNORMAL LOW (ref 12.0–15.0)
MCH: 30.2 pg (ref 26.0–34.0)
MCH: 30.9 pg (ref 26.0–34.0)
MCHC: 33.1 g/dL (ref 30.0–36.0)
MCHC: 33.6 g/dL (ref 30.0–36.0)
MCV: 91.2 fL (ref 80.0–100.0)
MCV: 92.1 fL (ref 80.0–100.0)
Platelets: 74 10*3/uL — ABNORMAL LOW (ref 150–400)
Platelets: 60 10*3/uL — ABNORMAL LOW (ref 150–400)
RBC: 3.28 MIL/uL — ABNORMAL LOW (ref 3.87–5.11)
RBC: 3.17 MIL/uL — ABNORMAL LOW (ref 3.87–5.11)
RDW: 14.3 % (ref 11.5–15.5)
RDW: 14.4 % (ref 11.5–15.5)
WBC: 3.7 10*3/uL — ABNORMAL LOW (ref 4.0–10.5)
WBC: 6.2 10*3/uL (ref 4.0–10.5)
nRBC: 0 % (ref 0.0–0.2)
nRBC: 0 % (ref 0.0–0.2)

## 2018-10-06 LAB — BLOOD GAS, ARTERIAL
Acid-base deficit: 0.6 mmol/L (ref 0.0–2.0)
Bicarbonate: 23.6 mmol/L (ref 20.0–28.0)
Drawn by: 44166
FIO2: 50
MECHVT: 350 mL
O2 Saturation: 99.4 %
PEEP: 5 cmH2O
Patient temperature: 98.6
RATE: 16 resp/min
pCO2 arterial: 39.2 mmHg (ref 32.0–48.0)
pH, Arterial: 7.396 (ref 7.350–7.450)
pO2, Arterial: 189 mmHg — ABNORMAL HIGH (ref 83.0–108.0)

## 2018-10-06 LAB — BPAM PLATELET PHERESIS
Blood Product Expiration Date: 202001292359
Blood Product Expiration Date: 202001302359
Blood Product Expiration Date: 202001312359
ISSUE DATE / TIME: 202001281151
ISSUE DATE / TIME: 202001281157
ISSUE DATE / TIME: 202001281327
Unit Type and Rh: 5100
Unit Type and Rh: 6200
Unit Type and Rh: 8400

## 2018-10-06 LAB — POCT I-STAT 7, (LYTES, BLD GAS, ICA,H+H)
Acid-base deficit: 4 mmol/L — ABNORMAL HIGH (ref 0.0–2.0)
Acid-base deficit: 3 mmol/L — ABNORMAL HIGH (ref 0.0–2.0)
Bicarbonate: 21.7 mmol/L (ref 20.0–28.0)
Bicarbonate: 22.5 mmol/L (ref 20.0–28.0)
Calcium, Ion: 1.17 mmol/L (ref 1.15–1.40)
Calcium, Ion: 1.21 mmol/L (ref 1.15–1.40)
HCT: 24 % — ABNORMAL LOW (ref 36.0–46.0)
HCT: 25 % — ABNORMAL LOW (ref 36.0–46.0)
Hemoglobin: 8.2 g/dL — ABNORMAL LOW (ref 12.0–15.0)
Hemoglobin: 8.5 g/dL — ABNORMAL LOW (ref 12.0–15.0)
O2 Saturation: 98 %
O2 Saturation: 96 %
Potassium: 3.6 mmol/L (ref 3.5–5.1)
Potassium: 3.9 mmol/L (ref 3.5–5.1)
Sodium: 147 mmol/L — ABNORMAL HIGH (ref 135–145)
Sodium: 146 mmol/L — ABNORMAL HIGH (ref 135–145)
TCO2: 23 mmol/L (ref 22–32)
TCO2: 24 mmol/L (ref 22–32)
pCO2 arterial: 41.5 mmHg (ref 32.0–48.0)
pCO2 arterial: 41.1 mmHg (ref 32.0–48.0)
pH, Arterial: 7.327 — ABNORMAL LOW (ref 7.350–7.450)
pH, Arterial: 7.347 — ABNORMAL LOW (ref 7.350–7.450)
pO2, Arterial: 123 mmHg — ABNORMAL HIGH (ref 83.0–108.0)
pO2, Arterial: 84 mmHg (ref 83.0–108.0)

## 2018-10-06 LAB — COOXEMETRY PANEL
Carboxyhemoglobin: 1.3 % (ref 0.5–1.5)
Methemoglobin: 1.2 % (ref 0.0–1.5)
O2 Saturation: 72.2 %
Total hemoglobin: 10.5 g/dL — ABNORMAL LOW (ref 12.0–16.0)

## 2018-10-06 LAB — GLUCOSE, CAPILLARY
Glucose-Capillary: 108 mg/dL — ABNORMAL HIGH (ref 70–99)
Glucose-Capillary: 110 mg/dL — ABNORMAL HIGH (ref 70–99)
Glucose-Capillary: 112 mg/dL — ABNORMAL HIGH (ref 70–99)
Glucose-Capillary: 112 mg/dL — ABNORMAL HIGH (ref 70–99)
Glucose-Capillary: 113 mg/dL — ABNORMAL HIGH (ref 70–99)
Glucose-Capillary: 114 mg/dL — ABNORMAL HIGH (ref 70–99)

## 2018-10-06 LAB — MAGNESIUM
Magnesium: 2.5 mg/dL — ABNORMAL HIGH (ref 1.7–2.4)
Magnesium: 2.1 mg/dL (ref 1.7–2.4)

## 2018-10-06 MED ORDER — SODIUM BICARBONATE 8.4 % IV SOLN
50.0000 meq | Freq: Once | INTRAVENOUS | Status: AC
  Administered 2018-10-06: 50 meq via INTRAVENOUS

## 2018-10-06 MED ORDER — SODIUM BICARBONATE 8.4 % IV SOLN
INTRAVENOUS | Status: AC
  Filled 2018-10-06: qty 50

## 2018-10-06 MED ORDER — LATANOPROST 0.005 % OP SOLN
1.0000 [drp] | Freq: Every day | OPHTHALMIC | Status: DC
  Administered 2018-10-06 – 2018-10-13 (×8): 1 [drp] via OPHTHALMIC
  Filled 2018-10-06 (×2): qty 2.5

## 2018-10-06 MED ORDER — ORAL CARE MOUTH RINSE
15.0000 mL | Freq: Two times a day (BID) | OROMUCOSAL | Status: DC
  Administered 2018-10-06 – 2018-10-10 (×6): 15 mL via OROMUCOSAL

## 2018-10-06 MED FILL — Thrombin (Recombinant) For Soln 20000 Unit: CUTANEOUS | Qty: 1 | Status: AC

## 2018-10-06 MED FILL — Norepinephrine-Dextrose IV Solution 4 MG/250ML-5%: INTRAVENOUS | Qty: 250 | Status: AC

## 2018-10-06 MED FILL — Cangrelor Tetrasodium For IV Soln 50 MG: INTRAVENOUS | Qty: 50 | Status: AC

## 2018-10-06 NOTE — Procedures (Signed)
Extubation Procedure Note  Patient Details:   Name: Molly Chandler DOB: Jul 16, 1935 MRN: [id number]   Airway Documentation:    Vent end date: 10/06/18 Vent end time: 1118   Evaluation  O2 sats: stable throughout Complications: No apparent complications Patient did tolerate procedure well. Bilateral Breath Sounds: Clear, Diminished   Yes   Pt was extubated to 4 LPM nasal cannula. Patient had a positive cuff leak. NIF -25 and VC of .75. No stridor noted. Patient was able to speak name and had a strong productive cough. BBS had slight Rhonchi. RT will continue to monitor.     Slayton Lubitz M 10/06/2018, 11:41 AM

## 2018-10-06 NOTE — Anesthesia Postprocedure Evaluation (Signed)
Anesthesia Post Note  Patient: Molly Chandler  Procedure(s) Performed: CORONARY ARTERY BYPASS GRAFTING (CABG)x 2,using left internal mammary artery and right leg greater saphenous vein harvested endoscopically (N/A Chest)     Patient location during evaluation: SICU Anesthesia Type: General Level of consciousness: sedated Pain management: pain level controlled Vital Signs Assessment: post-procedure vital signs reviewed and stable Respiratory status: patient remains intubated per anesthesia plan Cardiovascular status: stable Postop Assessment: no apparent nausea or vomiting Anesthetic complications: no    Last Vitals:  Vitals:   10/06/18 1000 10/06/18 1007  BP: 106/62   Pulse: 89 89  Resp: 13 (!) 29  Temp: 36.6 C   SpO2: 98% 99%    Last Pain:  Vitals:   10/06/18 0800  TempSrc: Core  PainSc:                  Catalina Gravel

## 2018-10-06 NOTE — Plan of Care (Signed)
  Problem: Cardiac: Goal: Will achieve and/or maintain hemodynamic stability Outcome: Progressing   Problem: Respiratory: Goal: Respiratory status will improve Outcome: Progressing   

## 2018-10-06 NOTE — Progress Notes (Signed)
      [address]       [place], [address]             [phone]      POD # 1 CABG x 2  Extubated earlier today BP (!) 88/34   Pulse (!) 29   Temp 98.2 F (36.8 C)   Resp 13   Ht 4' 11.5" (1.511 m)   Wt 92.6 kg   SpO2 100%   BMI 40.54 kg/m  CI= 2.7 Dr. Cyndia Bent has just pulled IABP  Intake/Output Summary (Last 24 hours) at 10/06/2018 1723 Last data filed at 10/06/2018 1700 Gross per 24 hour  Intake 2106.33 ml  Output 1460 ml  Net 646.33 ml   Hct= 25, K= 3.9  Doing well POD # 1  Gryphon Vanderveen C. Roxan Hockey, MD [facility] [phone]

## 2018-10-06 NOTE — Progress Notes (Signed)
1 Day Post-Op Procedure(s) (LRB): CORONARY ARTERY BYPASS GRAFTING (CABG)x 2,using left internal mammary artery and right leg greater saphenous vein harvested endoscopically (N/A) Subjective: Intubated on vent, sedated on Precedex but has woken up and following commands.  Hemodynamically stable overnight with CI 2.4 and Co-ox 72% on milrinone 0.3, dop 3, IABP 1:3.  Objective: Vital signs in last 24 hours: Temp:  [96.3 F (35.7 C)-99.1 F (37.3 C)] 98.1 F (36.7 C) (01/29 0700) Pulse Rate:  [81-122] 89 (01/29 0700) Cardiac Rhythm: Atrial paced (01/29 0400) Resp:  [12-28] 22 (01/28 1950) BP: (80-149)/(38-88) 121/55 (01/29 0700) SpO2:  [94 %-100 %] 99 % (01/29 0700) Arterial Line BP: (62-123)/(29-103) 64/45 (01/29 0700) FiO2 (%):  [50 %] 50 % (01/29 0400) Weight:  [92.6 kg] 92.6 kg (01/29 0500)  Hemodynamic parameters for last 24 hours: PAP: (25-47)/(12-26) 32/16 CO:  [2.6 L/min-4.2 L/min] 4.2 L/min CI:  [1.5 L/min/m2-2.4 L/min/m2] 2.4 L/min/m2  Intake/Output from previous day: 01/28 0701 - 01/29 0700 In: 8834.2 [I.V.:5904.8; JEHUD:1497; NG/GT:30; IV Piggyback:1516.4] Out: 2908 [Urine:1935; Emesis/NG output:3; Blood:600; Chest Tube:370] Intake/Output this shift: No intake/output data recorded.  Heart: regular rate and rhythm, S1, S2 normal, no murmur, click, rub or gallop Lungs: clear to auscultation bilaterally Abdomen: soft, non-distended, hypoactive BS Extremities: edema mild. Doppler signals present in feet. Wound: dressings dry  Lab Results: Recent Labs    10/05/18 1953 10/06/18 0336  WBC 4.1 3.7*  HGB 10.3* 9.9*  HCT 31.2* 29.9*  PLT 93* 74*   BMET:  Recent Labs    10/05/18 0708  10/05/18 1215  10/05/18 1952 10/05/18 1953 10/06/18 0336  NA 138   < > 143   < > 146*  --  142  K 3.7   < > 4.1   < > 4.0  --  3.9  CL 108  --   --   --   --   --  113*  CO2 18*  --   --   --   --   --  23  GLUCOSE 130*   < > 127*  --   --   --  115*  BUN 24*  --   --   --    --   --  16  CREATININE 0.81  --   --   --   --  0.74 0.71  CALCIUM 8.0*  --   --   --   --   --  8.1*   < > = values in this interval not displayed.    PT/INR:  Recent Labs    10/05/18 1344  LABPROT 21.2*  INR 1.86   ABG    Component Value Date/Time   PHART 7.396 10/06/2018 0325   HCO3 23.6 10/06/2018 0325   TCO2 27 10/05/2018 1952   ACIDBASEDEF 0.6 10/06/2018 0325   O2SAT 99.4 10/06/2018 0325   O2SAT 72.2 10/06/2018 0325   CBG (last 3)  Recent Labs    10/05/18 1950 10/05/18 2333 10/06/18 0335  GLUCAP 122* 112* 110*   CXR: mild interstitial edema  ECG: sinus, low voltage  Assessment/Plan: S/P Procedure(s) (LRB): CORONARY ARTERY BYPASS GRAFTING (CABG)x 2,using left internal mammary artery and right leg greater saphenous vein harvested endoscopically (N/A)  POD 1 Hemodynamically stable on current inotropes and IABP. EF 25% preop with acute STEMI and cardiogenic shock.  Improved to 40% post-bypass but had significant MI with troponin 14.05 preop. Oxygenation better and CXR ok so will wean off Precedex and wean vent to extubate.  Then work on weaning IABP. Continue milrinone and dopamine.  Expected acute postop blood loss anemia: stable  Discussed status and plans with family at bedside this am.   LOS: 1 day    Gaye Pollack 10/06/2018

## 2018-10-06 NOTE — Progress Notes (Signed)
Patient ID: Molly Chandler, female   DOB: 1935/01/19, 84 y.o.   MRN: [id number] TCTS:   IABP weaned today and removed by me without difficulty. Pressure held for 20 minutes with complete hemostasis.

## 2018-10-07 ENCOUNTER — Inpatient Hospital Stay (HOSPITAL_COMMUNITY): Payer: Medicare Other

## 2018-10-07 LAB — BASIC METABOLIC PANEL
Anion gap: 5 (ref 5–15)
BUN: 16 mg/dL (ref 8–23)
CO2: 26 mmol/L (ref 22–32)
Calcium: 7.6 mg/dL — ABNORMAL LOW (ref 8.9–10.3)
Chloride: 111 mmol/L (ref 98–111)
Creatinine, Ser: 0.73 mg/dL (ref 0.44–1.00)
GFR calc Af Amer: >60 mL/min (ref >60)
GFR calc non Af Amer: >60 mL/min (ref >60)
Glucose, Bld: 103 mg/dL — ABNORMAL HIGH (ref 70–99)
Potassium: 3.6 mmol/L (ref 3.5–5.1)
Sodium: 142 mmol/L (ref 135–145)

## 2018-10-07 LAB — GLUCOSE, CAPILLARY
Glucose-Capillary: 91 mg/dL (ref 70–99)
Glucose-Capillary: 96 mg/dL (ref 70–99)
Glucose-Capillary: 86 mg/dL (ref 70–99)
Glucose-Capillary: 79 mg/dL (ref 70–99)
Glucose-Capillary: 69 mg/dL — ABNORMAL LOW (ref 70–99)

## 2018-10-07 LAB — COOXEMETRY PANEL
Carboxyhemoglobin: 1.1 % (ref 0.5–1.5)
Methemoglobin: 1.9 % — ABNORMAL HIGH (ref 0.0–1.5)
O2 Saturation: 81.1 %
Total hemoglobin: 8.4 g/dL — ABNORMAL LOW (ref 12.0–16.0)

## 2018-10-07 LAB — CBC
HCT: 28 % — ABNORMAL LOW (ref 36.0–46.0)
Hemoglobin: 9.1 g/dL — ABNORMAL LOW (ref 12.0–15.0)
MCH: 30.6 pg (ref 26.0–34.0)
MCHC: 32.5 g/dL (ref 30.0–36.0)
MCV: 94.3 fL (ref 80.0–100.0)
Platelets: 58 10*3/uL — ABNORMAL LOW (ref 150–400)
RBC: 2.97 MIL/uL — ABNORMAL LOW (ref 3.87–5.11)
RDW: 14.6 % (ref 11.5–15.5)
WBC: 6.6 10*3/uL (ref 4.0–10.5)
nRBC: 0 % (ref 0.0–0.2)

## 2018-10-07 MED ORDER — MILRINONE LACTATE IN DEXTROSE 20-5 MG/100ML-% IV SOLN
0.1000 ug/kg/min | INTRAVENOUS | Status: DC
  Administered 2018-10-07: 0.2 ug/kg/min via INTRAVENOUS
  Administered 2018-10-09: 0.1 ug/kg/min via INTRAVENOUS
  Filled 2018-10-07 (×2): qty 100

## 2018-10-07 MED ORDER — POTASSIUM CHLORIDE 10 MEQ/50ML IV SOLN
10.0000 meq | INTRAVENOUS | Status: DC | PRN
  Administered 2018-10-07 – 2018-10-08 (×2): 10 meq via INTRAVENOUS
  Filled 2018-10-07 (×2): qty 50

## 2018-10-07 MED ORDER — POTASSIUM CHLORIDE 10 MEQ/50ML IV SOLN
10.0000 meq | INTRAVENOUS | Status: AC
  Administered 2018-10-07 (×3): 10 meq via INTRAVENOUS

## 2018-10-07 MED ORDER — LEVALBUTEROL HCL 1.25 MG/0.5ML IN NEBU: 1.2500 mg | INHALATION_SOLUTION | Freq: Four times a day (QID) | RESPIRATORY_TRACT | Status: DC | PRN

## 2018-10-07 MED ORDER — TRAMADOL HCL 50 MG PO TABS
50.0000 mg | ORAL_TABLET | ORAL | Status: DC | PRN
  Administered 2018-10-11: 50 mg via ORAL
  Filled 2018-10-07 (×2): qty 1

## 2018-10-07 MED ORDER — FUROSEMIDE 10 MG/ML IJ SOLN
40.0000 mg | Freq: Once | INTRAMUSCULAR | Status: AC
  Administered 2018-10-07: 40 mg via INTRAVENOUS
  Filled 2018-10-07: qty 4

## 2018-10-07 MED FILL — Sodium Bicarbonate IV Soln 8.4%: INTRAVENOUS | Qty: 50 | Status: AC

## 2018-10-07 MED FILL — Dexmedetomidine HCl in NaCl 0.9% IV Soln 400 MCG/100ML: INTRAVENOUS | Qty: 100 | Status: AC

## 2018-10-07 MED FILL — Heparin Sodium (Porcine) Inj 1000 Unit/ML: INTRAMUSCULAR | Qty: 10 | Status: AC

## 2018-10-07 MED FILL — Potassium Chloride Inj 2 mEq/ML: INTRAVENOUS | Qty: 40 | Status: AC

## 2018-10-07 MED FILL — Heparin Sodium (Porcine) Inj 1000 Unit/ML: INTRAMUSCULAR | Qty: 30 | Status: AC

## 2018-10-07 MED FILL — Sodium Chloride IV Soln 0.9%: INTRAVENOUS | Qty: 2000 | Status: AC

## 2018-10-07 MED FILL — Calcium Chloride Inj 10%: INTRAVENOUS | Qty: 10 | Status: AC

## 2018-10-07 MED FILL — Magnesium Sulfate Inj 50%: INTRAMUSCULAR | Qty: 10 | Status: AC

## 2018-10-07 MED FILL — Electrolyte-R (PH 7.4) Solution: INTRAVENOUS | Qty: 4000 | Status: AC

## 2018-10-07 MED FILL — Mannitol IV Soln 20%: INTRAVENOUS | Qty: 500 | Status: AC

## 2018-10-07 MED FILL — Lidocaine HCl(Cardiac) IV PF Soln Pref Syr 100 MG/5ML (2%): INTRAVENOUS | Qty: 5 | Status: AC

## 2018-10-07 NOTE — Progress Notes (Signed)
Patient ID: Molly Chandler, female   DOB: 09/13/1934, 84 y.o.   MRN: [id number] TCTS Evening Rounds:  Hemodynamically stable but still on neo 35 mcg, milrinone 0.2 and dop 3.  Diuresed well today  sats 97%.  Has femoral arterial line in for BP monitoring so has not been up out of bed yet. Will probably remove it in am.

## 2018-10-07 NOTE — Progress Notes (Signed)
2 Days Post-Op Procedure(s) (LRB): CORONARY ARTERY BYPASS GRAFTING (CABG)x 2,using left internal mammary artery and right leg greater saphenous vein harvested endoscopically (N/A) Subjective: No complaints  Objective: Vital signs in last 24 hours: Temp:  [97.3 F (36.3 C)-99.1 F (37.3 C)] 98.6 F (37 C) (01/30 0600) Pulse Rate:  [28-96] 96 (01/30 0600) Cardiac Rhythm: Atrial paced (01/30 0400) Resp:  [10-29] 10 (01/30 0600) BP: (76-128)/(34-84) 92/78 (01/30 0600) SpO2:  [97 %-100 %] 99 % (01/30 0600) Arterial Line BP: (79-129)/(29-57) 109/51 (01/30 0600) FiO2 (%):  [40 %] 40 % (01/29 1007) Weight:  [93.7 kg] 93.7 kg (01/30 0500)  Hemodynamic parameters for last 24 hours: PAP: (37-53)/(11-24) 46/23 CO:  [4.5 L/min-5.4 L/min] 5.4 L/min CI:  [2.7 L/min/m2-3.2 L/min/m2] 3.2 L/min/m2  Intake/Output from previous day: 01/29 0701 - 01/30 0700 In: 1798.4 [P.O.:120; I.V.:1378.3; NG/GT:100; IV Piggyback:200.1] Out: 1065 [Urine:715; Chest Tube:350] Intake/Output this shift: No intake/output data recorded.  General appearance: alert and cooperative Neurologic: intact Heart: regular rate and rhythm, S1, S2 normal, no murmur, click, rub or gallop Lungs: clear to auscultation bilaterally Abdomen: soft, non-tender; bowel sounds normal; no masses,  no organomegaly Extremities: edema moderate. Right groin site looks good. Wound: dressings dry.   Lab Results: Recent Labs    10/06/18 1700 10/07/18 0602  WBC 6.2 6.6  HGB 9.8* 9.1*  HCT 29.2* 28.0*  PLT 60* 58*   BMET:  Recent Labs    10/06/18 1700 10/07/18 0602  NA 142 142  K 3.9 3.6  CL 113* 111  CO2 22 26  GLUCOSE 130* 103*  BUN 15 16  CREATININE 0.69 0.73  CALCIUM 8.2* 7.6*    PT/INR:  Recent Labs    10/05/18 1344  LABPROT 21.2*  INR 1.86   ABG    Component Value Date/Time   PHART 7.347 (L) 10/06/2018 1209   HCO3 22.5 10/06/2018 1209   TCO2 24 10/06/2018 1209   ACIDBASEDEF 3.0 (H) 10/06/2018 1209   O2SAT  81.1 10/07/2018 0525   CBG (last 3)  Recent Labs    10/06/18 2339 10/07/18 0446 10/07/18 0724  GLUCAP 112* 91 96   CXR: ok  Assessment/Plan: S/P Procedure(s) (LRB): CORONARY ARTERY BYPASS GRAFTING (CABG)x 2,using left internal mammary artery and right leg greater saphenous vein harvested endoscopically (N/A)  POD 2  Hemodynamically stable with CI 3.2 and Co-ox 81 on milrinone 0.3 and dop 3. Will wean milrinone as tolerated. Still on some neo.  Volume excess: start diuresis.  DC chest tubes, swan.  IS.  Still has left femoral arterial line in but can be sitting up with that.   LOS: 2 days    Gaye Pollack 10/07/2018

## 2018-10-08 ENCOUNTER — Inpatient Hospital Stay (HOSPITAL_COMMUNITY): Payer: Medicare Other

## 2018-10-08 LAB — CBC
HCT: 28.2 % — ABNORMAL LOW (ref 36.0–46.0)
Hemoglobin: 9 g/dL — ABNORMAL LOW (ref 12.0–15.0)
MCH: 30.3 pg (ref 26.0–34.0)
MCHC: 31.9 g/dL (ref 30.0–36.0)
MCV: 94.9 fL (ref 80.0–100.0)
Platelets: 69 10*3/uL — ABNORMAL LOW (ref 150–400)
RBC: 2.97 MIL/uL — ABNORMAL LOW (ref 3.87–5.11)
RDW: 13.8 % (ref 11.5–15.5)
WBC: 7.1 10*3/uL (ref 4.0–10.5)
nRBC: 0 % (ref 0.0–0.2)

## 2018-10-08 LAB — BASIC METABOLIC PANEL
Anion gap: 6 (ref 5–15)
BUN: 14 mg/dL (ref 8–23)
CO2: 28 mmol/L (ref 22–32)
Calcium: 8.2 mg/dL — ABNORMAL LOW (ref 8.9–10.3)
Chloride: 107 mmol/L (ref 98–111)
Creatinine, Ser: 0.63 mg/dL (ref 0.44–1.00)
GFR calc Af Amer: >60 mL/min (ref >60)
GFR calc non Af Amer: >60 mL/min (ref >60)
Glucose, Bld: 91 mg/dL (ref 70–99)
Potassium: 4.1 mmol/L (ref 3.5–5.1)
Sodium: 141 mmol/L (ref 135–145)

## 2018-10-08 MED ORDER — AMIODARONE HCL 200 MG PO TABS
200.0000 mg | ORAL_TABLET | Freq: Two times a day (BID) | ORAL | Status: DC
  Administered 2018-10-08 – 2018-10-11 (×7): 200 mg via ORAL
  Filled 2018-10-08 (×7): qty 1

## 2018-10-08 MED ORDER — POTASSIUM CHLORIDE 10 MEQ/50ML IV SOLN
10.0000 meq | INTRAVENOUS | Status: AC
  Administered 2018-10-08 (×2): 10 meq via INTRAVENOUS
  Filled 2018-10-08: qty 50

## 2018-10-08 MED ORDER — POTASSIUM CHLORIDE CRYS ER 20 MEQ PO TBCR
20.0000 meq | EXTENDED_RELEASE_TABLET | Freq: Two times a day (BID) | ORAL | Status: DC
  Administered 2018-10-09 – 2018-10-11 (×5): 20 meq via ORAL
  Filled 2018-10-08 (×5): qty 1

## 2018-10-08 MED ORDER — FUROSEMIDE 10 MG/ML IJ SOLN
20.0000 mg | Freq: Two times a day (BID) | INTRAMUSCULAR | Status: DC
  Administered 2018-10-08 – 2018-10-11 (×6): 20 mg via INTRAVENOUS
  Filled 2018-10-08 (×6): qty 2

## 2018-10-08 NOTE — Progress Notes (Signed)
During bath, pt became tachycardic with HR up to 145, SpO2 88% on RA, placed on 4L [place] with resolve to SpO2 99%.  Pt encouraged to cough and bear down to try and break HR. Dopamine paused during this time. BP stable, 137/84. HR slowed down to 115, restarted dopamine, BP 126/58.

## 2018-10-08 NOTE — Progress Notes (Signed)
Arterial line pull  Site left groin,  Site assessment prior to removal '0',  Manual pressure applied for 20 minutes.  Patient stable during procedure, VSS.  Post sheath groin assessment '0'.  Patient instructed to apply direct pressure to site when coughing, laughing, or sneezing and to keep head on pillow until bed rest is completed.  Distal pulses +1 post pull.  Pt tolerated procedure well.    Karsten Ro, RN

## 2018-10-08 NOTE — Progress Notes (Signed)
TCTS BRIEF SICU PROGRESS NOTE  3 Days Post-Op  S/P Procedure(s) (LRB): CORONARY ARTERY BYPASS GRAFTING (CABG)x 2,using left internal mammary artery and right leg greater saphenous vein harvested endoscopically (N/A)   Stable day NSR w/ stable BP now off Neo O2 sats 92% Diuresing some  Plan: Continue current plan  Rexene Alberts, MD 10/08/2018 4:44 PM

## 2018-10-08 NOTE — Discharge Instructions (Signed)

## 2018-10-08 NOTE — Progress Notes (Addendum)
3 Days Post-Op Procedure(s) (LRB): CORONARY ARTERY BYPASS GRAFTING (CABG)x 2,using left internal mammary artery and right leg greater saphenous vein harvested endoscopically (N/A) Subjective: No complaints. Slept some. Daughter stayed with her overnight.  Objective: Vital signs in last 24 hours: Temp:  [98.2 F (36.8 C)-98.8 F (37.1 C)] 98.8 F (37.1 C) (01/31 0400) Pulse Rate:  [75-97] 76 (01/31 0700) Cardiac Rhythm: Normal sinus rhythm (01/31 0400) Resp:  [10-27] 11 (01/31 0700) BP: (70-134)/(42-95) 97/49 (01/31 0700) SpO2:  [96 %-100 %] 98 % (01/31 0700) Arterial Line BP: (80-155)/(36-93) 117/48 (01/31 0700) Weight:  [91.7 kg] 91.7 kg (01/31 0500)  Hemodynamic parameters for last 24 hours: PAP: (37-50)/(9-20) 50/20  Intake/Output from previous day: 01/30 0701 - 01/31 0700 In: 942.8 [I.V.:853.5; IV Piggyback:89.3] Out: 3910 [Urine:3840; Chest Tube:70] Intake/Output this shift: No intake/output data recorded.  General appearance: alert and cooperative Neurologic: intact Heart: regular rate and rhythm, S1, S2 normal, no murmur, click, rub or gallop Lungs: clear to auscultation bilaterally Abdomen: soft, non-tender; bowel sounds normal; no masses,  no organomegaly Extremities: edema moderate Wound: incision ok  Lab Results: Recent Labs    10/06/18 1700 10/07/18 0602  WBC 6.2 6.6  HGB 9.8* 9.1*  HCT 29.2* 28.0*  PLT 60* 58*   BMET:  Recent Labs    10/07/18 0602 10/08/18 0618  NA 142 141  K 3.6 4.1  CL 111 107  CO2 26 28  GLUCOSE 103* 91  BUN 16 14  CREATININE 0.73 0.63  CALCIUM 7.6* 8.2*    PT/INR:  Recent Labs    10/05/18 1344  LABPROT 21.2*  INR 1.86   ABG    Component Value Date/Time   PHART 7.347 (L) 10/06/2018 1209   HCO3 22.5 10/06/2018 1209   TCO2 24 10/06/2018 1209   ACIDBASEDEF 3.0 (H) 10/06/2018 1209   O2SAT 81.1 10/07/2018 0525   CBG (last 3)  Recent Labs    10/07/18 1101 10/07/18 1607 10/07/18 1936  GLUCAP 86 79 69*     CLINICAL DATA:  Cardiac surgery.  EXAM: PORTABLE CHEST 1 VIEW  COMPARISON:  10/07/2018.  FINDINGS: Interim removal of Swan-Ganz catheter and mediastinal drainage catheter. Right IJ sheath in stable position. Prior CABG. Cardiomegaly with mild bilateral interstitial prominence and small bilateral pleural effusions. Findings consistent with CHF. No pneumothorax.  IMPRESSION: 1. Interim removal Swan-Ganz catheter, mediastinal drainage catheter, and left chest tube.  2. Prior CABG. Cardiomegaly. Diffuse bilateral pulmonary interstitial prominence and small bilateral pleural effusions. Findings consistent CHF.   Electronically Signed   By: Marcello Moores  Register   On: 10/08/2018 06:58  Assessment/Plan: S/P Procedure(s) (LRB): CORONARY ARTERY BYPASS GRAFTING (CABG)x 2,using left internal mammary artery and right leg greater saphenous vein harvested endoscopically (N/A)  POD 3 Hemodynamically stable in sinus rhythm on milrinone 0.2, dop 3, neo 25. Will decrease milrinone to 0.1, wean neo as tolerated, then dopamine when neo off. Holding off on beta blocker until stable off pressors.   Volume excess: weight is 20-30 lbs over preop recorded wt if accurate. Some interstitial edema on CXR. Will continue gentle diuresis once off neo.  IS  PAC's and brief runs of SVT/AF with RVR this am on monitor. Will start oral amiodarone since she will likely have postop atrial fib. Check ECG in am.  DC femoral arterial line.  OOB as tolerated.  Postop thrombocytopenia: likely related to heparin, pump and IABP. CBC pending this am.  Plan ASA and Plavix after pacing wires out since she presented with STEMI.  LOS: 3 days    Gaye Pollack 10/08/2018

## 2018-10-08 NOTE — Discharge Summary (Addendum)
Physician Discharge Summary  Patient ID: Sennie Kinn MRN: [id number] DOB/AGE: 82-03-1935 82 y.o.  Admit date: 10/05/2018 Discharge date: 10/14/2018  Admission Diagnoses:  Patient Active Problem List   Diagnosis Date Noted  . STEMI (ST elevation myocardial infarction) (HCC) 10/05/2018  . Acute ST elevation myocardial infarction (STEMI) involving left main coronary artery (HCC) 10/05/2018  . Osteoarthritis 02/05/2018  . Osteoporosis 02/05/2018  . GERD (gastroesophageal reflux disease) 09/26/2011  . Lymphoma (HCC) 05/30/2010   Discharge Diagnoses:   Patient Active Problem List   Diagnosis Date Noted  . STEMI (ST elevation myocardial infarction) (HCC) 10/05/2018  . Acute ST elevation myocardial infarction (STEMI) involving left main coronary artery (HCC) 10/05/2018  . S/P CABG x 2 10/05/2018  . Osteoarthritis 02/05/2018  . Osteoporosis 02/05/2018  . GERD (gastroesophageal reflux disease) 09/26/2011  . Lymphoma (HCC) 05/30/2010   Discharged Condition: good  History of Present Illness:  Mrs. Duffany is an 84 year old female with no previous cardiac history.  She does have known history of Non-Hodgkins Lymphoma which has been in remission for over 20 years. She was in her usual state of health until the evening of 10/04/2018 when she initially developed epigastric discomfort associated with nausea vomiting and diarrhea beginning around 8 PM.  The patient and her daughter were initially concerned that she might have contracted a viral illness.  Symptoms initially improved and the patient went to bed, but just before midnight she developed more acute substernal chest pain.  EMS was activated and baseline EKG revealed diffuse ST segment elevation associated with hypotension in the field.  Code STEMI was activated and the patient brought directly to the cardiac Cath Lab where catheterization revealed critical stenosis of the distal left main coronary artery.  Following systemic  anticoagulation using heparin the patient's symptoms improved.  Intra-aortic balloon pump was placed.  The patient's blood pressure normalized.  Emergent cardiac surgical consultation was requested.      Hospital Course:   She was evaluated by Dr. Cornelius Moras at which time the patient states she is independent.  She does notice she gets tired with physical activity.  She denied current chest pain and shortness of breath. Due to the critical nature of her LAD stenosis it was felt emergent coronary bypass surgery was indicated.  The risks and benefits of the procedure were explained to the patient and she was agreeable to proceed.  She was taken to the operating room by Dr. Laneta Simmers.  She underwent CABG x 2 utilizing LIMA to LAD and SVG to OM.  She also underwent endoscopic harvest of greater saphenous vein from her right leg.  She tolerated the procedure and was taken to the SICU in critical but stable condition.  During her stay in the SICU the patient was weaned and extubated on 10/06/2018.  She was weaned off IABP which also removed on 10/06/2018.  She was weaned off Milrinone, Dopamine, and Neo-synephrine as tolerated.  Her chest tubes and swan ganz catheter were removed on 10/07/2018.  She was diuresed for hypervolemia.  Her femoral arterial line was removed on 10/08/2018.  She was maintaining NSR.  She was started on low dose beta blocker.  She was medically stable for transfer to the telemetry unit on 10/10/2018.  She continues to make good progress.  She remains in NSR.  Her pacing wires have been removed without difficulty.  Her blood pressure has improved, but we will not start an ACE at this time due to prolonged use of Neo post operatively.  She has been started on DAPT therapy.  She remains mildly hypervolemic and will remain on lasix daily.  The patient developed an episode of lightheadedness, weakness, and near syncopal episode after having a bowel movement.  Her blood pressure dropped into the 80s.  There was  concern that she could be experiencing cardiac tamponade due to early wire removal that day.  Echocardiogram was obtained and did not show evidence of pericardial fluid.  EKG was obtained and was stable.  Troponin levels were mildly elevated and returned to baseline.  She was observed in the SICU.  She developed some abdominal pain which improved after movement of bowels.  Her blood pressure has remained a little on the low side, and her coreg was discontinued. She is ambulating without difficulty.   Her incisions are healing without evidence of infection.  She is medically stable for discharge home today.    Significant Diagnostic Studies: angiography:    CULPRIT LESION: Dist LM to Ost LAD lesion is 95% stenosed. Ost Cx lesion is 50% stenosed.  There is moderate to severe left ventricular systolic dysfunction. The left ventricular ejection fraction is 25-35% by visual estimate -dense anterior wall motion and normality  LV end diastolic pressure is severely elevated.  There is no aortic valve stenosis. There is mild (2+) mitral regurgitation.   SUMMARY   Severe distal left main-ostial LAD 95% stenosis involving ostium of LCx 50%.  Moderate severely reduced LVEF with diffuse anterior hypokinesis and severely elevated LVEDP  Borderline cardiogenic shock, likely related to nitroglycerin administration, resolved after brief course of IV Levophed.  ACUTE COMBINED SYSTOLIC AND DIASTOLIC HEART FAILURE from acute ischemic cardiomyopathy   Treatments: surgery  1. Median Sternotomy 2. Extracorporeal circulation 3.   Emergent coronary artery bypass grafting x 2   Left internal mammary graft to the LAD  SVG to OM  4.   Endoscopic vein harvest from the right leg 5.   Insertion of left femoral arterial line.  Discharge Exam: Blood pressure 111/65, pulse 96, temperature 98.9 F (37.2 C), temperature source Oral, resp. rate (!) 23, height 4' 11.5" (1.511 m), weight 77.6 kg, SpO2 98  %.  Gen: no apparent distress Heart: RRR Lungs: CTA bilaterally Abd: soft non-tender, non-distended Ext: no edema Neuro: grossly intact  Disposition: Discharge disposition: 01-Home or Self Care        Discharge Medications:  The patient has been discharged on:   1.Beta Blocker:  Yes [   ]                              No   [  X ]                              If No, reason:hypotension  2.Ace Inhibitor/ARB: Yes [   ]                                     No  [ X   ]                                     If No, reason: hypotension  3.Statin:   Yes [ X  ]  No  [   ]                  If No, reason:  4.Ecasa:  Yes  [ X  ]                  No   [   ]                  If No, reason:     Discharge Instructions    Amb Referral to Cardiac Rehabilitation   Complete by:  As directed    Diagnosis:  CABG   CABG X ___:  2     Allergies as of 10/14/2018      Reactions   Amoxicillin Diarrhea, Nausea And Vomiting   GI intolerance.    Metoclopramide Hcl Other (See Comments)   Stroke-like symptoms (dose too high?)      Medication List    STOP taking these medications   latanoprost 0.005 % ophthalmic solution Commonly known as:  XALATAN   VITAMIN E PO     TAKE these medications   aspirin 81 MG EC tablet Take 1 tablet (81 mg total) by mouth daily.   atorvastatin 80 MG tablet Commonly known as:  LIPITOR Take 1 tablet (80 mg total) by mouth daily at 6 PM.   Calcium-Magnesium-Zinc Tabs Take 1 tablet by mouth at bedtime.   clopidogrel 75 MG tablet Commonly known as:  PLAVIX Take 1 tablet (75 mg total) by mouth daily.   multivitamin tablet Take 1 tablet by mouth daily.   omeprazole 40 MG capsule Commonly known as:  PRILOSEC Take 40 mg by mouth daily.   PROBIOTIC DAILY PO Take 1 tablet by mouth daily.   traMADol 50 MG tablet Commonly known as:  ULTRAM Take 1 tablet (50 mg total) by mouth every 4 (four) hours as needed for moderate pain.    VITAMIN C PO Take 1 tablet by mouth daily.   VITAMIN D PO Take 1 tablet by mouth daily.            Durable Medical Equipment  (From admission, onward)         Start     Ordered   10/11/18 0828  For home use only DME Walker rolling  Once    Question:  Patient needs a walker to treat with the following condition  Answer:  Physical deconditioning   10/11/18 0827         Follow-up Information    Alleen Borne, MD Follow up on 11/17/2018.   Specialty:  Cardiothoracic Surgery Why:  Appointment is at 11:00, please get CXR at 10:30 at [facility] located on first floor of our office building Contact information: [address] [place] [address] [phone]        [facility] Follow up on 10/18/2018.   Specialty:  Cardiothoracic Surgery Why:  Appointment is at 10:30 for suture removal with nurse  Contact information: [address] [place], [address] [place] [address] [phone]       [facility] Follow up.   Specialty:  Home Health Services Why:  [facility] RN and Physical Therapy arranged - they will call to set up home visits Contact information: [address] [place] [address] [phone]           Signed: Lowella Dandy 10/14/2018, 10:24 AM

## 2018-10-09 LAB — TYPE AND SCREEN
ABO/RH(D): O POS
Antibody Screen: NEGATIVE
Unit division: 0
Unit division: 0
Unit division: 0
Unit division: 0
Unit division: 0
Unit division: 0

## 2018-10-09 LAB — BPAM RBC
Blood Product Expiration Date: 202002252359
Blood Product Expiration Date: 202002272359
Blood Product Expiration Date: 202002272359
Blood Product Expiration Date: 202002272359
Blood Product Expiration Date: 202002272359
Blood Product Expiration Date: 202002272359
ISSUE DATE / TIME: 202001280945
ISSUE DATE / TIME: 202001280945
ISSUE DATE / TIME: 202001281219
ISSUE DATE / TIME: 202001281219
Unit Type and Rh: 5100
Unit Type and Rh: 5100
Unit Type and Rh: 5100
Unit Type and Rh: 5100
Unit Type and Rh: 5100
Unit Type and Rh: 5100

## 2018-10-09 LAB — CBC
HCT: 29.9 % — ABNORMAL LOW (ref 36.0–46.0)
Hemoglobin: 9.4 g/dL — ABNORMAL LOW (ref 12.0–15.0)
MCH: 29.8 pg (ref 26.0–34.0)
MCHC: 31.4 g/dL (ref 30.0–36.0)
MCV: 94.9 fL (ref 80.0–100.0)
Platelets: 88 10*3/uL — ABNORMAL LOW (ref 150–400)
RBC: 3.15 MIL/uL — ABNORMAL LOW (ref 3.87–5.11)
RDW: 13.7 % (ref 11.5–15.5)
WBC: 5.1 10*3/uL (ref 4.0–10.5)
nRBC: 0 % (ref 0.0–0.2)

## 2018-10-09 LAB — BASIC METABOLIC PANEL
Anion gap: 8 (ref 5–15)
BUN: 16 mg/dL (ref 8–23)
CO2: 27 mmol/L (ref 22–32)
Calcium: 8.3 mg/dL — ABNORMAL LOW (ref 8.9–10.3)
Chloride: 105 mmol/L (ref 98–111)
Creatinine, Ser: 0.62 mg/dL (ref 0.44–1.00)
GFR calc Af Amer: >60 mL/min (ref >60)
GFR calc non Af Amer: >60 mL/min (ref >60)
Glucose, Bld: 100 mg/dL — ABNORMAL HIGH (ref 70–99)
Potassium: 4.4 mmol/L (ref 3.5–5.1)
Sodium: 140 mmol/L (ref 135–145)

## 2018-10-09 MED ORDER — MOVING RIGHT ALONG BOOK
Freq: Once | Status: AC
  Administered 2018-10-09: 1
  Filled 2018-10-09: qty 1

## 2018-10-09 NOTE — Plan of Care (Signed)
  Problem: Activity: Goal: Risk for activity intolerance will decrease Outcome: Progressing   Problem: Cardiac: Goal: Will achieve and/or maintain hemodynamic stability Outcome: Progressing   Problem: Clinical Measurements: Goal: Postoperative complications will be avoided or minimized Outcome: Progressing   Problem: Respiratory: Goal: Respiratory status will improve Outcome: Progressing   Problem: Skin Integrity: Goal: Wound healing without signs and symptoms of infection Outcome: Progressing   Problem: Urinary Elimination: Goal: Ability to achieve and maintain adequate renal perfusion and functioning will improve Outcome: Progressing   Problem: Clinical Measurements: Goal: Ability to maintain clinical measurements within normal limits will improve Outcome: Progressing Goal: Will remain free from infection Outcome: Progressing Goal: Diagnostic test results will improve Outcome: Progressing Goal: Respiratory complications will improve Outcome: Progressing

## 2018-10-09 NOTE — Progress Notes (Signed)
[facility] BRIEF SICU PROGRESS NOTE  4 Days Post-Op  S/P Procedure(s) (LRB): CORONARY ARTERY BYPASS GRAFTING (CABG)x 2,using left internal mammary artery and right leg greater saphenous vein harvested endoscopically (N/A)   Stable day  Plan: Continue current plan  Rexene Alberts, MD 10/09/2018 6:12 PM

## 2018-10-09 NOTE — Progress Notes (Signed)
      [address]       [place] [address]             [phone]        CARDIOTHORACIC SURGERY PROGRESS NOTE   R4 Days Post-Op Procedure(s) (LRB): CORONARY ARTERY BYPASS GRAFTING (CABG)x 2,using left internal mammary artery and right leg greater saphenous vein harvested endoscopically (N/A)  Subjective: Feels pretty well.  Ambulated fairly well this morning.  Nos SOB.  No nausea.  Minimal soreness in chest.  Had an episode tachycardia and nausea last night when IV lasix was pushed through line running dopamine but symptoms resolved once dopamine put on hold for a period of time.  No further episodes since.  Objective: Vital signs: BP Readings from Last 1 Encounters:  10/09/18 130/64   Pulse Readings from Last 1 Encounters:  10/09/18 (!) 107   Resp Readings from Last 1 Encounters:  10/09/18 20   Temp Readings from Last 1 Encounters:  10/09/18 98.3 F (36.8 C)    Hemodynamics:    Physical Exam:  Rhythm:   sinus  Breath sounds: clear  Heart sounds:  RRR  Incisions:  Clean and dry  Abdomen:  Soft, non-distended, non-tender  Extremities:  Warm, well-perfused, swollen   Intake/Output from previous day: 01/31 0701 - 02/01 0700 In: 668.6 [P.O.:300; I.V.:194.5; IV Piggyback:174.1] Out: 3950 [Urine:3950] Intake/Output this shift: No intake/output data recorded.  Lab Results:  CBC: Recent Labs    10/08/18 0618 10/09/18 0527  WBC 7.1 5.1  HGB 9.0* 9.4*  HCT 28.2* 29.9*  PLT 69* 88*    BMET:  Recent Labs    10/08/18 0618 10/09/18 0527  NA 141 140  K 4.1 4.4  CL 107 105  CO2 28 27  GLUCOSE 91 100*  BUN 14 16  CREATININE 0.63 0.62  CALCIUM 8.2* 8.3*     PT/INR:  No results for input(s): LABPROT, INR in the last 72 hours.  CBG (last 3)  Recent Labs    10/07/18 1101 10/07/18 1607 10/07/18 1936  GLUCAP 86 79 69*    ABG    Component Value Date/Time   PHART 7.347 (L) 10/06/2018 1209   PCO2ART 41.1 10/06/2018 1209   PO2ART  84.0 10/06/2018 1209   HCO3 22.5 10/06/2018 1209   TCO2 24 10/06/2018 1209   ACIDBASEDEF 3.0 (H) 10/06/2018 1209   O2SAT 81.1 10/07/2018 0525    CXR: n/a  Assessment/Plan: S/P Procedure(s) (LRB): CORONARY ARTERY BYPASS GRAFTING (CABG)x 2,using left internal mammary artery and right leg greater saphenous vein harvested endoscopically (N/A)  Overall doing well POD4 Maintaining NSR with stable BP on low dose milrinone and dopamine Breathing comfortably w/ O2 sats 100%    Stop milrinone  Wean Dopamine as tolerated  Mobilize  Gentle diuresis       Rexene Alberts, MD 10/09/2018 9:42 AM

## 2018-10-09 NOTE — Evaluation (Addendum)
Physical Therapy Evaluation Patient Details Name: Molly Chandler MRN: [id number] DOB: [birth year] Today's Date: 10/09/2018   History of Present Illness  Pt is a 84 y.o. F with significant PMH of rectal CA and osteoporosis s/p coronary artery bypass grafting x 2  Clinical Impression  Pt admitted with above diagnosis. On PT evaluation, pt requiring moderate assistance for transfers and ambulating 256 feet with Eva walker and min assist. HR peak at 111 bpm, SpO2 100% on 3L O2. Verbally reviewed sternal precautions and provided written handout. Suspect pt will progress well but will need continued reinforcement of precautions. Recommending HHPT at discharge.     Follow Up Recommendations Home health PT;Supervision/Assistance - 24 hour    Equipment Recommendations  None recommended by PT (well equipped)   Recommendations for Other Services OT consult     Precautions / Restrictions Precautions Precautions: Sternal;Fall Precaution Booklet Issued: Yes (comment) Precaution Comments: verbally reviewed and provided written handout with pt and pt daughter Restrictions Weight Bearing Restrictions: Yes Other Position/Activity Restrictions: sternal precautions      Mobility  Bed Mobility Overal bed mobility: Needs Assistance Bed Mobility: Supine to Sit;Sit to Supine     Supine to sit: Min assist Sit to supine: Mod assist   General bed mobility comments: minA for trunk assist out of bed and modA for BLE negotiation back into bed  Transfers Overall transfer level: Needs assistance   Transfers: Sit to/from Stand Sit to Stand: Mod assist         General transfer comment: cues to maintain sternal precautions and rocking forward to gain momentum. modA to boost up  Ambulation/Gait Ambulation/Gait assistance: Min Chemical engineer (Feet): 256 Feet Assistive device: (eva walker) Gait Pattern/deviations: Step-through pattern;Drifts right/left;Decreased stride length;Trunk  flexed Gait velocity: decr   General Gait Details: cues for looking up  Stairs            Wheelchair Mobility    Modified Rankin (Stroke Patients Only)       Balance Overall balance assessment: Needs assistance Sitting-balance support: Feet supported Sitting balance-Leahy Scale: Good     Standing balance support: Bilateral upper extremity supported Standing balance-Leahy Scale: Fair                               Pertinent Vitals/Pain Pain Assessment: Faces Faces Pain Scale: Hurts a little bit Pain Location: sternal Pain Descriptors / Indicators: Operative site guarding Pain Intervention(s): Monitored during session    Home Living Family/patient expects to be discharged to:: Private residence Living Arrangements: Children(daughter) Available Help at Discharge: Family;Available 24 hours/day Type of Home: House Home Access: Ramped entrance     Home Layout: Able to live on main level with bedroom/bathroom Home Equipment: Walker - 2 wheels;Walker - 4 wheels;Hospital bed;Other (comment)(hoyer lift ) Additional Comments: Pt has a lot of DME from late husband    Prior Function Level of Independence: Independent         Comments: Has 2 dogs     Hand Dominance        Extremity/Trunk Assessment   Upper Extremity Assessment Upper Extremity Assessment: Overall WFL for tasks assessed    Lower Extremity Assessment Lower Extremity Assessment: Overall WFL for tasks assessed    Cervical / Trunk Assessment Cervical / Trunk Assessment: Kyphotic  Communication   Communication: No difficulties  Cognition Arousal/Alertness: Awake/alert Behavior During Therapy: WFL for tasks assessed/performed Overall Cognitive Status: Within Functional Limits for tasks assessed  General Comments      Exercises     Assessment/Plan    PT Assessment Patient needs continued PT services  PT Problem List  Decreased strength;Decreased activity tolerance;Decreased balance;Decreased mobility;Decreased knowledge of precautions       PT Treatment Interventions DME instruction;Gait training;Stair training;Functional mobility training;Therapeutic activities;Therapeutic exercise;Balance training;Patient/family education    PT Goals (Current goals can be found in the Care Plan section)  Acute Rehab PT Goals Patient Stated Goal: "go home." PT Goal Formulation: With patient Time For Goal Achievement: 10/23/18 Potential to Achieve Goals: Good    Frequency Min 3X/week   Barriers to discharge        Co-evaluation               AM-PAC PT "6 Clicks" Mobility  Outcome Measure Help needed turning from your back to your side while in a flat bed without using bedrails?: None Help needed moving from lying on your back to sitting on the side of a flat bed without using bedrails?: A Lot Help needed moving to and from a bed to a chair (including a wheelchair)?: A Little Help needed standing up from a chair using your arms (e.g., wheelchair or bedside chair)?: A Lot Help needed to walk in hospital room?: None Help needed climbing 3-5 steps with a railing? : A Lot 6 Click Score: 17    End of Session Equipment Utilized During Treatment: Oxygen Activity Tolerance: Patient tolerated treatment well Patient left: in bed;with call bell/phone within reach;with family/visitor present Nurse Communication: Mobility status PT Visit Diagnosis: Unsteadiness on feet (R26.81);Difficulty in walking, not elsewhere classified (R26.2)    Time: 1130-1155 PT Time Calculation (min) (ACUTE ONLY): 25 min   Charges:   PT Evaluation $PT Eval Moderate Complexity: 1 Mod PT Treatments $Gait Training: 8-22 mins       Laurina Bustle, PT, DPT Acute Rehabilitation Services Pager [phone] Office [phone]   Vanetta Mulders 10/09/2018, 4:03 PM

## 2018-10-10 ENCOUNTER — Inpatient Hospital Stay (HOSPITAL_COMMUNITY): Payer: Medicare Other

## 2018-10-10 LAB — CBC
HCT: 34.1 % — ABNORMAL LOW (ref 36.0–46.0)
Hemoglobin: 11.2 g/dL — ABNORMAL LOW (ref 12.0–15.0)
MCH: 30.9 pg (ref 26.0–34.0)
MCHC: 32.8 g/dL (ref 30.0–36.0)
MCV: 94.2 fL (ref 80.0–100.0)
Platelets: 125 10*3/uL — ABNORMAL LOW (ref 150–400)
RBC: 3.62 MIL/uL — ABNORMAL LOW (ref 3.87–5.11)
RDW: 13.6 % (ref 11.5–15.5)
WBC: 5 10*3/uL (ref 4.0–10.5)
nRBC: 0 % (ref 0.0–0.2)

## 2018-10-10 LAB — BASIC METABOLIC PANEL
Anion gap: 10 (ref 5–15)
BUN: 14 mg/dL (ref 8–23)
CO2: 25 mmol/L (ref 22–32)
Calcium: 8.8 mg/dL — ABNORMAL LOW (ref 8.9–10.3)
Chloride: 105 mmol/L (ref 98–111)
Creatinine, Ser: 0.7 mg/dL (ref 0.44–1.00)
GFR calc Af Amer: >60 mL/min (ref >60)
GFR calc non Af Amer: >60 mL/min (ref >60)
Glucose, Bld: 93 mg/dL (ref 70–99)
Potassium: 4.4 mmol/L (ref 3.5–5.1)
Sodium: 140 mmol/L (ref 135–145)

## 2018-10-10 MED ORDER — ENOXAPARIN SODIUM 30 MG/0.3ML ~~LOC~~ SOLN
30.0000 mg | SUBCUTANEOUS | Status: DC
  Administered 2018-10-10 – 2018-10-13 (×4): 30 mg via SUBCUTANEOUS
  Filled 2018-10-10 (×4): qty 0.3

## 2018-10-10 MED ORDER — ATORVASTATIN CALCIUM 80 MG PO TABS
80.0000 mg | ORAL_TABLET | Freq: Every day | ORAL | Status: DC
  Administered 2018-10-10 – 2018-10-13 (×4): 80 mg via ORAL
  Filled 2018-10-10 (×4): qty 1

## 2018-10-10 MED ORDER — CLOPIDOGREL BISULFATE 75 MG PO TABS
75.0000 mg | ORAL_TABLET | Freq: Every day | ORAL | Status: DC
  Administered 2018-10-10 – 2018-10-14 (×5): 75 mg via ORAL
  Filled 2018-10-10 (×5): qty 1

## 2018-10-10 MED ORDER — MIDAZOLAM HCL 2 MG/2ML IJ SOLN
INTRAMUSCULAR | Status: AC
  Filled 2018-10-10: qty 2

## 2018-10-10 MED ORDER — ASPIRIN EC 81 MG PO TBEC
81.0000 mg | DELAYED_RELEASE_TABLET | Freq: Every day | ORAL | Status: DC
  Administered 2018-10-11 – 2018-10-14 (×4): 81 mg via ORAL
  Filled 2018-10-10 (×4): qty 1

## 2018-10-10 MED ORDER — METOPROLOL TARTRATE 12.5 MG HALF TABLET
12.5000 mg | ORAL_TABLET | Freq: Two times a day (BID) | ORAL | Status: DC
  Administered 2018-10-10 – 2018-10-11 (×3): 12.5 mg via ORAL
  Filled 2018-10-10 (×3): qty 1

## 2018-10-10 NOTE — Progress Notes (Signed)
      [address]       [place] [address]             [phone]        CARDIOTHORACIC SURGERY PROGRESS NOTE   R5 Days Post-Op Procedure(s) (LRB): CORONARY ARTERY BYPASS GRAFTING (CABG)x 2,using left internal mammary artery and right leg greater saphenous vein harvested endoscopically (N/A)  Subjective: Looks good and feels well, although complains that she didn't sleep any last night.  Ambulated twice already this morning.  No SOB.  Appetite improved  Objective: Vital signs: BP Readings from Last 1 Encounters:  10/10/18 (!) 122/47   Pulse Readings from Last 1 Encounters:  10/10/18 78   Resp Readings from Last 1 Encounters:  10/10/18 (!) 0   Temp Readings from Last 1 Encounters:  10/10/18 (!) 97.5 F (36.4 C) (Oral)    Hemodynamics:    Physical Exam:  Rhythm:   sinus  Breath sounds: clear  Heart sounds:  RRR  Incisions:  Clean and dry  Abdomen:  Soft, non-distended, non-tender  Extremities:  Warm, well-perfused    Intake/Output from previous day: 02/01 0701 - 02/02 0700 In: 412.1 [P.O.:395; I.V.:17.1] Out: 4700 [Urine:4700] Intake/Output this shift: No intake/output data recorded.  Lab Results:  CBC: Recent Labs    10/09/18 0527 10/10/18 0723  WBC 5.1 5.0  HGB 9.4* 11.2*  HCT 29.9* 34.1*  PLT 88* 125*    BMET:  Recent Labs    10/09/18 0527 10/10/18 0723  NA 140 140  K 4.4 4.4  CL 105 105  CO2 27 25  GLUCOSE 100* 93  BUN 16 14  CREATININE 0.62 0.70  CALCIUM 8.3* 8.8*     PT/INR:  No results for input(s): LABPROT, INR in the last 72 hours.  CBG (last 3)  Recent Labs    10/07/18 1101 10/07/18 1607 10/07/18 1936  GLUCAP 86 79 69*    ABG    Component Value Date/Time   PHART 7.347 (L) 10/06/2018 1209   PCO2ART 41.1 10/06/2018 1209   PO2ART 84.0 10/06/2018 1209   HCO3 22.5 10/06/2018 1209   TCO2 24 10/06/2018 1209   ACIDBASEDEF 3.0 (H) 10/06/2018 1209   O2SAT 81.1 10/07/2018 0525    CXR: CHEST - 2  VIEW  COMPARISON:  10/08/2018  FINDINGS: Mild left basilar opacity, likely a combination of atelectasis and small left pleural effusion. Right lung is clear. No frank interstitial edema. No pneumothorax.  Heart is top-normal in size. Postsurgical changes related to prior CABG.  Median sternotomy.  Mild degenerative changes of the visualized thoracolumbar spine.  IMPRESSION: Mild left basilar opacity, likely combination of atelectasis and small left pleural effusion.   Electronically Signed   By: Julian Hy M.D.   On: 10/10/2018 06:42   Assessment/Plan: S/P Procedure(s) (LRB): CORONARY ARTERY BYPASS GRAFTING (CABG)x 2,using left internal mammary artery and right leg greater saphenous vein harvested endoscopically (N/A)  Doing well POD5 Maintaining NSR w/ stable BP off drips Breathing comfortably w/ O2 sats 95-100% on 2 L/min, CXR looks good Acute systolic CHF with expected post-op volume excess, diuresing very well but weight still 8 kg > preop Expected post op acute blood loss anemia, mild, improved Expected post op atelectasis, mild Brief episodes post op Afib, maintaining NSR on oral amiodarone   Mobilize  Diuresis  Start low dose beta blocker  DAPT  Statin  Transfer 4E   Rexene Alberts, MD 10/10/2018 10:30 AM

## 2018-10-10 NOTE — Evaluation (Signed)
Occupational Therapy Evaluation Patient Details Name: Molly Chandler MRN: [id number] DOB: [birth year] Today's Date: 10/10/2018    History of Present Illness Pt is a 84 y.o. F with significant PMH of rectal CA and osteoporosis s/p coronary artery bypass grafting x 2   Clinical Impression   PTA, pt was living with her daughter and was independently. Pt currently requiring Mod A for UB ADLs, Mod-Max A for LB ADLs, and Min Guard-Min A for functional mobility with RW. Providing pt and family with education on sternal precautions and compensatory techniques for UB ADLs, LB ADLs, toileting, and functional transfers. VSS throughout. Pt would benefit from further acute OT to address AE, educate on compensatory techniques for ADLs, and facilitate safe dc. Recommend dc to home with HHOT for further OT to optimize safety, independence with ADLs, and return to PLOF.      Follow Up Recommendations  Home health OT;Supervision/Assistance - 24 hour    Equipment Recommendations  None recommended by OT    Recommendations for Other Services PT consult     Precautions / Restrictions Precautions Precautions: Sternal;Fall Precaution Booklet Issued: Yes (comment) Precaution Comments: verbally reviewed and provided written handout with pt and pt daughter Restrictions Weight Bearing Restrictions: (Sternal precautions.) Other Position/Activity Restrictions: sternal precautions      Mobility Bed Mobility               General bed mobility comments: In recliner upon arrival  Transfers Overall transfer level: Needs assistance Equipment used: None Transfers: Sit to/from Stand Sit to Stand: Min assist         General transfer comment: Min A for power up and safety    Balance Overall balance assessment: Needs assistance Sitting-balance support: Feet supported Sitting balance-Leahy Scale: Good     Standing balance support: Bilateral upper extremity supported Standing balance-Leahy  Scale: Fair                             ADL either performed or assessed with clinical judgement   ADL Overall ADL's : Needs assistance/impaired Eating/Feeding: Supervision/ safety;Set up;Sitting   Grooming: Wash/dry hands;Min guard;Standing Grooming Details (indicate cue type and reason): Hand hygiene after toileting Upper Body Bathing: Minimal assistance;Sitting   Lower Body Bathing: Moderate assistance;Sit to/from stand   Upper Body Dressing : Moderate assistance;Sitting   Lower Body Dressing: Maximal assistance;Sit to/from stand Lower Body Dressing Details (indicate cue type and reason): Pt will need education on AE for LB ADLs Toilet Transfer: Minimal assistance;Ambulation;Regular Teacher, adult education Details (indicate cue type and reason): Min A for power up from toilet Toileting- Clothing Manipulation and Hygiene: Min guard;Sitting/lateral lean Toileting - Clothing Manipulation Details (indicate cue type and reason): Pt able to laterally lean in toielt for front peri care after urination. Initated discussion on toielt aide. Will need further education     Functional mobility during ADLs: Min guard;Rolling walker General ADL Comments: Providing education on sternal precautions and compensatory techniques for ADLs. Initated education on UB dressing, LB dressing, and toileting. Pt will require further education     Vision         Perception     Praxis      Pertinent Vitals/Pain Pain Assessment: Faces Faces Pain Scale: Hurts a little bit Pain Location: sternal Pain Descriptors / Indicators: Operative site guarding Pain Intervention(s): Monitored during session;Limited activity within patient's tolerance;Repositioned     Hand Dominance Right   Extremity/Trunk Assessment Upper Extremity Assessment Upper Extremity  Assessment: Overall WFL for tasks assessed   Lower Extremity Assessment Lower Extremity Assessment: Overall WFL for tasks assessed    Cervical / Trunk Assessment Cervical / Trunk Assessment: Kyphotic;Other exceptions Cervical / Trunk Exceptions: s/p CABG   Communication Communication Communication: No difficulties   Cognition Arousal/Alertness: Awake/alert Behavior During Therapy: WFL for tasks assessed/performed Overall Cognitive Status: Within Functional Limits for tasks assessed                                     General Comments  HR 101. SpO2 98% on RA. RR 20s.    Exercises     Shoulder Instructions      Home Living Family/patient expects to be discharged to:: Private residence Living Arrangements: Children(daughter) Available Help at Discharge: Family;Available 24 hours/day Type of Home: House Home Access: Ramped entrance     Home Layout: Able to live on main level with bedroom/bathroom     Bathroom Shower/Tub: Chief Strategy Officer: Standard     Home Equipment: Environmental consultant - 2 wheels;Walker - 4 wheels;Hospital bed;Other (comment);Bedside commode;Tub bench;Adaptive equipment(hoyer lift ) Adaptive Equipment: Reacher Additional Comments: Pt has a lot of DME from late husband      Prior Functioning/Environment Level of Independence: Independent        Comments: Has 2 dogs        OT Problem List: Decreased strength;Decreased range of motion;Decreased activity tolerance;Impaired balance (sitting and/or standing);Decreased knowledge of use of DME or AE;Decreased knowledge of precautions;Pain      OT Treatment/Interventions: Self-care/ADL training;Therapeutic exercise;Energy conservation;DME and/or AE instruction;Therapeutic activities;Patient/family education    OT Goals(Current goals can be found in the care plan section) Acute Rehab OT Goals Patient Stated Goal: "go home." OT Goal Formulation: With patient Time For Goal Achievement: 10/24/18 Potential to Achieve Goals: Good  OT Frequency: Min 3X/week   Barriers to D/C:            Co-evaluation               AM-PAC OT "6 Clicks" Daily Activity     Outcome Measure Help from another person eating meals?: None Help from another person taking care of personal grooming?: A Little Help from another person toileting, which includes using toliet, bedpan, or urinal?: A Lot Help from another person bathing (including washing, rinsing, drying)?: A Lot Help from another person to put on and taking off regular upper body clothing?: A Lot Help from another person to put on and taking off regular lower body clothing?: A Lot 6 Click Score: 15   End of Session Equipment Utilized During Treatment: Rolling walker Nurse Communication: Mobility status;Precautions  Activity Tolerance: Patient tolerated treatment well Patient left: in chair;with call bell/phone within reach;with family/visitor present  OT Visit Diagnosis: Unsteadiness on feet (R26.81);Other abnormalities of gait and mobility (R26.89);Muscle weakness (generalized) (M62.81);Pain Pain - part of body: (Chest)                Time: 0929-1000 OT Time Calculation (min): 31 min Charges:  OT General Charges $OT Visit: 1 Visit OT Evaluation $OT Eval Moderate Complexity: 1 Mod OT Treatments $Self Care/Home Management : 8-22 mins  [place] MSOT, OTR/L Acute Rehab Pager: [phone] Office: [phone]  Theodoro Grist Molly Chandler 10/10/2018, 12:00 PM

## 2018-10-11 ENCOUNTER — Inpatient Hospital Stay (HOSPITAL_COMMUNITY): Payer: Medicare Other

## 2018-10-11 DIAGNOSIS — I255 Ischemic cardiomyopathy: Secondary | ICD-10-CM

## 2018-10-11 DIAGNOSIS — I4891 Unspecified atrial fibrillation: Secondary | ICD-10-CM

## 2018-10-11 HISTORY — PX: TRANSTHORACIC ECHOCARDIOGRAM: SHX275

## 2018-10-11 LAB — GLUCOSE, CAPILLARY: Glucose-Capillary: 105 mg/dL — ABNORMAL HIGH (ref 70–99)

## 2018-10-11 LAB — CBC
HCT: 34.9 % — ABNORMAL LOW (ref 36.0–46.0)
Hemoglobin: 11.7 g/dL — ABNORMAL LOW (ref 12.0–15.0)
MCH: 31 pg (ref 26.0–34.0)
MCHC: 33.5 g/dL (ref 30.0–36.0)
MCV: 92.3 fL (ref 80.0–100.0)
Platelets: 175 10*3/uL (ref 150–400)
RBC: 3.78 MIL/uL — ABNORMAL LOW (ref 3.87–5.11)
RDW: 13.4 % (ref 11.5–15.5)
WBC: 6.2 10*3/uL (ref 4.0–10.5)
nRBC: 0 % (ref 0.0–0.2)

## 2018-10-11 LAB — ECHOCARDIOGRAM LIMITED
Height: 59.5 in
Weight: 2846.58 oz

## 2018-10-11 LAB — TROPONIN I
Troponin I: 1.07 ng/mL (ref <0.03)
Troponin I: 0.92 ng/mL (ref <0.03)

## 2018-10-11 MED ORDER — CARVEDILOL 3.125 MG PO TABS
3.1250 mg | ORAL_TABLET | Freq: Two times a day (BID) | ORAL | Status: DC
  Administered 2018-10-11 (×2): 3.125 mg via ORAL
  Filled 2018-10-11 (×3): qty 1

## 2018-10-11 MED ORDER — SODIUM CHLORIDE 0.9 % IV BOLUS
500.0000 mL | Freq: Once | INTRAVENOUS | Status: AC
  Administered 2018-10-11: 500 mL via INTRAVENOUS

## 2018-10-11 MED ORDER — FUROSEMIDE 40 MG PO TABS
40.0000 mg | ORAL_TABLET | Freq: Every day | ORAL | Status: DC
  Administered 2018-10-11: 40 mg via ORAL
  Filled 2018-10-11: qty 1

## 2018-10-11 NOTE — Progress Notes (Addendum)
Contact via nursing that patient is not doing as well as she was this morning.  Nursing states she walked the unit 3 times today.  Got up to go the bathroom and developed nausea, weakness, and dizziness.  Overall she states she just didn't feel well.  She denies straining to have a bowel movement.  Her pacing wires were removed this morning at 8:30.   Stat Bedside Echo- negative for Tamponade  BP was low in the 80s, will give 500 ml of saline bolus as she did receive lasix earlier today.  Repeat BP was improved around 100.   Exam:  Gen: no apparent distress, however patient looks pale and diaphoretic Heart: RRR Lungs: CTA bilaterally Pacing Wires- sites are clean    A/P:  1. Will get EKG, check Enzymes 2. Hypotension- will give NS bolus 3. Repeat CBC to ensure no acute drop hemoglobin 4. Repeat labs in AM 5. Transfer to ICU for closer observation  Ellwood Handler, PA-C   Chart reviewed, patient examined, agree with above. I have personally reviewed her echo while it was being done. I don't see any significant pericardial effusion. There is no compression of RA or RV. LV function looks normal. No significant MR. Small left pleural effusion. I am not completely sure why she started feeling poorly this afternoon and felt like she was going to pass out while walking. She may have gotten orthostatic from over-diuresis. I think it would be best to observe in the ICU for now. Will check ECG and enzymes, repeat labs. She has had persistent nausea which may be due to amiodarone. Will stop it.  Discussed with daughter at bedside.

## 2018-10-11 NOTE — Progress Notes (Addendum)
Progress Note  Patient Name: Molly Chandler Date of Encounter: 10/11/2018  Primary Cardiologist: Dr. Ellyn Hack, MD-New   Subjective   Pt tired today. Reports poor sleep for two nights. Anticipate DC tomorrow. Will set up OP appointment with our service.   Inpatient Medications    Scheduled Meds: . amiodarone  200 mg Oral BID  . aspirin EC  81 mg Oral Daily  . atorvastatin  80 mg Oral q1800  . bisacodyl  10 mg Oral Daily   Or  . bisacodyl  10 mg Rectal Daily  . Chlorhexidine Gluconate Cloth  6 each Topical Daily  . clopidogrel  75 mg Oral Daily  . docusate sodium  200 mg Oral Daily  . enoxaparin (LOVENOX) injection  30 mg Subcutaneous Q24H  . furosemide  40 mg Oral Daily  . latanoprost  1 drop Both Eyes QHS  . mouth rinse  15 mL Mouth Rinse BID  . metoprolol tartrate  12.5 mg Oral BID  . pantoprazole  40 mg Oral Daily  . potassium chloride  20 mEq Oral BID  . sodium chloride flush  10-40 mL Intracatheter Q12H  . sodium chloride flush  3 mL Intravenous Q12H   Continuous Infusions: . sodium chloride    . lactated ringers 10 mL/hr at 10/08/18 1842  . lactated ringers 10 mL/hr at 10/08/18 1743  . potassium chloride Stopped (10/08/18 2232)   PRN Meds: levalbuterol, morphine injection, ondansetron (ZOFRAN) IV, oxyCODONE, potassium chloride, sodium chloride flush, sodium chloride flush, traMADol   Vital Signs    Vitals:   10/11/18 0506 10/11/18 0534 10/11/18 0815 10/11/18 0838  BP: 124/64  (!) 111/58 (!) 121/56  Pulse: (!) 38     Resp: 16 17    Temp:   98.2 F (36.8 C)   TempSrc:   Oral   SpO2: 98%     Weight:  80.7 kg    Height:        Intake/Output Summary (Last 24 hours) at 10/11/2018 1008 Last data filed at 10/10/2018 1900 Gross per 24 hour  Intake 240 ml  Output 400 ml  Net -160 ml   Filed Weights   10/09/18 0500 10/10/18 0600 10/11/18 0534  Weight: 85.9 kg 82.9 kg 80.7 kg    Physical Exam   General: Well developed, well nourished, NAD Skin:  Warm, dry, intact. Midline anterior chest incision with on oozing or redness   Head: Normocephalic, atraumatic, clear, moist mucus membranes. Neck: Negative for carotid bruits. No JVD Lungs:Clear to ausculation bilaterally. No wheezes, rales, or rhonchi. Breathing is unlabored. Cardiovascular: RRR with S1 S2. No murmurs, rubs, gallops, or LV heave appreciated. Abdomen: Soft, non-tender, non-distended with normoactive bowel sounds. No obvious abdominal masses. MSK: Strength and tone appear normal for age. 5/5 in all extremities Extremities: No edema. No clubbing or cyanosis. DP/PT pulses 2+ bilaterally Neuro: Alert and oriented. No focal deficits. No facial asymmetry. MAE spontaneously. Psych: Responds to questions appropriately with normal affect.    Labs    Chemistry Recent Labs  Lab 10/05/18 0334 10/05/18 0708  10/08/18 0618 10/09/18 0527 10/10/18 0723  NA 139 138   < > 141 140 140  K 3.5 3.7   < > 4.1 4.4 4.4  CL 107 108   < > 107 105 105  CO2 19* 18*   < > 28 27 25   GLUCOSE 162* 130*   < > 91 100* 93  BUN 25* 24*   < > 14 16 14  CREATININE 1.00 0.81   < > 0.63 0.62 0.70  CALCIUM 8.5* 8.0*   < > 8.2* 8.3* 8.8*  PROT 6.0* 5.6*  --   --   --   --   ALBUMIN 3.4* 3.1*  --   --   --   --   AST 20 58*  --   --   --   --   ALT 13 18  --   --   --   --   ALKPHOS 42 41  --   --   --   --   BILITOT 1.0 0.8  --   --   --   --   GFRNONAA 53* >60   < > >60 >60 >60  GFRAA >60 >60   < > >60 >60 >60  ANIONGAP 13 12   < > 6 8 10    < > = values in this interval not displayed.    Hematology Recent Labs  Lab 10/08/18 0618 10/09/18 0527 10/10/18 0723  WBC 7.1 5.1 5.0  RBC 2.97* 3.15* 3.62*  HGB 9.0* 9.4* 11.2*  HCT 28.2* 29.9* 34.1*  MCV 94.9 94.9 94.2  MCH 30.3 29.8 30.9  MCHC 31.9 31.4 32.8  RDW 13.8 13.7 13.6  PLT 69* 88* 125*   Cardiac Enzymes Recent Labs  Lab 10/05/18 0334 10/05/18 0708  TROPONINI 0.05* 14.05*   No results for input(s): TROPIPOC in the last 168 hours.    BNPNo results for input(s): BNP, PROBNP in the last 168 hours.   DDimer No results for input(s): DDIMER in the last 168 hours.   Radiology    Dg Chest 2 View  Result Date: 10/10/2018 CLINICAL DATA:  Congestive heart failure EXAM: CHEST - 2 VIEW COMPARISON:  10/08/2018 FINDINGS: Mild left basilar opacity, likely a combination of atelectasis and small left pleural effusion. Right lung is clear. No frank interstitial edema. No pneumothorax. Heart is top-normal in size. Postsurgical changes related to prior CABG. Median sternotomy. Mild degenerative changes of the visualized thoracolumbar spine. IMPRESSION: Mild left basilar opacity, likely combination of atelectasis and small left pleural effusion. Electronically Signed   By: Julian Hy M.D.   On: 10/10/2018 06:42    Telemetry    10/11/2018 NSR - Personally Reviewed  ECG    No new tracing as of 10/11/2018- Personally Reviewed  Cardiac Studies   Cath 10/05/2018:   CULPRIT LESION: Dist LM to Ost LAD lesion is 95% stenosed. Ost Cx lesion is 50% stenosed.  There is moderate to severe left ventricular systolic dysfunction. The left ventricular ejection fraction is 25-35% by visual estimate -dense anterior wall motion and normality  LV end diastolic pressure is severely elevated.  There is no aortic valve stenosis. There is mild (2+) mitral regurgitation.   SUMMARY   Severe distal left main-ostial LAD 95% stenosis involving ostium of LCx 50%.  Moderate severely reduced LVEF with diffuse anterior hypokinesis and severely elevated LVEDP  Borderline cardiogenic shock, likely related to nitroglycerin administration, resolved after brief course of IV Levophed.  ACUTE COMBINED SYSTOLIC AND DIASTOLIC HEART FAILURE from acute ischemic cardiomyopathy    RECOMMENDATIONS  Based on location of stenosis, best course of action is for CABG.  After discussion with Dr. Roxy Manns, the plan will be to stabilize her temporarily in the CVICU to  allow time for the OR staff arrival.  She will then be taken for emergent CABG with IABP pump in place.  Echocardiogram is been ordered for postop  High-dose statin  ordered.  Titration medications postop as tolerated.   TEE 10/05/2018:  Mitral valve: Trace regurgitation.  Right ventricle: Normal wall thickness and ejection fraction. Cavity is mildly dilated.  Tricuspid valve: Moderate regurgitation. The tricuspid valve regurgitation jet is central.   Patient Profile     84 y.o. female with no prior hx of CAD who presented to [facility] with anterior STEMI who underwent emergent CABG on 10/05/2018.    Assessment & Plan    1. CAD s/p emergent CABG: -Pt presented to [facility] on 10/05/2018 with anterior STEMI found to have distal LM to ost LAD disease who was taken emergently for CABG revascularization  -Had IABP in place>>groin site stable  -Required Neo for a period of time post operative  -Pt placed on Amio 200mg  PO BID after brief runs of post-operative SVT/AF>>will need to decrease to 200mg  daily at OP follow up appointment  -Continue ASA, Plavix, high intensity statin,  Lasix 40mg  daily  -Will transition metoprolol  to carvedilol 3.125mg   -No ACE in the setting of hypotension requiring neo in the post operative setting  -Will need LFT and lipid panel in 6-8 weeks   2. Post operative atrial fibrillation: -Brief post-operative atrial fibrillation>>placed on Amiodarone 200mg  BID -NSR today with no recurrent AF   Signed, Kathyrn Drown NP-C [facility] Pager: [phone] 10/11/2018, 10:08 AM     Patient seen and examined. Agree with assessment and plan. Complains of mild nausea today. No chest pain. Lungs clear. No JVD. RRR 1/6 systolic murmur.  No significant edema. With reduced EF agree with transition to carvedilol. Continues on amiodarone. Tentatively DC planned for tomorrow.  Will see again tomorow prior to DC for further recommendations.   Troy Sine, MD,  [facility] 10/11/2018 11:01 AM  For questions or updates, please contact   Please consult [email or web address] for contact info under Cardiology/STEMI.

## 2018-10-11 NOTE — Progress Notes (Signed)
CARDIAC REHAB PHASE I   PRE:  Rate/Rhythm: 76 SR  BP:  Sitting: 110/56      SaO2: 95 RA  MODE:  Ambulation: 300 ft   POST:  Rate/Rhythm: 75 SR  BP:  Sitting: 117/97    SaO2: 94 RA   Pt helped to BR, then ambulated 365ft in hallway assist of one with rolling walker. Pt took several standing rest breaks c/o SOB and feeling tired. Upon return to room, pt to chair, then started to c/o nausea. Pt helped back into bed, RN aware. Call bell within reach, daughter at bedside. Will continue to follow.  [phone] Rufina Falco, RN BSN 10/11/2018 11:12 AM

## 2018-10-11 NOTE — Progress Notes (Signed)
  Echocardiogram 2D Echocardiogram has been performed.  Molly Chandler 10/11/2018, 2:37 PM

## 2018-10-11 NOTE — Progress Notes (Addendum)
Pt began feeling weak while sitting on BSC. Pt states she feels "like I'm about to pass out". Denies having to strain to have a BM. Did not eat much of breakfast or lunch.   1355 BP 108/49. HR 73, regular. O2 95%. CBG 105.   Brooke Therapist, sports called [employer] PA.  1400 BP 84/44 1401 BP 83/54  Erin Barrett at bedside. Verbal orders for STAT echo and normal saline 518mL at 100 mL/hr.  1410 Dr. Cyndia Bent and Kelli Churn RRT at bedside. 1412 Echo at bedside.   1416 103/52 HR 74 O2 97% RA  Plan to move to 2H. Pt and daughter updated regarding plan of care.   Fritz Pickerel, RN

## 2018-10-11 NOTE — Progress Notes (Signed)
Called to patient's bedside because of sudden hypotensive episode while on BSC.  Upon my arrival patient lying in the bed.  She is pale, cool and clammy.  BP 83/54  HR 73.  Dr Cyndia Bent at bedside. 2d Echo in process.  Per Dr Cyndia Bent no tamponade.  BP improved to 103/52  HR 67.  Plan: move pt to ICU.

## 2018-10-11 NOTE — Plan of Care (Signed)
Plan of care has bee reviewed.  Problem: Cardiac: post op day 5th,  Goal: Will achieve and/or maintain hemodynamic stability Outcome: Progressing: sinus rhythm on monitor, BP remains stable, no distress.   Problem: Respiratory: Goal: Respiratory status will improve Outcome: Progressing: SPO2 97% on room air, ausculted breath sound clear, no wheezing, effectively coughing and working on incentive spirometer, no respiratory distress.   Problem: Urinary Elimination: Goal: Ability to achieve and maintain adequate renal perfusion and functioning will improve Outcome: Progressing: adequate urine out put after Lasix given BID. Pt prefers to use Purewick at night.    Problem: Pain Managment: Goal: General experience of comfort will improve Outcome: Progressing: Pt stated she has no pain, scale 0/10. No pain med requested.   Problem: Skin Integrity: sternal wound, right leg EVH and both groin puncture site. Goal: Risk for impaired skin integrity will decrease and no infection Outcome: Progressing: skin and surgical wounds are dry, clean and intact, no drainage.  Problem: Activity: limited post op. Goal: Risk for activity intolerance will decrease Outcome: Progressing: ambulated in room and able to tolerate very well with standby assisted.   Eusebio Me PCCN-CMC

## 2018-10-11 NOTE — Progress Notes (Signed)
Patient ID: Molly Chandler, female   DOB: 08/11/1935, 84 y.o.   MRN: [id number] EVENING ROUNDS NOTE :     [address]       [place] [address]             [phone]                 6 Days Post-Op Procedure(s) (LRB): CORONARY ARTERY BYPASS GRAFTING (CABG)x 2,using left internal mammary artery and right leg greater saphenous vein harvested endoscopically (N/A)  Total Length of Stay:  LOS: 6 days  BP 124/67   Pulse 74   Temp 97.9 F (36.6 C) (Oral)   Resp (!) 21   Ht 4' 11.5" (1.511 m)   Wt 80.7 kg   SpO2 98%   BMI 35.33 kg/m   .Intake/Output      02/02 0701 - 02/03 0700 02/03 0701 - 02/04 0700   P.O. 480    I.V. (mL/kg)     IV Piggyback  131.7   Total Intake(mL/kg) 480 (5.9) 131.7 (1.6)   Urine (mL/kg/hr) 1000 (0.5) 200 (0.2)   Total Output 1000 200   Net -520 -68.3          . sodium chloride    . lactated ringers 10 mL/hr at 10/08/18 1842  . lactated ringers 10 mL/hr at 10/08/18 1743  . potassium chloride Stopped (10/08/18 2232)  . sodium chloride 500 mL (10/11/18 1401)     Lab Results  Component Value Date   WBC 6.2 10/11/2018   HGB 11.7 (L) 10/11/2018   HCT 34.9 (L) 10/11/2018   PLT 175 10/11/2018   GLUCOSE 93 10/10/2018   CHOL 178 10/05/2018   TRIG 20 10/05/2018   HDL 47 10/05/2018   LDLDIRECT 156.4 05/30/2010   LDLCALC 127 (H) 10/05/2018   ALT 18 10/05/2018   AST 58 (H) 10/05/2018   NA 140 10/10/2018   K 4.4 10/10/2018   CL 105 10/10/2018   CREATININE 0.70 10/10/2018   BUN 14 10/10/2018   CO2 25 10/10/2018   TSH 2.746 10/05/2018   INR 1.86 10/05/2018   HGBA1C 5.3 10/05/2018   Drop in bp after removal of pacing wires, echo done not tamponade Some lower abdominal complaint, not swelling or tenderness in abdomen on exam   Grace Isaac MD  [place] [phone] Office [phone] 10/11/2018 6:59 PM

## 2018-10-11 NOTE — Progress Notes (Signed)
CRITICAL VALUE ALERT  Critical Value:  Troponin 1.07  Date & Time Notied:  10/11/2018  At 1700  Provider Notified: MD Servando Snare during evening rounds  Orders Received/Actions taken: no new orders.

## 2018-10-11 NOTE — Progress Notes (Signed)
OT Cancellation Note  Patient Details Name: Aubrianne Molyneux MRN: [id number] DOB: March 25, 1935   Cancelled Treatment:    Reason Eval/Treat Not Completed: Medical issues which prohibited therapy(Pt hypotensive, transferring to ICU. Will follow.)  Malka So 10/11/2018, 2:49 PM  Nestor Lewandowsky, OTR/L Acute Rehabilitation Services Pager: [phone] Office: [phone]

## 2018-10-11 NOTE — Care Management Note (Signed)
Case Management Note Marvetta Gibbons RN, BSN Transitions of Care Unit 4E- RN Case Manager [phone]  Patient Details  Name: Molly Chandler MRN: [id number] Date of Birth: December 26, 1934  Subjective/Objective:  Pt admitted with STEMI, s/p CABGx2  on  10/05/18                Action/Plan: PTA pt lived at home, anticipate return home, orders placed for HHRN/PT and DME RW.  Prior to CM seeing pt for transition of care needs today, pt had a change in status stating she didn't feel well- developing nausea, weakness and dizziness, with drop in BP. Pt to tx back to ICU for closer monitoring. CM will f/u with pt for [facility] choice and transition of care needs when more stable and prior to transition home.   Expected Discharge Date:                  Expected Discharge Plan:  [place]  In-House Referral:  NA  Discharge planning Services  CM Consult  Post Acute Care Choice:  Durable Medical Equipment, Home Health Choice offered to:  Patient  DME Arranged:  Walker rolling DME Agency:  [place] Arranged:  RN, PT [facility] Agency:     Status of Service:  In process, will continue to follow  If discussed at Long Length of Stay Meetings, dates discussed:    Discharge Disposition: home/home health   Additional Comments:  Dawayne Patricia, RN 10/11/2018, 2:31 PM

## 2018-10-11 NOTE — Progress Notes (Addendum)
      [address]       [place], [address]             [phone]      6 Days Post-Op Procedure(s) (LRB): CORONARY ARTERY BYPASS GRAFTING (CABG)x 2,using left internal mammary artery and right leg greater saphenous vein harvested endoscopically (N/A)   Subjective:  No new complaints.  She isn't sleeping all that well.  Wants to go home.  Objective: Vital signs in last 24 hours: Temp:  [97.9 F (36.6 C)-98.7 F (37.1 C)] 98 F (36.7 C) (02/03 0014) Pulse Rate:  [38-93] 38 (02/03 0506) Cardiac Rhythm: Normal sinus rhythm (02/03 0700) Resp:  [0-25] 17 (02/03 0534) BP: (101-132)/(58-78) 124/64 (02/03 0506) SpO2:  [97 %-100 %] 98 % (02/03 0506) Weight:  [80.7 kg] 80.7 kg (02/03 0534)  Intake/Output from previous day: 02/02 0701 - 02/03 0700 In: 480 [P.O.:480] Out: 1000 [Urine:1000]  General appearance: alert, cooperative and no distress Heart: regular rate and rhythm Lungs: clear to auscultation bilaterally Abdomen: soft, non-tender; bowel sounds normal; no masses,  no organomegaly Extremities: edema trace-1+ Wound: clean and dry, ecchymosis of RLE  Lab Results: Recent Labs    10/09/18 0527 10/10/18 0723  WBC 5.1 5.0  HGB 9.4* 11.2*  HCT 29.9* 34.1*  PLT 88* 125*   BMET:  Recent Labs    10/09/18 0527 10/10/18 0723  NA 140 140  K 4.4 4.4  CL 105 105  CO2 27 25  GLUCOSE 100* 93  BUN 16 14  CREATININE 0.62 0.70  CALCIUM 8.3* 8.8*    PT/INR: No results for input(s): LABPROT, INR in the last 72 hours. ABG    Component Value Date/Time   PHART 7.347 (L) 10/06/2018 1209   HCO3 22.5 10/06/2018 1209   TCO2 24 10/06/2018 1209   ACIDBASEDEF 3.0 (H) 10/06/2018 1209   O2SAT 81.1 10/07/2018 0525   CBG (last 3)  No results for input(s): GLUCAP in the last 72 hours.  Assessment/Plan: S/P Procedure(s) (LRB): CORONARY ARTERY BYPASS GRAFTING (CABG)x 2,using left internal mammary artery and right leg greater saphenous vein harvested  endoscopically (N/A)  1. CV- NSR, BP is okay, required Neo for a period of time post operatively, will hold off on starting ACE for now, continue Lopressor 2. Pulm- no acute issues, off oxygen, continue IS 3. Renal- creatinine WNL, weight remains elevated, + edema on exam, will convert to oral Lasix 40 mg daily 4. Expected post operative blood loss anemia, mild 5. Dispo- patient stable, maintaining NSR, will hold off on ACE for now due to prolonged requirement of Neo post operatively, can start as an outpatient if her BP allows, will d/c EPW, continue diuretics, if remains stable, likely d/c in AM   LOS: 6 days    Ellwood Handler 10/11/2018   Chart reviewed, patient examined, agree with above. She is progressing well. Continues to diurese and wt is decreasing. Her leg edema is much better but wt is still probably 10 lbs over preop. Will continue oral lasix for a while longer.  Plan home tomorrow if no changes.

## 2018-10-12 LAB — BASIC METABOLIC PANEL
Anion gap: 12 (ref 5–15)
BUN: 17 mg/dL (ref 8–23)
CO2: 23 mmol/L (ref 22–32)
Calcium: 8.4 mg/dL — ABNORMAL LOW (ref 8.9–10.3)
Chloride: 103 mmol/L (ref 98–111)
Creatinine, Ser: 0.78 mg/dL (ref 0.44–1.00)
GFR calc Af Amer: >60 mL/min (ref >60)
GFR calc non Af Amer: >60 mL/min (ref >60)
Glucose, Bld: 83 mg/dL (ref 70–99)
Potassium: 4 mmol/L (ref 3.5–5.1)
Sodium: 138 mmol/L (ref 135–145)

## 2018-10-12 LAB — CBC
HCT: 32 % — ABNORMAL LOW (ref 36.0–46.0)
Hemoglobin: 10.5 g/dL — ABNORMAL LOW (ref 12.0–15.0)
MCH: 30.4 pg (ref 26.0–34.0)
MCHC: 32.8 g/dL (ref 30.0–36.0)
MCV: 92.8 fL (ref 80.0–100.0)
Platelets: 184 10*3/uL (ref 150–400)
RBC: 3.45 MIL/uL — ABNORMAL LOW (ref 3.87–5.11)
RDW: 13.2 % (ref 11.5–15.5)
WBC: 6.7 10*3/uL (ref 4.0–10.5)
nRBC: 0 % (ref 0.0–0.2)

## 2018-10-12 LAB — TROPONIN I: Troponin I: 0.71 ng/mL (ref <0.03)

## 2018-10-12 NOTE — Progress Notes (Signed)
Physical Therapy Treatment Patient Details Name: Molly Chandler MRN: [id number] DOB: 03/03/1935 Today's Date: 10/12/2018    History of Present Illness Pt is a 84 y.o. F with significant PMH of rectal CA and osteoporosis s/p coronary artery bypass grafting x 2    PT Comments    Patient seen for activity progression. Tolerated in hall ambulation without device today. Doing well with mobility progression. VSS on room air throughout. Current POC remains appropriate.   Follow Up Recommendations  Home health PT;Supervision/Assistance - 24 hour     Equipment Recommendations  None recommended by PT    Recommendations for Other Services OT consult     Precautions / Restrictions Precautions Precautions: Sternal;Fall Precaution Booklet Issued: Yes (comment) Precaution Comments: verbally reviewed and provided written handout with pt and pt daughter Restrictions Weight Bearing Restrictions: Yes(sternal precautions) Other Position/Activity Restrictions: sternal precautions    Mobility  Bed Mobility Overal bed mobility: Needs Assistance Bed Mobility: Sit to Supine       Sit to supine: Min assist   General bed mobility comments: In recliner upon arrival, min assist to elevate LEs back to bed at end of session  Transfers Overall transfer level: Needs assistance Equipment used: None Transfers: Sit to/from Stand Sit to Stand: Min guard         General transfer comment: min guard for safety  Ambulation/Gait Ambulation/Gait assistance: Min guard Gait Distance (Feet): 400 Feet Assistive device: None Gait Pattern/deviations: Step-through pattern;Drifts right/left;Decreased stride length;Trunk flexed Gait velocity: decr Gait velocity interpretation: <1.8 ft/sec, indicate of risk for recurrent falls General Gait Details: steady with ambuilation, 2 standing rest breaks, VSS on room air, no device   Stairs             Wheelchair Mobility    Modified Rankin (Stroke  Patients Only)       Balance Overall balance assessment: Needs assistance Sitting-balance support: Feet supported Sitting balance-Leahy Scale: Good     Standing balance support: Bilateral upper extremity supported Standing balance-Leahy Scale: Fair                              Cognition Arousal/Alertness: Awake/alert Behavior During Therapy: WFL for tasks assessed/performed Overall Cognitive Status: Within Functional Limits for tasks assessed                                        Exercises      General Comments        Pertinent Vitals/Pain Faces Pain Scale: Hurts a little bit Pain Location: sternal Pain Descriptors / Indicators: Operative site guarding    Home Living                      Prior Function            PT Goals (current goals can now be found in the care plan section) Acute Rehab PT Goals Patient Stated Goal: "go home." PT Goal Formulation: With patient Time For Goal Achievement: 10/23/18 Potential to Achieve Goals: Good Progress towards PT goals: Progressing toward goals    Frequency    Min 3X/week      PT Plan Current plan remains appropriate    Co-evaluation              AM-PAC PT "6 Clicks" Mobility   Outcome Measure  Help needed turning  from your back to your side while in a flat bed without using bedrails?: None Help needed moving from lying on your back to sitting on the side of a flat bed without using bedrails?: A Lot Help needed moving to and from a bed to a chair (including a wheelchair)?: A Little Help needed standing up from a chair using your arms (e.g., wheelchair or bedside chair)?: A Lot Help needed to walk in hospital room?: None Help needed climbing 3-5 steps with a railing? : A Lot 6 Click Score: 17    End of Session Equipment Utilized During Treatment: Oxygen Activity Tolerance: Patient tolerated treatment well Patient left: in bed;with call bell/phone within reach;with  family/visitor present Nurse Communication: Mobility status PT Visit Diagnosis: Unsteadiness on feet (R26.81);Difficulty in walking, not elsewhere classified (R26.2)     Time: 2536-6440 PT Time Calculation (min) (ACUTE ONLY): 18 min  Charges:  $Gait Training: 8-22 mins                     Charlotte Crumb, PT DPT  Board Certified Neurologic Specialist Acute Rehabilitation Services Pager [phone] Office [phone]    Fabio Asa 10/12/2018, 4:34 PM

## 2018-10-12 NOTE — Progress Notes (Signed)
Patient was transferred to room 4E08 via wheelchair. Pt is alert and oriented, on room air and ICU monitor, vital signs stable, no complaints of pain. Accepting nurse and nursing assistant was at the bedside to greet and settle the patient.

## 2018-10-12 NOTE — Progress Notes (Signed)
Patient transferred to [place] room 08, [place] verified x2, skin assessment RN double verification, VSS, will continue to monitor.

## 2018-10-12 NOTE — Progress Notes (Signed)
Telephone SBAR report given to the nurse assigned room [phone]

## 2018-10-12 NOTE — Progress Notes (Addendum)
[facility] DAILY ICU PROGRESS NOTE                   [address]            [place] [address]          [phone]   7 Days Post-Op Procedure(s) (LRB): CORONARY ARTERY BYPASS GRAFTING (CABG)x 2,using left internal mammary artery and right leg greater saphenous vein harvested endoscopically (N/A)  Total Length of Stay:  LOS: 7 days   Subjective:  Patient still feels week.  She has some nausea and mild abdominal pain.  She states her abdominal pain improved some after having a BM this morning, which was diarrhea.  Objective: Vital signs in last 24 hours: Temp:  [97.9 F (36.6 C)-98.4 F (36.9 C)] 98.4 F (36.9 C) (02/04 0801) Pulse Rate:  [63-85] 77 (02/04 0800) Cardiac Rhythm: Normal sinus rhythm (02/04 0330) Resp:  [12-30] 18 (02/04 0800) BP: (83-130)/(43-97) 108/55 (02/04 0800) SpO2:  [91 %-98 %] 96 % (02/04 0800) Weight:  [79.8 kg] 79.8 kg (02/04 0500)  Filed Weights   10/10/18 0600 10/11/18 0534 10/12/18 0500  Weight: 82.9 kg 80.7 kg 79.8 kg    Weight change: -0.9 kg   Intake/Output from previous day: 02/03 0701 - 02/04 0700 In: 738.3 [P.O.:240; IV Piggyback:498.3] Out: 1050 [Urine:1050]  Current Meds: Scheduled Meds: . aspirin EC  81 mg Oral Daily  . atorvastatin  80 mg Oral q1800  . carvedilol  3.125 mg Oral BID WC  . clopidogrel  75 mg Oral Daily  . enoxaparin (LOVENOX) injection  30 mg Subcutaneous Q24H  . latanoprost  1 drop Both Eyes QHS  . pantoprazole  40 mg Oral Daily  . sodium chloride flush  3 mL Intravenous Q12H   Continuous Infusions: . sodium chloride    . lactated ringers 10 mL/hr at 10/08/18 1842  . lactated ringers 10 mL/hr at 10/08/18 1743  . potassium chloride Stopped (10/08/18 2232)   PRN Meds:.levalbuterol, ondansetron (ZOFRAN) IV, oxyCODONE, potassium chloride, sodium chloride flush, traMADol  General appearance: alert, cooperative and no distress Heart: regular rate and rhythm Lungs: clear to auscultation  bilaterally Abdomen: soft, non-tender; bowel sounds normal; no masses,  no organomegaly Extremities: edema trace Wound: clean and dry  Lab Results: CBC: Recent Labs    10/11/18 1452 10/12/18 0324  WBC 6.2 6.7  HGB 11.7* 10.5*  HCT 34.9* 32.0*  PLT 175 184   BMET:  Recent Labs    10/10/18 0723 10/12/18 0324  NA 140 138  K 4.4 4.0  CL 105 103  CO2 25 23  GLUCOSE 93 83  BUN 14 17  CREATININE 0.70 0.78  CALCIUM 8.8* 8.4*    CMET: Lab Results  Component Value Date   WBC 6.7 10/12/2018   HGB 10.5 (L) 10/12/2018   HCT 32.0 (L) 10/12/2018   PLT 184 10/12/2018   GLUCOSE 83 10/12/2018   CHOL 178 10/05/2018   TRIG 20 10/05/2018   HDL 47 10/05/2018   LDLDIRECT 156.4 05/30/2010   LDLCALC 127 (H) 10/05/2018   ALT 18 10/05/2018   AST 58 (H) 10/05/2018   NA 138 10/12/2018   K 4.0 10/12/2018   CL 103 10/12/2018   CREATININE 0.78 10/12/2018   BUN 17 10/12/2018   CO2 23 10/12/2018   TSH 2.746 10/05/2018   INR 1.86 10/05/2018   HGBA1C 5.3 10/05/2018      PT/INR: No results for input(s): LABPROT, INR in the last 72 hours.  Radiology: No results found.   Assessment/Plan: S/P Procedure(s) (LRB): CORONARY ARTERY BYPASS GRAFTING (CABG)x 2,using left internal mammary artery and right leg greater saphenous vein harvested endoscopically (N/A)  1. CV- NSR, BP is labile running 90-100s- Amiodarone stopped due to Nausea 2. Pulm- no acute issues, continue IS 3. Renal- creatinine WNL, weight is stable, Lasix discontinued, as patient likely over diuresed yesterday, and BP is labile 4. GI- nausea, no vomiting- stop all stool softners, continue zofran prn 5. Dispo- patient continues to feel week, with nausea, low grade abdominal pain, BP remains labile no diuretics, will continue observation, current care     Ellwood Handler 10/12/2018 8:26 AM    Chart reviewed, patient examined, agree with above. She feels better this afternoon. Had BM this am and lower abdominal pain is gone so  I suspect that was gas. She has ambulated. BP still borderline in the 90's so will hold off on beta blocker and no further diuresis. She has no peripheral edema. Troponin continues to decrease from preop level of 14. May take a couple weeks postop to normalize. Plan to transfer to 4E in am if no changes.

## 2018-10-13 NOTE — Progress Notes (Signed)
8 Days Post-Op Procedure(s) (LRB): CORONARY ARTERY BYPASS GRAFTING (CABG)x 2,using left internal mammary artery and right leg greater saphenous vein harvested endoscopically (N/A) Subjective: No complaints. Did not sleep well because she was transferred to 4E late last night. Ate breakfast this am. Ambulating. Objective: Vital signs in last 24 hours: Temp:  [98.2 F (36.8 C)-99 F (37.2 C)] 98.2 F (36.8 C) (02/05 0813) Pulse Rate:  [76-93] 93 (02/05 0813) Cardiac Rhythm: Normal sinus rhythm (02/05 0700) Resp:  [14-24] 22 (02/05 0813) BP: (90-131)/(47-86) 106/56 (02/05 0813) SpO2:  [94 %-98 %] 97 % (02/05 0813) Weight:  [78.2 kg-78.9 kg] 78.2 kg (02/05 0500)  Hemodynamic parameters for last 24 hours:    Intake/Output from previous day: 02/04 0701 - 02/05 0700 In: 640 [P.O.:640] Out: 2700 [Urine:2700] Intake/Output this shift: No intake/output data recorded.  General appearance: alert and cooperative Heart: regular rate and rhythm, S1, S2 normal, no murmur, click, rub or gallop Lungs: clear to auscultation bilaterally Abdomen: soft, non-tender; bowel sounds normal; no masses,  no organomegaly Extremities: extremities normal, atraumatic, no cyanosis or edema Wound: incisions ok  Lab Results: Recent Labs    10/11/18 1452 10/12/18 0324  WBC 6.2 6.7  HGB 11.7* 10.5*  HCT 34.9* 32.0*  PLT 175 184   BMET:  Recent Labs    10/12/18 0324  NA 138  K 4.0  CL 103  CO2 23  GLUCOSE 83  BUN 17  CREATININE 0.78  CALCIUM 8.4*    PT/INR: No results for input(s): LABPROT, INR in the last 72 hours. ABG    Component Value Date/Time   PHART 7.347 (L) 10/06/2018 1209   HCO3 22.5 10/06/2018 1209   TCO2 24 10/06/2018 1209   ACIDBASEDEF 3.0 (H) 10/06/2018 1209   O2SAT 81.1 10/07/2018 0525   CBG (last 3)  Recent Labs    10/11/18 1355  GLUCAP 105*    Assessment/Plan: S/P Procedure(s) (LRB): CORONARY ARTERY BYPASS GRAFTING (CABG)x 2,using left internal mammary artery  and right leg greater saphenous vein harvested endoscopically (N/A)  POD 8 She has been hemodynamically stable in sinus rhythm and feels well now. I suspect her episode of hypotension and feeling poorly was probably related to over-diuresis with orthostasis and side effects of amio, beta blocker. Will leave off these for now. Continue ambulation and IS today. Plan home tomorrow if no changes.   LOS: 8 days    Gaye Pollack 10/13/2018

## 2018-10-13 NOTE — Progress Notes (Signed)
CARDIAC REHAB PHASE I   Pt just returned to bed for nap. Pt states she and daughter have walked twice today with plans for another later today. Pt demonstrates 1000 on IS. Encouraged continued use. Pt for poss d/c tom, will review ed at that time.   [id number] Rufina Falco, RN BSN 10/13/2018 1:35 PM

## 2018-10-13 NOTE — Care Management Note (Signed)
Case Management Note Molly Gibbons RN, BSN Transitions of Care Unit 4E- RN Case Manager [phone]  Patient Details  Name: Molly Chandler MRN: [id number] Date of Birth: 05/10/1935  Subjective/Objective:  Pt admitted with STEMI, s/p CABGx2  on  10/05/18                Action/Plan: PTA pt lived at home, anticipate return home, orders placed for HHRN/PT and DME RW.  Prior to CM seeing pt for transition of care needs today, pt had a change in status stating she didn't feel well- developing nausea, weakness and dizziness, with drop in BP. Pt to tx back to ICU for closer monitoring. CM will f/u with pt for [facility] choice and transition of care needs when more stable and prior to transition home.   Expected Discharge Date:                  Expected Discharge Plan:  [place]  In-House Referral:  NA  Discharge planning Services  CM Consult  Post Acute Care Choice:  Durable Medical Equipment, Home Health Choice offered to:  Patient, Adult Children  DME Arranged:  [place] rolling DME Agency:     HH Arranged:  RN, PT [place] Agency:  [place]  Status of Service:  Completed, signed off  If discussed at [place] of Stay Meetings, dates discussed:    Discharge Disposition: home/home health   Additional Comments:  10/13/18- 1200- Molly Gibbons RN, CM- f/u done with pt and daughter at bedside for [facility] choice- list provided Per CMS guidelines from medicare.gov website with star ratings (copy placed in shadow chart) per daughter they have used [facility] in past and would like to use them again for [facility] needs- pt states she has all needed DME at home and does not need RW. - Pt's daughter [name] request to be called by [facility] prior to [facility] visits due to dogs in the home- so that she may put the dogs safely away - her contact # is [phone]- Call made to Butch Penny with [facility] for [facility] referral- RN/PT- info shared about dogs and daughter's contact info- referral accepted.   Dawayne Patricia, RN 10/13/2018, 12:31 PM

## 2018-10-13 NOTE — Progress Notes (Signed)
Physical Therapy Treatment Patient Details Name: Molly Chandler MRN: [id number] DOB: Nov 14, 1934 Today's Date: 10/13/2018    History of Present Illness  84 y.o. F with significant PMH of rectal CA and osteoporosis s/p coronary artery bypass grafting x 2    PT Comments    Pt was seen for gait and transfers, then reviewed there ex to increase comfort and mobility of LE's.  Pt was able to walk with one short rest due to O2 sat being 88% then remained at 95% or higher for the rest of the walk.  Her plan is to progress as tolerated toward home, and mainly may need some guidance for limiting her efforts to push herself a bit more than is required.  Follow acutely for teaching and careful management of activities and monitoring of O2 sats and pulses with mobility.    Follow Up Recommendations  Home health PT;Supervision/Assistance - 24 hour     Equipment Recommendations  None recommended by PT    Recommendations for Other Services OT consult     Precautions / Restrictions Precautions Precautions: Sternal;Fall Precaution Booklet Issued: Yes (comment) Restrictions Weight Bearing Restrictions: Yes Other Position/Activity Restrictions: sternal precautions    Mobility  Bed Mobility Overal bed mobility: Needs Assistance Bed Mobility: Supine to Sit;Sit to Supine     Supine to sit: Min assist Sit to supine: Min assist      Transfers Overall transfer level: Needs assistance Equipment used: (pillow) Transfers: Sit to/from Stand Sit to Stand: Min guard            Ambulation/Gait Ambulation/Gait assistance: Min guard Gait Distance (Feet): 350 Feet Assistive device: None Gait Pattern/deviations: Step-through pattern;Wide base of support;Decreased stride length(L foot looks weak) Gait velocity: decreased Gait velocity interpretation: <1.31 ft/sec, indicative of household ambulator General Gait Details: drifting on the hall, verbal cues to avoid obstacles, noted L foot  drop   Stairs             Wheelchair Mobility    Modified Rankin (Stroke Patients Only)       Balance Overall balance assessment: Needs assistance Sitting-balance support: Feet supported Sitting balance-Leahy Scale: Good     Standing balance support: No upper extremity supported Standing balance-Leahy Scale: Fair                              Cognition Arousal/Alertness: Awake/alert Behavior During Therapy: WFL for tasks assessed/performed Overall Cognitive Status: Within Functional Limits for tasks assessed                                        Exercises General Exercises - Lower Extremity Ankle Circles/Pumps: AROM;Both;5 reps Quad Sets: AROM;Both;10 reps Hip ABduction/ADduction: AROM;Both;10 reps    General Comments        Pertinent Vitals/Pain Pain Assessment: 0-10 Pain Score: 2  Pain Location: sternal Pain Descriptors / Indicators: Operative site guarding    Home Living                      Prior Function            PT Goals (current goals can now be found in the care plan section) Acute Rehab PT Goals Patient Stated Goal: get home and feel better Progress towards PT goals: Progressing toward goals    Frequency    Min 3X/week  PT Plan Current plan remains appropriate    Co-evaluation              AM-PAC PT "6 Clicks" Mobility   Outcome Measure  Help needed turning from your back to your side while in a flat bed without using bedrails?: None Help needed moving from lying on your back to sitting on the side of a flat bed without using bedrails?: A Little Help needed moving to and from a bed to a chair (including a wheelchair)?: A Little Help needed standing up from a chair using your arms (e.g., wheelchair or bedside chair)?: A Little Help needed to walk in hospital room?: A Little Help needed climbing 3-5 steps with a railing? : Total 6 Click Score: 17    End of Session Equipment  Utilized During Treatment: Gait belt Activity Tolerance: Patient tolerated treatment well;Patient limited by fatigue Patient left: in bed;with call bell/phone within reach;with family/visitor present;with bed alarm set Nurse Communication: Mobility status PT Visit Diagnosis: Unsteadiness on feet (R26.81);Difficulty in walking, not elsewhere classified (R26.2)     Time: 1610-9604 PT Time Calculation (min) (ACUTE ONLY): 28 min  Charges:  $Gait Training: 8-22 mins $Therapeutic Exercise: 8-22 mins                  Ivar Drape 10/13/2018, 5:16 PM  Samul Dada, PT MS Acute Rehab Dept. Number: [facility] [phone] and [facility] [phone]

## 2018-10-13 NOTE — Care Management Important Message (Signed)
Important Message  Patient Details  Name: Molly Chandler MRN: [id number] Date of Birth: 20-Jun-1935   Medicare Important Message Given:  Yes    Jasher Barkan P [place] 10/13/2018, 2:36 PM

## 2018-10-14 MED ORDER — ONDANSETRON HCL 4 MG/2ML IJ SOLN: 4.0000 mg | Freq: Four times a day (QID) | INTRAMUSCULAR | 0 refills | Status: DC | PRN

## 2018-10-14 MED ORDER — CLOPIDOGREL BISULFATE 75 MG PO TABS: 75.0000 mg | ORAL_TABLET | Freq: Every day | ORAL | 3 refills | Status: DC

## 2018-10-14 MED ORDER — ASPIRIN 81 MG PO TBEC: 81.0000 mg | DELAYED_RELEASE_TABLET | Freq: Every day | ORAL | Status: DC

## 2018-10-14 MED ORDER — ATORVASTATIN CALCIUM 80 MG PO TABS: 80.0000 mg | ORAL_TABLET | Freq: Every day | ORAL | 3 refills | Status: DC

## 2018-10-14 MED ORDER — TRAMADOL HCL 50 MG PO TABS: 50.0000 mg | ORAL_TABLET | ORAL | 0 refills | Status: DC | PRN

## 2018-10-14 MED FILL — CLOPIDOGREL 75 MG TABLET: 75 | 30 days supply | Qty: 30 | Fill #0 | Status: TO

## 2018-10-14 MED FILL — ATORVASTATIN CALCIUM 80 MG: 80 | 30 days supply | Qty: 30 | Fill #0 | Status: TO

## 2018-10-14 NOTE — Progress Notes (Signed)
CARDIAC REHAB PHASE I   Pt seen ambulating in hallway independently with daughter. Peak HR noted to be 111. Pt with slight SOB at end of walk. Pt and daughter instructed on importance of showering and monitoring incisions dialy. Encouraged continued use of IS, walks, and sternal precautions. Pt given in-the-tube sheet, and heart healthy diet. Reviewed restrictions and exercise guidelines. Will refer to CRP II [place].  [phone] Rufina Falco, RN BSN 10/14/2018 10:04 AM

## 2018-10-14 NOTE — Progress Notes (Signed)
CARDIOLOGY RECOMMENDATIONS:  Discharge is anticipated in the next 48 hours. Recommendations for medications and follow up:  Discharge Medications: ASA, statin, plavix. BB held at discharge, consider adding as outpatient if possible.  Continue medications as they are currently listed in the [facility]. Exceptions to the above:  None  Follow Up: The patient's Primary Cardiologist is Glenetta Hew, MD   Follow up in the office in 2 week(s).  Signed,  Reino Bellis, NP  10:04 AM 10/14/2018  [facility]

## 2018-10-14 NOTE — Progress Notes (Signed)
9 Days Post-Op Procedure(s) (LRB): CORONARY ARTERY BYPASS GRAFTING (CABG)x 2,using left internal mammary artery and right leg greater saphenous vein harvested endoscopically (N/A) Subjective: No complaints. She ambulated at least 3 times yesterday without problems and feels well. No nausea and eating well since off amiodarone.  Objective: Vital signs in last 24 hours: Temp:  [98.2 F (36.8 C)-99.4 F (37.4 C)] 98.4 F (36.9 C) (02/06 0410) Pulse Rate:  [89-98] 94 (02/06 0410) Cardiac Rhythm: Normal sinus rhythm (02/06 0700) Resp:  [20-28] 23 (02/06 0431) BP: (106-133)/(52-73) 133/62 (02/06 0410) SpO2:  [96 %-100 %] 98 % (02/06 0410) Weight:  [77.6 kg] 77.6 kg (02/06 0431)  Hemodynamic parameters for last 24 hours:    Intake/Output from previous day: 02/05 0701 - 02/06 0700 In: 560 [P.O.:560] Out: -  Intake/Output this shift: No intake/output data recorded.  General appearance: alert and cooperative Neurologic: intact Heart: regular rate and rhythm, S1, S2 normal, no murmur, click, rub or gallop Lungs: clear to auscultation bilaterally Extremities: extremities normal, atraumatic, no cyanosis or edema Wound: incisions ok  Lab Results: Recent Labs    10/11/18 1452 10/12/18 0324  WBC 6.2 6.7  HGB 11.7* 10.5*  HCT 34.9* 32.0*  PLT 175 184   BMET:  Recent Labs    10/12/18 0324  NA 138  K 4.0  CL 103  CO2 23  GLUCOSE 83  BUN 17  CREATININE 0.78  CALCIUM 8.4*    PT/INR: No results for input(s): LABPROT, INR in the last 72 hours. ABG    Component Value Date/Time   PHART 7.347 (L) 10/06/2018 1209   HCO3 22.5 10/06/2018 1209   TCO2 24 10/06/2018 1209   ACIDBASEDEF 3.0 (H) 10/06/2018 1209   O2SAT 81.1 10/07/2018 0525   CBG (last 3)  Recent Labs    10/11/18 1355  GLUCAP 105*    Assessment/Plan: S/P Procedure(s) (LRB): CORONARY ARTERY BYPASS GRAFTING (CABG)x 2,using left internal mammary artery and right leg greater saphenous vein harvested  endoscopically (N/A)  She is doing well and BP stable off beta blocker. Will plan to send her home without beta blocker and see how she looks in a few weeks before considering restarting it. Her wt is about at baseline and no edema so I don't she needs any diuretic. Plan home today with daughter.     LOS: 9 days    Gaye Pollack 10/14/2018

## 2018-10-14 NOTE — Progress Notes (Signed)
OT Cancellation Note  Patient Details Name: Molly Chandler MRN: [id number] DOB: September 03, 1935   Cancelled Treatment:    Reason Eval/Treat Not Completed: Patient declined, no reason specified(Pt planning d/c home soon and will walk with daughter.)   Ebony Hail Harold Hedge) Marsa Aris OTR/L Acute Rehabilitation Services Pager: [phone] Office: [phone]   Fredda Hammed 10/14/2018, 10:06 AM

## 2018-10-15 ENCOUNTER — Telehealth: Payer: Self-pay

## 2018-10-15 DIAGNOSIS — Z951 Presence of aortocoronary bypass graft: Secondary | ICD-10-CM | POA: Diagnosis not present

## 2018-10-15 DIAGNOSIS — Z48812 Encounter for surgical aftercare following surgery on the circulatory system: Secondary | ICD-10-CM | POA: Diagnosis not present

## 2018-10-15 NOTE — Telephone Encounter (Signed)
Ria Comment, nurse with [facility] [phone] contacted the office requesting verbal orders for start of care for Ms. Molly Chandler s/p CABG with Dr. Cyndia Bent 10/05/2018.  Verbal orders given for one time a week for one week, and 2 times a week for one week.  Verbal orders also given for Occupational Therapy.  Will await faxed copy for physician signature.

## 2018-10-15 NOTE — Telephone Encounter (Signed)
Left message to follow up with patient on how she is doing. Per D/C notes patient is to follow up with cardiology and no need for appointment with Webb Silversmith, NP unless patient would like to come in or if she has any concerns.

## 2018-10-18 ENCOUNTER — Other Ambulatory Visit: Payer: Self-pay

## 2018-10-18 ENCOUNTER — Ambulatory Visit (INDEPENDENT_AMBULATORY_CARE_PROVIDER_SITE_OTHER): Payer: Self-pay

## 2018-10-18 DIAGNOSIS — Z4802 Encounter for removal of sutures: Secondary | ICD-10-CM

## 2018-10-18 NOTE — Progress Notes (Signed)
Patient arrived for nurse visit to remove sutures post- procedure CABG 10/05/2018 with Dr. Cyndia Bent.  Sutures removed with no signs/ symptoms of infection noted.  Patient tolerated procedure well.  Patient/ family instructed to keep the incision sites clean and dry.  Patient/ family acknowledged instructions given.  Patient and daughter did express concern about soreness in her left arm since surgery.  Patient states she has to "exercise it out" and started this past Saturday, 10/16/2018.  I advised that it does not seem of any concern at this point, but I would let Dr. Cyndia Bent know.

## 2018-10-20 ENCOUNTER — Encounter: Payer: Self-pay | Admitting: Internal Medicine

## 2018-10-20 ENCOUNTER — Telehealth: Payer: Self-pay

## 2018-10-20 NOTE — Telephone Encounter (Signed)
Molly Chandler, PT with [facility] [phone] contacted the office requesting verbal orders for in home physical therapy s/p CABG x2 with Dr. Cyndia Bent 10/05/2018. Verbal orders given via secure voicemail for two times a week for one week, and one time a week for three weeks.  Will await faxed copy for physician signature.

## 2018-10-21 ENCOUNTER — Telehealth (HOSPITAL_COMMUNITY): Payer: Self-pay

## 2018-10-21 NOTE — Progress Notes (Signed)
Transitions of Care Follow Up Call Note  Molly Chandler is an 84 y.o. female who presented to [facility] on 10/05/2018.  The patient had the following prescriptions filled at [facility] [place] [facility]: atorvastatin, clopidogrel  Patient was called by pharmacist and HIPAA identifiers were verified. The following questions were asked about the prescriptions filled at [facility]:  Has the patient been experiencing any side effects to the medications prescribed? no Understanding of regimen: good Understanding of indications: good Potential of compliance: good  Pharmacist comments: N/A   [x]  Patient's prescriptions filled at the [facility] were transferred to the following pharmacy: [facility]  []  Patient unable to be reached after calling three times and prescriptions filled at the [facility] Pharm[facility] were transferred to preferred pharmacy found within their chart.   [name], PharmD 10/21/2018, 5:59 PM Transitions of Care Pharmacy Hours: Monday - Friday 8:30am to 5:00 PM  Phone - [phone]

## 2018-10-21 NOTE — Telephone Encounter (Signed)
Pt insurance is active and benefits verified through [facility], Co-pay $20.00, DED $0.00/$0.00 met, out of pocket $4,000.00/$167.56 met, co-insurance 0%. No pre-authorization required. Passport, 10/21/2018 @ 4:04PM, REF# [id number]  Will contact patient to see if she is interested in the Cardiac Rehab Program. If interested, patient will need to complete follow up appt. Once completed, patient will be contacted for scheduling upon review by the RN Navigator.

## 2018-10-22 ENCOUNTER — Telehealth: Payer: Self-pay

## 2018-10-22 NOTE — Telephone Encounter (Signed)
Yes transfer is okay with me.

## 2018-10-22 NOTE — Telephone Encounter (Signed)
Hey can you route this to get pt scheduled at [facility]. Thank you.

## 2018-10-22 NOTE — Telephone Encounter (Signed)
[name] pts daughter who is scheduling pts appts request to change PCP back to Dr Deborra Medina at [facility].Please advise.

## 2018-10-22 NOTE — Telephone Encounter (Signed)
TB-Both providers are in agreement for transfer of care to TA/ca you direct this to someone who is aware of the scheduling parameters/thx dmf

## 2018-10-22 NOTE — Telephone Encounter (Signed)
Fine with me

## 2018-10-27 ENCOUNTER — Ambulatory Visit: Payer: Medicare Other | Admitting: Adult Health

## 2018-10-31 NOTE — Progress Notes (Signed)
Cardiology Office Note   Date:  11/01/2018   ID:  Jourdan, Pasternak 03-27-1935, MRN [id number]  PCP:  Lorre Munroe, NP  Cardiologist:  Dr.Harding Chief Complaint  Patient presents with  . Coronary Artery Disease    S/P CABG      History of Present Illness: Molly Chandler is a 84 y.o. female who presents for ongoing assessment and management of CAD, s/p hospitalization on 1/28 /2020 for anterior STEMI undergoing an emergent cardiac cath, and subsequent CABG on 10/05/2018.   Cath revealed severe distal left main to ostial LAD of 95% stenosis, with ostial Cx lesion of 50% with moderate severely reduced systolic dysfunction with EF of 25%-35%. She required IABP due to cardiogenic shock and had acute combined CHF. She was recommended for CABG.   She had a 2 vessel CABG performed on 10/05/2018 with LIMA to LAD and SVG to OM.  She was diuresed and weaned off of pressors. She had post operative hypotension and was check for cardiac tamponade by echo, which was negative. She was started on DAPT, daily lasix, and BB. She was discharged 10/09/2018.   She has been having left sided chest pain over the last 24 hours. This is along the left lateral from her axilla down to just below her last rib. She also has some pain in the left lower flank. She states that it hurts to take a deep breath. She is not coughing or having worsening of the pain with movement. She states she is walking in the house during the day, but she has days when she is really tired.   Her daughter also states that her appetite has not come back, she is eating very little. She denies fever or chills. She has had worsening urinary incontinence.    Past Medical History:  Diagnosis Date  . Cataract   . Lymphoma (HCC)    rectal cancer  . Osteoporosis    osteopenia    Past Surgical History:  Procedure Laterality Date  . CORONARY ARTERY BYPASS GRAFT N/A 10/05/2018   Procedure: CORONARY ARTERY BYPASS GRAFTING (CABG)x  2,using left internal mammary artery and right leg greater saphenous vein harvested endoscopically;  Surgeon: Alleen Borne, MD;  Location: [facility] OR;  Service: Cardiothoracic;  Laterality: N/A;  . CORONARY/GRAFT ACUTE MI REVASCULARIZATION N/A 10/05/2018   Procedure: Coronary/Graft Acute MI Revascularization;  Surgeon: Marykay Lex, MD;  Location: [facility] INVASIVE CV LAB;  Service: Cardiovascular;  Laterality: N/A;  . IABP INSERTION N/A 10/05/2018   Procedure: IABP Insertion;  Surgeon: Marykay Lex, MD;  Location: [facility] INVASIVE CV LAB;  Service: Cardiovascular;  Laterality: N/A;  . LEFT HEART CATH AND CORONARY ANGIOGRAPHY N/A 10/05/2018   Procedure: LEFT HEART CATH AND CORONARY ANGIOGRAPHY;  Surgeon: Marykay Lex, MD;  Location: [facility] INVASIVE CV LAB;  Service: Cardiovascular;  Laterality: N/A;     Current Outpatient Medications  Medication Sig Dispense Refill  . acetaminophen (TYLENOL) 325 MG tablet Take 650 mg by mouth every 6 (six) hours as needed.    . Ascorbic Acid (VITAMIN C PO) Take 1 tablet by mouth daily.     Marland Kitchen aspirin EC 81 MG EC tablet Take 1 tablet (81 mg total) by mouth daily.    Marland Kitchen atorvastatin (LIPITOR) 80 MG tablet Take 1 tablet (80 mg total) by mouth daily at 6 PM. 30 tablet 3  . Cholecalciferol (VITAMIN D PO) Take 1 tablet by mouth daily.     . clopidogrel (PLAVIX) 75 MG  tablet Take 1 tablet (75 mg total) by mouth daily. 30 tablet 3  . Multiple Minerals (CALCIUM-MAGNESIUM-ZINC) TABS Take 1 tablet by mouth at bedtime.    . Multiple Vitamin (MULTIVITAMIN) tablet Take 1 tablet by mouth daily.    Marland Kitchen omeprazole (PRILOSEC) 40 MG capsule Take 40 mg by mouth daily.   12  . Probiotic Product (PROBIOTIC DAILY PO) Take 1 tablet by mouth daily.      No current facility-administered medications for this visit.     Allergies:   Amoxicillin and Metoclopramide hcl    Social History:  The patient  reports that she has quit smoking. She has never used smokeless tobacco. She reports that she  does not drink alcohol.   Family History:  The patient's family history is not on file.    ROS: All other systems are reviewed and negative. Unless otherwise mentioned in H&P    PHYSICAL EXAM: VS:  BP 128/72   Pulse 99   Ht 4' 11.5" (1.511 m)   Wt 165 lb 6.4 oz (75 kg)   SpO2 97%   BMI 32.85 kg/m  , BMI Body mass index is 32.85 kg/m. GEN: Well nourished, well developed, in no acute distress HEENT: normal Neck: no JVD, carotid bruits, or masses Cardiac: RRR; no murmurs, rubs, or gallops,no edema  Respiratory:  Bibasilar crackles, worse on the left. No wheezes.  GI: soft, nontender, nondistended, + BS MS: no deformity or atrophy. Well healed sternotomy incision with no evidence of infection or bleeding. Endoscopic vein harvest site is well healed.  Skin: warm and dry, no rash Neuro:  Strength and sensation are intact Psych: euthymic mood, full affect   EKG:  NSR with rate of 99 bpm, rightward axis.   Recent Labs: 10/05/2018: ALT 18; TSH 2.746 10/06/2018: Magnesium 2.1 10/12/2018: BUN 17; Creatinine, Ser 0.78; Hemoglobin 10.5; Platelets 184; Potassium 4.0; Sodium 138    Lipid Panel    Component Value Date/Time   CHOL 178 10/05/2018 0708   TRIG 20 10/05/2018 0708   HDL 47 10/05/2018 0708   CHOLHDL 3.8 10/05/2018 0708   VLDL 4 10/05/2018 0708   LDLCALC 127 (H) 10/05/2018 0708   LDLDIRECT 156.4 05/30/2010 1501      Wt Readings from Last 3 Encounters:  11/01/18 165 lb 6.4 oz (75 kg)  10/14/18 171 lb 1.6 oz (77.6 kg)  05/31/18 177 lb 8 oz (80.5 kg)      Other studies Reviewed:  CULPRIT LESION: Dist LM to Ost LAD lesion is 95% stenosed. Ost Cx lesion is 50% stenosed.  There is moderate to severe left ventricular systolic dysfunction. The left ventricular ejection fraction is 25-35% by visual estimate -dense anterior wall motion and normality  LV end diastolic pressure is severely elevated.  There is no aortic valve stenosis. There is mild (2+) mitral  regurgitation.  SUMMARY   Severe distal left main-ostial LAD 95% stenosis involving ostium of LCx 50%.  Moderate severely reduced LVEF with diffuse anterior hypokinesis and severely elevated LVEDP  Borderline cardiogenic shock, likely related to nitroglycerin administration, resolved after brief course of IV Levophed.  ACUTE COMBINED SYSTOLIC AND DIASTOLIC HEART FAILURE from acute ischemic cardiomyopathy   Treatments: surgery  1. Median Sternotomy 2. Extracorporeal circulation 3.Emergent coronary artery bypass grafting x2   Left internal mammary graft to the LAD  SVG to OM  4. Endoscopic vein harvest from the rightleg 5. Insertion of left femoral arterial line.  ASSESSMENT AND PLAN:  1. Chest pain: Unilateral on the  left, radiating from her axilla to the left flank area. Some pain is reproducible to palpation, but also when she is taking a deep breath. She was found to have a small pleural effusion on the left post operatively. I will recheck a PA and LAT CXR to evaluate for changes in pleural effusion or new findings.   Also with left flank pain, I will check a UA for evidence of infection.   2. CAD: S/P CABG on 10/05/2018. She is due to have follow up with Dr. Laneta Simmers in March. She is healing well with the exception of #1. She will continue current regimen, remain as active as she can be without causing significant dyspnea or fatigue.       3. Anemia: Will check CBC and BMET for kidney function.   Current medicines are reviewed at length with the patient today.    Labs/ tests ordered today include: UA, CXR, BMET and CBC.   Bettey Mare. Liborio Nixon, ANP, AACC   11/01/2018 3:52 PM    [facility] [address] Office [phone] Fax [phone]

## 2018-11-01 ENCOUNTER — Encounter: Payer: Self-pay | Admitting: Adult Health

## 2018-11-01 ENCOUNTER — Ambulatory Visit (INDEPENDENT_AMBULATORY_CARE_PROVIDER_SITE_OTHER): Payer: Medicare Other | Admitting: Adult Health

## 2018-11-01 ENCOUNTER — Ambulatory Visit
Admission: RE | Admit: 2018-11-01 | Discharge: 2018-11-01 | Disposition: A | Discharge status: Home / Self Care | Payer: Medicare Other | Source: Ambulatory Visit | Attending: Adult Health | Admitting: Adult Health

## 2018-11-01 VITALS — BP 128/72 | HR 99 | Ht 59.5 in | Wt 165.4 lb

## 2018-11-01 DIAGNOSIS — J90 Pleural effusion, not elsewhere classified: Secondary | ICD-10-CM | POA: Diagnosis not present

## 2018-11-01 DIAGNOSIS — I2510 Atherosclerotic heart disease of native coronary artery without angina pectoris: Secondary | ICD-10-CM

## 2018-11-01 DIAGNOSIS — R300 Dysuria: Secondary | ICD-10-CM

## 2018-11-01 DIAGNOSIS — R079 Chest pain, unspecified: Secondary | ICD-10-CM

## 2018-11-01 DIAGNOSIS — Z79899 Other long term (current) drug therapy: Secondary | ICD-10-CM

## 2018-11-01 DIAGNOSIS — Z951 Presence of aortocoronary bypass graft: Secondary | ICD-10-CM

## 2018-11-01 NOTE — Patient Instructions (Signed)
Medication Instructions:  NO CHANGES- Your physician recommends that you continue on your current medications as directed. Please refer to the Current Medication list given to you today. If you need a refill on your cardiac medications before your next appointment, please call your pharmacy.  Labwork: UA, CBC AND BMET TODAY HERE IN OUR OFFICE AT [facility]   Take the provided lab slips with you to the lab for your blood draw.    When you have your labs (blood work) drawn today and your tests are completely normal, you will receive your results only by MyChart Message (if you have MyChart) -OR-  A paper copy in the mail.  If you have any lab test that is abnormal or we need to change your treatment, we will call you to review these results.  Testing/Procedures: A chest x-ray takes a picture of the organs and structures inside the chest, including the heart, lungs, and blood vessels. This test can show several things, including, whether the heart is enlarges; whether fluid is building up in the lungs; and whether pacemaker / defibrillator leads are still in place.  THIS WILL BE AT [place] [address].  Follow-Up: . You will need a follow up appointment in 1 months.  You may see Glenetta Hew, MD , Jory Sims, DNP, AACC  or one of the following Advanced Practice Providers on your designated Care Team:  Rosaria Ferries, PA-C, Jory Sims, DNP, [place], you and your health needs are our priority.  As part of our continuing mission to provide you with exceptional heart care, we have created designated Provider Care Teams.  These Care Teams include your primary Cardiologist (physician) and Advanced Practice Providers (APPs -  Physician Assistants and Nurse Practitioners) who all work together to provide you with the care you need, when you need it.  Thank you for choosing [facility] at [facility]!!

## 2018-11-02 ENCOUNTER — Other Ambulatory Visit: Payer: Self-pay

## 2018-11-02 LAB — CBC
Hematocrit: 37.4 % (ref 34.0–46.6)
Hemoglobin: 12.6 g/dL (ref 11.1–15.9)
MCH: 30.8 pg (ref 26.6–33.0)
MCHC: 33.7 g/dL (ref 31.5–35.7)
MCV: 91 fL (ref 79–97)
Platelets: 211 10*3/uL (ref 150–450)
RBC: 4.09 x10E6/uL (ref 3.77–5.28)
RDW: 13.5 % (ref 11.7–15.4)
WBC: 8.7 10*3/uL (ref 3.4–10.8)

## 2018-11-02 LAB — BASIC METABOLIC PANEL
BUN/Creatinine Ratio: 18 (ref 12–28)
BUN: 12 mg/dL (ref 8–27)
CO2: 23 mmol/L (ref 20–29)
Calcium: 9.4 mg/dL (ref 8.7–10.3)
Chloride: 100 mmol/L (ref 96–106)
Creatinine, Ser: 0.66 mg/dL (ref 0.57–1.00)
GFR calc Af Amer: 96 mL/min/{1.73_m2} (ref >59)
GFR calc non Af Amer: 83 mL/min/{1.73_m2} (ref >59)
Glucose: 97 mg/dL (ref 65–99)
Potassium: 4.7 mmol/L (ref 3.5–5.2)
Sodium: 137 mmol/L (ref 134–144)

## 2018-11-02 LAB — URINALYSIS
Bilirubin, UA: NEGATIVE
Glucose, UA: NEGATIVE
Nitrite, UA: NEGATIVE
Specific Gravity, UA: 1.018 (ref 1.005–1.030)
Urobilinogen, Ur: 0.2 mg/dL (ref 0.2–1.0)
pH, UA: 5.5 (ref 5.0–7.5)

## 2018-11-02 MED ORDER — CIPROFLOXACIN HCL 250 MG PO TABS: 250.0000 mg | ORAL_TABLET | Freq: Two times a day (BID) | ORAL | 0 refills | Status: DC

## 2018-11-02 NOTE — Telephone Encounter (Signed)
Notes recorded by Lendon Colonel, NP on 11/02/2018 at 6:52 AM EST She has evidence of a UTI. Please begin Cipro 250 mg BID for 10 days. NEW RX SENT TO REQUESTED PHARMACY.

## 2018-11-03 ENCOUNTER — Telehealth: Payer: Self-pay

## 2018-11-03 NOTE — Telephone Encounter (Signed)
Molly Chandler, [place] with [facility] [phone] faxed a message to Dr. Cyndia Bent stating that patient has moved her OT evaluation for the second time.  Patient is now getting OT evaluation on 11/01/2018 per Molly Chandler.  Patient's daughter stated patient does need an evaluation so it has been moved to accommodate patient/ family.  Will continue to monitor.

## 2018-11-12 DIAGNOSIS — I2510 Atherosclerotic heart disease of native coronary artery without angina pectoris: Secondary | ICD-10-CM | POA: Insufficient documentation

## 2018-11-12 DIAGNOSIS — I252 Old myocardial infarction: Secondary | ICD-10-CM

## 2018-11-12 NOTE — Progress Notes (Addendum)
Subjective:   Patient ID: Molly Chandler, female    DOB: 1934-12-04, 84 y.o.   MRN: [id number]  Laberta Egeland is a pleasant 84 y.o. year old female who presents to clinic today with Transitions Of Care (Patient is here today to transfer care to Dr. Dayton Martes.)  on 11/15/2018  HPI:  Here with her daughter to re establish care.  I have not seen patient since 04/2017.  Chart reviewed extensively.  Significant changes to her medical history since I last saw her.  Admitted to the hospital 10/05/18- 10/14/18 when she presented with acute onset of epigastric pain, nausea, vomiting and diarrhea the night prior.  That same evening she developed substernal CP- EMS was called and EKG revealed diffuse ST elevation.  She was also hypotensive once EMS arrived.  Code STEMI was called and she was taken directly to cardiac cath lab via EMS.  Cardiac cath showed severe stenosis of distal left main coronary artery.  Intra aortic balloon pump was placed, heparin initialized.    CVTS consulted- due to critical nature of her LAD stenosis, it was felt that CABG was indicated.  She underwent CABG x 2 utilizing LIMA to LAD and SVG to OM.  She also underwent endoscopic harvest of greater saphenous vein from her right leg.  She was not discharged home from hospital on beta blocker due to hypotension.  She was last seen by cardiology, Joni Reining, NP on 11/01/18. Note reviewed.  She is currently on ASA, Plavix, statin.  CXR done at that OV showed decrease in her pericardial effusion.  Also found to have a UTI and given course of cipro.  Has follow up scheduled with CVTS, Dr. Collier Bullock, tomorrow.  BP Readings from Last 3 Encounters:  11/15/18 98/62  11/01/18 128/72  10/14/18 111/65   She is very tired, not eating much since surgery. Has not yet started cardiac rehab but she is walking at home. Denies any blood in her stool or urine.     Wt Readings from Last 3 Encounters:  11/15/18 162 lb (73.5 kg)    11/01/18 165 lb 6.4 oz (75 kg)  10/14/18 171 lb 1.6 oz (77.6 kg)   Lab Results  Component Value Date   WBC 8.7 11/01/2018   HGB 12.6 11/01/2018   HCT 37.4 11/01/2018   MCV 91 11/01/2018   PLT 211 11/01/2018    Current Outpatient Medications on File Prior to Visit  Medication Sig Dispense Refill  . acetaminophen (TYLENOL) 325 MG tablet Take 650 mg by mouth every 6 (six) hours as needed.    . Ascorbic Acid (VITAMIN C PO) Take 1 tablet by mouth daily.     Marland Kitchen aspirin EC 81 MG EC tablet Take 1 tablet (81 mg total) by mouth daily.    Marland Kitchen atorvastatin (LIPITOR) 80 MG tablet Take 1 tablet (80 mg total) by mouth daily at 6 PM. 30 tablet 3  . Cholecalciferol (VITAMIN D PO) Take 1 tablet by mouth daily.     . clopidogrel (PLAVIX) 75 MG tablet Take 1 tablet (75 mg total) by mouth daily. 30 tablet 3  . Multiple Minerals (CALCIUM-MAGNESIUM-ZINC) TABS Take 1 tablet by mouth at bedtime.    . Multiple Vitamin (MULTIVITAMIN) tablet Take 1 tablet by mouth daily.    Marland Kitchen omeprazole (PRILOSEC) 40 MG capsule Take 40 mg by mouth daily.   12  . Probiotic Product (PROBIOTIC DAILY PO) Take 1 tablet by mouth daily.     . ciprofloxacin (CIPRO) 250  MG tablet Take 1 tablet (250 mg total) by mouth 2 (two) times daily. (Patient not taking: Reported on 11/15/2018) 20 tablet 0   No current facility-administered medications on file prior to visit.     Allergies  Allergen Reactions  . Amoxicillin Diarrhea and Nausea And Vomiting    GI intolerance.   . Metoclopramide Hcl Other (See Comments)    Stroke-like symptoms (dose too high?)    Past Medical History:  Diagnosis Date  . Cataract   . Lymphoma (HCC)    rectal cancer  . Myocardial infarction (HCC)   . Osteoporosis    osteopenia    Past Surgical History:  Procedure Laterality Date  . CORONARY ARTERY BYPASS GRAFT N/A 10/05/2018   Procedure: CORONARY ARTERY BYPASS GRAFTING (CABG)x 2,using left internal mammary artery and right leg greater saphenous vein  harvested endoscopically;  Surgeon: Alleen Borne, MD;  Location: [facility] OR;  Service: Cardiothoracic;  Laterality: N/A;  . CORONARY/GRAFT ACUTE MI REVASCULARIZATION N/A 10/05/2018   Procedure: Coronary/Graft Acute MI Revascularization;  Surgeon: Marykay Lex, MD;  Location: [facility] INVASIVE CV LAB;  Service: Cardiovascular;  Laterality: N/A;  . IABP INSERTION N/A 10/05/2018   Procedure: IABP Insertion;  Surgeon: Marykay Lex, MD;  Location: [facility] INVASIVE CV LAB;  Service: Cardiovascular;  Laterality: N/A;  . LEFT HEART CATH AND CORONARY ANGIOGRAPHY N/A 10/05/2018   Procedure: LEFT HEART CATH AND CORONARY ANGIOGRAPHY;  Surgeon: Marykay Lex, MD;  Location: [facility] INVASIVE CV LAB;  Service: Cardiovascular;  Laterality: N/A;    No family history on file.  Social History   Socioeconomic History  . Marital status: Married    Spouse name: Not on file  . Number of children: 5  . Years of education: Not on file  . Highest education level: Not on file  Occupational History  . Occupation: Retired    Associate Professor: RETIRED  Social Needs  . Financial resource strain: Not on file  . Food insecurity:    Worry: Not on file    Inability: Not on file  . Transportation needs:    Medical: Not on file    Non-medical: Not on file  Tobacco Use  . Smoking status: Former Games developer  . Smokeless tobacco: Never Used  Substance and Sexual Activity  . Alcohol use: No  . Drug use: Not on file  . Sexual activity: Not on file  Lifestyle  . Physical activity:    Days per week: Not on file    Minutes per session: Not on file  . Stress: Not on file  Relationships  . Social connections:    Talks on phone: Not on file    Gets together: Not on file    Attends religious service: Not on file    Active member of club or organization: Not on file    Attends meetings of clubs or organizations: Not on file    Relationship status: Not on file  . Intimate partner violence:    Fear of current or ex partner: Not on file     Emotionally abused: Not on file    Physically abused: Not on file    Forced sexual activity: Not on file  Other Topics Concern  . Not on file  Social History Narrative   Desires CPR   Would not want life support prolonged if futile   The PMH, PSH, Social History, Family History, Medications, and allergies have been reviewed in [facility], and have been updated if relevant.  Review of Systems  Constitutional: Positive for fatigue and unexpected weight change.  HENT: Negative.   Respiratory: Positive for shortness of breath.   Cardiovascular: Negative.   Gastrointestinal: Negative.   Endocrine: Negative.   Genitourinary: Positive for frequency and urgency. Negative for decreased urine volume, difficulty urinating, dyspareunia, dysuria, enuresis, flank pain, genital sores, hematuria, menstrual problem, pelvic pain, vaginal bleeding, vaginal discharge and vaginal pain.  Musculoskeletal: Negative.   Allergic/Immunologic: Negative.   Neurological: Positive for weakness. Negative for dizziness, tremors, seizures, syncope, facial asymmetry, speech difficulty, light-headedness, numbness and headaches.  Hematological: Negative.   Psychiatric/Behavioral: Negative.  Negative for agitation, behavioral problems, confusion, decreased concentration, dysphoric mood, hallucinations, self-injury, sleep disturbance and suicidal ideas. The patient is not nervous/anxious and is not hyperactive.   All other systems reviewed and are negative.      Objective:    BP 98/62   Pulse 97   Temp 98.4 F (36.9 C) (Oral)   Wt 162 lb (73.5 kg)   SpO2 98%   BMI 32.17 kg/m    Physical Exam Vitals signs and nursing note reviewed.  Constitutional:      Comments: Appears pale to me today  HENT:     Head: Normocephalic.     Right Ear: External ear normal.     Left Ear: External ear normal.     Nose: Nose normal.  Eyes:     Pupils: Pupils are equal, round, and reactive to light.  Cardiovascular:     Rate and  Rhythm: Normal rate and regular rhythm.     Pulses: Normal pulses.  Pulmonary:     Effort: Pulmonary effort is normal.     Breath sounds: Normal breath sounds.  Musculoskeletal: Normal range of motion.  Skin:    General: Skin is warm and dry.  Neurological:     General: No focal deficit present.     Mental Status: She is oriented to person, place, and time.  Psychiatric:        Mood and Affect: Mood normal.        Behavior: Behavior normal.        Thought Content: Thought content normal.        Judgment: Judgment normal.           Assessment & Plan:   ST elevation myocardial infarction involving left anterior descending (LAD) coronary artery (HCC)  S/P CABG x 2  Coronary artery disease with hx of myocardial infarct w/o hx of CABG  Osteoarthritis, unspecified osteoarthritis type, unspecified site  Gastroesophageal reflux disease without esophagitis  Other fatigue  Decreased appetite No follow-ups on file.

## 2018-11-15 ENCOUNTER — Ambulatory Visit: Payer: Medicare Other | Admitting: Family Medicine

## 2018-11-15 ENCOUNTER — Other Ambulatory Visit: Payer: Self-pay | Admitting: Surgery

## 2018-11-15 ENCOUNTER — Encounter: Payer: Self-pay | Admitting: Family Medicine

## 2018-11-15 VITALS — BP 98/62 | HR 97 | Temp 98.4°F | Wt 162.0 lb

## 2018-11-15 DIAGNOSIS — R5383 Other fatigue: Secondary | ICD-10-CM

## 2018-11-15 DIAGNOSIS — Z951 Presence of aortocoronary bypass graft: Secondary | ICD-10-CM

## 2018-11-15 DIAGNOSIS — I2510 Atherosclerotic heart disease of native coronary artery without angina pectoris: Secondary | ICD-10-CM | POA: Diagnosis not present

## 2018-11-15 DIAGNOSIS — I2102 ST elevation (STEMI) myocardial infarction involving left anterior descending coronary artery: Secondary | ICD-10-CM | POA: Diagnosis not present

## 2018-11-15 DIAGNOSIS — M1990 Unspecified osteoarthritis, unspecified site: Secondary | ICD-10-CM

## 2018-11-15 DIAGNOSIS — K219 Gastro-esophageal reflux disease without esophagitis: Secondary | ICD-10-CM

## 2018-11-15 DIAGNOSIS — R630 Anorexia: Secondary | ICD-10-CM | POA: Diagnosis not present

## 2018-11-15 DIAGNOSIS — I252 Old myocardial infarction: Secondary | ICD-10-CM

## 2018-11-15 LAB — CBC WITH DIFFERENTIAL/PLATELET
Basophils Absolute: 0.1 10*3/uL (ref 0.0–0.1)
Basophils Relative: 1.2 % (ref 0.0–3.0)
Eosinophils Absolute: 0.4 10*3/uL (ref 0.0–0.7)
Eosinophils Relative: 6.9 % — ABNORMAL HIGH (ref 0.0–5.0)
HCT: 39.6 % (ref 36.0–46.0)
Hemoglobin: 13.2 g/dL (ref 12.0–15.0)
Lymphocytes Relative: 22.9 % (ref 12.0–46.0)
Lymphs Abs: 1.3 10*3/uL (ref 0.7–4.0)
MCHC: 33.2 g/dL (ref 30.0–36.0)
MCV: 93 fl (ref 78.0–100.0)
Monocytes Absolute: 0.4 10*3/uL (ref 0.1–1.0)
Monocytes Relative: 7.7 % (ref 3.0–12.0)
Neutro Abs: 3.4 10*3/uL (ref 1.4–7.7)
Neutrophils Relative %: 61.3 % (ref 43.0–77.0)
Platelets: 333 10*3/uL (ref 150.0–400.0)
RBC: 4.25 Mil/uL (ref 3.87–5.11)
RDW: 15.4 % (ref 11.5–15.5)
WBC: 5.5 10*3/uL (ref 4.0–10.5)

## 2018-11-15 LAB — COMPREHENSIVE METABOLIC PANEL
ALT: 13 U/L (ref 0–35)
AST: 20 U/L (ref 0–37)
Albumin: 4 g/dL (ref 3.5–5.2)
Alkaline Phosphatase: 77 U/L (ref 39–117)
BUN: 11 mg/dL (ref 6–23)
CO2: 28 mEq/L (ref 19–32)
Calcium: 10 mg/dL (ref 8.4–10.5)
Chloride: 101 mEq/L (ref 96–112)
Creatinine, Ser: 0.67 mg/dL (ref 0.40–1.20)
GFR: 84.31 mL/min (ref >60.00)
Glucose, Bld: 105 mg/dL — ABNORMAL HIGH (ref 70–99)
Potassium: 3.9 mEq/L (ref 3.5–5.1)
Sodium: 138 mEq/L (ref 135–145)
Total Bilirubin: 0.5 mg/dL (ref 0.2–1.2)
Total Protein: 7.1 g/dL (ref 6.0–8.3)

## 2018-11-15 LAB — VITAMIN D 25 HYDROXY (VIT D DEFICIENCY, FRACTURES): VITD: 64.74 ng/mL (ref 30.00–100.00)

## 2018-11-15 LAB — FERRITIN: Ferritin: 173.1 ng/mL (ref 10.0–291.0)

## 2018-11-15 LAB — BRAIN NATRIURETIC PEPTIDE: Pro B Natriuretic peptide (BNP): 74 pg/mL (ref 0.0–100.0)

## 2018-11-15 LAB — TSH: TSH: 1.93 u[IU]/mL (ref 0.35–4.50)

## 2018-11-15 LAB — H. PYLORI ANTIBODY, IGG: H Pylori IgG: NEGATIVE

## 2018-11-15 NOTE — Assessment & Plan Note (Signed)
>  40 minutes spent in face to face time with patient, >50% spent in counselling or coordination of care discussing recent cardiac history and symptoms she has developed since. Seeing Dr. Cyndia Bent tomorrow.   I am concerned about decreased appetite, fatigue, weight loss, shortness of breath.  Will check additional labs today.  She has not started cardiac rehab.  Denies feeling depressed. The patient indicates understanding of these issues and agrees with the plan. Orders Placed This Encounter  Procedures  . CBC with Differential/Platelet  . Ferritin  . TSH  . H. pylori antibody, IgG  . B Nat Peptide  . Comprehensive metabolic panel  . Vitamin D (25 hydroxy)

## 2018-11-15 NOTE — Patient Instructions (Signed)
Great to see you. I will call you with your lab results from today and you can view them online.   

## 2018-11-16 ENCOUNTER — Ambulatory Visit (INDEPENDENT_AMBULATORY_CARE_PROVIDER_SITE_OTHER): Payer: Self-pay | Admitting: Physician Assistant

## 2018-11-16 ENCOUNTER — Ambulatory Visit
Admission: RE | Admit: 2018-11-16 | Discharge: 2018-11-16 | Disposition: A | Discharge status: Home / Self Care | Payer: Medicare Other | Source: Ambulatory Visit | Attending: Surgery | Admitting: Surgery

## 2018-11-16 VITALS — BP 132/68 | HR 100 | Resp 20 | Ht 59.5 in | Wt 163.0 lb

## 2018-11-16 DIAGNOSIS — Z951 Presence of aortocoronary bypass graft: Secondary | ICD-10-CM

## 2018-11-16 DIAGNOSIS — I2510 Atherosclerotic heart disease of native coronary artery without angina pectoris: Secondary | ICD-10-CM

## 2018-11-16 NOTE — Progress Notes (Signed)
HPI: Molly Chandler is an 84 year old female with a history of osteoarthritis, gastroesophageal reflux disease, and non-Hodgkin's lymphoma that is been in [place] for the past 20 years.  She presented with an acute ST elevation myocardial infarction last month.  She went on to have left heart catheterization that demonstrated a critical left main coronary artery stenosis.  Following the procedure, she was started on heparin drip and an intra-aortic balloon pump was placed.  Her symptoms stabilized.  We proceeded urgently to coronary bypass grafting by Dr. Caffie Pinto.  Her postoperative course was uneventful, she made progressive and satisfactory recovery.  Since echocardiography nor left ventriculogram was obtained prior to surgery, an echo was performed a few days following surgery that showed an ejection fraction of 55 to 60%. Since her discharge home, she is continued to make progress with her mobility.  She has not had any further pain or shortness of breath.  She did complain of having a poor appetite.   Current Outpatient Medications  Medication Sig Dispense Refill  . acetaminophen (TYLENOL) 325 MG tablet Take 650 mg by mouth every 6 (six) hours as needed.    . Ascorbic Acid (VITAMIN C PO) Take 1 tablet by mouth daily.     Marland Kitchen aspirin EC 81 MG EC tablet Take 1 tablet (81 mg total) by mouth daily.    Marland Kitchen atorvastatin (LIPITOR) 80 MG tablet Take 1 tablet (80 mg total) by mouth daily at 6 PM. 30 tablet 3  . Cholecalciferol (VITAMIN D PO) Take 1 tablet by mouth daily.     . clopidogrel (PLAVIX) 75 MG tablet Take 1 tablet (75 mg total) by mouth daily. 30 tablet 3  . Multiple Minerals (CALCIUM-MAGNESIUM-ZINC) TABS Take 1 tablet by mouth at bedtime.    . Multiple Vitamin (MULTIVITAMIN) tablet Take 1 tablet by mouth daily.    Marland Kitchen omeprazole (PRILOSEC) 40 MG capsule Take 40 mg by mouth daily.   12  . Probiotic Product (PROBIOTIC DAILY PO) Take 1 tablet by mouth daily.      No current facility-administered  medications for this visit.     Physical Exam: Vital signs: BP  132/58 p  100 RR  20 SpO2  97%  Heart: Regular rate and rhythm, normal S1-S2 without murmur. Chest breath sounds are clear, full, and equal.  Sternotomy incision is healing with no sign of complication.  Sternum is stable. Extremities; no significant pretibial edema.  She has a superficial abrasion on her right shin from an injury that occurred over the past few days while she was trying to move her dogs gate.  The right EVH incision is intact and healing with no sign of complication.  She has a small, tender knot within the boundary of the EVH tunnel at mid thigh.  There is no erythema.  Diagnostic Tests: EXAM: CHEST - 2 VIEW  COMPARISON:  11/01/2018  FINDINGS: Postsurgical changes from CABG.  Cardiomediastinal silhouette is normal. Mediastinal contours appear intact. Calcific atherosclerotic disease of the aorta.  There is no evidence of pneumothorax. Persistent left pleural effusion and mild left lower lobe atelectasis.  Osseous structures are without acute abnormality. Soft tissues are grossly normal.  IMPRESSION: Persistent left pleural effusion and mild left lower lobe atelectasis.   Electronically Signed   By: Fidela Salisbury M.D.   On: 11/16/2018 13:54    Impression / Plan: Overall, Molly Chandler appears to be making excellent progress following emergency CABG for critical left main coronary artery stenosis after presenting with an acute ST  elevation myocardial infarction.  Lab work ordered by her primary care physician within the past 2 days was reviewed and shows no significant abnormalities on CBC, BMP, TSH, BNP, vitamin D, and H. pylori antigen.  Findings of today's chest x-ray were reviewed with the patient and her daughter.  Medications are reviewed and no changes are indicated from our standpoint.  She is encouraged to continue with progressive ambulation.  She plans to begin cardiac  rehab after her daughter has sufficiently recovered from orthopedic surgery scheduled within the next few days. She would like to follow up with Dr. Cyndia Bent in 1 month and we will schedule that appointment today.  Antony Odea, PA-C [facility] [phone]

## 2018-11-16 NOTE — Patient Instructions (Signed)
Continue with progressive ambulation as instructed. Continue to observe sternal precautions. May proceed with cardiac rehab when her daughter is able to assist her with transportation. Follow-up with Dr. Caffie Pinto in 1 month

## 2018-11-17 ENCOUNTER — Encounter: Payer: Medicare Other | Admitting: Surgery

## 2018-11-24 ENCOUNTER — Telehealth (HOSPITAL_COMMUNITY): Payer: Self-pay

## 2018-11-24 NOTE — Telephone Encounter (Signed)
Called and spoke with pt in regards to CR, adv  we have recv'd the pt referral. And at this time we are not scheduling due to the COVID-19. Once we have resume scheduling we will contact the pt. She/He verbalized understanding.

## 2018-11-25 ENCOUNTER — Telehealth: Payer: Self-pay

## 2018-11-25 NOTE — Telephone Encounter (Signed)
It does seem that she needs to see her cardiologist.  Are they not seeing any patients with acute complaints currently?

## 2018-11-25 NOTE — Telephone Encounter (Signed)
TA-Pt's daughter reached out to us/I have put it in order/Plz read all of the below and advise what I need to tell them to do/I really think that the Cardiologist should have seen her based on the Sx but I have no clue/plz advise/thx dmf   Hi Dr Deborra Medina,   I don't have my mom's (Molly Chandler) my chart info so I am contacting you through mine. She is staying weak and not eating, when she tries to walk she gets dizzy and feels like she's going to pass out. Her cardiologist canceled her appointment (due to covid-19 and we told them how she was feeling and they told us to contact you. So that's what I'm doing. Her blood pressure was 105/80 and pulse 106 yesterday and today it's 115/73 and pulse is 96... what did her blood work show when we were up there and what would you recommend [place] doing ?   Thank you   [name] c/o [name]   Hello Ms. [name],    As we discussed please send me Orthostats on Ms. [name] and I will get them to Dr. Deborra Medina  Blood Pressure and Pulse while laying down; then sitting; then standing :)    Thank you,    Gerarda Fraction.     Hello.. thank you for getting back in touch with [place].   Sitting: 104/73 p: 86  Standing: 107/81 p:103  Laying: 110/66 p:78    I retook the sitting down after all of those and it was -   Sitting: 106/70 p: 94    Let me know your thoughts.   Thank you.   [name]

## 2018-11-25 NOTE — Telephone Encounter (Signed)
TA-I looked at the note from cards where they documented the phone call and screened her but denied a visit/I don't understand? We certainly need to see her if they won't/I don't want anything happening to her/Plz advise what you would like for me to do/thx dmf

## 2018-11-25 NOTE — Telephone Encounter (Signed)
COVID-19 Pre-Screening Questions:  Provider: K. LAWRENCE   Needs f/u  1 MONTH  . Have you been in contact with someone that was recently sick with fever/cough or confirmed to have the [place] virus?  NO  *Contact with a confirmed case should stay at home, away from confirmed patient, monitor symptoms, and reach out to PCP for e-visit/additional testing.  2. Do you have any of the following symptoms [cough, fever (100.4 or greater)], and/or shortness of breath)?  NO  *ALL PTS W/ FEVER SHOULD BE REFERRED TO PCP FOR E-VISIT* _________________________________________________  Cardiac Questionnaire:    Since your last visit or hospitalization:    1. Have you been having chest pain? NO   2. Have you been having shortness of breath? NO   3. Have you been having increasing edema, wt gain, or increase in abdominal girth (pants fitting more tightly)? NO   4. Have you had any passing out spells? NO    *A YES to any of these questions would result in the appointment being kept.  *If all the answers to these questions are NO, we should indicate that given the current situation regarding the worldwide coronarvirus pandemic, at the recommendation of the CDC, we are looking to limit gatherings in our waiting area, and thus will reschedule their appointment beyond four weeks from today.

## 2018-11-26 NOTE — Telephone Encounter (Signed)
Yes would she appropriate for a phone appointment?

## 2018-11-29 ENCOUNTER — Telehealth (INDEPENDENT_AMBULATORY_CARE_PROVIDER_SITE_OTHER): Payer: Medicare Other | Admitting: Family Medicine

## 2018-11-29 DIAGNOSIS — R059 Cough, unspecified: Secondary | ICD-10-CM

## 2018-11-29 DIAGNOSIS — I252 Old myocardial infarction: Secondary | ICD-10-CM

## 2018-11-29 DIAGNOSIS — R05 Cough: Secondary | ICD-10-CM | POA: Diagnosis not present

## 2018-11-29 DIAGNOSIS — I2510 Atherosclerotic heart disease of native coronary artery without angina pectoris: Secondary | ICD-10-CM | POA: Diagnosis not present

## 2018-11-29 DIAGNOSIS — I959 Hypotension, unspecified: Secondary | ICD-10-CM

## 2018-11-29 NOTE — Progress Notes (Signed)
Virtual Visit via Video   I connected with@ on 11/29/18 at 11:20 AM EDT by a video enabled telemedicine application and verified that I am speaking with the correct person using two identifiers. Location patient: Home Location provider: [facility], Office Persons participating in the virtual visit: patient and her daughter, Molly Chandler  I discussed the limitations of evaluation and management by telemedicine and the availability of in person appointments. The patient expressed understanding and agreed to proceed.  Subjective:   HPI:   Received the following my chart message from patient's daughter, Molly Chandler:  Hi Dr Deborra Medina,   I don't have my mom's (Molly Chandler) my chart info so I am contacting you through mine. She is staying weak and not eating, when she tries to walk she gets dizzy and feels like she's going to pass out. Her cardiologist canceled her appointment (due to covid-19 and we told them how she was feeling and they told us to contact you. So that's what I'm doing. Her blood pressure was 105/80 and pulse 106 yesterday and today it's 115/73 and pulse is 96... what did her blood work show when we were up there and what would you recommend [place] doing ?   Thank you   Molly Chandler c/o Molly Chandler   She is not on any rxs for hypertension.  She no longer feels dizzy.  She has had a sore throat and mildly productive cough since yesterday.  No SOB.  No fever.  BP this morning was 106/70 and 110/66  She has been using a vick's type vaporub which has been helping some.   ROS: See pertinent positives and negatives per HPI. No CP No LE edema  Recent Results (from the past 2160 hour(s))  I-STAT creatinine     Status: None   Collection Time: 10/05/18  3:27 AM  Result Value Ref Range   Creatinine, Ser 1.00 0.44 - 1.00 mg/dL  I-STAT 7, (LYTES, BLD GAS, ICA, H+H)     Status: Abnormal   Collection Time: 10/05/18  3:28 AM  Result Value Ref Range   pH, Arterial 7.274 (L)  7.350 - 7.450   pCO2 arterial 45.1 32.0 - 48.0 mmHg   pO2, Arterial 27.0 (LL) 83.0 - 108.0 mmHg   Bicarbonate 20.9 20.0 - 28.0 mmol/L   TCO2 22 22 - 32 mmol/L   O2 Saturation 42.0 %   Acid-base deficit 6.0 (H) 0.0 - 2.0 mmol/L   Sodium 141 135 - 145 mmol/L   Potassium 3.6 3.5 - 5.1 mmol/L   Calcium, Ion 1.16 1.15 - 1.40 mmol/L   HCT 37.0 36.0 - 46.0 %   Hemoglobin 12.6 12.0 - 15.0 g/dL   Patient temperature HIDE    Sample type ARTERIAL   Comprehensive metabolic panel     Status: Abnormal   Collection Time: 10/05/18  3:34 AM  Result Value Ref Range   Sodium 139 135 - 145 mmol/L   Potassium 3.5 3.5 - 5.1 mmol/L   Chloride 107 98 - 111 mmol/L   CO2 19 (L) 22 - 32 mmol/L   Glucose, Bld 162 (H) 70 - 99 mg/dL   BUN 25 (H) 8 - 23 mg/dL   Creatinine, Ser 1.00 0.44 - 1.00 mg/dL   Calcium 8.5 (L) 8.9 - 10.3 mg/dL   Total Protein 6.0 (L) 6.5 - 8.1 g/dL   Albumin 3.4 (L) 3.5 - 5.0 g/dL   AST 20 15 - 41 U/L   ALT 13 0 - 44 U/L  Alkaline Phosphatase 42 38 - 126 U/L   Total Bilirubin 1.0 0.3 - 1.2 mg/dL   GFR calc non Af Amer 53 (L) >60 mL/min   GFR calc Af Amer >60 >60 mL/min   Anion gap 13 5 - 15    Comment: Performed at [facility] [address] [place], [place] [address]  Lipid panel     Status: Abnormal   Collection Time: 10/05/18  3:34 AM  Result Value Ref Range   Cholesterol 194 0 - 200 mg/dL   Triglycerides 56 <150 mg/dL   HDL 48 >40 mg/dL   Total CHOL/HDL Ratio 4.0 RATIO   VLDL 11 0 - 40 mg/dL   LDL Cholesterol 135 (H) 0 - 99 mg/dL    Comment:        Total Cholesterol/HDL:CHD Risk Coronary Heart Disease Risk Table                     Men   Women  1/2 Average Risk   3.4   3.3  Average Risk       5.0   4.4  2 X Average Risk   9.6   7.1  3 X Average Risk  23.4   11.0        Use the calculated Patient Ratio above and the CHD Risk Table to determine the patient's CHD Risk.        ATP III CLASSIFICATION (LDL):  <100     mg/dL   Optimal  100-129  mg/dL   Near  or Above                    Optimal  130-159  mg/dL   Borderline  160-189  mg/dL   High  >190     mg/dL   Very High Performed at [facility] [address], [place], [place] [address]   Hemoglobin A1c     Status: None   Collection Time: 10/05/18  3:34 AM  Result Value Ref Range   Hgb A1c MFr Bld 5.3 4.8 - 5.6 %    Comment: (NOTE) Pre diabetes:          5.7%-6.4% Diabetes:              >6.4% Glycemic control for   <7.0% adults with diabetes    Mean Plasma Glucose 105.41 mg/dL    Comment: Performed at [facility] [address] [place], [place] [address]  Protime-INR     Status: None   Collection Time: 10/05/18  3:34 AM  Result Value Ref Range   Prothrombin Time 13.9 11.4 - 15.2 seconds   INR 1.08     Comment: Performed at [facility] [address], [place], [place] [address]  APTT     Status: Abnormal   Collection Time: 10/05/18  3:34 AM  Result Value Ref Range   aPTT 88 (H) 24 - 36 seconds    Comment:        IF BASELINE aPTT IS ELEVATED, SUGGEST PATIENT RISK ASSESSMENT BE USED TO DETERMINE APPROPRIATE ANTICOAGULANT THERAPY. Performed at [facility], [place] [address], [place], [place] [address]   Troponin I -     Status: Abnormal   Collection Time: 10/05/18  3:34 AM  Result Value Ref Range   Troponin I 0.05 (HH) <0.03 ng/mL    Comment: CRITICAL RESULT CALLED TO, READ BACK BY AND VERIFIED WITH: [facility] RN 10/05/2018 9937  JORDANS Performed at [facility], [place] [address] [place], [place] [address]   Type and screen Pre-op diagnosis: STEMI     Status: None   Collection Time: 10/05/18  3:45 AM  Result Value Ref Range   ABO/RH(D) O POS    Antibody Screen NEG    Sample Expiration 10/08/2018    Unit Number [id number]    Blood Component Type RBC LR PHER1    Unit division 00    Status of Unit ISSUED,FINAL    Transfusion Status OK TO TRANSFUSE    Crossmatch Result Compatible    Unit Number [id number]    Blood Component  Type RBC LR PHER1    Unit division 00    Status of Unit ISSUED,FINAL    Transfusion Status OK TO TRANSFUSE    Crossmatch Result Compatible    Unit Number [id number]    Blood Component Type RED CELLS,LR    Unit division 00    Status of Unit ISSUED,FINAL    Transfusion Status OK TO TRANSFUSE    Crossmatch Result Compatible    Unit Number [id number]    Blood Component Type RED CELLS,LR    Unit division 00    Status of Unit ISSUED,FINAL    Transfusion Status OK TO TRANSFUSE    Crossmatch Result Compatible    Unit Number [id number]    Blood Component Type RBC LR PHER2    Unit division 00    Status of Unit REL FROM [facility]    Transfusion Status OK TO TRANSFUSE    Crossmatch Result Compatible    Unit Number [id number]    Blood Component Type RED CELLS,LR    Unit division 00    Status of Unit REL FROM [facility]    Transfusion Status OK TO TRANSFUSE    Crossmatch Result      Compatible Performed at [facility], [place] [address], [place], [place] [address]   ABO/Rh     Status: None   Collection Time: 10/05/18  3:45 AM  Result Value Ref Range   ABO/RH(D)      O POS Performed at [facility] [address] [place],  [address]   BPAM RBC     Status: None   Collection Time: 10/05/18  3:45 AM  Result Value Ref Range   ISSUE DATE / TIME [id number]    Blood Product Unit Number [id number]    PRODUCT CODE [id number]    Unit Type and Rh 5100    Blood Product Expiration Date 975300511021    ISSUE DATE / TIME 117356701410    Blood Product Unit Number [id number]    PRODUCT CODE [id number]    Unit Type and Rh 5100    Blood Product Expiration Date 156153794327    ISSUE DATE / TIME 614709295747    Blood Product Unit Number [id number]    PRODUCT CODE F8403F54    Unit Type and Rh 5100    Blood Product Expiration Date 360677034035    ISSUE DATE / TIME 248185909311    Blood Product Unit Number [id number]    PRODUCT CODE V7505X83    Unit Type and Rh  5100    Blood Product Expiration Date 358251898421    Blood Product Unit Number [id number]    Unit Type and Rh 5100    Blood Product Expiration Date 736681594707    Blood Product Unit Number [id number]    Unit Type and Rh 5100    Blood Product Expiration Date 978478412820  POCT Activated clotting time     Status: None   Collection Time: 10/05/18  4:03 AM  Result Value Ref Range   Activated Clotting Time 219 seconds  POCT Activated clotting time     Status: None   Collection Time: 10/05/18  4:25 AM  Result Value Ref Range   Activated Clotting Time 290 seconds  Surgical PCR screen     Status: None   Collection Time: 10/05/18  6:29 AM  Result Value Ref Range   MRSA, PCR NEGATIVE NEGATIVE   Staphylococcus aureus NEGATIVE NEGATIVE    Comment: (NOTE) The Xpert SA Assay (FDA approved for NASAL specimens in patients 24 years of age and older), is one component of a comprehensive surveillance program. It is not intended to diagnose infection nor to guide or monitor treatment. Performed at [facility], [place] [address] [place], [place] [address]   Comprehensive metabolic panel     Status: Abnormal   Collection Time: 10/05/18  7:08 AM  Result Value Ref Range   Sodium 138 135 - 145 mmol/L   Potassium 3.7 3.5 - 5.1 mmol/L   Chloride 108 98 - 111 mmol/L   CO2 18 (L) 22 - 32 mmol/L   Glucose, Bld 130 (H) 70 - 99 mg/dL   BUN 24 (H) 8 - 23 mg/dL   Creatinine, Ser 0.81 0.44 - 1.00 mg/dL   Calcium 8.0 (L) 8.9 - 10.3 mg/dL   Total Protein 5.6 (L) 6.5 - 8.1 g/dL   Albumin 3.1 (L) 3.5 - 5.0 g/dL   AST 58 (H) 15 - 41 U/L   ALT 18 0 - 44 U/L   Alkaline Phosphatase 41 38 - 126 U/L   Total Bilirubin 0.8 0.3 - 1.2 mg/dL   GFR calc non Af Amer >60 >60 mL/min   GFR calc Af Amer >60 >60 mL/min   Anion gap 12 5 - 15    Comment: Performed at [facility], [place] [address] [place], [place] [address]  Magnesium     Status: None   Collection Time: 10/05/18  7:08 AM  Result Value  Ref Range   Magnesium 1.8 1.7 - 2.4 mg/dL    Comment: Performed at [facility] [address] [place], [place] [address]  TSH     Status: None   Collection Time: 10/05/18  7:08 AM  Result Value Ref Range   TSH 2.746 0.350 - 4.500 uIU/mL    Comment: Performed by a 3rd Generation assay with a functional sensitivity of <=0.01 uIU/mL. Performed at [facility], [place] [address], [place], [place] [address]   Troponin I - Now Then Q6H     Status: Abnormal   Collection Time: 10/05/18  7:08 AM  Result Value Ref Range   Troponin I 14.05 (HH) <0.03 ng/mL    Comment: CRITICAL RESULT CALLED TO, READ BACK BY AND VERIFIED WITH: R.PREWETT,RN 6314 10/05/2018 CLARK,S Performed at [facility], [place] [address] [place], [place] [address]   Hemoglobin A1c     Status: None   Collection Time: 10/05/18  7:08 AM  Result Value Ref Range   Hgb A1c MFr Bld 5.3 4.8 - 5.6 %    Comment: (NOTE) Pre diabetes:          5.7%-6.4% Diabetes:              >6.4% Glycemic control for   <7.0% adults with diabetes    Mean Plasma Glucose 105.41 mg/dL  Comment: Performed at [facility], [place] [address] [place], [place] [address]  CBC WITH DIFFERENTIAL     Status: Abnormal   Collection Time: 10/05/18  7:08 AM  Result Value Ref Range   WBC 5.7 4.0 - 10.5 K/uL   RBC 4.10 3.87 - 5.11 MIL/uL   Hemoglobin 12.1 12.0 - 15.0 g/dL   HCT 38.9 36.0 - 46.0 %   MCV 94.9 80.0 - 100.0 fL   MCH 29.5 26.0 - 34.0 pg   MCHC 31.1 30.0 - 36.0 g/dL   RDW 13.6 11.5 - 15.5 %   Platelets 146 (L) 150 - 400 K/uL   nRBC 0.0 0.0 - 0.2 %   Neutrophils Relative % 95 %   Neutro Abs 5.4 1.7 - 7.7 K/uL   Lymphocytes Relative 0 %   Lymphs Abs 0.0 (L) 0.7 - 4.0 K/uL   Monocytes Relative 3 %   Monocytes Absolute 0.2 0.1 - 1.0 K/uL   Eosinophils Relative 1 %   Eosinophils Absolute 0.1 0.0 - 0.5 K/uL   Basophils Relative 1 %   Basophils Absolute 0.1 0.0 - 0.1 K/uL   WBC Morphology See Note     Comment: Increased  Bands. >20% Bands   nRBC 0 0 /100 WBC   Abs Immature Granulocytes 0.00 0.00 - 0.07 K/uL    Comment: Performed at [facility], [place] [address] [place], [place] [address]  Protime-INR     Status: None   Collection Time: 10/05/18  7:08 AM  Result Value Ref Range   Prothrombin Time 14.9 11.4 - 15.2 seconds   INR 1.18     Comment: Performed at [facility] [address] [place], [place] [address]  Lactic acid, plasma     Status: None   Collection Time: 10/05/18  7:08 AM  Result Value Ref Range   Lactic Acid, Venous 1.0 0.5 - 1.9 mmol/L    Comment: Performed at [facility] [address] [place], [place] [address]  Lipid panel     Status: Abnormal   Collection Time: 10/05/18  7:08 AM  Result Value Ref Range   Cholesterol 178 0 - 200 mg/dL   Triglycerides 20 <150 mg/dL   HDL 47 >40 mg/dL   Total CHOL/HDL Ratio 3.8 RATIO   VLDL 4 0 - 40 mg/dL   LDL Cholesterol 127 (H) 0 - 99 mg/dL    Comment:        Total Cholesterol/HDL:CHD Risk Coronary Heart Disease Risk Table                     Men   Women  1/2 Average Risk   3.4   3.3  Average Risk       5.0   4.4  2 X Average Risk   9.6   7.1  3 X Average Risk  23.4   11.0        Use the calculated Patient Ratio above and the CHD Risk Table to determine the patient's CHD Risk.        ATP III CLASSIFICATION (LDL):  <100     mg/dL   Optimal  100-129  mg/dL   Near or Above                    Optimal  130-159  mg/dL   Borderline  160-189  mg/dL   High  >190     mg/dL   Very High  Performed at [facility], [place] [address] [place], [place] [address]   APTT     Status: Abnormal   Collection Time: 10/05/18  7:08 AM  Result Value Ref Range   aPTT >200 (HH) 24 - 36 seconds    Comment:        IF BASELINE aPTT IS ELEVATED, SUGGEST PATIENT RISK ASSESSMENT BE USED TO DETERMINE APPROPRIATE ANTICOAGULANT THERAPY. REPEATED TO VERIFY CRITICAL RESULT CALLED TO, READ BACK BY AND VERIFIED WITH: Marylou Mccoy [phone]  09[address] [facility] Performed at [facility], [place] [address], [place], [place] [address]   I-STAT 7, (LYTES, BLD GAS, ICA, H+H)     Status: Abnormal   Collection Time: 10/05/18  7:14 AM  Result Value Ref Range   pH, Arterial 7.341 (L) 7.350 - 7.450   pCO2 arterial 36.2 32.0 - 48.0 mmHg   pO2, Arterial 62.0 (L) 83.0 - 108.0 mmHg   Bicarbonate 19.7 (L) 20.0 - 28.0 mmol/L   TCO2 21 (L) 22 - 32 mmol/L   O2 Saturation 91.0 %   Acid-base deficit 6.0 (H) 0.0 - 2.0 mmol/L   Sodium 141 135 - 145 mmol/L   Potassium 3.9 3.5 - 5.1 mmol/L   Calcium, Ion 1.16 1.15 - 1.40 mmol/L   HCT 36.0 36.0 - 46.0 %   Hemoglobin 12.2 12.0 - 15.0 g/dL   Patient temperature 98.1 F    Sample type ARTERIAL   I-STAT 4, (NA,K, GLUC, HGB,HCT)     Status: Abnormal   Collection Time: 10/05/18  8:49 AM  Result Value Ref Range   Sodium 143 135 - 145 mmol/L   Potassium 3.7 3.5 - 5.1 mmol/L   Glucose, Bld 117 (H) 70 - 99 mg/dL   HCT 28.0 (L) 36.0 - 46.0 %   Hemoglobin 9.5 (L) 12.0 - 15.0 g/dL  POCT I-Stat EG7     Status: Abnormal   Collection Time: 10/05/18  8:55 AM  Result Value Ref Range   pH, Ven 7.272 7.250 - 7.430   pCO2, Ven 41.1 (L) 44.0 - 60.0 mmHg   pO2, Ven 36.0 32.0 - 45.0 mmHg   Bicarbonate 19.0 (L) 20.0 - 28.0 mmol/L   TCO2 20 (L) 22 - 32 mmol/L   O2 Saturation 62.0 %   Acid-base deficit 7.0 (H) 0.0 - 2.0 mmol/L   Sodium 143 135 - 145 mmol/L   Potassium 3.8 3.5 - 5.1 mmol/L   Calcium, Ion 1.14 (L) 1.15 - 1.40 mmol/L   HCT 25.0 (L) 36.0 - 46.0 %   Hemoglobin 8.5 (L) 12.0 - 15.0 g/dL   Patient temperature HIDE    Sample type MIXED VENOUS SAMPLE   Prepare RBC (crossmatch)     Status: None   Collection Time: 10/05/18  9:26 AM  Result Value Ref Range   Order Confirmation      ORDER PROCESSED BY BLOOD BANK Performed at [facility], [address], [place], [place] [address]   ECHO TEE     Status: None   Collection Time: 10/05/18 10:12 AM  Result Value Ref Range   Weight 2,673.74 oz    Height 59.5 in   BP 116/65 mmHg  I-STAT 4, (NA,K, GLUC, HGB,HCT)     Status: Abnormal   Collection Time: 10/05/18 10:25 AM  Result Value Ref Range   Sodium 142 135 - 145 mmol/L   Potassium 4.1 3.5 - 5.1 mmol/L   Glucose, Bld 108 (H) 70 - 99 mg/dL   HCT 27.0 (  L) 36.0 - 46.0 %   Hemoglobin 9.2 (L) 12.0 - 15.0 g/dL  I-STAT 4, (NA,K, GLUC, HGB,HCT)     Status: Abnormal   Collection Time: 10/05/18 10:45 AM  Result Value Ref Range   Sodium 145 135 - 145 mmol/L   Potassium 3.9 3.5 - 5.1 mmol/L   Glucose, Bld 85 70 - 99 mg/dL   HCT 18.0 (L) 36.0 - 46.0 %   Hemoglobin 6.1 (LL) 12.0 - 15.0 g/dL  I-STAT 7, (LYTES, BLD GAS, ICA, H+H)     Status: Abnormal   Collection Time: 10/05/18 10:48 AM  Result Value Ref Range   pH, Arterial 7.352 7.350 - 7.450   pCO2 arterial 42.0 32.0 - 48.0 mmHg   pO2, Arterial 405.0 (H) 83.0 - 108.0 mmHg   Bicarbonate 23.3 20.0 - 28.0 mmol/L   TCO2 25 22 - 32 mmol/L   O2 Saturation 100.0 %   Acid-base deficit 2.0 0.0 - 2.0 mmol/L   Sodium 144 135 - 145 mmol/L   Potassium 3.9 3.5 - 5.1 mmol/L   Calcium, Ion 0.86 (LL) 1.15 - 1.40 mmol/L   HCT 17.0 (L) 36.0 - 46.0 %   Hemoglobin 5.8 (LL) 12.0 - 15.0 g/dL   Patient temperature HIDE    Sample type CARDIOPULMONARY BYPASS   Hemoglobin and hematocrit, blood     Status: Abnormal   Collection Time: 10/05/18 11:14 AM  Result Value Ref Range   Hemoglobin 7.0 (L) 12.0 - 15.0 g/dL    Comment: REPEATED TO VERIFY RESULT CALLED TO, READ BACK BY AND VERIFIED WITH: TICE,M RN @ 1138 10/05/18 LEONARD,A    HCT 22.5 (L) 36.0 - 46.0 %    Comment: Performed at [facility] [address] [place], [place] [address]  Platelet count     Status: Abnormal   Collection Time: 10/05/18 11:14 AM  Result Value Ref Range   Platelets 81 (L) 150 - 400 K/uL    Comment: REPEATED TO VERIFY Immature Platelet Fraction may be clinically indicated, consider ordering this additional test [id number] PLATELET COUNT CONFIRMED BY SMEAR  Performed at [facility], [place] [address] [place], [place] [address]   Prepare Pheresed Platelets     Status: None   Collection Time: 10/05/18 11:45 AM  Result Value Ref Range   Unit Number [id number]    Blood Component Type PLTPH LI2 PAS    Unit division 00    Status of Unit ISSUED,FINAL    Transfusion Status OK TO TRANSFUSE    Unit Number [id number]    Blood Component Type PLTPHER LR1    Unit division 00    Status of Unit REL FROM [facility]    Transfusion Status OK TO TRANSFUSE    Unit Number [id number]    Blood Component Type PLTP LR1 PAS    Unit division 00    Status of Unit ISSUED,FINAL    Transfusion Status      OK TO TRANSFUSE Performed at [facility], [place] [address], [place], [place] [address]   BPAM Platelet Pheresis     Status: None   Collection Time: 10/05/18 11:45 AM  Result Value Ref Range   ISSUE DATE / TIME 005110211173    Blood Product Unit Number [id number]    PRODUCT CODE E7007V00    Unit Type and Rh 4388    Blood Product Expiration Date 875797282060    ISSUE DATE / TIME 156153794327    Blood Product Unit Number [id number]  PRODUCT CODE [id number]    Unit Type and Rh 5100    Blood Product Expiration Date 494496759163    ISSUE DATE / TIME 846659935701    Blood Product Unit Number [id number]    PRODUCT CODE E7002V00    Unit Type and Rh 8400    Blood Product Expiration Date 007622633354   I-STAT 4, (NA,K, GLUC, HGB,HCT)     Status: Abnormal   Collection Time: 10/05/18 12:15 PM  Result Value Ref Range   Sodium 143 135 - 145 mmol/L   Potassium 4.1 3.5 - 5.1 mmol/L   Glucose, Bld 127 (H) 70 - 99 mg/dL   HCT 20.0 (L) 36.0 - 46.0 %   Hemoglobin 6.8 (LL) 12.0 - 15.0 g/dL  I-STAT 7, (LYTES, BLD GAS, ICA, H+H)     Status: Abnormal   Collection Time: 10/05/18 12:21 PM  Result Value Ref Range   pH, Arterial 7.392 7.350 - 7.450   pCO2 arterial 35.6 32.0 - 48.0 mmHg   pO2, Arterial 441.0 (H) 83.0 - 108.0 mmHg   Bicarbonate 21.7  20.0 - 28.0 mmol/L   TCO2 23 22 - 32 mmol/L   O2 Saturation 100.0 %   Acid-base deficit 3.0 (H) 0.0 - 2.0 mmol/L   Sodium 143 135 - 145 mmol/L   Potassium 4.2 3.5 - 5.1 mmol/L   Calcium, Ion 1.26 1.15 - 1.40 mmol/L   HCT 21.0 (L) 36.0 - 46.0 %   Hemoglobin 7.1 (L) 12.0 - 15.0 g/dL   Patient temperature HIDE    Sample type CARDIOPULMONARY BYPASS   Glucose, capillary     Status: Abnormal   Collection Time: 10/05/18  1:41 PM  Result Value Ref Range   Glucose-Capillary 113 (H) 70 - 99 mg/dL  CBC     Status: Abnormal   Collection Time: 10/05/18  1:44 PM  Result Value Ref Range   WBC 3.8 (L) 4.0 - 10.5 K/uL   RBC 3.88 3.87 - 5.11 MIL/uL   Hemoglobin 11.8 (L) 12.0 - 15.0 g/dL    Comment: REPEATED TO VERIFY POST TRANSFUSION SPECIMEN HEART    HCT 36.2 36.0 - 46.0 %   MCV 93.3 80.0 - 100.0 fL   MCH 30.4 26.0 - 34.0 pg   MCHC 32.6 30.0 - 36.0 g/dL   RDW 13.7 11.5 - 15.5 %   Platelets 102 (L) 150 - 400 K/uL    Comment: REPEATED TO VERIFY Immature Platelet Fraction may be clinically indicated, consider ordering this additional test [id number] CONSISTENT WITH PREVIOUS RESULT    nRBC 0.0 0.0 - 0.2 %    Comment: Performed at [facility], [place] [address], [place], [place] [address]  Protime-INR     Status: Abnormal   Collection Time: 10/05/18  1:44 PM  Result Value Ref Range   Prothrombin Time 21.2 (H) 11.4 - 15.2 seconds   INR 1.86     Comment: Performed at [facility] [address] [place], [place] [address]  APTT     Status: Abnormal   Collection Time: 10/05/18  1:44 PM  Result Value Ref Range   aPTT 43 (H) 24 - 36 seconds    Comment:        IF BASELINE aPTT IS ELEVATED, SUGGEST PATIENT RISK ASSESSMENT BE USED TO DETERMINE APPROPRIATE ANTICOAGULANT THERAPY. Performed at [facility], [place] [address], [place], [place] [address]   I-STAT 7, (LYTES, BLD GAS, ICA, H+H)     Status: Abnormal   Collection Time: 10/05/18  2:06 PM  Result Value Ref Range   pH,  Arterial 7.215 (L) 7.350 - 7.450   pCO2 arterial 52.3 (H) 32.0 - 48.0 mmHg   pO2, Arterial 68.0 (L) 83.0 - 108.0 mmHg   Bicarbonate 21.5 20.0 - 28.0 mmol/L   TCO2 23 22 - 32 mmol/L   O2 Saturation 91.0 %   Acid-base deficit 7.0 (H) 0.0 - 2.0 mmol/L   Sodium 144 135 - 145 mmol/L   Potassium 3.8 3.5 - 5.1 mmol/L   Calcium, Ion 1.09 (L) 1.15 - 1.40 mmol/L   HCT 32.0 (L) 36.0 - 46.0 %   Hemoglobin 10.9 (L) 12.0 - 15.0 g/dL   Patient temperature 35.7 C    Sample type ARTERIAL   .Cooxemetry Panel (carboxy, met, total hgb, O2 sat)     Status: None   Collection Time: 10/05/18  2:20 PM  Result Value Ref Range   Total hemoglobin 12.3 12.0 - 16.0 g/dL   O2 Saturation 72.2 %   Carboxyhemoglobin 1.1 0.5 - 1.5 %   Methemoglobin 1.4 0.0 - 1.5 %  I-STAT 7, (LYTES, BLD GAS, ICA, H+H)     Status: Abnormal   Collection Time: 10/05/18  3:15 PM  Result Value Ref Range   pH, Arterial 7.321 (L) 7.350 - 7.450   pCO2 arterial 45.6 32.0 - 48.0 mmHg   pO2, Arterial 64.0 (L) 83.0 - 108.0 mmHg   Bicarbonate 23.9 20.0 - 28.0 mmol/L   TCO2 25 22 - 32 mmol/L   O2 Saturation 92.0 %   Acid-base deficit 3.0 (H) 0.0 - 2.0 mmol/L   Sodium 146 (H) 135 - 145 mmol/L   Potassium 3.7 3.5 - 5.1 mmol/L   Calcium, Ion 0.99 (L) 1.15 - 1.40 mmol/L   HCT 31.0 (L) 36.0 - 46.0 %   Hemoglobin 10.5 (L) 12.0 - 15.0 g/dL   Patient temperature 35.7 C    Collection site RADIAL, ALLEN'S TEST ACCEPTABLE    Sample type ARTERIAL   Glucose, capillary     Status: Abnormal   Collection Time: 10/05/18  3:20 PM  Result Value Ref Range   Glucose-Capillary 115 (H) 70 - 99 mg/dL  Glucose, capillary     Status: None   Collection Time: 10/05/18  4:03 PM  Result Value Ref Range   Glucose-Capillary 86 70 - 99 mg/dL  Glucose, capillary     Status: Abnormal   Collection Time: 10/05/18  4:57 PM  Result Value Ref Range   Glucose-Capillary 110 (H) 70 - 99 mg/dL  Glucose, capillary     Status: Abnormal   Collection Time: 10/05/18  6:21 PM   Result Value Ref Range   Glucose-Capillary 110 (H) 70 - 99 mg/dL  Glucose, capillary     Status: Abnormal   Collection Time: 10/05/18  6:59 PM  Result Value Ref Range   Glucose-Capillary 104 (H) 70 - 99 mg/dL  Glucose, capillary     Status: Abnormal   Collection Time: 10/05/18  7:50 PM  Result Value Ref Range   Glucose-Capillary 122 (H) 70 - 99 mg/dL  I-STAT 7, (LYTES, BLD GAS, ICA, H+H)     Status: Abnormal   Collection Time: 10/05/18  7:52 PM  Result Value Ref Range   pH, Arterial 7.383 7.350 - 7.450   pCO2 arterial 43.5 32.0 - 48.0 mmHg   pO2, Arterial 117.0 (H) 83.0 - 108.0 mmHg   Bicarbonate 25.9 20.0 - 28.0 mmol/L   TCO2 27 22 - 32 mmol/L   O2 Saturation 98.0 %  Acid-Base Excess 1.0 0.0 - 2.0 mmol/L   Sodium 146 (H) 135 - 145 mmol/L   Potassium 4.0 3.5 - 5.1 mmol/L   Calcium, Ion 1.04 (L) 1.15 - 1.40 mmol/L   HCT 29.0 (L) 36.0 - 46.0 %   Hemoglobin 9.9 (L) 12.0 - 15.0 g/dL   Patient temperature HIDE    Sample type ARTERIAL   CBC     Status: Abnormal   Collection Time: 10/05/18  7:53 PM  Result Value Ref Range   WBC 4.1 4.0 - 10.5 K/uL   RBC 3.42 (L) 3.87 - 5.11 MIL/uL   Hemoglobin 10.3 (L) 12.0 - 15.0 g/dL    Comment: REPEATED TO VERIFY   HCT 31.2 (L) 36.0 - 46.0 %   MCV 91.2 80.0 - 100.0 fL   MCH 30.1 26.0 - 34.0 pg   MCHC 33.0 30.0 - 36.0 g/dL   RDW 14.2 11.5 - 15.5 %   Platelets 93 (L) 150 - 400 K/uL    Comment: REPEATED TO VERIFY PLATELET COUNT CONFIRMED BY SMEAR Immature Platelet Fraction may be clinically indicated, consider ordering this additional test [id number] CONSISTENT WITH PREVIOUS RESULT    nRBC 0.0 0.0 - 0.2 %    Comment: Performed at [facility], [place] [address] [place], [place] [address]  Creatinine, serum     Status: None   Collection Time: 10/05/18  7:53 PM  Result Value Ref Range   Creatinine, Ser 0.74 0.44 - 1.00 mg/dL   GFR calc non Af Amer >60 >60 mL/min   GFR calc Af Amer >60 >60 mL/min    Comment: Performed at [facility] [address] [place],  [address]  Magnesium     Status: Abnormal   Collection Time: 10/05/18  7:53 PM  Result Value Ref Range   Magnesium 2.7 (H) 1.7 - 2.4 mg/dL    Comment: Performed at [facility] [address] [place], [place] [address]  Glucose, capillary     Status: Abnormal   Collection Time: 10/05/18 11:33 PM  Result Value Ref Range   Glucose-Capillary 112 (H) 70 - 99 mg/dL  Blood gas, arterial     Status: Abnormal   Collection Time: 10/06/18  3:25 AM  Result Value Ref Range   FIO2 50.00    Delivery systems VENTILATOR    Mode PRESSURE REGULATED VOLUME CONTROL    VT 350 mL   LHR 16 resp/min   Peep/cpap 5.0 cm H20   pH, Arterial 7.396 7.350 - 7.450   pCO2 arterial 39.2 32.0 - 48.0 mmHg   pO2, Arterial 189 (H) 83.0 - 108.0 mmHg   Bicarbonate 23.6 20.0 - 28.0 mmol/L   Acid-base deficit 0.6 0.0 - 2.0 mmol/L   O2 Saturation 99.4 %   Patient temperature 98.6    Collection site A-LINE    Drawn by [phone]    Sample type ARTERIAL DRAW    Allens test (pass/fail) PASS PASS  Cooxemetry Panel (carboxy, met, total hgb, O2 sat)     Status: Abnormal   Collection Time: 10/06/18  3:25 AM  Result Value Ref Range   Total hemoglobin 10.5 (L) 12.0 - 16.0 g/dL   O2 Saturation 72.2 %   Carboxyhemoglobin 1.3 0.5 - 1.5 %   Methemoglobin 1.2 0.0 - 1.5 %  Glucose, capillary     Status: Abnormal   Collection Time: 10/06/18  3:35 AM  Result Value Ref Range   Glucose-Capillary 110 (H) 70 - 99 mg/dL  CBC     Status: Abnormal   Collection Time: 10/06/18  3:36 AM  Result Value Ref Range   WBC 3.7 (L) 4.0 - 10.5 K/uL   RBC 3.28 (L) 3.87 - 5.11 MIL/uL   Hemoglobin 9.9 (L) 12.0 - 15.0 g/dL   HCT 29.9 (L) 36.0 - 46.0 %   MCV 91.2 80.0 - 100.0 fL   MCH 30.2 26.0 - 34.0 pg   MCHC 33.1 30.0 - 36.0 g/dL   RDW 14.3 11.5 - 15.5 %   Platelets 74 (L) 150 - 400 K/uL    Comment: REPEATED TO VERIFY Immature Platelet Fraction may be clinically indicated, consider ordering this  additional test [id number] CONSISTENT WITH PREVIOUS RESULT    nRBC 0.0 0.0 - 0.2 %    Comment: Performed at [facility], [place] [address] [place], [place] [address]  Basic metabolic panel     Status: Abnormal   Collection Time: 10/06/18  3:36 AM  Result Value Ref Range   Sodium 142 135 - 145 mmol/L   Potassium 3.9 3.5 - 5.1 mmol/L   Chloride 113 (H) 98 - 111 mmol/L   CO2 23 22 - 32 mmol/L   Glucose, Bld 115 (H) 70 - 99 mg/dL   BUN 16 8 - 23 mg/dL   Creatinine, Ser 0.71 0.44 - 1.00 mg/dL   Calcium 8.1 (L) 8.9 - 10.3 mg/dL   GFR calc non Af Amer >60 >60 mL/min   GFR calc Af Amer >60 >60 mL/min   Anion gap 6 5 - 15    Comment: Performed at [facility], [place] [address], [place], [place] [address]  Magnesium     Status: Abnormal   Collection Time: 10/06/18  3:36 AM  Result Value Ref Range   Magnesium 2.5 (H) 1.7 - 2.4 mg/dL    Comment: Performed at [facility] [address], [place], [place] [address]  Glucose, capillary     Status: Abnormal   Collection Time: 10/06/18  7:27 AM  Result Value Ref Range   Glucose-Capillary 112 (H) 70 - 99 mg/dL  I-STAT 7, (LYTES, BLD GAS, ICA, H+H)     Status: Abnormal   Collection Time: 10/06/18 10:29 AM  Result Value Ref Range   pH, Arterial 7.327 (L) 7.350 - 7.450   pCO2 arterial 41.5 32.0 - 48.0 mmHg   pO2, Arterial 123.0 (H) 83.0 - 108.0 mmHg   Bicarbonate 21.7 20.0 - 28.0 mmol/L   TCO2 23 22 - 32 mmol/L   O2 Saturation 98.0 %   Acid-base deficit 4.0 (H) 0.0 - 2.0 mmol/L   Sodium 147 (H) 135 - 145 mmol/L   Potassium 3.6 3.5 - 5.1 mmol/L   Calcium, Ion 1.17 1.15 - 1.40 mmol/L   HCT 24.0 (L) 36.0 - 46.0 %   Hemoglobin 8.2 (L) 12.0 - 15.0 g/dL   Patient temperature HIDE    Sample type ARTERIAL   Glucose, capillary     Status: Abnormal   Collection Time: 10/06/18 11:40 AM  Result Value Ref Range   Glucose-Capillary 108 (H) 70 - 99 mg/dL  I-STAT 7, (LYTES, BLD GAS, ICA, H+H)     Status: Abnormal   Collection Time:  10/06/18 12:09 PM  Result Value Ref Range   pH, Arterial 7.347 (L) 7.350 - 7.450   pCO2 arterial 41.1 32.0 - 48.0 mmHg   pO2, Arterial 84.0 83.0 - 108.0 mmHg   Bicarbonate 22.5 20.0 - 28.0 mmol/L   TCO2 24  22 - 32 mmol/L   O2 Saturation 96.0 %   Acid-base deficit 3.0 (H) 0.0 - 2.0 mmol/L   Sodium 146 (H) 135 - 145 mmol/L   Potassium 3.9 3.5 - 5.1 mmol/L   Calcium, Ion 1.21 1.15 - 1.40 mmol/L   HCT 25.0 (L) 36.0 - 46.0 %   Hemoglobin 8.5 (L) 12.0 - 15.0 g/dL   Patient temperature HIDE    Sample type ARTERIAL   Glucose, capillary     Status: Abnormal   Collection Time: 10/06/18  4:00 PM  Result Value Ref Range   Glucose-Capillary 114 (H) 70 - 99 mg/dL  Basic metabolic panel     Status: Abnormal   Collection Time: 10/06/18  5:00 PM  Result Value Ref Range   Sodium 142 135 - 145 mmol/L   Potassium 3.9 3.5 - 5.1 mmol/L   Chloride 113 (H) 98 - 111 mmol/L   CO2 22 22 - 32 mmol/L   Glucose, Bld 130 (H) 70 - 99 mg/dL   BUN 15 8 - 23 mg/dL   Creatinine, Ser 0.69 0.44 - 1.00 mg/dL   Calcium 8.2 (L) 8.9 - 10.3 mg/dL   GFR calc non Af Amer >60 >60 mL/min   GFR calc Af Amer >60 >60 mL/min   Anion gap 7 5 - 15    Comment: Performed at [facility], [place] [address] [place], [place] [address]  Magnesium     Status: None   Collection Time: 10/06/18  5:00 PM  Result Value Ref Range   Magnesium 2.1 1.7 - 2.4 mg/dL    Comment: Performed at [facility], [place] [address], [place], [place] [address]  CBC     Status: Abnormal   Collection Time: 10/06/18  5:00 PM  Result Value Ref Range   WBC 6.2 4.0 - 10.5 K/uL   RBC 3.17 (L) 3.87 - 5.11 MIL/uL   Hemoglobin 9.8 (L) 12.0 - 15.0 g/dL   HCT 29.2 (L) 36.0 - 46.0 %   MCV 92.1 80.0 - 100.0 fL   MCH 30.9 26.0 - 34.0 pg   MCHC 33.6 30.0 - 36.0 g/dL   RDW 14.4 11.5 - 15.5 %   Platelets 60 (L) 150 - 400 K/uL    Comment: REPEATED TO VERIFY Immature Platelet Fraction may be clinically indicated, consider ordering this additional test  [id number] CONSISTENT WITH PREVIOUS RESULT    nRBC 0.0 0.0 - 0.2 %    Comment: Performed at [facility], [place] [address], [place], [place] [address]  Glucose, capillary     Status: Abnormal   Collection Time: 10/06/18  7:44 PM  Result Value Ref Range   Glucose-Capillary 113 (H) 70 - 99 mg/dL  Glucose, capillary     Status: Abnormal   Collection Time: 10/06/18 11:39 PM  Result Value Ref Range   Glucose-Capillary 112 (H) 70 - 99 mg/dL  Glucose, capillary     Status: None   Collection Time: 10/07/18  4:46 AM  Result Value Ref Range   Glucose-Capillary 91 70 - 99 mg/dL  .Cooxemetry Panel (carboxy, met, total hgb, O2 sat)     Status: Abnormal   Collection Time: 10/07/18  5:25 AM  Result Value Ref Range   Total hemoglobin 8.4 (L) 12.0 - 16.0 g/dL   O2 Saturation 81.1 %   Carboxyhemoglobin 1.1 0.5 - 1.5 %   Methemoglobin 1.9 (H) 0.0 - 1.5 %    Comment: Performed at [facility], [address] [place], [place] [address]  CBC     Status: Abnormal   Collection Time: 10/07/18  6:02 AM  Result Value Ref Range   WBC 6.6 4.0 - 10.5 K/uL   RBC 2.97 (L) 3.87 - 5.11 MIL/uL   Hemoglobin 9.1 (L) 12.0 - 15.0 g/dL   HCT 28.0 (L) 36.0 - 46.0 %   MCV 94.3 80.0 - 100.0 fL   MCH 30.6 26.0 - 34.0 pg   MCHC 32.5 30.0 - 36.0 g/dL   RDW 14.6 11.5 - 15.5 %   Platelets 58 (L) 150 - 400 K/uL    Comment: REPEATED TO VERIFY Immature Platelet Fraction may be clinically indicated, consider ordering this additional test [id number] CONSISTENT WITH PREVIOUS RESULT    nRBC 0.0 0.0 - 0.2 %    Comment: Performed at [facility], [place] [address], [place], [place] [address]  Basic metabolic panel     Status: Abnormal   Collection Time: 10/07/18  6:02 AM  Result Value Ref Range   Sodium 142 135 - 145 mmol/L   Potassium 3.6 3.5 - 5.1 mmol/L   Chloride 111 98 - 111 mmol/L   CO2 26 22 - 32 mmol/L   Glucose, Bld 103 (H) 70 - 99 mg/dL   BUN 16 8 - 23 mg/dL   Creatinine, Ser 0.73 0.44 - 1.00  mg/dL   Calcium 7.6 (L) 8.9 - 10.3 mg/dL   GFR calc non Af Amer >60 >60 mL/min   GFR calc Af Amer >60 >60 mL/min   Anion gap 5 5 - 15    Comment: Performed at [facility], [place] [address] [place], [place] [address]  Glucose, capillary     Status: None   Collection Time: 10/07/18  7:24 AM  Result Value Ref Range   Glucose-Capillary 96 70 - 99 mg/dL  Glucose, capillary     Status: None   Collection Time: 10/07/18 11:01 AM  Result Value Ref Range   Glucose-Capillary 86 70 - 99 mg/dL  Glucose, capillary     Status: None   Collection Time: 10/07/18  4:07 PM  Result Value Ref Range   Glucose-Capillary 79 70 - 99 mg/dL  Glucose, capillary     Status: Abnormal   Collection Time: 10/07/18  7:36 PM  Result Value Ref Range   Glucose-Capillary 69 (L) 70 - 99 mg/dL  Basic metabolic panel     Status: Abnormal   Collection Time: 10/08/18  6:18 AM  Result Value Ref Range   Sodium 141 135 - 145 mmol/L   Potassium 4.1 3.5 - 5.1 mmol/L   Chloride 107 98 - 111 mmol/L   CO2 28 22 - 32 mmol/L   Glucose, Bld 91 70 - 99 mg/dL   BUN 14 8 - 23 mg/dL   Creatinine, Ser 0.63 0.44 - 1.00 mg/dL   Calcium 8.2 (L) 8.9 - 10.3 mg/dL   GFR calc non Af Amer >60 >60 mL/min   GFR calc Af Amer >60 >60 mL/min   Anion gap 6 5 - 15    Comment: Performed at [facility], [place] [address], [place], [place] [address]  CBC     Status: Abnormal   Collection Time: 10/08/18  6:18 AM  Result Value Ref Range   WBC 7.1 4.0 - 10.5 K/uL   RBC 2.97 (L) 3.87 - 5.11 MIL/uL   Hemoglobin 9.0 (L) 12.0 - 15.0 g/dL   HCT 28.2 (L) 36.0 - 46.0 %  MCV 94.9 80.0 - 100.0 fL   MCH 30.3 26.0 - 34.0 pg   MCHC 31.9 30.0 - 36.0 g/dL   RDW 13.8 11.5 - 15.5 %   Platelets 69 (L) 150 - 400 K/uL    Comment: REPEATED TO VERIFY PLATELET COUNT CONFIRMED BY SMEAR Immature Platelet Fraction may be clinically indicated, consider ordering this additional test [id number] CONSISTENT WITH PREVIOUS RESULT    nRBC 0.0 0.0 - 0.2 %     Comment: Performed at [facility], [place] [address] [place], [place] [address]  Basic metabolic panel     Status: Abnormal   Collection Time: 10/09/18  5:27 AM  Result Value Ref Range   Sodium 140 135 - 145 mmol/L   Potassium 4.4 3.5 - 5.1 mmol/L   Chloride 105 98 - 111 mmol/L   CO2 27 22 - 32 mmol/L   Glucose, Bld 100 (H) 70 - 99 mg/dL   BUN 16 8 - 23 mg/dL   Creatinine, Ser 0.62 0.44 - 1.00 mg/dL   Calcium 8.3 (L) 8.9 - 10.3 mg/dL   GFR calc non Af Amer >60 >60 mL/min   GFR calc Af Amer >60 >60 mL/min   Anion gap 8 5 - 15    Comment: Performed at [facility] [address] [place], [place] [address]  CBC     Status: Abnormal   Collection Time: 10/09/18  5:27 AM  Result Value Ref Range   WBC 5.1 4.0 - 10.5 K/uL   RBC 3.15 (L) 3.87 - 5.11 MIL/uL   Hemoglobin 9.4 (L) 12.0 - 15.0 g/dL   HCT 29.9 (L) 36.0 - 46.0 %   MCV 94.9 80.0 - 100.0 fL   MCH 29.8 26.0 - 34.0 pg   MCHC 31.4 30.0 - 36.0 g/dL   RDW 13.7 11.5 - 15.5 %   Platelets 88 (L) 150 - 400 K/uL    Comment: REPEATED TO VERIFY Immature Platelet Fraction may be clinically indicated, consider ordering this additional test [id number] CONSISTENT WITH PREVIOUS RESULT    nRBC 0.0 0.0 - 0.2 %    Comment: Performed at [facility], [place] [address] [place] [address], [place], [place] [address]  CBC     Status: Abnormal   Collection Time: 10/10/18  7:23 AM  Result Value Ref Range   WBC 5.0 4.0 - 10.5 K/uL   RBC 3.62 (L) 3.87 - 5.11 MIL/uL   Hemoglobin 11.2 (L) 12.0 - 15.0 g/dL   HCT 34.1 (L) 36.0 - 46.0 %   MCV 94.2 80.0 - 100.0 fL   MCH 30.9 26.0 - 34.0 pg   MCHC 32.8 30.0 - 36.0 g/dL   RDW 13.6 11.5 - 15.5 %   Platelets 125 (L) 150 - 400 K/uL   nRBC 0.0 0.0 - 0.2 %    Comment: Performed at [facility], [place] [address] [place], [place] [address]  Basic metabolic panel     Status: Abnormal   Collection Time: 10/10/18  7:23 AM  Result Value Ref Range   Sodium 140 135 - 145 mmol/L   Potassium 4.4 3.5 - 5.1 mmol/L    Chloride 105 98 - 111 mmol/L   CO2 25 22 - 32 mmol/L   Glucose, Bld 93 70 - 99 mg/dL   BUN 14 8 - 23 mg/dL   Creatinine, Ser 0.70 0.44 - 1.00 mg/dL   Calcium 8.8 (L) 8.9 - 10.3 mg/dL   GFR calc non Af Amer >60 >60 mL/min  GFR calc Af Amer >60 >60 mL/min   Anion gap 10 5 - 15    Comment: Performed at [facility] [address], [place], [place] [address]  Glucose, capillary     Status: Abnormal   Collection Time: 10/11/18  1:55 PM  Result Value Ref Range   Glucose-Capillary 105 (H) 70 - 99 mg/dL  ECHOCARDIOGRAM LIMITED     Status: None   Collection Time: 10/11/18  2:37 PM  Result Value Ref Range   Weight 2,846.58 oz   Height 59.5 in   BP 83/54 mmHg  Troponin I - Now Then Q6H     Status: Abnormal   Collection Time: 10/11/18  2:52 PM  Result Value Ref Range   Troponin I 1.07 (HH) <0.03 ng/mL    Comment: CRITICAL RESULT CALLED TO, READ BACK BY AND VERIFIED WITH: Ignatius Specking 1657 10/11/2018 WBOND Performed at [facility], [place] [address] [place], [place] [address]   CBC     Status: Abnormal   Collection Time: 10/11/18  2:52 PM  Result Value Ref Range   WBC 6.2 4.0 - 10.5 K/uL   RBC 3.78 (L) 3.87 - 5.11 MIL/uL   Hemoglobin 11.7 (L) 12.0 - 15.0 g/dL   HCT 34.9 (L) 36.0 - 46.0 %   MCV 92.3 80.0 - 100.0 fL   MCH 31.0 26.0 - 34.0 pg   MCHC 33.5 30.0 - 36.0 g/dL   RDW 13.4 11.5 - 15.5 %   Platelets 175 150 - 400 K/uL   nRBC 0.0 0.0 - 0.2 %    Comment: Performed at [facility], [place] [address] [place], [place] [address]  Troponin I - Now Then Q6H     Status: Abnormal   Collection Time: 10/11/18  8:21 PM  Result Value Ref Range   Troponin I 0.92 (HH) <0.03 ng/mL    Comment: CRITICAL VALUE NOTED.  VALUE IS CONSISTENT WITH PREVIOUSLY REPORTED AND CALLED VALUE. Performed at [facility], [place] [address] [place], [place] [address]   Troponin I - Now Then Q6H     Status: Abnormal   Collection Time: 10/12/18  3:24 AM  Result Value Ref Range   Troponin  I 0.71 (HH) <0.03 ng/mL    Comment: CRITICAL VALUE NOTED.  VALUE IS CONSISTENT WITH PREVIOUSLY REPORTED AND CALLED VALUE. Performed at [facility], [place] [address], [place], [place] [address]   CBC     Status: Abnormal   Collection Time: 10/12/18  3:24 AM  Result Value Ref Range   WBC 6.7 4.0 - 10.5 K/uL   RBC 3.45 (L) 3.87 - 5.11 MIL/uL   Hemoglobin 10.5 (L) 12.0 - 15.0 g/dL   HCT 32.0 (L) 36.0 - 46.0 %   MCV 92.8 80.0 - 100.0 fL   MCH 30.4 26.0 - 34.0 pg   MCHC 32.8 30.0 - 36.0 g/dL   RDW 13.2 11.5 - 15.5 %   Platelets 184 150 - 400 K/uL   nRBC 0.0 0.0 - 0.2 %    Comment: Performed at [facility], [place] [address], [place], [place] [address]  Basic metabolic panel     Status: Abnormal   Collection Time: 10/12/18  3:24 AM  Result Value Ref Range   Sodium 138 135 - 145 mmol/L   Potassium 4.0 3.5 - 5.1 mmol/L   Chloride 103 98 - 111 mmol/L   CO2 23 22 - 32 mmol/L   Glucose, Bld 83  70 - 99 mg/dL   BUN 17 8 - 23 mg/dL   Creatinine, Ser 0.78 0.44 - 1.00 mg/dL   Calcium 8.4 (L) 8.9 - 10.3 mg/dL   GFR calc non Af Amer >60 >60 mL/min   GFR calc Af Amer >60 >60 mL/min   Anion gap 12 5 - 15    Comment: Performed at [facility] [address] [place],  [address]  Urinalysis     Status: Abnormal   Collection Time: 11/01/18  4:13 PM  Result Value Ref Range   Specific Gravity, UA 1.018 1.005 - 1.030   pH, UA 5.5 5.0 - 7.5   Color, UA Yellow Yellow   Appearance Ur Turbid (A) Clear   Leukocytes, UA 3+ (A) Negative   Protein, UA 1+ (A) Negative/Trace   Glucose, UA Negative Negative   Ketones, UA 1+ (A) Negative   RBC, UA 1+ (A) Negative   Bilirubin, UA Negative Negative   Urobilinogen, Ur 0.2 0.2 - 1.0 mg/dL   Nitrite, UA Negative Negative  CBC     Status: None   Collection Time: 11/01/18  4:13 PM  Result Value Ref Range   WBC 8.7 3.4 - 10.8 x10E3/uL   RBC 4.09 3.77 - 5.28 x10E6/uL   Hemoglobin 12.6 11.1 - 15.9 g/dL   Hematocrit 37.4 34.0 - 46.6 %    MCV 91 79 - 97 fL   MCH 30.8 26.6 - 33.0 pg   MCHC 33.7 31.5 - 35.7 g/dL   RDW 13.5 11.7 - 15.4 %   Platelets 211 150 - 450 [id number]  Basic metabolic panel     Status: None   Collection Time: 11/01/18  4:13 PM  Result Value Ref Range   Glucose 97 65 - 99 mg/dL   BUN 12 8 - 27 mg/dL   Creatinine, Ser 0.66 0.57 - 1.00 mg/dL   GFR calc non Af Amer 83 >59 mL/min/1.73   GFR calc Af Amer 96 >59 mL/min/1.73   BUN/Creatinine Ratio 18 12 - 28   Sodium 137 134 - 144 mmol/L   Potassium 4.7 3.5 - 5.2 mmol/L   Chloride 100 96 - 106 mmol/L   CO2 23 20 - 29 mmol/L   Calcium 9.4 8.7 - 10.3 mg/dL  CBC with Differential/Platelet     Status: Abnormal   Collection Time: 11/15/18  1:46 PM  Result Value Ref Range   WBC 5.5 4.0 - 10.5 K/uL   RBC 4.25 3.87 - 5.11 Mil/uL   Hemoglobin 13.2 12.0 - 15.0 g/dL   HCT 39.6 36.0 - 46.0 %   MCV 93.0 78.0 - 100.0 fl   MCHC 33.2 30.0 - 36.0 g/dL   RDW 15.4 11.5 - 15.5 %   Platelets 333.0 150.0 - 400.0 K/uL   Neutrophils Relative % 61.3 43.0 - 77.0 %   Lymphocytes Relative 22.9 12.0 - 46.0 %   Monocytes Relative 7.7 3.0 - 12.0 %   Eosinophils Relative 6.9 (H) 0.0 - 5.0 %   Basophils Relative 1.2 0.0 - 3.0 %   Neutro Abs 3.4 1.4 - 7.7 K/uL   Lymphs Abs 1.3 0.7 - 4.0 K/uL   Monocytes Absolute 0.4 0.1 - 1.0 K/uL   Eosinophils Absolute 0.4 0.0 - 0.7 K/uL   Basophils Absolute 0.1 0.0 - 0.1 K/uL  Ferritin     Status: None   Collection Time: 11/15/18  1:46 PM  Result Value Ref Range   Ferritin 173.1 10.0 - 291.0 ng/mL  TSH  Status: None   Collection Time: 11/15/18  1:46 PM  Result Value Ref Range   TSH 1.93 0.35 - 4.50 uIU/mL  H. pylori antibody, IgG     Status: None   Collection Time: 11/15/18  1:46 PM  Result Value Ref Range   H Pylori IgG Negative Negative  B Nat Peptide     Status: None   Collection Time: 11/15/18  1:46 PM  Result Value Ref Range   Pro B Natriuretic peptide (BNP) 74.0 0.0 - 100.0 pg/mL  Comprehensive metabolic panel     Status:  Abnormal   Collection Time: 11/15/18  1:46 PM  Result Value Ref Range   Sodium 138 135 - 145 mEq/L   Potassium 3.9 3.5 - 5.1 mEq/L   Chloride 101 96 - 112 mEq/L   CO2 28 19 - 32 mEq/L   Glucose, Bld 105 (H) 70 - 99 mg/dL   BUN 11 6 - 23 mg/dL   Creatinine, Ser 0.67 0.40 - 1.20 mg/dL   Total Bilirubin 0.5 0.2 - 1.2 mg/dL   Alkaline Phosphatase 77 39 - 117 U/L   AST 20 0 - 37 U/L   ALT 13 0 - 35 U/L   Total Protein 7.1 6.0 - 8.3 g/dL   Albumin 4.0 3.5 - 5.2 g/dL   Calcium 10.0 8.4 - 10.5 mg/dL   GFR 84.31 >60.00 mL/min  Vitamin D (25 hydroxy)     Status: None   Collection Time: 11/15/18  1:46 PM  Result Value Ref Range   VITD 64.74 30.00 - 100.00 ng/mL     Patient Active Problem List   Diagnosis Date Noted  . Hypotension 11/29/2018  . Cough 11/29/2018  . Fatigue 11/15/2018  . Decreased appetite 11/15/2018  . Coronary artery disease with hx of myocardial infarct w/o hx of CABG 11/12/2018  . STEMI (ST elevation myocardial infarction) ([place]) 10/05/2018  . Acute ST elevation myocardial infarction (STEMI) involving left main coronary artery ([place]) 10/05/2018  . S/P CABG x 2 10/05/2018  . Osteoarthritis 02/05/2018  . Osteoporosis 02/05/2018  . GERD (gastroesophageal reflux disease) 09/26/2011  . Lymphoma ([place]) 05/30/2010    Social History   Tobacco Use  . Smoking status: Former Research scientist (life sciences)  . Smokeless tobacco: Never Used  Substance Use Topics  . Alcohol use: No    Current Outpatient Medications:  .  acetaminophen (TYLENOL) 325 MG tablet, Take 650 mg by mouth every 6 (six) hours as needed., Disp: , Rfl:  .  Ascorbic Acid (VITAMIN C PO), Take 1 tablet by mouth daily. , Disp: , Rfl:  .  aspirin EC 81 MG EC tablet, Take 1 tablet (81 mg total) by mouth daily., Disp: , Rfl:  .  atorvastatin (LIPITOR) 80 MG tablet, Take 1 tablet (80 mg total) by mouth daily at 6 PM., Disp: 30 tablet, Rfl: 3 .  Cholecalciferol (VITAMIN D PO), Take 1 tablet by mouth daily. , Disp: , Rfl:  .   clopidogrel (PLAVIX) 75 MG tablet, Take 1 tablet (75 mg total) by mouth daily., Disp: 30 tablet, Rfl: 3 .  Multiple Minerals (CALCIUM-MAGNESIUM-ZINC) TABS, Take 1 tablet by mouth at bedtime., Disp: , Rfl:  .  Multiple Vitamin (MULTIVITAMIN) tablet, Take 1 tablet by mouth daily., Disp: , Rfl:  .  omeprazole (PRILOSEC) 40 MG capsule, Take 40 mg by mouth daily. , Disp: , Rfl: 12 .  Probiotic Product (PROBIOTIC DAILY PO), Take 1 tablet by mouth daily. , Disp: , Rfl:   Allergies  Allergen Reactions  .  Amoxicillin Diarrhea and Nausea And Vomiting    GI intolerance.   . Metoclopramide Hcl Other (See Comments)    Stroke-like symptoms (dose too high?)    Objective:   VITALS: Per patient if applicable -320/23 GENERAL: Alert, appears well and in no acute distress. HEENT: Atraumatic, conjunctiva clear, no obvious abnormalities on inspection of external nose and ears. NECK: Normal movements of the head and neck. CARDIOPULMONARY: No increased WOB. Speaking in clear sentences. I:E ratio WNL.  MS: Moves all visible extremities without noticeable abnormality. PSYCH: Pleasant and cooperative, well-groomed. Speech normal rate and rhythm. Affect is appropriate. Insight and judgement are appropriate. Attention is focused, linear, and appropriate.  NEURO: CN grossly intact. Oriented as arrived to appointment on time with no prompting. Moves both UE equally.  SKIN: No obvious lesions, wounds, erythema, or cyanosis noted on face or hands.  Assessment and Plan:   Diagnoses and all orders for this visit:  Coronary artery disease with hx of myocardial infarct w/o hx of CABG  Hypotension, unspecified hypotension type  Cough    . Reviewed expectations re: course of current medical issues. . Discussed self-management of symptoms. . Outlined signs and symptoms indicating need for more acute intervention. . Patient verbalized understanding and all questions were answered. Mar[facility] Health Maintenance issues  including appropriate healthy diet, exercise, and smoking avoidance were discussed with patient. . See orders for this visit as documented in the electronic medical record.  Arnette Norris, MD 11/29/2018  Records requested if needed. Time spent with the patient: 15 minutes, of which >50% was spent in obtaining information about her symptoms, reviewing her previous labs, evaluations, and treatments, counseling her about her condition (please see the discussed topics above), and developing a plan to further investigate it; she had a number of questions which I addressed.

## 2018-11-29 NOTE — Assessment & Plan Note (Signed)
Advised to increase salt in diet slightly as she is not taking any antihypertensives. She will continue to check her BP at home and update me via mychart. The patient indicates understanding of these issues and agrees with the plan.

## 2018-11-29 NOTE — Assessment & Plan Note (Signed)
Afebrile and is not working to breath on video exam today. No concerning signs of febrile viral illness/influenza/covid or PNA at this time. Symptoms started yesterday- no SOB or fever. Push fluids. Daughter will check her temperature and stay with her mom and continue to keep me updated. The patient indicates understanding of these issues and agrees with the plan.

## 2018-11-30 ENCOUNTER — Ambulatory Visit: Payer: Medicare Other | Admitting: Adult Health

## 2018-12-06 ENCOUNTER — Other Ambulatory Visit: Payer: Self-pay

## 2018-12-06 ENCOUNTER — Ambulatory Visit: Payer: Medicare Other | Admitting: Family Medicine

## 2018-12-06 MED ORDER — DOXYCYCLINE HYCLATE 100 MG PO TABS: 100.0000 mg | ORAL_TABLET | Freq: Two times a day (BID) | ORAL | 0 refills | Status: AC

## 2018-12-08 ENCOUNTER — Encounter: Payer: Self-pay | Admitting: Family Medicine

## 2018-12-08 ENCOUNTER — Ambulatory Visit (INDEPENDENT_AMBULATORY_CARE_PROVIDER_SITE_OTHER): Payer: Medicare Other | Admitting: Family Medicine

## 2018-12-08 DIAGNOSIS — R05 Cough: Secondary | ICD-10-CM

## 2018-12-08 DIAGNOSIS — R059 Cough, unspecified: Secondary | ICD-10-CM

## 2018-12-08 MED ORDER — HYDROCODONE-HOMATROPINE 5-1.5 MG/5ML PO SYRP: 5.0000 mL | ORAL_SOLUTION | Freq: Every evening | ORAL | 0 refills | Status: DC | PRN

## 2018-12-08 MED ORDER — GUAIFENESIN ER 600 MG PO TB12: 600.0000 mg | ORAL_TABLET | Freq: Two times a day (BID) | ORAL | 0 refills | Status: DC

## 2018-12-08 NOTE — Assessment & Plan Note (Signed)
>  25 minutes spent in face to face time with patient, >50% spent in counselling or coordination of care Spoke with pt and her daughter. Still taking doxycycline.  Advised to finish course of doxycyline. She remains afebrile and is checking her BP daily. Will start mucinex and advised her to push water.  Hycodan (very small amount) at bedtime so she can sleep and take deep breaths now that she is still having rib pain. Discussed sedation precautions and fall risk- she will take it only for severe cough at night.  Has a bedside commode and daughter lives with her. Call or send my chart message prn if these symptoms worsen or fail to improve as anticipated. The patient indicates understanding of these issues and agrees with the plan.

## 2018-12-08 NOTE — Progress Notes (Signed)
Virtual Visit via Video   I connected with Molly Chandler on 12/08/18 at 11:20 AM EDT by a video enabled telemedicine application and verified that I am speaking with the correct person using two identifiers. Location patient: Home Location provider: [facility], Office Persons participating in the virtual visit: Undra Trembath, Arnette Norris, MD   I discussed the limitations of evaluation and management by telemedicine and the availability of in person appointments. The patient expressed understanding and agreed to proceed.  Subjective:   HPI: Last saw pt via webex visit on 11/29/18.  Note reviewed. At that time, had one day of non productive cough, afebrile. No concerning signs of febrile viral illness/influenza/covid or PNA.  Advised daughter to Push fluids and to check her temperature and stay with her mom and continue to keep me updated.  Given her cardiac history and COVID crisis, I did send in rx for doxycycline.  Today, she reports that She takes her last dosage of Doxy this afternoon.   She has been coughing up thick stuff and is spitting it out and has used a whole bottle of Delsym.   Sputum is yellow.   Sometimes she feels like she is going to choke because it is so much. She is not drinking much water.  Can't sleep because she is coughing so hard.  Still afebrile.   ROS: See pertinent positives and negatives per HPI.  Review of Systems  Constitutional: Positive for fatigue. Negative for fever.  HENT: Negative.   Eyes: Negative.   Respiratory: Positive for cough. Negative for choking, shortness of breath, wheezing and stridor.   Cardiovascular: Negative.   Gastrointestinal: Negative.   Endocrine: Negative.   Genitourinary: Negative.   Musculoskeletal: Negative.   Skin: Negative.   Allergic/Immunologic: Negative.   Hematological: Negative.   Psychiatric/Behavioral: Positive for sleep disturbance.  All other systems reviewed and are negative.     Patient Active Problem List   Diagnosis Date Noted  . Hypotension 11/29/2018  . Cough 11/29/2018  . Fatigue 11/15/2018  . Decreased appetite 11/15/2018  . Coronary artery disease with hx of myocardial infarct w/o hx of CABG 11/12/2018  . STEMI (ST elevation myocardial infarction) ([place]) 10/05/2018  . Acute ST elevation myocardial infarction (STEMI) involving left main coronary artery ([place]) 10/05/2018  . S/P CABG x 2 10/05/2018  . Osteoarthritis 02/05/2018  . Osteoporosis 02/05/2018  . GERD (gastroesophageal reflux disease) 09/26/2011  . Lymphoma ([place]) 05/30/2010    Social History   Tobacco Use  . Smoking status: Former Research scientist (life sciences)  . Smokeless tobacco: Never Used  Substance Use Topics  . Alcohol use: No    Current Outpatient Medications:  .  acetaminophen (TYLENOL) 325 MG tablet, Take 650 mg by mouth every 6 (six) hours as needed., Disp: , Rfl:  .  Ascorbic Acid (VITAMIN C PO), Take 1 tablet by mouth daily. , Disp: , Rfl:  .  aspirin EC 81 MG EC tablet, Take 1 tablet (81 mg total) by mouth daily., Disp: , Rfl:  .  atorvastatin (LIPITOR) 80 MG tablet, Take 1 tablet (80 mg total) by mouth daily at 6 PM., Disp: 30 tablet, Rfl: 3 .  Cholecalciferol (VITAMIN D PO), Take 1 tablet by mouth daily. , Disp: , Rfl:  .  clopidogrel (PLAVIX) 75 MG tablet, Take 1 tablet (75 mg total) by mouth daily., Disp: 30 tablet, Rfl: 3 .  doxycycline (VIBRA-TABS) 100 MG tablet, Take 1 tablet (100 mg total) by mouth 2 (two) times daily for  7 days., Disp: 14 tablet, Rfl: 0 .  Multiple Minerals (CALCIUM-MAGNESIUM-ZINC) TABS, Take 1 tablet by mouth at bedtime., Disp: , Rfl:  .  Multiple Vitamin (MULTIVITAMIN) tablet, Take 1 tablet by mouth daily., Disp: , Rfl:  .  omeprazole (PRILOSEC) 40 MG capsule, Take 40 mg by mouth daily. , Disp: , Rfl: 12 .  Probiotic Product (PROBIOTIC DAILY PO), Take 1 tablet by mouth daily. , Disp: , Rfl:   Allergies  Allergen Reactions  . Amoxicillin Diarrhea and Nausea And Vomiting     GI intolerance.   . Metoclopramide Hcl Other (See Comments)    Stroke-like symptoms (dose too high?)    Objective:  BP 103/72 (Patient Position: Sitting, Cuff Size: Normal)   Pulse 100   Ht 4' 11.5" (1.511 m)   Wt 156 lb 3.2 oz (70.9 kg)   BMI 31.02 kg/m   Wt Readings from Last 3 Encounters:  12/08/18 156 lb 3.2 oz (70.9 kg)  11/16/18 163 lb (73.9 kg)  11/15/18 162 lb (73.5 kg)    VITALS: Per patient if applicable, see vitals. GENERAL: Alert, appears well and in no acute distress. HEENT: Atraumatic, conjunctiva clear, no obvious abnormalities on inspection of external nose and ears. NECK: Normal movements of the head and neck. CARDIOPULMONARY: No increased WOB. Speaking in clear sentences. Harsh, wet cough. No sputum produced during visit. MS: Moves all visible extremities without noticeable abnormality. PSYCH: Pleasant and cooperative, well-groomed. Speech normal rate and rhythm. Affect is appropriate. Insight and judgement are appropriate. Attention is focused, linear, and appropriate.  NEURO: CN grossly intact. Oriented as arrived to appointment on time with no prompting. Moves both UE equally.  SKIN: No obvious lesions, wounds, erythema, or cyanosis noted on face or hands.  Assessment and Plan:   There are no diagnoses linked to this encounter.  . Reviewed expectations re: course of current medical issues. . Discussed self-management of symptoms. . Outlined signs and symptoms indicating need for more acute intervention. . Patient verbalized understanding and all questions were answered. Marland Kitchen Health Maintenance issues including appropriate healthy diet, exercise, and smoking avoidance were discussed with patient. . See orders for this visit as documented in the electronic medical record.  Arnette Norris, MD 12/08/2018

## 2018-12-09 ENCOUNTER — Telehealth: Payer: Self-pay

## 2018-12-09 ENCOUNTER — Telehealth: Payer: Self-pay | Admitting: Adult Health

## 2018-12-09 NOTE — Telephone Encounter (Signed)
Will forward to [place] arranging Arnold Long DNP visits

## 2018-12-09 NOTE — Telephone Encounter (Signed)
New Message    Pts daughter is calling because she needed an earlier time scheduled for the Virtual appt. She asked if someone will call or email her the codes for the virtual visit  Please call

## 2018-12-09 NOTE — Telephone Encounter (Signed)
[email or web address]

## 2018-12-09 NOTE — Telephone Encounter (Signed)
[email or web address] Called pt she states that she would like a telephone visit to discuss her cardiac health. Scheduled an appointment Monday 4-6 at 130pm she states that she does have a BP cuff and a scale no Mychart but does have email.

## 2018-12-13 ENCOUNTER — Telehealth (INDEPENDENT_AMBULATORY_CARE_PROVIDER_SITE_OTHER): Payer: Medicare Other | Admitting: Adult Health

## 2018-12-13 ENCOUNTER — Telehealth: Payer: Medicare Other | Admitting: Adult Health

## 2018-12-13 VITALS — BP 117/78 | HR 85 | Temp 97.9°F | Ht 59.5 in | Wt 154.4 lb

## 2018-12-13 DIAGNOSIS — I959 Hypotension, unspecified: Secondary | ICD-10-CM

## 2018-12-13 DIAGNOSIS — E7800 Pure hypercholesterolemia, unspecified: Secondary | ICD-10-CM

## 2018-12-13 DIAGNOSIS — I2510 Atherosclerotic heart disease of native coronary artery without angina pectoris: Secondary | ICD-10-CM

## 2018-12-13 NOTE — Progress Notes (Signed)
Virtual Visit via Video Note   This visit type was conducted due to national recommendations for restrictions regarding the COVID-19 Pandemic (e.g. social distancing) in an effort to limit this patient's exposure and mitigate transmission in our community.  Due to her co-morbid illnesses, this patient is at least at moderate risk for complications without adequate follow up.  This format is felt to be most appropriate for this patient at this time.  All issues noted in this document were discussed and addressed.  A limited physical exam was performed with this format.  Please refer to the patient's chart for her consent to telehealth for [facility].   Patient has given permission to conduct this visit via virtual appointment and to bill insurance.   Evaluation Performed:  Follow-up visit  Date:  12/13/2018   ID:  Molly Chandler, Molly Chandler 20-Sep-1934, MRN [id number]  Patient Location:  [address] [place] [address]   Provider location:   [place], [place] office   PCP:  Dianne Dun, MD  Cardiologist:  Bryan Lemma, MD  Electrophysiologist:  None   Chief Complaint:    History of Present Illness:    Molly Chandler is a 84 y.o. female who presents via audio/video conferencing for a telehealth visit today.  She has a history of CAD, s/p CABG after STEMI on 10/05/2018, with emergent cardiac cath revealing severe distal LM to the ostial LAD of 95% stenosis, with ostial Cx lesion of 50%, with moderate severely reduced systolic dysfunction with EF of 25%-35%. She required IABP due to cardiogenic shock and treatment for acute on chronic CHF. CABG was performed on 10/05/2018 with LIMA to LAD, SVG to OM.  She was last seen in the office on 11/01/2018 still feeling weak, not eating well. U/A was checked.  She was also seen by her PCP via telemedicine on 11/29/2018. Her daughter reported continued weakness and anorexia. She also had a sore throat and mildly productive cough.  She was afebrile. No treatments were planned. She was to continue fluid hydration. She was seen again by PCP on 12/08/2018 and doxycycline and Muccinex was ordered. She remained afebrile. She was given hydrocodone for coughing at nighttime.   She is feeling much better as she has recovered from her cold. She is still not active yet. Tired from the cold. She denies fever. BP is a little low and she sometimes feels dizzy if she is on her feet too long.   The patient does not have symptoms concerning for COVID-19 infection (fever, chills, cough, or new SHORTNESS OF BREATH).    Prior CV studies:   The following studies were reviewed today:  Cardiac Cath 10/05/2018   CULPRIT LESION: Dist LM to Ost LAD lesion is 95% stenosed. Ost Cx lesion is 50% stenosed.  There is moderate to severe left ventricular systolic dysfunction. The left ventricular ejection fraction is 25-35% by visual estimate -dense anterior wall motion and normality  LV end diastolic pressure is severely elevated.  There is no aortic valve stenosis. There is mild (2+) mitral regurgitation.  SUMMARY   Severe distal left main-ostial LAD 95% stenosis involving ostium of LCx 50%.  Moderate severely reduced LVEF with diffuse anterior hypokinesis and severely elevated LVEDP  Borderline cardiogenic shock, likely related to nitroglycerin administration, resolved after brief course of IV Levophed.  ACUTE COMBINED SYSTOLIC AND DIASTOLIC HEART FAILURE from acute ischemic cardiomyopathy  Treatments:surgery  1. Median Sternotomy 2. Extracorporeal circulation 3.Emergent coronary artery bypass grafting x2  Left internal mammary graft to the LAD  SVG to OM  4. Endoscopic vein harvest from the rightleg 5. Insertion of left femoral arterial line.  Past Medical History:  Diagnosis Date  . Cataract   . Lymphoma (HCC)    rectal cancer  . Myocardial infarction (H[facility])   . Osteoporosis    osteopenia   Past Surgical  History:  Procedure Laterality Date  . CORONARY ARTERY BYPASS GRAFT N/A 10/05/2018   Procedure: CORONARY ARTERY BYPASS GRAFTING (CABG)x 2,using left internal mammary artery and right leg greater saphenous vein harvested endoscopically;  Surgeon: Alleen Borne, MD;  Location: [facility] OR;  Service: Cardiothoracic;  Laterality: N/A;  . CORONARY/GRAFT ACUTE MI REVASCULARIZATION N/A 10/05/2018   Procedure: Coronary/Graft Acute MI Revascularization;  Surgeon: Marykay Lex, MD;  Location: [facility] INVASIVE CV LAB;  Service: Cardiovascular;  Laterality: N/A;  . IABP INSERTION N/A 10/05/2018   Procedure: IABP Insertion;  Surgeon: Marykay Lex, MD;  Location: [facility] INVASIVE CV LAB;  Service: Cardiovascular;  Laterality: N/A;  . LEFT HEART CATH AND CORONARY ANGIOGRAPHY N/A 10/05/2018   Procedure: LEFT HEART CATH AND CORONARY ANGIOGRAPHY;  Surgeon: Marykay Lex, MD;  Location[facility] INVASIVE CV LAB;  Service: Cardiovascular;  Laterality: N/A;     Current Meds  Medication Sig  . acetaminophen (TYLENOL) 325 MG tablet Take 650 mg by mouth every 6 (six) hours as needed.  . Ascorbic Acid (VITAMIN C PO) Take 1 tablet by mouth daily.   Marland Kitchen aspirin EC 81 MG EC tablet Take 1 tablet (81 mg total) by mouth daily.  Marland Kitchen atorvastatin (LIPITOR) 80 MG tablet Take 1 tablet (80 mg total) by mouth daily at 6 PM.  . Cholecalciferol (VITAMIN D PO) Take 1 tablet by mouth daily.   . clopidogrel (PLAVIX) 75 MG tablet Take 1 tablet (75 mg total) by mouth daily.  . Multiple Minerals (CALCIUM-MAGNESIUM-ZINC) TABS Take 1 tablet by mouth at bedtime.  . Multiple Vitamin (MULTIVITAMIN) tablet Take 1 tablet by mouth daily.  Marland Kitchen omeprazole (PRILOSEC) 40 MG capsule Take 40 mg by mouth daily.   . Probiotic Product (PROBIOTIC DAILY PO) Take 1 tablet by mouth daily.      Allergies:   Amoxicillin and Metoclopramide hcl   Social History   Tobacco Use  . Smoking status: Former Games developer  . Smokeless tobacco: Never Used  Substance Use Topics  .  Alcohol use: No  . Drug use: Not on file     Family Hx: The patient's family history is not on file.  ROS:   Please see the history of present illness.   All other systems reviewed and are negative.   Labs/Other Tests and Data Reviewed:    Recent Labs: 10/06/2018: Magnesium 2.1 11/15/2018: ALT 13; BUN 11; Creatinine, Ser 0.67; Hemoglobin 13.2; Platelets 333.0; Potassium 3.9; Pro B Natriuretic peptide (BNP) 74.0; Sodium 138; TSH 1.93   Recent Lipid Panel Lab Results  Component Value Date/Time   CHOL 178 10/05/2018 07:08 AM   TRIG 20 10/05/2018 07:08 AM   HDL 47 10/05/2018 07:08 AM   CHOLHDL 3.8 10/05/2018 07:08 AM   LDLCALC 127 (H) 10/05/2018 07:08 AM   LDLDIRECT 156.4 05/30/2010 03:01 PM    Wt Readings from Last 3 Encounters:  12/13/18 154 lb 6.4 oz (70 kg)  12/08/18 156 lb 3.2 oz (70.9 kg)  11/16/18 163 lb (73.9 kg)     Exam:    Vital Signs:  BP 117/78 (BP Location: Left Arm)   Pulse 85  Temp 97.9 F (36.6 C)   Ht 4' 11.5" (1.511 m)   Wt 154 lb 6.4 oz (70 kg)   BMI 30.66 kg/m    Well nourished, well developed female in no acute distress. Assessment is limited as this is a telephone visit. She cough on occasion but not with severe congestion sounds with rhonchi, mostly clearing her throat.   ASSESSMENT & PLAN:    1. CAD: Hx of CABG 128/2020. She is recovering slowly,due to an URI which is being treated by PCP. She is doing better with this. She denies worsening symptoms. Her appetite is still not what it used to be. She reports that she is down about 10 lbs. I have asked her to try and be more active as she can. Walk about 10 minutes everyday. Stop to rest, but keep going to increase her stamina. She is given refills on Plavix   2. Hypercholesterolemia: Goal of LDL < 70. She is given refills on atorvastatin. Will need follow up labs. She is reluctant to come in for labs due to COVID pandemic. Will have these repeated in 3 months after her next appointment.   3.  Hypotension: She is not on any antihypertensive medications at this time. BP is low normal today. Keep hydrated. No changes at this time.    COVID-19 Education: The signs and symptoms of COVID-19 were discussed with the patient and how to seek care for testing (follow up with PCP or arrange E-visit).  The importance of social distancing was discussed today.  Patient Risk:   After full review of this patients clinical status, I feel that they are at least moderate risk at this time.  Time:   Today, I have spent 12  minutes with the patient with telehealth technology discussing diagnosis, vital signs and activities.     Medication Adjustments/Labs and Tests Ordered: Current medicines are reviewed at length with the patient today.  Concerns regarding medicines are outlined above.  Tests Ordered: No orders of the defined types were placed in this encounter.  Medication Changes: No orders of the defined types were placed in this encounter.   Disposition: 3 months with labs.   Signed, Bettey Mare. Liborio Nixon, ANP, [facility]  12/13/2018 9:49 AM    [facility] [address] [place], [place], [place]  [address] Phone: [phone]; Fax: [phone]

## 2018-12-13 NOTE — Patient Instructions (Addendum)
Follow-Up: You will need a follow up appointment in 4 months.  Please call our office 2 months in advance to schedule this appointment.  You may see Glenetta Hew, MD  , Jory Sims, DNP, [place] or one of the following Advanced Practice Providers on your designated Care Team:   Rosaria Ferries, PA-C , Jory Sims, DNP, AACC  Medication Instructions:  NO CHANGES- Your physician recommends that you continue on your current medications as directed. Please refer to the Current Medication list given to you today. If you need a refill on your cardiac medications before your next appointment, please call your pharmacy. Labwork: When you have labs (blood work) and your tests are completely normal, you will receive your results ONLY by [place] (if you have MyChart) -OR- A paper copy in the mail.  At [facility], you and your health needs are our priority.  As part of our continuing mission to provide you with exceptional heart care, we have created designated Provider Care Teams.  These Care Teams include your primary Cardiologist (physician) and Advanced Practice Providers (APPs -  Physician Assistants and Nurse Practitioners) who all work together to provide you with the care you need, when you need it.  Thank you for choosing [facility] at [facility]!!

## 2018-12-16 ENCOUNTER — Other Ambulatory Visit: Payer: Self-pay

## 2018-12-20 ENCOUNTER — Ambulatory Visit: Payer: Medicare Other | Admitting: Surgery

## 2018-12-20 ENCOUNTER — Ambulatory Visit: Payer: Self-pay | Admitting: Surgery

## 2018-12-20 ENCOUNTER — Telehealth (INDEPENDENT_AMBULATORY_CARE_PROVIDER_SITE_OTHER): Payer: Self-pay | Admitting: Surgery

## 2018-12-20 DIAGNOSIS — I2510 Atherosclerotic heart disease of native coronary artery without angina pectoris: Secondary | ICD-10-CM

## 2018-12-20 NOTE — Telephone Encounter (Signed)
[address]       [name] [address]             [phone]     CARDIOTHORACIC SURGERY TELEPHONE VIRTUAL OFFICE NOTE  Referring Provider is Keshonna Valvo Lemma, MD Primary Cardiologist is Jeniya Flannigan Lemma, MD PCP is Dianne Dun, MD   HPI:  I spoke with Molly Chandler (DOB 1935-03-30 ) and her daughter Molly Chandler via telephone on 12/20/2018 at 10:45 AM and verified that I was speaking with the correct person using more than one form of identification.  We discussed the reason(s) for conducting our visit virtually instead of in-person.  The patient expressed understanding the circumstances and agreed to proceed as described.   The patient is status post emergent coronary bypass graft surgery x2 on 10/05/2018.  She presented with a STEMI due to a 95% distal left main and ostial LAD stenosis.  Her left ventricular ejection fraction at time of surgery was 40 to 45% and was unchanged postoperatively although there was improved anterior wall motion.  She had an uneventful recovery until her pacing wires were removed.  She developed hypotension and felt poorly.  A stat echocardiogram was done and ruled out pericardial effusion.  Her ejection fraction was 50 to 55%.  This resolved with some fluid.  After discharge she said that she went outside to sit on her porch and then developed upper respiratory symptoms with a cough but no fever.  She was in communication with her primary physician by telemedicine and was started on Mucinex and doxycycline.  She says that after she completed these medications she felt much better and currently has no cough or shortness of breath.  She has been walking short distances in the house but is somewhat limited due to her chronic back problems.  She has been hesitant to get back outside due to the pollen which she thinks started her upper respiratory infection in the first place.  She said that she remains afebrile.  Her incisions in the chest and right  leg are healing well.  She has no peripheral edema.   Current Outpatient Medications  Medication Sig Dispense Refill  . acetaminophen (TYLENOL) 325 MG tablet Take 650 mg by mouth every 6 (six) hours as needed.    . Ascorbic Acid (VITAMIN C PO) Take 1 tablet by mouth daily.     Marland Kitchen aspirin EC 81 MG EC tablet Take 1 tablet (81 mg total) by mouth daily.    Marland Kitchen atorvastatin (LIPITOR) 80 MG tablet Take 1 tablet (80 mg total) by mouth daily at 6 PM. 30 tablet 3  . Cholecalciferol (VITAMIN D PO) Take 1 tablet by mouth daily.     . clopidogrel (PLAVIX) 75 MG tablet Take 1 tablet (75 mg total) by mouth daily. 30 tablet 3  . guaiFENesin (MUCINEX) 600 MG 12 hr tablet Take 1 tablet (600 mg total) by mouth 2 (two) times daily. 20 tablet 0  . HYDROcodone-homatropine (HYCODAN) 5-1.5 MG/5ML syrup Take 5 mLs by mouth at bedtime as needed for cough. (Patient not taking: Reported on 12/13/2018) 120 mL 0  . Multiple Minerals (CALCIUM-MAGNESIUM-ZINC) TABS Take 1 tablet by mouth at bedtime.    . Multiple Vitamin (MULTIVITAMIN) tablet Take 1 tablet by mouth daily.    Marland Kitchen omeprazole (PRILOSEC) 40 MG capsule Take 40 mg by mouth daily.   12  . Probiotic Product (PROBIOTIC DAILY PO) Take 1 tablet by mouth daily.      No  current facility-administered medications for this visit.      Diagnostic Tests:  N/A   Impression:  She is almost 3 months out from emergent coronary bypass graft surgery.  She has been feeling much better over the past week with resolution of her upper respiratory symptoms.  Her incisions appear to be healing well.  Her only complaint at this time is of her chronic back and hip pain.  She said that she usually has injections every 4 months for this but is going to have to wait due to the coronavirus pandemic.  I encouraged her to continue walking as much as she can in the house and to use her incentive spirometer.  Plan:  She is going to follow-up with Dr. Dayton Martes and cardiology and will contact me if  she develops any problems with her incisions.    I discussed limitations of evaluation and management via telephone.  The patient was advised to call back for repeat telephone consultation or to seek an in-person evaluation if questions arise or the patient's clinical condition changes in any significant manner.  I spent in excess of 10 minutes of non-face-to-face time during the conduct of this telephone virtual office consultation.    Alleen Borne, MD 12/20/2018 11:52 AM

## 2018-12-22 ENCOUNTER — Ambulatory Visit: Payer: Medicare Other | Admitting: Surgery

## 2018-12-30 ENCOUNTER — Telehealth (HOSPITAL_COMMUNITY): Payer: Self-pay | Admitting: *Deleted

## 2018-12-30 NOTE — Telephone Encounter (Signed)
Called and left message for pt in regards to  Cardiac Rehab continued closure due to adherence of national recommendation for Covid 19 in group setting.  Will go ahead and mail to pt exercise handouts and bands she can use within the home due to her sensitivity to pollen. Plan to follow up in a few weeks for progress. Cherre Huger, BSN Cardiac and Training and development officer

## 2019-02-04 ENCOUNTER — Other Ambulatory Visit: Payer: Self-pay | Admitting: Physician Assistant

## 2019-02-07 ENCOUNTER — Other Ambulatory Visit: Payer: Self-pay | Admitting: Physician Assistant

## 2019-02-08 ENCOUNTER — Telehealth: Payer: Self-pay

## 2019-02-08 NOTE — Telephone Encounter (Signed)
Yes, she can have refills on the Plavix and atorvastatin 80 mg.

## 2019-02-08 NOTE — Telephone Encounter (Signed)
Hi there, pt left vm to find if you want her to continue taking Clopidogrel 75mg  and Atorvastatin 80mg . If so, she needs refills on both, she's completely out. Send rx to [facility]  Thanks

## 2019-02-10 ENCOUNTER — Other Ambulatory Visit: Payer: Self-pay

## 2019-02-10 ENCOUNTER — Other Ambulatory Visit: Payer: Self-pay | Admitting: Physician Assistant

## 2019-02-10 MED ORDER — CLOPIDOGREL BISULFATE 75 MG PO TABS: 75.0000 mg | ORAL_TABLET | Freq: Every day | ORAL | 3 refills | Status: DC

## 2019-02-14 ENCOUNTER — Other Ambulatory Visit: Payer: Self-pay

## 2019-02-14 MED ORDER — ATORVASTATIN CALCIUM 80 MG PO TABS: 80.0000 mg | ORAL_TABLET | Freq: Every day | ORAL | 3 refills | Status: DC

## 2019-02-14 NOTE — Telephone Encounter (Signed)
Rx(s) sent to pharmacy electronically.  

## 2019-02-15 ENCOUNTER — Telehealth: Payer: Self-pay

## 2019-02-15 NOTE — Telephone Encounter (Signed)
TA-I spoke to pt and daughter/pt is asymptomatic right now/states that it is only when she is doing things like cooking or changing the bed clothes/they agreed to take her to UC or ED ONLY if she started having the Sx again/daughter agreed to get orthostats for virtual visit tomorrow/thx dmf

## 2019-02-16 ENCOUNTER — Ambulatory Visit (INDEPENDENT_AMBULATORY_CARE_PROVIDER_SITE_OTHER): Payer: Medicare Other | Admitting: Family Medicine

## 2019-02-16 ENCOUNTER — Encounter: Payer: Self-pay | Admitting: Family Medicine

## 2019-02-16 VITALS — Temp 97.7°F | Wt 155.8 lb

## 2019-02-16 DIAGNOSIS — I252 Old myocardial infarction: Secondary | ICD-10-CM | POA: Diagnosis not present

## 2019-02-16 DIAGNOSIS — R531 Weakness: Secondary | ICD-10-CM | POA: Diagnosis not present

## 2019-02-16 DIAGNOSIS — I2510 Atherosclerotic heart disease of native coronary artery without angina pectoris: Secondary | ICD-10-CM

## 2019-02-16 MED ORDER — ATORVASTATIN CALCIUM 80 MG PO TABS: 80.0000 mg | ORAL_TABLET | Freq: Every day | ORAL | 3 refills | Status: DC

## 2019-02-16 NOTE — Assessment & Plan Note (Signed)
>  25 minutes spent in face to face time with patient, >50% spent in counselling or coordination of care discussing these episodes that she has with pt and her daughter.  I agree with her cardiologist that this does not sound cardiac as she can walk for miles without these symptoms.  It seems to be more of a heat intolerance/getting overheated over the stove or hot dryer and not drinking enough water.  Daughter agreed.  Labs reviewed from three months ago which were reassuring.  Advised using a cool cloth around her neck or other cooling options when she is doing hot activities to see if it helps. Call or send my chart message prn if these symptoms worsen or fail to improve as anticipated. The patient indicates understanding of these issues and agrees with the plan.

## 2019-02-16 NOTE — Progress Notes (Signed)
Virtual Visit via Video   Due to the COVID-19 pandemic, this visit was completed with telemedicine (audio/video) technology to reduce patient and provider exposure as well as to preserve personal protective equipment.   I connected with Molly Chandler by a video enabled telemedicine application and verified that I am speaking with the correct person using two identifiers. Location patient: Home Location provider: [facility], Office Persons participating in the virtual visit: Molly Chandler, Molly Norris, MD   I discussed the limitations of evaluation and management by telemedicine and the availability of in person appointments. The patient expressed understanding and agreed to proceed.  Care Team   Patient Care Team: Molly Passy, MD as PCP - General (Family Medicine) Molly Man, MD as PCP - Cardiology (Cardiology) Regal, Molly Chandler, DPM as Consulting Physician (Podiatry) Molly Chandler, Molly Cobb, MD as Consulting Physician (Oncology) Molly Chandler, OD as Consulting Physician (Optometry)  Subjective:   HPI:   "weak and sweaty episodes"- for months, she has episodes that occur- usually when standing over the stove or the dryer when she gets sweaty and becomes weak.  She told triage nurse she was SOB but today she tells me she is not having any SOB or DOE.  She states that she goes for a walk every morning and every evening when it is cool out and never has these symptoms then.  When she is cooking or getting clothes out of the dryer and these episodes occur, she has no nausea, vomiting, chest pain, dizziness, presycnope or syncope.  Admits to not drinking much water.  When she sits down she feels better.  Cardiologist advised her to contact me, per pt.   Review of Systems  Constitutional: Positive for diaphoresis and malaise/fatigue.  HENT: Negative.   Eyes: Negative.   Respiratory: Positive for cough. Negative for hemoptysis, sputum production, shortness of breath  and wheezing.   Cardiovascular: Negative for chest pain, palpitations, orthopnea, claudication, leg swelling and PND.  Gastrointestinal: Negative.   Musculoskeletal: Negative.   Skin: Negative.   Neurological: Positive for weakness.  Endo/Heme/Allergies: Negative.   Psychiatric/Behavioral: Negative.   All other systems reviewed and are negative.    Patient Active Problem List   Diagnosis Date Noted  . Episodic weakness 02/16/2019  . Hypotension 11/29/2018  . Cough 11/29/2018  . Fatigue 11/15/2018  . Decreased appetite 11/15/2018  . Coronary artery disease with hx of myocardial infarct w/o hx of CABG 11/12/2018  . STEMI (ST elevation myocardial infarction) ([place]) 10/05/2018  . Acute ST elevation myocardial infarction (STEMI) involving left main coronary artery ([place]) 10/05/2018  . S/P CABG x 2 10/05/2018  . Osteoarthritis 02/05/2018  . Osteoporosis 02/05/2018  . GERD (gastroesophageal reflux disease) 09/26/2011  . Lymphoma ([place]) 05/30/2010    Social History   Tobacco Use  . Smoking status: Former Research scientist (life sciences)  . Smokeless tobacco: Never Used  Substance Use Topics  . Alcohol use: No    Current Outpatient Medications:  .  acetaminophen (TYLENOL) 325 MG tablet, Take 650 mg by mouth every 6 (six) hours as needed., Disp: , Rfl:  .  aspirin EC 81 MG EC tablet, Take 1 tablet (81 mg total) by mouth daily., Disp: , Rfl:  .  Cholecalciferol (VITAMIN D PO), Take 1 tablet by mouth daily. , Disp: , Rfl:  .  clopidogrel (PLAVIX) 75 MG tablet, Take 1 tablet (75 mg total) by mouth daily., Disp: 90 tablet, Rfl: 3 .  guaiFENesin (MUCINEX) 600 MG 12 hr tablet, Take  1 tablet (600 mg total) by mouth 2 (two) times daily., Disp: 20 tablet, Rfl: 0 .  omeprazole (PRILOSEC) 40 MG capsule, Take 40 mg by mouth daily. , Disp: , Rfl: 12 .  Probiotic Product (PROBIOTIC DAILY PO), Take 1 tablet by mouth daily. , Disp: , Rfl:  .  atorvastatin (LIPITOR) 80 MG tablet, Take 1 tablet (80 mg total) by mouth daily at 6  PM., Disp: 90 tablet, Rfl: 3  Allergies  Allergen Reactions  . Amoxicillin Diarrhea and Nausea And Vomiting    GI intolerance.   . Metoclopramide Hcl Other (See Comments)    Stroke-like symptoms (dose too high?)    Objective:   Temp 97.7 F (36.5 C) (Oral)   Wt 155 lb 12.8 oz (70.7 kg)   BMI 30.94 kg/m   GENERAL: Alert, appears well and in no acute distress. HEENT: Atraumatic, conjunctiva clear, no obvious abnormalities on inspection of external nose and ears. NECK: Normal movements of the head and neck. CARDIOPULMONARY: No increased WOB. Speaking in clear sentences. I:E ratio WNL.  MS: Moves all visible extremities without noticeable abnormality. PSYCH: Pleasant and cooperative, well-groomed. Speech normal rate and rhythm. Affect is appropriate. Insight and judgement are appropriate. Attention is focused, linear, and appropriate.  NEURO: CN grossly intact. Oriented as arrived to appointment on time with no prompting. Moves both UE equally.  SKIN: No obvious lesions, wounds, erythema, or cyanosis noted on face or hands.  Depression screen [facility] 2/9 11/15/2018 05/31/2018 02/05/2018  Decreased Interest 0 0 0  Down, Depressed, Hopeless 0 0 0  PHQ - 2 Score 0 0 0  Altered sleeping - - -  Tired, decreased energy - - -  Change in appetite - - -  Feeling bad or failure about yourself  - - -  Trouble concentrating - - -  Moving slowly or fidgety/restless - - -  Suicidal thoughts - - -  PHQ-9 Score - - -  Difficult doing work/chores - - -    Assessment and Plan:   [name] was seen today for shortness of breath.  Diagnoses and all orders for this visit:  Episodic weakness  Coronary artery disease with hx of myocardial infarct w/o hx of CABG  Other orders -     atorvastatin (LIPITOR) 80 MG tablet; Take 1 tablet (80 mg total) by mouth daily at 6 PM.    . COVID-19 Education: The signs and symptoms of COVID-19 were discussed with the patient and how to seek care for testing if  needed. The importance of social distancing was discussed today. . Reviewed expectations re: course of current medical issues. . Discussed self-management of symptoms. . Outlined signs and symptoms indicating need for more acute intervention. . Patient verbalized understanding and all questions were answered. Marland Kitchen Health Maintenance issues including appropriate healthy diet, exercise, and smoking avoidance were discussed with patient. . See orders for this visit as documented in the electronic medical record.  Molly Norris, MD  Records requested if needed. Time spent: 25 minutes, of which >50% was spent in obtaining information about her symptoms, reviewing her previous labs, evaluations, and treatments, counseling her about her condition (please see the discussed topics above), and developing a plan to further investigate it; she had a number of questions which I addressed.

## 2019-03-04 ENCOUNTER — Telehealth (HOSPITAL_COMMUNITY): Payer: Self-pay

## 2019-03-04 NOTE — Telephone Encounter (Signed)
No response from pt, closed referral. °

## 2019-03-28 IMAGING — CR CHEST - 2 VIEW
2 series · 2 of 2 positions shown · non-contrast
Comparison: 11/01/2018

CLINICAL DATA: Fatigue.

EXAM:
CHEST - 2 VIEW

[w chest pa]
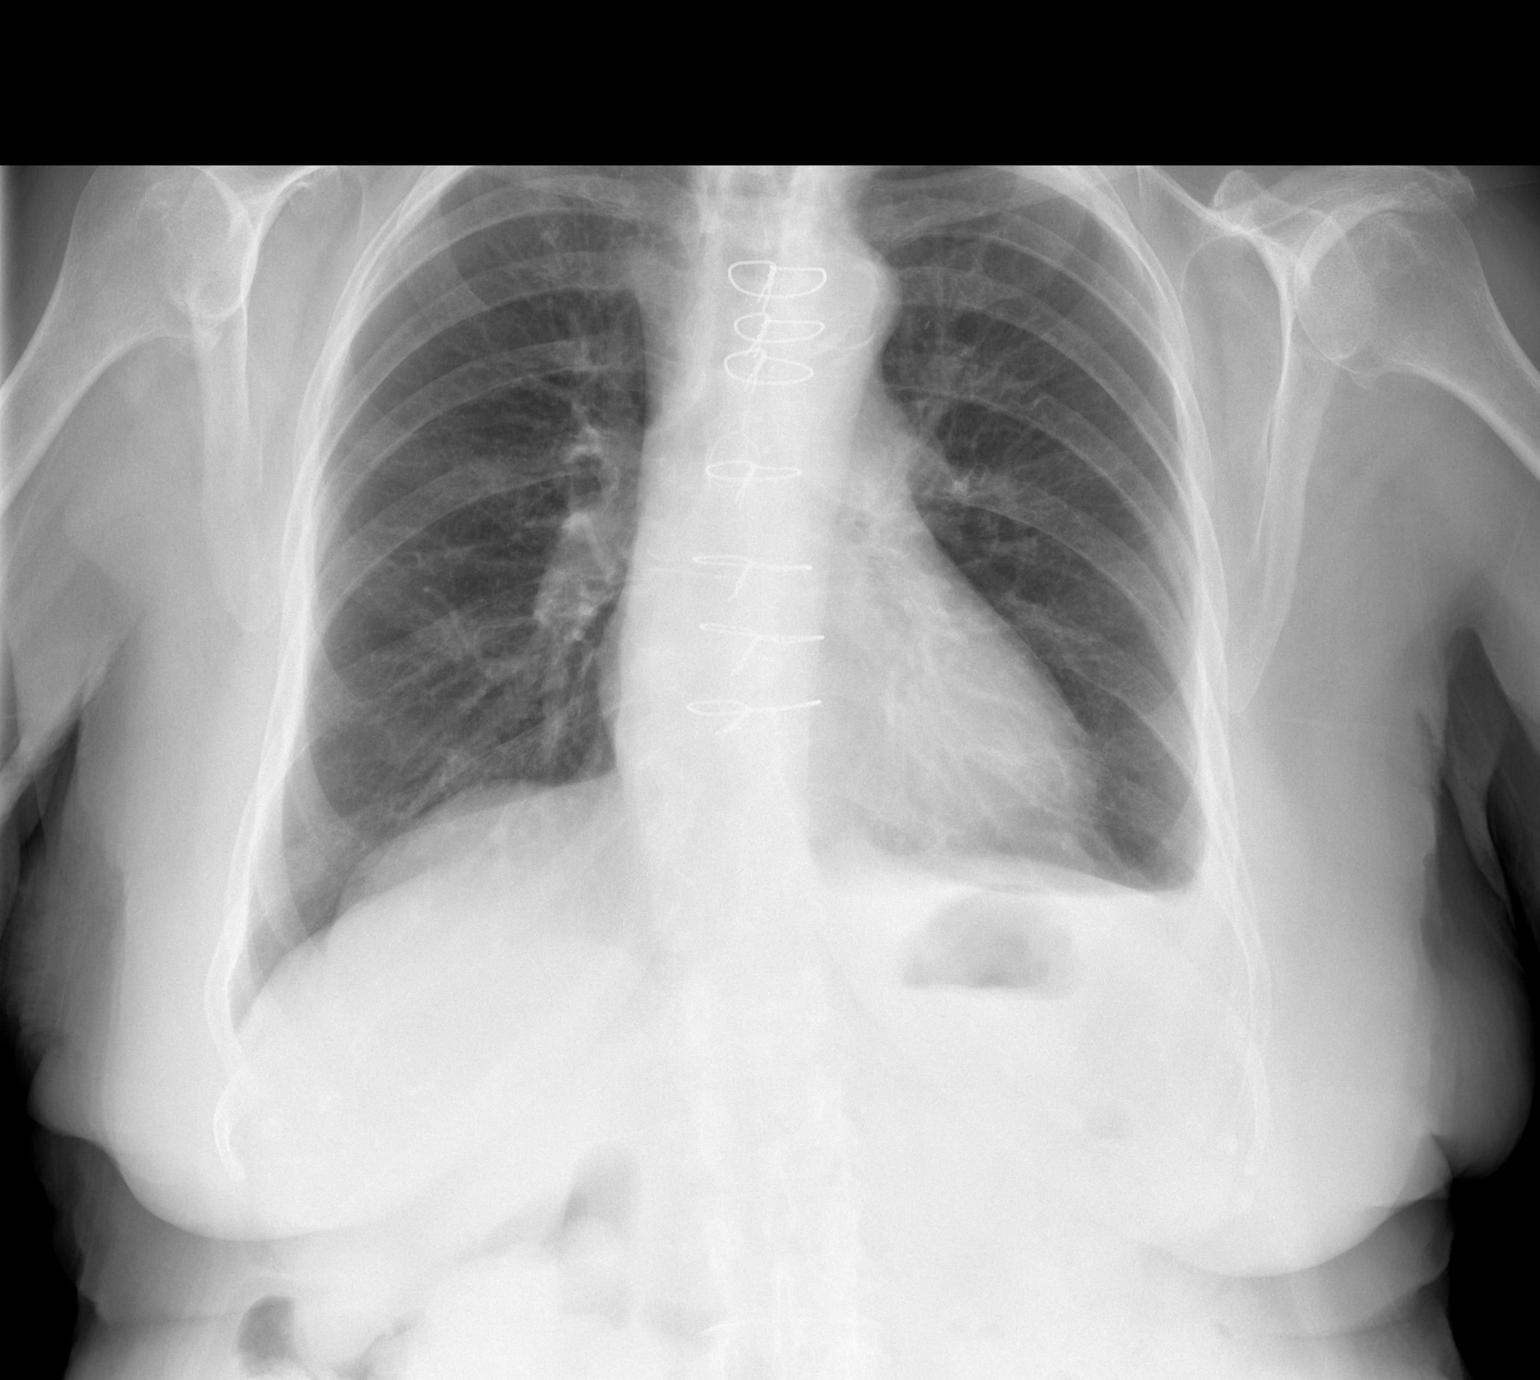

[w chest lat]
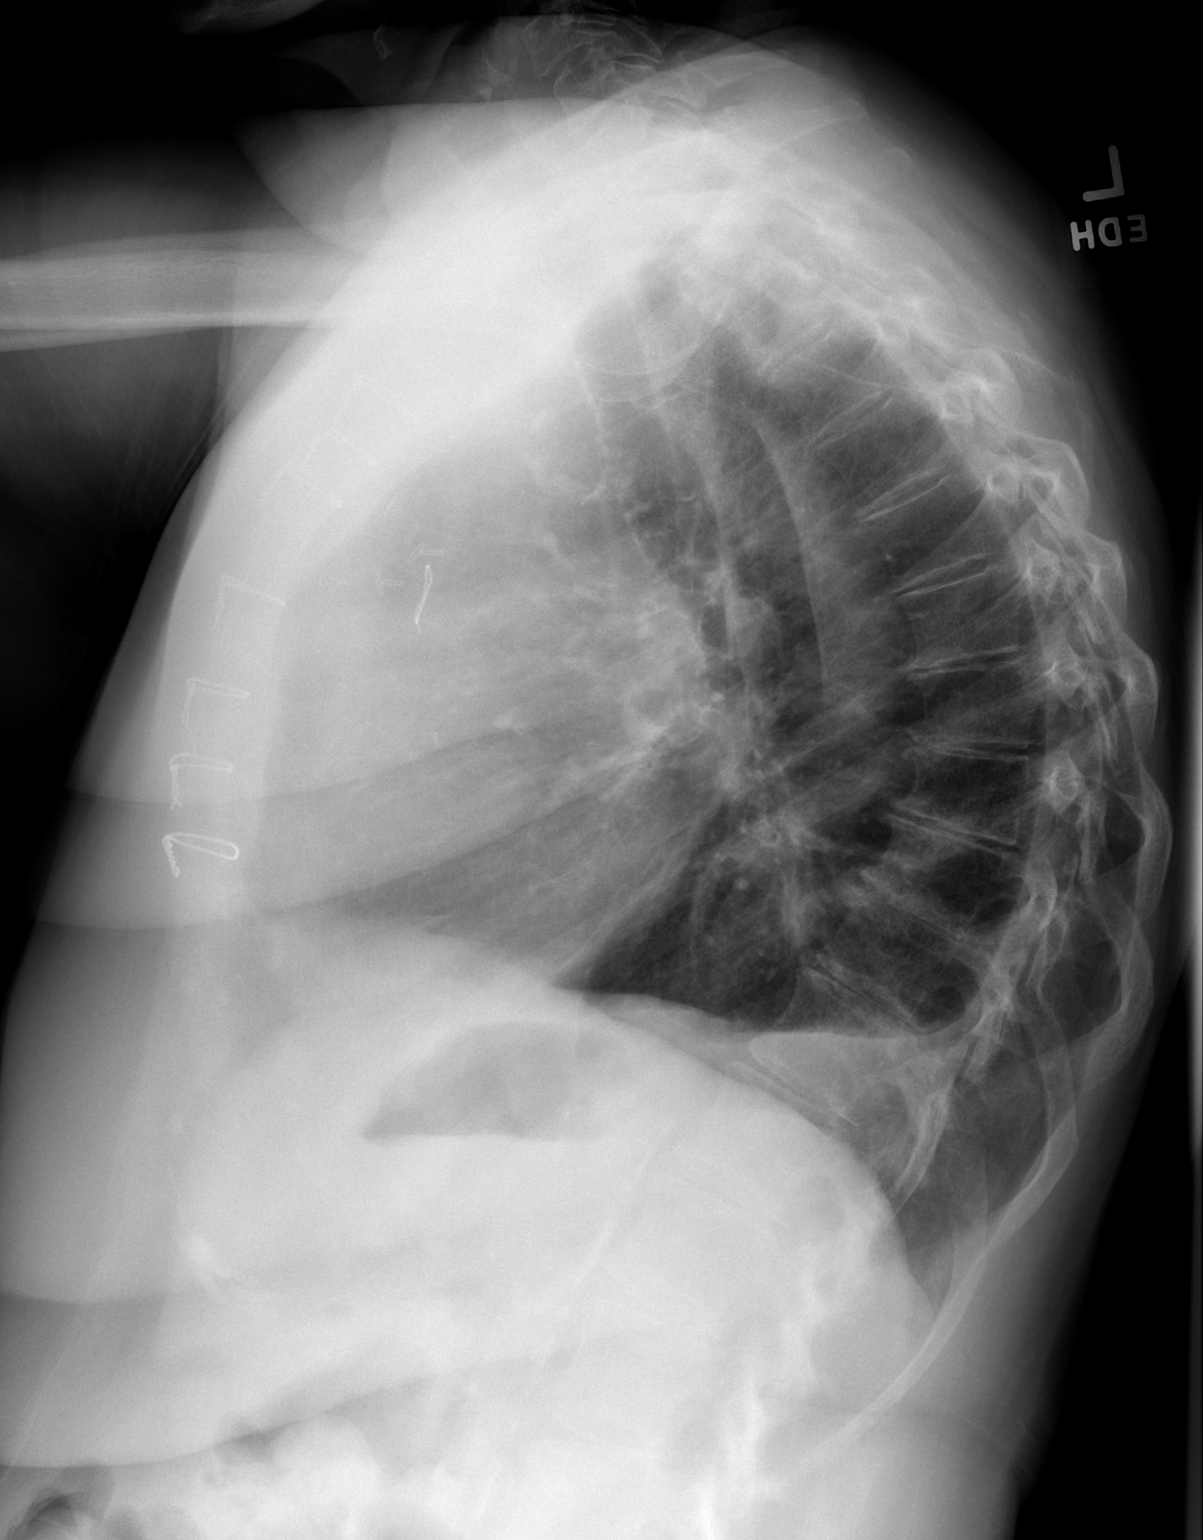

[2 of 2 positions shown; findings below may reference images not displayed]

FINDINGS: Postsurgical changes from CABG.

Cardiomediastinal silhouette is normal. Mediastinal contours appear
intact. Calcific atherosclerotic disease of the aorta.

There is no evidence of pneumothorax. Persistent left pleural
effusion and mild left lower lobe atelectasis.

Osseous structures are without acute abnormality. Soft tissues are
grossly normal.
IMPRESSION: Persistent left pleural effusion and mild left lower lobe
atelectasis.

## 2019-04-05 ENCOUNTER — Other Ambulatory Visit: Payer: Self-pay | Admitting: Family Medicine

## 2019-05-31 ENCOUNTER — Telehealth (INDEPENDENT_AMBULATORY_CARE_PROVIDER_SITE_OTHER): Payer: Medicare Other | Admitting: Cardiology

## 2019-05-31 ENCOUNTER — Encounter: Payer: Medicare Other | Admitting: Family Medicine

## 2019-05-31 ENCOUNTER — Encounter: Payer: Self-pay | Admitting: Cardiology

## 2019-05-31 ENCOUNTER — Telehealth: Payer: Self-pay | Admitting: *Deleted

## 2019-05-31 VITALS — BP 115/71 | HR 111 | Ht 59.0 in | Wt 156.0 lb

## 2019-05-31 DIAGNOSIS — I959 Hypotension, unspecified: Secondary | ICD-10-CM | POA: Diagnosis not present

## 2019-05-31 DIAGNOSIS — I252 Old myocardial infarction: Secondary | ICD-10-CM

## 2019-05-31 DIAGNOSIS — E785 Hyperlipidemia, unspecified: Secondary | ICD-10-CM | POA: Diagnosis not present

## 2019-05-31 DIAGNOSIS — I2510 Atherosclerotic heart disease of native coronary artery without angina pectoris: Secondary | ICD-10-CM | POA: Diagnosis not present

## 2019-05-31 HISTORY — DX: Hyperlipidemia, unspecified: E78.5

## 2019-05-31 NOTE — Telephone Encounter (Signed)
SPOKE TO PATIENT. INSTRUCTION WAS GIVEN FROM TODAY'S VIRTUAL VISIT.  AVS SUMMARY IS MAILED WITH LABSLIPS ATTACHED.  PATIENT VERBALIZED UNDERSTANDING.

## 2019-05-31 NOTE — Assessment & Plan Note (Signed)
She has had issues with hypotension since surgery and actually has had liberalize her salt.  As such, and that I would hold off on adding any medications to avoid any falls or dizziness.  Would like to get her on low-dose beta-blocker if her pressures tend to drift up.

## 2019-05-31 NOTE — Telephone Encounter (Signed)
Called patient to start  Virtual  Appointment. She is outside want be able to get information vitals  Until she is through   patient is aware will call at 4 pm

## 2019-05-31 NOTE — Assessment & Plan Note (Signed)
Status post emergent CABG by Dr. Arvid Right shortly after presenting with STEMI.  Very unusual presenting symptoms, never really had chest pain.  Initial severely reduced EF by LV gram was notably improved with postop echo.  Suspect that there is still some mild anterior hypokinesis (was difficult to assess initially postop because of septal bounce).  She has not had adequate blood pressures since her hospitalization to consider adding even low-dose of beta-blocker or ARB.  She does not like taking medications and essentially told me she does not want to take new meds. She is on high-dose statin and needs lipids to be checked. She is on aspirin and Plavix for post ACS presentation.  She is having some GI upset and bruising, will stop aspirin and continue Plavix  Okay to hold Plavix for procedures and injections --5 days for most injections/procedures and 7 days for any spinal/neurologic injections or procedures.

## 2019-05-31 NOTE — Patient Instructions (Addendum)
Medication Instructions:   Stop Aspirin 81 mg   Make sure that your pain doctor that gives you shots knows that you are taking clopidogrel/Plavix. -->  They may want you to hold this ( i.e. do not take it temporarily) for 5 to 7 days prior to do an injection.   For most shots, you do not need to hold it, but for joint injections and spine injections that do.  For joint injections, hold it for 5 days  For spine injection they hold it for 7 days  When you are not taking/holding this medicine (clopidogrel/Plavix) I do want you to take the 81 mg aspirin up until you restart clopidogrel/Plavix.   If you need a refill on your cardiac medications before your next appointment, please call your pharmacy.   Lab work:  Lipid Panel & Chemistry, CBC - to be drawn @ [facility]-  If you have labs (blood work) drawn today and your tests are completely normal, you will receive your results only by: Marland Kitchen MyChart Message (if you have MyChart) OR . A paper copy in the mail If you have any lab test that is abnormal or we need to change your treatment, we will call you to review the results.  Testing/Procedures:  none  Follow-Up: At [facility], you and your health needs are our priority.  As part of our continuing mission to provide you with exceptional heart care, we have created designated Provider Care Teams.  These Care Teams include your primary Cardiologist (physician) and Advanced Practice Providers (APPs -  Physician Assistants and Nurse Practitioners) who all work together to provide you with the care you need, when you need it. . You will need an IN PERSON follow up appointment in Early Feb 2021.  Please call our office 2 months in advance to schedule this appointment.  You may see Glenetta Hew, MD or one of the following Advanced Practice Providers on your designated Care Team:   . Rosaria Ferries, PA-C . Jory Sims, DNP, ANP  Any Other Special Instructions Will Be Listed  Below (If Applicable).

## 2019-05-31 NOTE — Progress Notes (Signed)
Virtual Visit via Telephone Note   This visit type was conducted due to national recommendations for restrictions regarding the COVID-19 Pandemic (e.g. social distancing) in an effort to limit this patient's exposure and mitigate transmission in our community.  Due to her co-morbid illnesses, this patient is at least at moderate risk for complications without adequate follow up.  This format is felt to be most appropriate for this patient at this time.  The patient did not have access to video technology/had technical difficulties with video requiring transitioning to audio format only (telephone).  All issues noted in this document were discussed and addressed.  No physical exam could be performed with this format.  Please refer to the patient's chart for her  consent to telehealth for [facility].   Patient has given verbal permission to conduct this visit via virtual appointment and to bill insurance 05/31/2019 10:43 PM     Evaluation Performed:  Follow-up visit  Date:  05/31/2019   ID:  Molly, Chandler [birth year], MRN [id number]  Patient Location: Home Provider Location: Home  PCP:  Dianne Dun, MD  Cardiologist:  Bryan Lemma, MD  Electrophysiologist:  None   Chief Complaint:  4-5 month f/u  History of Present Illness:    Molly Chandler is a 84 y.o. female with PMH notable for NH Lymphoma & now LM-LAD 95% CAD status post emergent CABG insetting of Anterior STEMI on October 05, 2018 who presents via audio/video conferencing for a telehealth visit today.  CAD History:   Ant STEMI Jan 27-28/2020: Cath -> dLM-OstLAD 95% (thrombotic), ost Cx 50%. Initial EF 25-35% - anterior HK.  2+ MR. Elevated LVEDP  Day of MI - Sx mostly related to GI Sx of N/V & diarrhea; after a significant bout - had a near syncopal episode - when EMS came, she "blacked out" & EMS indicated STEMI.  Emergent CABGx2 (Dr. Laneta Simmers): LIMA-LAD, SVG-OM.  D/c'd on DAPT.  Prior CV studies:   The  following studies were reviewed today:  Cath reviewed above & image below  Echo 10/11/2018: EF 50-55%. Normal valves.  Abnormal Septal motion (normal post-op).  Normal atrial size.   Molly Chandler was seen for initial post hospital follow-up by Joni Reining, NP on February 24 (she noted feeling weak and not really eating very well.  Potentially had a UTI.  Noticeable anorexia.  Sore throat and cough- > symptoms notably improved by time she was seen in clinic.  Still noted some dyspnea though.  ->  PCP had placed her on doxycycline and Mucinex.  Also gave hydrocodone for coughing.  She was then seen again on April 6 via telemedicine (also by Joni Reining, NP) -> no noted dyspnea, but URI and fatigue symptoms notably improved.  Appetite was still a little bit down and she was down about 10 pounds from preop.  Was asked to start walking about 10 minutes every day and having to stop to rest a little bit. -> Lipid panel was ordered. --> Because of borderline pressures, was not started on beta-blocker or ARB.   Interval History:    Feeling a lot better - appetite improving.  Recovered from URI - but has chronic allergies (in AM - post-nasal gtt). Does gardening, but does not like the heat.  (picking muscardine grapes this AM).  Walks the dog around the [address] lot a couple times per day.  No active angina or CHF Sx.    Cardiovascular ROS: no chest pain or  dyspnea on exertion positive for - occasional lightheadedness & feeling weak up until the last month or so.  Eating better & taking Vit D - now better negative for - edema, irregular heartbeat, orthopnea, palpitations, paroxysmal nocturnal dyspnea, rapid heart rate, shortness of breath or syncope/ near syncope; TIA / amaurosis fugax, claudication  The patient does not have symptoms concerning for COVID-19 infection (fever, chills, cough, or new shortness of breath). Just the allergy Sx.   The patient is practicing social  distancing.  ROS:  Please see the history of present illness.    Review of Systems  Constitutional: Negative for malaise/fatigue and weight loss.  HENT: Positive for congestion (post-nasal gtt). Negative for nosebleeds (only small bits of blood when blowing nose).   Respiratory: Negative for cough (just clearing throat), shortness of breath and wheezing.   Cardiovascular: Negative for leg swelling (has a small "puffy knot on inside of R ankle").  Gastrointestinal: Negative for blood in stool and melena.  Genitourinary: Positive for frequency (nocturia). Negative for dysuria, hematuria and urgency.       Frequent nocturia - wakes up a lot  Musculoskeletal: Positive for joint pain (hips & knees). Negative for falls.       -- Has bilateral Hip & back pain - had been getting intermittent injections (would need to hold for injections).    Neurological: Negative for dizziness, focal weakness, weakness and headaches.  Endo/Heme/Allergies: Positive for environmental allergies. Does not bruise/bleed easily.  Psychiatric/Behavioral: Negative for depression and memory loss. The patient has insomnia (hard to get back to sleep after waking up to urinate). The patient is not nervous/anxious.      Past Medical History:  Diagnosis Date   Cataract    Coronary artery disease with hx of myocardial infarct w/o hx of CABG 11/12/2018   Presented as anterior STEMI --> Cath revealed 95% distal LM-ostial LAD thrombotic lesion -> initial EF 20 to 25% (LV Gram) significant reduced, IABP => to OR ~2 hours after stabilization (due to emergency OR team already working). -> s/p CABG x 2 (LIMA-LAD, SVG-OM) -> post MI echo EF 50 to 55%.   History of ST elevation myocardial infarction (STEMI) 10/05/2018   Denies ever having chest pain.  Had nausea vomiting diarrhea ongoing since March.  After significant diarrhea episode had near syncope followed by syncope --> EMS EKG showed anterior ST elevation   Hyperlipidemia with  target LDL less than 70 05/31/2019   Lymphoma (HCC)    rectal cancer   Osteoporosis    osteopenia   Past Surgical History:  Procedure Laterality Date   CORONARY ARTERY BYPASS GRAFT N/A 10/05/2018   Procedure: CORONARY ARTERY BYPASS GRAFTING (CABG)x 2, LIMA-LAD, (right leg GSV) rSVG-OM (SVG harvest endoscopically);  Surgeon: Alleen Borne, MD;  Location: [facility] OR;  Service: Cardiothoracic;  Laterality: N/A;   CORONARY/GRAFT ACUTE MI REVASCULARIZATION N/A 10/05/2018   no PCI --> IABP --> EMERGENT CABG X 2   IABP INSERTION N/A 10/05/2018   Procedure: IABP Insertion;  Surgeon: Marykay Lex, MD;  Location: [facility] INVASIVE CV LAB;  Service: Cardiovascular;  Laterality: N/A;   LEFT HEART CATH AND CORONARY ANGIOGRAPHY N/A 10/05/2018   Procedure: LEFT HEART CATH AND CORONARY ANGIOGRAPHY;  Surgeon: Marykay Lex, MD;  Location: [facility] INVASIVE CV LAB; STEMI/SYNCOPE:  Cath -> dLM-OstLAD 95% (thrombotic), ost Cx 50%. Initial EF 25-35% - anterior HK.  2+ MR. Elevated LVEDP --> IABP -> CABG   TRANSTHORACIC ECHOCARDIOGRAM  10/11/2018   Post CABG: EF  50-55%. Normal valves.  Abnormal Septal motion (normal post-op).  Normal atrial size.     Diagnostic Dominance: Right    Current Meds  Medication Sig   acetaminophen (TYLENOL) 325 MG tablet Take 650 mg by mouth every 6 (six) hours as needed.   Ascorbic Acid (VITAMIN C PO) Take 1 tablet by mouth.   atorvastatin (LIPITOR) 80 MG tablet Take 1 tablet (80 mg total) by mouth daily at 6 PM.   Cholecalciferol (VITAMIN D PO) Take 1 tablet by mouth daily.    clopidogrel (PLAVIX) 75 MG tablet Take 1 tablet (75 mg total) by mouth daily.   MAGNESIUM-ZINC PO Take 1 tablet by mouth.   Melatonin 10 MG TABS Take 10 mg by mouth as needed.   Multiple Vitamin (MULTIVITAMIN) tablet Take 1 tablet by mouth daily.   omeprazole (PRILOSEC) 40 MG capsule TAKE 1 CAPSULE BY MOUTH ONCE DAILY   Probiotic Product (PROBIOTIC DAILY PO) Take 1 tablet by mouth daily.     [DISCONTINUED] aspirin EC 81 MG EC tablet Take 1 tablet (81 mg total) by mouth daily.     Allergies:   Amoxicillin and Metoclopramide hcl   Social History   Tobacco Use   Smoking status: Former Smoker   Smokeless tobacco: Never Used  Substance Use Topics   Alcohol use: No   Drug use: Not on file     Family Hx: The patient's family history is not on file.   Labs/Other Tests and Data Reviewed:    EKG:  No ECG reviewed.  Recent Labs: 10/06/2018: Magnesium 2.1 11/15/2018: ALT 13; BUN 11; Creatinine, Ser 0.67; Hemoglobin 13.2; Platelets 333.0; Potassium 3.9; Pro B Natriuretic peptide (BNP) 74.0; Sodium 138; TSH 1.93   Recent Lipid Panel - has not been willing to come in for labs since COVID-19. Lab Results  Component Value Date/Time   CHOL 178 10/05/2018 07:08 AM   TRIG 20 10/05/2018 07:08 AM   HDL 47 10/05/2018 07:08 AM   CHOLHDL 3.8 10/05/2018 07:08 AM   LDLCALC 127 (H) 10/05/2018 07:08 AM   LDLDIRECT 156.4 05/30/2010 03:01 PM    Wt Readings from Last 3 Encounters:  05/31/19 156 lb (70.8 kg)  02/16/19 155 lb 12.8 oz (70.7 kg)  12/13/18 154 lb 6.4 oz (70 kg)      Objective:    Vital Signs:  BP 115/71    Pulse (!) 111    Ht 4\' 11"  (1.499 m)    Wt 156 lb (70.8 kg)    BMI 31.51 kg/m   VITAL SIGNS:  reviewed GEN:  no acute distress RESPIRATORY:  normal respiration PSYCH:  normal affect   ASSESSMENT & PLAN:    Problem List Items Addressed This Visit    Coronary artery disease with hx of myocardial infarct w/o hx of CABG - Primary (Chronic)    Status post emergent CABG by Dr. Rexanne Mano shortly after presenting with STEMI.  Very unusual presenting symptoms, never really had chest pain.  Initial severely reduced EF by LV gram was notably improved with postop echo.  Suspect that there is still some mild anterior hypokinesis (was difficult to assess initially postop because of septal bounce).  She has not had adequate blood pressures since her hospitalization to  consider adding even low-dose of beta-blocker or ARB.  She does not like taking medications and essentially told me she does not want to take new meds. She is on high-dose statin and needs lipids to be checked. She is on aspirin and Plavix  for post ACS presentation.  She is having some GI upset and bruising, will stop aspirin and continue Plavix  Okay to hold Plavix for procedures and injections --5 days for most injections/procedures and 7 days for any spinal/neurologic injections or procedures.      Relevant Orders   CBC   Lipid panel   Comprehensive metabolic panel   Hyperlipidemia with target LDL less than 70 (Chronic)    She did not have lipids checked since her MI.  She has been very scared with the COVID-19 lockdown to go anywhere.  We will order lipid panel and chemistry panel as well as CBC.  This can be drawn at the [facility] clinic.  Continue statin at current dose for now.      Relevant Orders   Lipid panel   Comprehensive metabolic panel   Hypotension (Chronic)    She has had issues with hypotension since surgery and actually has had liberalize her salt.  As such, and that I would hold off on adding any medications to avoid any falls or dizziness.  Would like to get her on low-dose beta-blocker if her pressures tend to drift up.      Relevant Orders   CBC      COVID-19 Education: The signs and symptoms of COVID-19 were discussed with the patient and how to seek care for testing (follow up with PCP or arrange E-visit).   The importance of social distancing was discussed today.  Time:   Today, I have spent 35 minutes with the patient with telehealth technology discussing the above problems.     Patient Instructions  Medication Instructions:   Stop Aspirin 81 mg   Make sure that your pain doctor that gives you shots knows that you are taking clopidogrel/Plavix. -->  They may want you to hold this ( i.e. do not take it temporarily) for 5 to 7 days prior  to do an injection.   For most shots, you do not need to hold it, but for joint injections and spine injections that do.  For joint injections, hold it for 5 days  For spine injection they hold it for 7 days  When you are not taking/holding this medicine (clopidogrel/Plavix) I do want you to take the 81 mg aspirin up until you restart clopidogrel/Plavix.   If you need a refill on your cardiac medications before your next appointment, please call your pharmacy.   Lab work:  Lipid Panel & Chemistry, CBC - to be drawn @ [facility]-  If you have labs (blood work) drawn today and your tests are completely normal, you will receive your results only by:  MyChart Message (if you have MyChart) OR  A paper copy in the mail If you have any lab test that is abnormal or we need to change your treatment, we will call you to review the results.  Testing/Procedures:  none  Follow-Up: At [facility], you and your health needs are our priority.  As part of our continuing mission to provide you with exceptional heart care, we have created designated Provider Care Teams.  These Care Teams include your primary Cardiologist (physician) and Advanced Practice Providers (APPs -  Physician Assistants and Nurse Practitioners) who all work together to provide you with the care you need, when you need it.  You will need an IN PERSON follow up appointment in Early Feb 2021.  Please call our office 2 months in advance to schedule this appointment.  You may  see Bryan Lemma, MD or one of the following Advanced Practice Providers on your designated Care Team:    Theodore Demark, PA-C  Joni Reining, DNP, ANP  Any Other Special Instructions Will Be Listed Below (If Applicable).     Signed, Bryan Lemma, MD  05/31/2019 10:43 PM    [facility]

## 2019-05-31 NOTE — Assessment & Plan Note (Signed)
She did not have lipids checked since her MI.  She has been very scared with the COVID-19 lockdown to go anywhere.  We will order lipid panel and chemistry panel as well as CBC.  This can be drawn at the [facility] clinic.  Continue statin at current dose for now.

## 2019-06-02 ENCOUNTER — Encounter: Payer: Self-pay | Admitting: Internal Medicine

## 2019-06-06 ENCOUNTER — Telehealth: Payer: Self-pay | Admitting: Family Medicine

## 2019-06-06 NOTE — Telephone Encounter (Signed)
Pt is asking if she could have her labs set up to be drawn at [facility].    CB:[phone] - please assist pt directly.

## 2019-06-07 NOTE — Telephone Encounter (Signed)
Looks like Dr. Ellyn Hack future ordered labs but other than that she is UTD/thx dmf

## 2019-06-07 NOTE — Telephone Encounter (Signed)
Okay for pt to have labs drawn at [place]? Which labs do you want drawn?

## 2019-06-13 ENCOUNTER — Encounter: Payer: Medicare Other | Admitting: Family Medicine

## 2019-06-15 ENCOUNTER — Other Ambulatory Visit: Payer: Self-pay | Admitting: Family Medicine

## 2019-06-15 DIAGNOSIS — Z1231 Encounter for screening mammogram for malignant neoplasm of breast: Secondary | ICD-10-CM

## 2019-08-01 ENCOUNTER — Ambulatory Visit
Admission: RE | Admit: 2019-08-01 | Discharge: 2019-08-01 | Disposition: A | Discharge status: Home / Self Care | Payer: Medicare Other | Source: Ambulatory Visit | Attending: Family Medicine | Admitting: Family Medicine

## 2019-08-01 ENCOUNTER — Other Ambulatory Visit: Payer: Self-pay

## 2019-08-01 DIAGNOSIS — Z1231 Encounter for screening mammogram for malignant neoplasm of breast: Secondary | ICD-10-CM

## 2019-10-03 ENCOUNTER — Other Ambulatory Visit: Payer: Self-pay

## 2019-10-03 MED ORDER — ATORVASTATIN CALCIUM 80 MG PO TABS: 80.0000 mg | ORAL_TABLET | Freq: Every day | ORAL | 3 refills | Status: DC

## 2019-10-04 ENCOUNTER — Other Ambulatory Visit: Payer: Self-pay

## 2019-10-04 MED ORDER — CLOPIDOGREL BISULFATE 75 MG PO TABS: 75.0000 mg | ORAL_TABLET | Freq: Every day | ORAL | 3 refills | Status: DC

## 2019-11-07 HISTORY — PX: NM MYOVIEW LTD: HXRAD82

## 2019-11-11 ENCOUNTER — Other Ambulatory Visit: Payer: Self-pay | Admitting: Cardiothoracic Surgery

## 2019-11-11 DIAGNOSIS — Z951 Presence of aortocoronary bypass graft: Secondary | ICD-10-CM

## 2019-11-14 ENCOUNTER — Telehealth: Payer: Self-pay | Admitting: Cardiology

## 2019-11-14 NOTE — Telephone Encounter (Signed)
Spoke with patient's daughter. Patient is requiring physical assistance with ambulation. Patient noncompliant with current treatment plan and does not understand current instructions. Daughter to accompany patient to appointment.

## 2019-11-14 NOTE — Telephone Encounter (Signed)
  Pt daughter requesting if she can come in with the pt on her appt 11/17/19. She said pt won't be truthful with her current health state and wanted to be there to make sure pt will be compliant with doctor's order.   Please advise

## 2019-11-17 ENCOUNTER — Encounter: Payer: Self-pay | Admitting: Cardiology

## 2019-11-17 ENCOUNTER — Ambulatory Visit: Payer: Medicare Other | Admitting: Cardiology

## 2019-11-17 ENCOUNTER — Other Ambulatory Visit: Payer: Self-pay

## 2019-11-17 VITALS — BP 122/74 | HR 70 | Ht 59.0 in | Wt 161.4 lb

## 2019-11-17 DIAGNOSIS — R0600 Dyspnea, unspecified: Secondary | ICD-10-CM

## 2019-11-17 DIAGNOSIS — R05 Cough: Secondary | ICD-10-CM

## 2019-11-17 DIAGNOSIS — Z951 Presence of aortocoronary bypass graft: Secondary | ICD-10-CM

## 2019-11-17 DIAGNOSIS — I952 Hypotension due to drugs: Secondary | ICD-10-CM | POA: Diagnosis not present

## 2019-11-17 DIAGNOSIS — I252 Old myocardial infarction: Secondary | ICD-10-CM

## 2019-11-17 DIAGNOSIS — I2510 Atherosclerotic heart disease of native coronary artery without angina pectoris: Secondary | ICD-10-CM

## 2019-11-17 DIAGNOSIS — E785 Hyperlipidemia, unspecified: Secondary | ICD-10-CM | POA: Diagnosis not present

## 2019-11-17 DIAGNOSIS — R059 Cough, unspecified: Secondary | ICD-10-CM

## 2019-11-17 DIAGNOSIS — R0609 Other forms of dyspnea: Secondary | ICD-10-CM

## 2019-11-17 DIAGNOSIS — R079 Chest pain, unspecified: Secondary | ICD-10-CM

## 2019-11-17 MED ORDER — MIDODRINE HCL 5 MG PO TABS: ORAL_TABLET | ORAL | 3 refills | Status: DC

## 2019-11-17 NOTE — Patient Instructions (Signed)
Medication Instructions:   START TAKING MIDODRINE 5 MG EVERY MORNING  IF SYSTOLIC IS LESS THAN [id number] MG IN THE AFTERNOON  TAKE ANOTHER 5 MG DOSE.  *If you need a refill on your cardiac medications before your next appointment, please call your pharmacy*   Lab Work: TSH CMP LIPID CBC WILL NEED TO COME TO OFFICE - FASTING - NOTHING TO EAT OR DRINK THAT MORNING EXCEPT TAKE YOUR MEDICATIONS  If you have labs (blood work) drawn today and your tests are completely normal, you will receive your results only by: Marland Kitchen MyChart Message (if you have MyChart) OR . A paper copy in the mail If you have any lab test that is abnormal or we need to change your treatment, we will call you to review the results.   Testing/Procedures: WILL BE SCHEDULE AT [place] Your physician has requested that you have a lexiscan myoview. Please follow instruction sheet, as given.     Follow-Up: At [facility], you and your health needs are our priority.  As part of our continuing mission to provide you with exceptional heart care, we have created designated Provider Care Teams.  These Care Teams include your primary Cardiologist (physician) and Advanced Practice Providers (APPs -  Physician Assistants and Nurse Practitioners) who all work together to provide you with the care you need, when you need it.  We recommend signing up for the patient portal called "MyChart".  Sign up information is provided on this After Visit Summary.  MyChart is used to connect with patients for Virtual Visits (Telemedicine).  Patients are able to view lab/test results, encounter notes, upcoming appointments, etc.  Non-urgent messages can be sent to your provider as well.   To learn more about what you can do with MyChart, go to [email or web address].    Your next appointment:   1 month(s)  The format for your next appointment:   In Person  Provider:   Glenetta Hew, MD   Other Instructions

## 2019-11-17 NOTE — Progress Notes (Signed)
Primary Care Provider: Lucille Passy, MD Cardiologist: Glenetta Hew, MD Electrophysiologist: None  Clinic Note: Chief Complaint  Patient presents with  . Follow-up    21-month  . Coronary Artery Disease    Having chest pain and shortness of breath    HPI:    Molly Chandler is a 85 y.o. female with a PMH notable for history of non-Hodgkin's lymphoma and recent presentation is acute STEMI found to have distal LEFT MAIN CORONARY ARTERY DISEASE (January 2020) referred for urgent CABG who presents today for 3-month follow-up.  CAD History:   Ant STEMI Jan 27-28/2020:Cath ->dLM-OstLAD 95% (thrombotic), ost Cx 50%. Initial EF 25-35% - anterior HK. 2+ MR. Elevated LVEDP  Day of MI - Sx mostly related to GI Sx of N/V & diarrhea; after a significant bout - had a near syncopal episode - when EMS came, she "blacked out" & EMS indicated STEMI. ? Emergent CABGx2(Dr. Cyndia Bent): LIMA-LAD, SVG-OM. D/c'd on DAPT. ? Echo 10/11/2018: EF 50-55%. Normal valves. Abnormal Septal motion (normal post-op). Normal atrial size.   Molly Chandler was last seen on May 31, 2019 via telemedicine.  She had not been started on a beta-blocker or ARB because of hypotension leading up to this visit.  Hi Marcello Moores R she is feeling much better.  Appetite was improving.  Was starting to get back to doing her gardening and other outdoor activity, but does not like the heat.  (During summer she does like picking her muscadine grapes).  No issues from a cardiac standpoint.  Okay to hold Plavix.  Check CBC, lipid panel and chemistry panel.  Interval Hospitalizations: None  Reviewed  CV studies:    The following studies were reviewed today: (if available, images/films reviewed: From Epic Chart or Care Everywhere) . N/A:  Interval History:   Molly Chandler presents here today with her daughter.  She notes that she is still very short of breath and easily fatigued.  She walks about 300 feet and  want to stop to catch her breath.  She had a pretty significant episode of chest pain last night at roughly 10 PM that lasted about 35 minutes.  With this she felt exhausted and clammy.  She felt very weak.  She has had several shorter episodes off and on. She likes to do yard work and housework, but has not been able to do that.  She likes to do her gardening, but feels too fatigued to do it.  Trying to do her laundry is very difficult.  She has to stop even while folding her clothes.  She also has several week spells where she feels lightheaded and dizzy somewhat clammy.  Denies any rapid irregular heartbeats or palpitations, just maybe feel some forceful heartbeats.  She had an attack that she felt was maybe an anxiety attack this morning.  Some of the shortness of breath may be related to the fact she is a chronic congestion and allergies with lots of mucus and postnasal drip.  Has not really been to getting any benefit from multiple medications including H2 blockers and nasal sprays.  She is tried nasal menthol and nasal steroids.  Every now and then she has tried Mucinex, but apparently had tried Mucinex D as opposed to DM told her that she should not do that with her heart disease.  CV Review of Symptoms (Summary): positive for - chest pain, dyspnea on exertion, edema and Exercise intolerance, fatigue, dizziness and lightheadedness. negative for - irregular heartbeat, loss  of consciousness, orthopnea, palpitations, paroxysmal nocturnal dyspnea, rapid heart rate or TIA or amaurosis fugax symptoms.  Has had some near syncopal type dizziness.  The patient does not have symptoms concerning for COVID-19 infection (fever, chills, cough, or new shortness of breath).  The patient is practicing social distancing & Masking.  Is not sure whether or not she wants to do her Covid shot or not.   REVIEWED OF SYSTEMS   Review of Systems  Constitutional: Positive for malaise/fatigue (no Energy; exercise  intolerance).  HENT: Positive for congestion (lots of mucus & drainage).   Respiratory: Positive for cough (post-nasal gtt) and shortness of breath (DOE).   Gastrointestinal: Negative for blood in stool and melena.  Genitourinary: Negative for hematuria.  Musculoskeletal: Positive for joint pain. Negative for falls.  Neurological: Positive for dizziness (lightheadedness). Negative for headaches.  Endo/Heme/Allergies: Positive for environmental allergies.  Psychiatric/Behavioral: Positive for memory loss. The patient has insomnia. The patient is not nervous/anxious.    Nocturia; joint pain-hips knees), back pain (has required intermittent injections).  Normalities.  Insomnia.  Difficult, to sleep after nocturia.  I have reviewed and (if needed) personally updated the patient's problem list, medications, allergies, past medical and surgical history, social and family history.   PAST MEDICAL HISTORY   Past Medical History:  Diagnosis Date  . Cataract   . Coronary artery disease with hx of myocardial infarct w/o hx of CABG 11/12/2018   Presented as anterior STEMI --> Cath revealed 95% distal LM-ostial LAD thrombotic lesion -> initial EF 20 to 25% (LV Gram) significant reduced, IABP => to OR ~2 hours after stabilization (due to emergency OR team already working). -> s/p CABG x 2 (LIMA-LAD, SVG-OM) -> post MI echo EF 50 to 55%.  Marland Kitchen History of ST elevation myocardial infarction (STEMI) 10/05/2018   Denies ever having chest pain.  Had nausea vomiting diarrhea ongoing since March.  After significant diarrhea episode had near syncope followed by syncope --> EMS EKG showed anterior ST elevation  . Hx of CABG 09/2018   Two-vessel CABG:  LIMA-LAD, SVG-OM  . Hyperlipidemia with target LDL less than 70 05/31/2019  . Lymphoma ([place])    rectal cancer  . Osteoporosis    osteopenia    PAST SURGICAL HISTORY   Past Surgical History:  Procedure Laterality Date  . CORONARY ARTERY BYPASS GRAFT N/A 10/05/2018    Procedure: CORONARY ARTERY BYPASS GRAFTING (CABG)x 2, LIMA-LAD, (right leg GSV) rSVG-OM (SVG harvest endoscopically);  Surgeon: Gaye Pollack, MD;  Location: [facility] OR;  Service: Cardiothoracic;  Laterality: N/A;  . CORONARY/GRAFT ACUTE MI REVASCULARIZATION N/A 10/05/2018   no PCI --> IABP --> EMERGENT CABG X 2  . IABP INSERTION N/A 10/05/2018   Procedure: IABP Insertion;  Surgeon: Leonie Man, MD;  Location: [facility] CV LAB;  Service: Cardiovascular;  Laterality: N/A;  . LEFT HEART CATH AND CORONARY ANGIOGRAPHY N/A 10/05/2018   Procedure: LEFT HEART CATH AND CORONARY ANGIOGRAPHY;  Surgeon: Leonie Man, MD;  Location: [facility] INVASIVE CV LAB; STEMI/SYNCOPE:  Cath -> dLM-OstLAD 95% (thrombotic), ost Cx 50%. Initial EF 25-35% - anterior HK.  2+ MR. Elevated LVEDP --> IABP -> CABG  . TRANSTHORACIC ECHOCARDIOGRAM  10/11/2018   Post CABG: EF 50-55%. Normal valves.  Abnormal Septal motion (normal post-op).  Normal atrial size.    Cath Jan 2020 --> CABG x 2 LIMA-LAD, SVG-OM     MEDICATIONS/ALLERGIES   Current Meds  Medication Sig  . acetaminophen (TYLENOL) 325 MG tablet Take 650 mg  by mouth every 6 (six) hours as needed.  . Ascorbic Acid (VITAMIN C PO) Take 1 tablet by mouth.  Marland Kitchen atorvastatin (LIPITOR) 80 MG tablet Take 1 tablet (80 mg total) by mouth daily at 6 PM.  . Cholecalciferol (VITAMIN D PO) Take 1 tablet by mouth daily.   . clopidogrel (PLAVIX) 75 MG tablet Take 1 tablet (75 mg total) by mouth daily.  Marland Kitchen MAGNESIUM-ZINC PO Take 1 tablet by mouth.  . Melatonin 10 MG TABS Take 10 mg by mouth as needed.  . Multiple Vitamin (MULTIVITAMIN) tablet Take 1 tablet by mouth daily.  Marland Kitchen omeprazole (PRILOSEC) 40 MG capsule TAKE 1 CAPSULE BY MOUTH ONCE DAILY  . Probiotic Product (PROBIOTIC DAILY PO) Take 1 tablet by mouth daily.     Allergies  Allergen Reactions  . Amoxicillin Diarrhea and Nausea And Vomiting    GI intolerance.   . Metoclopramide Hcl Other (See Comments)    Stroke-like  symptoms (dose too high?)    SOCIAL HISTORY/FAMILY HISTORY   Reviewed in Epic:  Pertinent findings: no new information  OBJCTIVE -PE, EKG, labs   Wt Readings from Last 3 Encounters:  11/17/19 161 lb 6.4 oz (73.2 kg)  05/31/19 156 lb (70.8 kg)  02/16/19 155 lb 12.8 oz (70.7 kg)    Physical Exam: BP 122/74   Pulse 70   Ht 4\' 11"  (1.499 m)   Wt 161 lb 6.4 oz (73.2 kg)   BMI 32.60 kg/m  Physical Exam  Constitutional: She is oriented to person, place, and time. She appears well-developed and well-nourished. No distress.  Pleasant, elderly woman.  mildy obese  HENT:  Head: Normocephalic and atraumatic.  Neck: JVD (~8 cm H20) present. No hepatojugular reflux present. Carotid bruit is not present.  Cardiovascular: Normal rate, regular rhythm, S1 normal, S2 normal and intact distal pulses. Exam reveals distant heart sounds. Exam reveals no gallop and no friction rub.  No murmur heard. Pulmonary/Chest: Effort normal and breath sounds normal. No respiratory distress. She has no wheezes. She has no rales.  Abdominal: Soft. Bowel sounds are normal. She exhibits no distension. There is no abdominal tenderness. There is no rebound.  Musculoskeletal:        General: Edema (trivial) present. Normal range of motion.     Cervical back: Normal range of motion and neck supple.  Neurological: She is alert and oriented to person, place, and time.  Psychiatric: She has a normal mood and affect. Her behavior is normal. Judgment and thought content normal.  Vitals reviewed.    Adult ECG Report  Rate: 70 ;  Rhythm: normal sinus rhythm and normal axis, intervals & durations. ;   Narrative Interpretation: normal EKG  Recent Labs:   Lab Results  Component Value Date   CHOL 178 10/05/2018   HDL 47 10/05/2018   LDLCALC 127 (H) 10/05/2018   LDLDIRECT 156.4 05/30/2010   TRIG 20 10/05/2018   CHOLHDL 3.8 10/05/2018   Lab Results  Component Value Date   CREATININE 0.67 11/15/2018   BUN 11  11/15/2018   NA 138 11/15/2018   K 3.9 11/15/2018   CL 101 11/15/2018   CO2 28 11/15/2018   Lab Results  Component Value Date   TSH 1.93 11/15/2018    ASSESSMENT/PLAN    Problem List Items Addressed This Visit    History of ST elevation myocardial infarction (STEMI) (Chronic)   Relevant Orders   Comprehensive metabolic panel   MYOCARDIAL PERFUSION IMAGING   Coronary artery disease with hx  of myocardial infarct w/o hx of CABG - Primary (Chronic)    Status post emergent two-vessel CABG in setting of MI. She is now having intermittent episodes of chest pain as well as exertional dyspnea. Improved EF after CABG.  There was some mild anterior hypokinesis.  New recurrence of chest pain, would like to evaluate her Myoview stress test.  Otherwise she is on statin and Plavix.  Blood pressures are not tolerated beta-blocker or ARB/ACE inhibitor in the past because of hypotension.  Unfortunately with her having lightheaded dizzy spells of near syncope, I am reluctant to consider beta-blocker or even Imdur without knowing that there is evidence of ischemia..  Plan:  Lexiscan Myoview   Continue aspirin Plavix and statin      Relevant Medications   midodrine (PROAMATINE) 5 MG tablet   Other Relevant Orders   EKG 12-Lead (Completed)   TSH   CBC   MYOCARDIAL PERFUSION IMAGING   Hyperlipidemia with target LDL less than 70 (Chronic)    Should be due for labs to be checked. Lipid panel ordered.  Still on statin.      Relevant Medications   midodrine (PROAMATINE) 5 MG tablet   Other Relevant Orders   Lipid panel   Comprehensive metabolic panel   Hypotension (Chronic)    Not able tolerate beta-blocker or ACE inhibitor.  Infectious had lightheadedness and dizziness ever since her surgery.  We did discuss liberalization of salt intake.  Now with her having some real lightheaded spells, [place] to give her a prescription for as needed midodrine to take in the morning and then additional  dose in the afternoon if she is having a dizzy spell.  Hopefully this will then allow [place] to potentially use some antianginal medications.      Relevant Medications   midodrine (PROAMATINE) 5 MG tablet   Other Relevant Orders   Lipid panel   Comprehensive metabolic panel   CBC   MYOCARDIAL PERFUSION IMAGING   S/P CABG x 2   Cough    I think her cough is probably related to congestion.  I think she probably could tolerate using Sudafed now and then it would pressure that was.  Does not sound like she has any signs of heart failure with no PND orthopnea.  We will get an assessment of her EF with Myoview      Chest pain with moderate risk for cardiac etiology    Some of the chest pain episodes do not sound very anginal in nature, but the prolonged spell is somewhat concerning for recurrent angina. Plan: Check Lexiscan Myoview. Depending on the results, would probably consider Ranexa is not high enough for other agents.      Relevant Orders   EKG 12-Lead (Completed)   Comprehensive metabolic panel   TSH   CBC   MYOCARDIAL PERFUSION IMAGING   DOE (dyspnea on exertion)    A lot of this fatigue and exertional dyspnea is probably related to deconditioning.  She did not really seem to recover that much since her surgery.  We can assess for ischemia with Myoview.  Her EF seemed to improved to 50 to 55%, so do not expect this is related to heart failure.      Relevant Orders   EKG 12-Lead (Completed)   Comprehensive metabolic panel   TSH   CBC   MYOCARDIAL PERFUSION IMAGING    Other Visit Diagnoses    Chest pain, unspecified type       Relevant Orders  EKG 12-Lead (Completed)   Comprehensive metabolic panel   TSH   CBC   MYOCARDIAL PERFUSION IMAGING       COVID-19 Education: The signs and symptoms of COVID-19 were discussed with the patient and how to seek care for testing (follow up with PCP or arrange E-visit).   The importance of social distancing was discussed  today.  I spent a total of 11minutes with the patient. >  50% of the time was spent in direct patient consultation.  Additional time spent with chart review  / charting (studies, outside notes, etc): 10 Total Time: 34 min   Current medicines are reviewed at length with the patient today.  (+/- concerns) n/a   Patient Instructions / Medication Changes & Studies & Tests Ordered   Patient Instructions  Medication Instructions:   START TAKING MIDODRINE 5 MG EVERY MORNING  IF SYSTOLIC IS LESS THAN 123XX123 MG IN THE AFTERNOON  TAKE ANOTHER 5 MG DOSE.  *If you need a refill on your cardiac medications before your next appointment, please call your pharmacy*   Lab Work: TSH CMP LIPID CBC WILL NEED TO COME TO OFFICE - FASTING - NOTHING TO EAT OR DRINK THAT MORNING EXCEPT TAKE YOUR MEDICATIONS  If you have labs (blood work) drawn today and your tests are completely normal, you will receive your results only by: Marland Kitchen MyChart Message (if you have MyChart) OR . A paper copy in the mail If you have any lab test that is abnormal or we need to change your treatment, we will call you to review the results.   Testing/Procedures: WILL BE SCHEDULE AT [facility] Your physician has requested that you have a lexiscan myoview. Please follow instruction sheet, as given.     Follow-Up: At [facility], you and your health needs are our priority.  As part of our continuing mission to provide you with exceptional heart care, we have created designated Provider Care Teams.  These Care Teams include your primary Cardiologist (physician) and Advanced Practice Providers (APPs -  Physician Assistants and Nurse Practitioners) who all work together to provide you with the care you need, when you need it.  We recommend signing up for the patient portal called "MyChart".  Sign up information is provided on this After Visit Summary.  MyChart is used to connect with patients for Virtual Visits  (Telemedicine).  Patients are able to view lab/test results, encounter notes, upcoming appointments, etc.  Non-urgent messages can be sent to your provider as well.   To learn more about what you can do with MyChart, go to [email or web address].    Your next appointment:   1 month(s)  The format for your next appointment:   In Person  Provider:   Glenetta Hew, MD   Other Instructions   Studies Ordered:   Orders Placed This Encounter  Procedures  . Lipid panel  . Comprehensive metabolic panel  . TSH  . CBC  . MYOCARDIAL PERFUSION IMAGING  . EKG 12-Lead     Glenetta Hew, M.D., M.S. Interventional Cardiologist   Pager # [phone] Phone # [phone] [address] [place], [place] [address]   Thank you for choosing [facility]!!

## 2019-11-19 ENCOUNTER — Encounter: Payer: Self-pay | Admitting: Cardiology

## 2019-11-19 NOTE — Assessment & Plan Note (Signed)
Status post emergent two-vessel CABG in setting of MI. She is now having intermittent episodes of chest pain as well as exertional dyspnea. Improved EF after CABG.  There was some mild anterior hypokinesis.  New recurrence of chest pain, would like to evaluate her Myoview stress test.  Otherwise she is on statin and Plavix.  Blood pressures are not tolerated beta-blocker or ARB/ACE inhibitor in the past because of hypotension.  Unfortunately with her having lightheaded dizzy spells of near syncope, I am reluctant to consider beta-blocker or even Imdur without knowing that there is evidence of ischemia..  Plan:  Lexiscan Myoview   Continue aspirin Plavix and statin

## 2019-11-19 NOTE — Assessment & Plan Note (Signed)
Should be due for labs to be checked. Lipid panel ordered.  Still on statin.

## 2019-11-19 NOTE — Assessment & Plan Note (Signed)
Not able tolerate beta-blocker or ACE inhibitor.  Infectious had lightheadedness and dizziness ever since her surgery.  We did discuss liberalization of salt intake.  Now with her having some real lightheaded spells, [place] to give her a prescription for as needed midodrine to take in the morning and then additional dose in the afternoon if she is having a dizzy spell.  Hopefully this will then allow [place] to potentially use some antianginal medications.

## 2019-11-19 NOTE — Assessment & Plan Note (Signed)
I think her cough is probably related to congestion.  I think she probably could tolerate using Sudafed now and then it would pressure that was.  Does not sound like she has any signs of heart failure with no PND orthopnea.  We will get an assessment of her EF with Myoview

## 2019-11-19 NOTE — Assessment & Plan Note (Signed)
Some of the chest pain episodes do not sound very anginal in nature, but the prolonged spell is somewhat concerning for recurrent angina. Plan: Check Lexiscan Myoview. Depending on the results, would probably consider Ranexa is not high enough for other agents.

## 2019-11-19 NOTE — Assessment & Plan Note (Signed)
A lot of this fatigue and exertional dyspnea is probably related to deconditioning.  She did not really seem to recover that much since her surgery.  We can assess for ischemia with Myoview.  Her EF seemed to improved to 50 to 55%, so do not expect this is related to heart failure.

## 2019-11-24 ENCOUNTER — Other Ambulatory Visit: Payer: Self-pay | Admitting: Cardiology

## 2019-11-24 ENCOUNTER — Telehealth (HOSPITAL_COMMUNITY): Payer: Self-pay

## 2019-11-24 DIAGNOSIS — I2510 Atherosclerotic heart disease of native coronary artery without angina pectoris: Secondary | ICD-10-CM | POA: Diagnosis not present

## 2019-11-24 DIAGNOSIS — E785 Hyperlipidemia, unspecified: Secondary | ICD-10-CM | POA: Diagnosis not present

## 2019-11-24 DIAGNOSIS — R079 Chest pain, unspecified: Secondary | ICD-10-CM | POA: Diagnosis not present

## 2019-11-24 DIAGNOSIS — I952 Hypotension due to drugs: Secondary | ICD-10-CM | POA: Diagnosis not present

## 2019-11-24 DIAGNOSIS — I252 Old myocardial infarction: Secondary | ICD-10-CM | POA: Diagnosis not present

## 2019-11-24 NOTE — Telephone Encounter (Signed)
Encounter complete. 

## 2019-11-25 LAB — CBC
Hematocrit: 38.9 % (ref 34.0–46.6)
Hemoglobin: 12.4 g/dL (ref 11.1–15.9)
MCH: 30.2 pg (ref 26.6–33.0)
MCHC: 31.9 g/dL (ref 31.5–35.7)
MCV: 95 fL (ref 79–97)
Platelets: 191 10*3/uL (ref 150–450)
RBC: 4.11 x10E6/uL (ref 3.77–5.28)
RDW: 12.8 % (ref 11.7–15.4)
WBC: 5.8 10*3/uL (ref 3.4–10.8)

## 2019-11-25 LAB — COMPREHENSIVE METABOLIC PANEL
ALT: 20 IU/L (ref 0–32)
AST: 27 IU/L (ref 0–40)
Albumin/Globulin Ratio: 2.4 — ABNORMAL HIGH (ref 1.2–2.2)
Albumin: 4.4 g/dL (ref 3.6–4.6)
Alkaline Phosphatase: 79 IU/L (ref 39–117)
BUN/Creatinine Ratio: 27 (ref 12–28)
BUN: 18 mg/dL (ref 8–27)
Bilirubin Total: 0.3 mg/dL (ref 0.0–1.2)
CO2: 26 mmol/L (ref 20–29)
Calcium: 9.7 mg/dL (ref 8.7–10.3)
Chloride: 106 mmol/L (ref 96–106)
Creatinine, Ser: 0.66 mg/dL (ref 0.57–1.00)
GFR calc Af Amer: 95 mL/min/{1.73_m2} (ref >59)
GFR calc non Af Amer: 83 mL/min/{1.73_m2} (ref >59)
Globulin, Total: 1.8 g/dL (ref 1.5–4.5)
Glucose: 87 mg/dL (ref 65–99)
Potassium: 4.6 mmol/L (ref 3.5–5.2)
Sodium: 142 mmol/L (ref 134–144)
Total Protein: 6.2 g/dL (ref 6.0–8.5)

## 2019-11-25 LAB — TSH: TSH: 4.5 u[IU]/mL (ref 0.450–4.500)

## 2019-11-25 LAB — LIPID PANEL
Chol/HDL Ratio: 2.4 ratio (ref 0.0–4.4)
Cholesterol, Total: 100 mg/dL (ref 100–199)
HDL: 41 mg/dL (ref >39)
LDL Chol Calc (NIH): 47 mg/dL (ref 0–99)
Triglycerides: 47 mg/dL (ref 0–149)
VLDL Cholesterol Cal: 12 mg/dL (ref 5–40)

## 2019-11-28 ENCOUNTER — Telehealth: Payer: Self-pay | Admitting: Cardiology

## 2019-11-28 NOTE — Telephone Encounter (Signed)
Pt c/o medication issue:  1. Name of Medication: midodrine (PROAMATINE) 5 MG tablet  2. How are you currently taking this medication (dosage and times per day)?   3. Are you having a reaction (difficulty breathing--STAT)? No  4. What is your medication issue? Patient states she does not understand the instructions for how to take the medication. Please return call to advise.

## 2019-11-28 NOTE — Telephone Encounter (Signed)
Patient Instructions:Medication Instructions:   START TAKING MIDODRINE 5 MG EVERY MORNING  IF SYSTOLIC IS LESS THAN 123XX123 MG IN THE AFTERNOON  TAKE ANOTHER 5 MG DOSE.  Discussed with pt, all questions answered. Verbalized understanding.

## 2019-11-29 ENCOUNTER — Ambulatory Visit (HOSPITAL_COMMUNITY)
Admission: RE | Admit: 2019-11-29 | Discharge: 2019-11-29 | Disposition: A | Discharge status: Home / Self Care | Payer: Medicare Other | Source: Ambulatory Visit | Attending: Cardiovascular Disease | Admitting: Cardiovascular Disease

## 2019-11-29 ENCOUNTER — Other Ambulatory Visit: Payer: Self-pay

## 2019-11-29 DIAGNOSIS — I252 Old myocardial infarction: Secondary | ICD-10-CM

## 2019-11-29 DIAGNOSIS — R0600 Dyspnea, unspecified: Secondary | ICD-10-CM

## 2019-11-29 DIAGNOSIS — I2510 Atherosclerotic heart disease of native coronary artery without angina pectoris: Secondary | ICD-10-CM | POA: Diagnosis not present

## 2019-11-29 DIAGNOSIS — R079 Chest pain, unspecified: Secondary | ICD-10-CM | POA: Diagnosis not present

## 2019-11-29 DIAGNOSIS — I952 Hypotension due to drugs: Secondary | ICD-10-CM | POA: Diagnosis not present

## 2019-11-29 DIAGNOSIS — R0609 Other forms of dyspnea: Secondary | ICD-10-CM

## 2019-11-29 LAB — MYOCARDIAL PERFUSION IMAGING
LV dias vol: 75 mL (ref 46–106)
LV sys vol: 29 mL
Peak HR: 106 {beats}/min
Rest HR: 66 {beats}/min
SDS: 3
SRS: 3
SSS: 6
TID: 0.94

## 2019-11-29 MED ORDER — TECHNETIUM TC 99M TETROFOSMIN IV KIT
10.2000 | PACK | Freq: Once | INTRAVENOUS | Status: AC | PRN
  Administered 2019-11-29: 10.2 via INTRAVENOUS
  Filled 2019-11-29: qty 11

## 2019-11-29 MED ORDER — AMINOPHYLLINE 25 MG/ML IV SOLN
75.0000 mg | Freq: Once | INTRAVENOUS | Status: AC
  Administered 2019-11-29: 75 mg via INTRAVENOUS

## 2019-11-29 MED ORDER — TECHNETIUM TC 99M TETROFOSMIN IV KIT
32.6000 | PACK | Freq: Once | INTRAVENOUS | Status: AC | PRN
  Administered 2019-11-29: 32.6 via INTRAVENOUS
  Filled 2019-11-29: qty 33

## 2019-11-29 MED ORDER — REGADENOSON 0.4 MG/5ML IV SOLN
0.4000 mg | Freq: Once | INTRAVENOUS | Status: AC
  Administered 2019-11-29: 0.4 mg via INTRAVENOUS

## 2019-11-30 NOTE — Progress Notes (Signed)
Virtual Visit via Telephone Note   This visit type was conducted due to national recommendations for restrictions regarding the COVID-19 Pandemic (e.g. social distancing) in an effort to limit this patient's exposure and mitigate transmission in our community.  Due to her co-morbid illnesses, this patient is at least at moderate risk for complications without adequate follow up.  This format is felt to be most appropriate for this patient at this time.  The patient did not have access to video technology/had technical difficulties with video requiring transitioning to audio format only (telephone).  All issues noted in this document were discussed and addressed.  No physical exam could be performed with this format.  Please refer to the patient's chart for her  consent to telehealth for [facility].   Date:  12/01/2019   ID:  Molly Chandler, DOB Jul 20, 1935, MRN [id number]  Patient Location: Home Provider Location: Home  PCP:  Dianne Dun, MD  Cardiologist:  Bryan Lemma, MD  Electrophysiologist:  None   Evaluation Performed:  Follow-Up Visit  Chief Complaint:  Dizziness and Papillations   History of Present Illness:    Molly Chandler is a 85 y.o. female we are following for ongoing assessment and management of coronary artery disease, with history of acute STEMI in 2020 of the left main coronary artery, referred for urgent CABG.  She had CABG x2 with LIMA to LAD, SVG to OM in January 2020.  Echocardiogram revealed EF of 50% to 55% with normal valves.  She was last seen in the office on 11/17/2019 by Dr. Herbie Baltimore, at which time she was complaining of shortness of breath and easy fatigability.  She can only walk about 300 feet without having to stop to breathe.  She also complained of chest discomfort around 10 PM which lasted around 35 minutes the night prior to being seen.    She has been unable to do her yard work or housework without becoming extremely tired.  She also  complained of several spells of being lightheaded and dizzy with clamminess.  As result of the symptoms she was scheduled to have a Myoview stress test.  She was to continue her current medications.  She was not placed on beta-blocker ARB or ACE due to intolerance and hypotension.  Nuclear stress test revealed normal EF, there was no ST segment deviation, there was a medium defect of moderate severity present in the mid anterior lateral and apical lateral location consistent with prior myocardial infarction, this was considered an intermediate risk study.  She now complains of significant heart palpitations with dizziness associated along with fatigue. She has to sit down when this occurs. She is taking midodrine as directed. BP has been pretty stable.   The patient does not have symptoms concerning for COVID-19 infection (fever, chills, cough, or new shortness of breath).    Past Medical History:  Diagnosis Date  . Cataract   . Coronary artery disease with hx of myocardial infarct w/o hx of CABG 11/12/2018   Presented as anterior STEMI --> Cath revealed 95% distal LM-ostial LAD thrombotic lesion -> initial EF 20 to 25% (LV Gram) significant reduced, IABP => to OR ~2 hours after stabilization (due to emergency OR team already working). -> s/p CABG x 2 (LIMA-LAD, SVG-OM) -> post MI echo EF 50 to 55%.  Marland Kitchen History of ST elevation myocardial infarction (STEMI) 10/05/2018   Denies ever having chest pain.  Had nausea vomiting diarrhea ongoing since March.  After significant diarrhea episode  had near syncope followed by syncope --> EMS EKG showed anterior ST elevation  . Hx of CABG 09/2018   Two-vessel CABG:  LIMA-LAD, SVG-OM  . Hyperlipidemia with target LDL less than 70 05/31/2019  . Lymphoma (HCC)    rectal cancer  . Osteoporosis    osteopenia   Past Surgical History:  Procedure Laterality Date  . CORONARY ARTERY BYPASS GRAFT N/A 10/05/2018   Procedure: CORONARY ARTERY BYPASS GRAFTING (CABG)x  2, LIMA-LAD, (right leg GSV) rSVG-OM (SVG harvest endoscopically);  Surgeon: Alleen Borne, MD;  Location: [facility] OR;  Service: Cardiothoracic;  Laterality: N/A;  . CORONARY/GRAFT ACUTE MI REVASCULARIZATION N/A 10/05/2018   no PCI --> IABP --> EMERGENT CABG X 2  . IABP INSERTION N/A 10/05/2018   Procedure: IABP Insertion;  Surgeon: Marykay Lex, MD;  Location: [facility] INVASIVE CV LAB;  Service: Cardiovascular;  Laterality: N/A;  . LEFT HEART CATH AND CORONARY ANGIOGRAPHY N/A 10/05/2018   Procedure: LEFT HEART CATH AND CORONARY ANGIOGRAPHY;  Surgeon: Marykay Lex, MD;  Location: [facility] INVASIVE CV LAB; STEMI/SYNCOPE:  Cath -> dLM-OstLAD 95% (thrombotic), ost Cx 50%. Initial EF 25-35% - anterior HK.  2+ MR. Elevated LVEDP --> IABP -> CABG  . TRANSTHORACIC ECHOCARDIOGRAM  10/11/2018   Post CABG: EF 50-55%. Normal valves.  Abnormal Septal motion (normal post-op).  Normal atrial size.      Current Meds  Medication Sig  . acetaminophen (TYLENOL) 325 MG tablet Take 650 mg by mouth every 6 (six) hours as needed.  . Ascorbic Acid (VITAMIN C PO) Take 1 tablet by mouth.  Marland Kitchen atorvastatin (LIPITOR) 40 MG tablet Take 1 tablet (40 mg total) by mouth daily at 6 PM.  . Cholecalciferol (VITAMIN D PO) Take 1 tablet by mouth daily.   . clopidogrel (PLAVIX) 75 MG tablet Take 1 tablet (75 mg total) by mouth daily.  Marland Kitchen ELDERBERRY PO Take by mouth. Mag, zinc, immune system support  . Melatonin 10 MG TABS Take 10 mg by mouth as needed.  . midodrine (PROAMATINE) 5 MG tablet Take 5 mg in the morning daily if systolic pressure is less than 100 in the afternoon take 5 mg (Patient taking differently: Take 5 mg in the morning daily. If systolic pressure is less than 100 in the afternoon take 5 mg)  . Multiple Vitamin (MULTIVITAMIN) tablet Take 1 tablet by mouth daily.  . Omega-3 Fatty Acids (FISH OIL PO) Take by mouth daily.  Marland Kitchen omeprazole (PRILOSEC) 40 MG capsule TAKE 1 CAPSULE BY MOUTH ONCE DAILY  . Probiotic Product (PROBIOTIC  DAILY PO) Take 1 tablet by mouth daily.   . [DISCONTINUED] atorvastatin (LIPITOR) 80 MG tablet Take 1 tablet (80 mg total) by mouth daily at 6 PM.  . [DISCONTINUED] MAGNESIUM-ZINC PO Take 1 tablet by mouth.     Allergies:   Amoxicillin and Metoclopramide hcl   Social History   Tobacco Use  . Smoking status: Former Games developer  . Smokeless tobacco: Never Used  Substance Use Topics  . Alcohol use: No  . Drug use: Not on file     Family Hx: The patient's family history is not on file.  ROS:   Please see the history of present illness.    All other systems reviewed and are negative.   Prior CV studies:   The following studies were reviewed today: Cath Jan 2020 --> CABG x 2 LIMA-LAD, SVG-OM    Echocardiogram 10/11/2018 1. The left ventricle has low normal systolic function of 50-55%. The  cavity  size is normal. There is no left ventricular wall thickness. Echo  evidence of normal diastolic filling patterns. There is abnormal septal  motion consistent with post-operative  status.  2. Normal left atrial size.  3. Normal right atrial size.  4. Trivial pericardial effusion.  Labs/Other Tests and Data Reviewed:    EKG:  No ECG reviewed.  Recent Labs: 11/24/2019: ALT 20; BUN 18; Creatinine, Ser 0.66; Hemoglobin 12.4; Platelets 191; Potassium 4.6; Sodium 142; TSH 4.500   Recent Lipid Panel Lab Results  Component Value Date/Time   CHOL 100 11/24/2019 08:33 AM   TRIG 47 11/24/2019 08:33 AM   HDL 41 11/24/2019 08:33 AM   CHOLHDL 2.4 11/24/2019 08:33 AM   CHOLHDL 3.8 10/05/2018 07:08 AM   LDLCALC 47 11/24/2019 08:33 AM   LDLDIRECT 156.4 05/30/2010 03:01 PM    Wt Readings from Last 3 Encounters:  12/01/19 158 lb (71.7 kg)  11/29/19 161 lb (73 kg)  11/17/19 161 lb 6.4 oz (73.2 kg)     Objective:    Vital Signs:  BP (!) 116/100   Pulse 72   Ht 4\' 11"  (1.499 m)   Wt 158 lb (71.7 kg)   BMI 31.91 kg/m    VITAL SIGNS:  reviewed GEN:  no acute distress NEURO:  alert  and oriented x 3, no obvious focal deficit PSYCH:  normal affect  ASSESSMENT & PLAN:    1. Palpitations: Symptomatic with dizziness and fatigue. She states that she gets really tired when this occurs. She can feel HR racing at times. I will place a Zio AT cardiac monitor for 2 weeks to assess for frequency and morphology of these palpitations. I have explained that this will come in the mail and that it will be a month before we have conclusive results. She verbalizes understanding.  2.Hypotension: She is taking midodrine to keep BP > 100 systolic. Has not had to have extra 5 mg dose in the pm.   3. Hypercholesterolemia: She is on atorvastatin 80 mg daily. LDL was 46 on last labs. I will decrease this to 40 mg daily.   4. CAD: No complaints of chest pain or DOE.  Continue current regimen with Plavix and ASA with CABG in 2020. Follow CBC. She just had labs last week was not found to be anemic.   COVID-19 Education: The signs and symptoms of COVID-19 were discussed with the patient and how to seek care for testing (follow up with PCP or arrange E-visit).  The importance of social distancing was discussed today.  Time:   Today, I have spent 25 minutes with the patient with telehealth technology discussing the above problems.     Medication Adjustments/Labs and Tests Ordered: Current medicines are reviewed at length with the patient today.  Concerns regarding medicines are outlined above.   Tests Ordered: Orders Placed This Encounter  Procedures  . LONG TERM MONITOR-LIVE TELEMETRY (3-14 DAYS)    Medication Changes: Meds ordered this encounter  Medications  . atorvastatin (LIPITOR) 40 MG tablet    Sig: Take 1 tablet (40 mg total) by mouth daily at 6 PM.    Dispense:  90 tablet    Refill:  3    Disposition:  Follow up 3 months   Signed, Bettey Mare. Liborio Nixon, ANP, [facility]  12/01/2019 11:31 AM    [facility]

## 2019-12-01 ENCOUNTER — Telehealth (INDEPENDENT_AMBULATORY_CARE_PROVIDER_SITE_OTHER): Payer: Medicare Other | Admitting: Adult Health

## 2019-12-01 ENCOUNTER — Ambulatory Visit: Payer: Medicare Other | Admitting: Cardiology

## 2019-12-01 ENCOUNTER — Telehealth: Payer: Self-pay | Admitting: Radiology

## 2019-12-01 ENCOUNTER — Telehealth: Payer: Self-pay | Admitting: Adult Health

## 2019-12-01 VITALS — BP 116/100 | HR 72 | Ht 59.0 in | Wt 158.0 lb

## 2019-12-01 DIAGNOSIS — R002 Palpitations: Secondary | ICD-10-CM | POA: Diagnosis not present

## 2019-12-01 DIAGNOSIS — I2510 Atherosclerotic heart disease of native coronary artery without angina pectoris: Secondary | ICD-10-CM

## 2019-12-01 DIAGNOSIS — R42 Dizziness and giddiness: Secondary | ICD-10-CM

## 2019-12-01 DIAGNOSIS — I959 Hypotension, unspecified: Secondary | ICD-10-CM

## 2019-12-01 DIAGNOSIS — E7800 Pure hypercholesterolemia, unspecified: Secondary | ICD-10-CM

## 2019-12-01 MED ORDER — ATORVASTATIN CALCIUM 40 MG PO TABS: 40.0000 mg | ORAL_TABLET | Freq: Every day | ORAL | 3 refills | Status: DC

## 2019-12-01 NOTE — Telephone Encounter (Signed)
Spoke with patient. Pt wanted to clarify next appointment. Pt informed that someone will call to schedule next appointment. Pt verbalized understanding.

## 2019-12-01 NOTE — Telephone Encounter (Signed)
New message:     Patient calling stating that she has some questions. Please call patient.

## 2019-12-01 NOTE — Telephone Encounter (Signed)
Enrolled patient for a 14 day Zio AT telemetry monitor to be mailed to patients home.

## 2019-12-01 NOTE — Patient Instructions (Addendum)
Medication Instructions:  Decrease Atorvastatin to 40mg  daily *If you need a refill on your cardiac medications before your next appointment, please call your pharmacy*  Lab Work: None ordered this visit  Testing/Procedures: Your physician has recommended that you wear an event monitor for 14 days. Event monitors are medical devices that record the heart's electrical activity. Doctors most often [place] these monitors to diagnose arrhythmias. Arrhythmias are problems with the speed or rhythm of the heartbeat. The monitor is a small, portable device. You can wear one while you do your normal daily activities. This is usually used to diagnose what is causing palpitations/syncope (passing out).  Follow-Up: At [facility], you and your health needs are our priority.  As part of our continuing mission to provide you with exceptional heart care, we have created designated Provider Care Teams.  These Care Teams include your primary Cardiologist (physician) and Advanced Practice Providers (APPs -  Physician Assistants and Nurse Practitioners) who all work together to provide you with the care you need, when you need it.  We recommend signing up for the patient portal called "MyChart".  Sign up information is provided on this After Visit Summary.  MyChart is used to connect with patients for Virtual Visits (Telemedicine).  Patients are able to view lab/test results, encounter notes, upcoming appointments, etc.  Non-urgent messages can be sent to your provider as well.   To learn more about what you can do with MyChart, go to [email or web address].    Your next appointment:   4 - 6 week(s)  The format for your next appointment:   In Person  Provider:   Glenetta Hew, MD  Other Instructions ZIO XT- Long Term Monitor Instructions   Your physician has requested you wear your ZIO patch monitor for 14 days.   This is a single patch monitor.  [employer] supplies one patch monitor per enrollment.   Additional stickers are not available.   Please do not apply patch if you will be having a Nuclear Stress Test, Echocardiogram, Cardiac CT, MRI, or Chest Xray during the time frame you would be wearing the monitor. The patch cannot be worn during these tests.  You cannot remove and re-apply the ZIO XT patch monitor.   Your ZIO patch monitor will be sent USPS Priority mail from [facility] directly to your home address. The monitor may also be mailed to a PO BOX if home delivery is not available.   It may take 3-5 days to receive your monitor after you have been enrolled.   Once you have received you monitor, please review enclosed instructions.  Your monitor has already been registered assigning a specific monitor serial # to you.   Applying the monitor   Shave hair from upper left chest.   Hold abrader disc by orange tab.  Rub abrader in 40 strokes over left upper chest as indicated in your monitor instructions.   Clean area with 4 enclosed alcohol pads .  Use all pads to assure are is cleaned thoroughly.  Let dry.   Apply patch as indicated in monitor instructions.  Patch will be place under collarbone on left side of chest with arrow pointing upward.   Rub patch adhesive wings for 2 minutes.Remove white label marked "1".  Remove white label marked "2".  Rub patch adhesive wings for 2 additional minutes.   While looking in a mirror, press and release button in center of patch.  A small green light will flash 3-4 times .  This  will be your only indicator the monitor has been turned on.     Do not shower for the first 24 hours.  You may shower after the first 24 hours.   Press button if you feel a symptom. You will hear a small click.  Record Date, Time and Symptom in the Patient Log Book.   When you are ready to remove patch, follow instructions on last 2 pages of Patient Log Book.  Stick patch monitor onto last page of Patient Log Book.   Place Patient Log Book in [place] box.   Use locking tab on box and tape box closed securely.  The [employer] and [employer] has [employer] on it.  Please place in mailbox as soon as possible.  Your physician should have your test results approximately 7 days after the monitor has been mailed back to [facility].   Call [facility] at [phone] if you have questions regarding your ZIO XT patch monitor.  Call them immediately if you see an orange light blinking on your monitor.   If your monitor falls off in less than 4 days contact our Monitor department at [phone].  If your monitor becomes loose or falls off after 4 days call [employer] at [phone] for suggestions on securing your monitor.

## 2019-12-06 ENCOUNTER — Encounter (INDEPENDENT_AMBULATORY_CARE_PROVIDER_SITE_OTHER): Payer: Medicare Other

## 2019-12-06 DIAGNOSIS — R002 Palpitations: Secondary | ICD-10-CM

## 2019-12-06 DIAGNOSIS — R42 Dizziness and giddiness: Secondary | ICD-10-CM

## 2019-12-07 DIAGNOSIS — R002 Palpitations: Secondary | ICD-10-CM | POA: Diagnosis not present

## 2019-12-07 DIAGNOSIS — R42 Dizziness and giddiness: Secondary | ICD-10-CM | POA: Diagnosis not present

## 2019-12-27 ENCOUNTER — Ambulatory Visit: Payer: Medicare Other | Admitting: Cardiology

## 2020-01-26 DIAGNOSIS — H401123 Primary open-angle glaucoma, left eye, severe stage: Secondary | ICD-10-CM | POA: Diagnosis not present

## 2020-01-26 DIAGNOSIS — Z9842 Cataract extraction status, left eye: Secondary | ICD-10-CM | POA: Diagnosis not present

## 2020-01-26 DIAGNOSIS — Z9841 Cataract extraction status, right eye: Secondary | ICD-10-CM | POA: Diagnosis not present

## 2020-01-26 DIAGNOSIS — H401111 Primary open-angle glaucoma, right eye, mild stage: Secondary | ICD-10-CM | POA: Diagnosis not present

## 2020-01-26 DIAGNOSIS — H52223 Regular astigmatism, bilateral: Secondary | ICD-10-CM | POA: Diagnosis not present

## 2020-01-26 DIAGNOSIS — H59812 Chorioretinal scars after surgery for detachment, left eye: Secondary | ICD-10-CM | POA: Diagnosis not present

## 2020-01-31 ENCOUNTER — Ambulatory Visit (INDEPENDENT_AMBULATORY_CARE_PROVIDER_SITE_OTHER): Payer: Medicare Other | Admitting: Cardiology

## 2020-01-31 ENCOUNTER — Other Ambulatory Visit: Payer: Self-pay

## 2020-01-31 ENCOUNTER — Encounter: Payer: Self-pay | Admitting: Cardiology

## 2020-01-31 VITALS — BP 130/60 | HR 65 | Temp 96.6°F | Ht 59.0 in | Wt 167.0 lb

## 2020-01-31 DIAGNOSIS — R531 Weakness: Secondary | ICD-10-CM

## 2020-01-31 DIAGNOSIS — E7800 Pure hypercholesterolemia, unspecified: Secondary | ICD-10-CM | POA: Diagnosis not present

## 2020-01-31 DIAGNOSIS — E785 Hyperlipidemia, unspecified: Secondary | ICD-10-CM | POA: Diagnosis not present

## 2020-01-31 DIAGNOSIS — R5383 Other fatigue: Secondary | ICD-10-CM

## 2020-01-31 DIAGNOSIS — I2510 Atherosclerotic heart disease of native coronary artery without angina pectoris: Secondary | ICD-10-CM | POA: Diagnosis not present

## 2020-01-31 DIAGNOSIS — R079 Chest pain, unspecified: Secondary | ICD-10-CM

## 2020-01-31 DIAGNOSIS — I252 Old myocardial infarction: Secondary | ICD-10-CM

## 2020-01-31 DIAGNOSIS — I959 Hypotension, unspecified: Secondary | ICD-10-CM

## 2020-01-31 MED ORDER — ATORVASTATIN CALCIUM 40 MG PO TABS: 40.0000 mg | ORAL_TABLET | Freq: Every day | ORAL | 3 refills | Status: DC

## 2020-01-31 NOTE — Progress Notes (Signed)
Primary Care Provider: Lucille Passy, MD Cardiologist: Glenetta Hew, MD Electrophysiologist: None  Clinic Note: Chief Complaint  Patient presents with  . Follow-up    4-6 weeks.  Test results  . Coronary Artery Disease    No recurrent chest pain  . Palpitations    Relatively normal monitor    HPI:    Molly Chandler is a 85 y.o. female with a PMH below who presents today for 77-month follow-up to discuss study results..  CAD History:   Ant STEMI Jan 27-28/2020:Cath ->dLM-OstLAD 95% (thrombotic), ost Cx 50%. Initial EF 25-35% - anterior HK. 2+ MR. Elevated LVEDP  Day of MI - Sx mostly related to GI Sx of N/V & diarrhea; after a significant bout - had a near syncopal episode - when EMS came, she "blacked out" & EMS indicated STEMI. ? Emergent CABGx2(Dr. Cyndia Bent): LIMA-LAD, SVG-OM. D/c'd on DAPT. ? Echo 10/11/2018: EF 50-55%. Normal valves. Abnormal Septal motion (normal post-op). Normal atrial size.  Molly Chandler was seen by me on March 11 noting shortness of breath and easy fatigability.  Having to stop after 300 feet to take a deep breath.  Also noted intermittent chest discomfort with one episode lasting 35 minutes.  -->  Evaluated with Myoview (read as intermediate risk because of evidence of prior anterior infarct-no ischemia.  No ACE inhibitor/ARB or beta-blocker being used because of hypotension issues-in fact she has been using midodrine as directed for low blood pressures..    Last seen on March 25 by Jory Sims, NP with symptoms of palpitations and dizziness along with fatigue. ->  Zio patch ordered.  Atorvastatin reduced to 40 mg.  As needed midodrine refilled.  Recent Hospitalizations: None  Reviewed  CV studies:    The following studies were reviewed today: (if available, images/films reviewed: From Epic Chart or Care Everywhere) . 11/29/2019 Myoview: INTERMEDIATE RISK.  Normal EF 55 to 65%.  Medium size moderate severity fixed defect in the  mid anterolateral and apical lateral wall consistent with prior INFARCT.  NO EVIDENCE OF ISCHEMIA . 12/30/2019-2-week Zio patch monitor  Predominant underlying rhythm is sinus rhythm: Minimum heart rate 48 bpm, maximum 143 bpm. Average 76 bpm.  Rare (<1% ) isolated PACs with couplets and triplets noted. Rare isolated PVCs with couplets.  7 brief bursts of SVT/PAT: Fastest was 6 beats at a rate of 188 bpm, longest lasted 11 beats average rate 129 bpm.  No prolonged arrhythmias: No atrial fibrillation, flutter or true SVT, VT or VF.Marland Kitchen  No bradycardia arrhythmias other than sinus bradycardia. No pauses or heart block.  Interval History:   Molly Chandler returns today discussed the results of her monitor which were very reassuring.  She has says that she is not really noticing much in the way of any palpitations now.  She is actually feeling a whole lot better now.  She is eating better and is put back on some of the weight.  She is always on the go now doing something--she loves doing yard work gardening and is not having the fatigue issues now..  She does have some balance issues and is using a cane as well as walker.  She is doing a lot more walking now that she had been doing is not noticing any chest tightness or pressure.  No further episodes like she had prior to the the last time I saw her.  She is not having any more the palpitations or any dizzy spells.  She does have  a little bit maybe vertigo lithotomy been related to allergies and sinus issues.  CV Review of Symptoms (Summary) Cardiovascular ROS: positive for - shortness of breath and Short of breath if she overdoes it, but is limited as far as any significant activity by balance issues.;  Off-and-on irregular heartbeat/skipped beats or palpitations but nothing prolonged negative for - chest pain, edema, orthopnea, paroxysmal nocturnal dyspnea, rapid heart rate or shortness of breath  The patient does not have symptoms  concerning for COVID-19 infection (fever, chills, cough, or new shortness of breath).  The patient is practicing social distancing & Masking.--She explains that she is pretty much isolated from most of the population and therefore is reluctant to take the vaccine.   REVIEWED OF SYSTEMS   Review of Systems  Constitutional: Positive for weight loss. Negative for malaise/fatigue (Energy level improving but not back to baseline).  HENT: Positive for congestion (Postnasal drip is improving).   Respiratory: Positive for cough (Better) and shortness of breath (If she overdoes it).   Cardiovascular: Positive for leg swelling (Off and on, not significant).  Gastrointestinal: Negative for blood in stool and melena.  Genitourinary: Negative for hematuria.  Musculoskeletal: Positive for falls, joint pain and myalgias. Negative for back pain.  Neurological: Positive for dizziness (Off and on) and headaches. Negative for focal weakness and weakness.  Psychiatric/Behavioral: Positive for memory loss. Negative for depression. The patient has insomnia. The patient is not nervous/anxious.    I have reviewed and (if needed) personally updated the patient's problem list, medications, allergies, past medical and surgical history, social and family history.   PAST MEDICAL HISTORY   Past Medical History:  Diagnosis Date  . Cataract   . Coronary artery disease with hx of myocardial infarct w/o hx of CABG 11/12/2018   Presented as anterior STEMI --> Cath revealed 95% distal LM-ostial LAD thrombotic lesion -> initial EF 20 to 25% (LV Gram) significant reduced, IABP => to OR ~2 hours after stabilization (due to emergency OR team already working). -> s/p CABG x 2 (LIMA-LAD, SVG-OM) -> post MI echo EF 50 to 55%.  Marland Kitchen History of ST elevation myocardial infarction (STEMI) 10/05/2018   Denies ever having chest pain.  Had nausea vomiting diarrhea ongoing since March.  After significant diarrhea episode had near syncope  followed by syncope --> EMS EKG showed anterior ST elevation  . Hx of CABG 09/2018   Two-vessel CABG:  LIMA-LAD, SVG-OM  . Hyperlipidemia with target LDL less than 70 05/31/2019  . Lymphoma ([place])    rectal cancer  . Osteoporosis    osteopenia    PAST SURGICAL HISTORY   Past Surgical History:  Procedure Laterality Date  . CORONARY ARTERY BYPASS GRAFT N/A 10/05/2018   Procedure: CORONARY ARTERY BYPASS GRAFTING (CABG)x 2, LIMA-LAD, (right leg GSV) rSVG-OM (SVG harvest endoscopically);  Surgeon: Gaye Pollack, MD;  Location: [facility] OR;  Service: Cardiothoracic;  Laterality: N/A;  . CORONARY/GRAFT ACUTE MI REVASCULARIZATION N/A 10/05/2018   no PCI --> IABP --> EMERGENT CABG X 2  . IABP INSERTION N/A 10/05/2018   Procedure: IABP Insertion;  Surgeon: Leonie Man, MD;  Location: [facility];  Service: Cardiovascular;  Laterality: N/A;  . LEFT HEART CATH AND CORONARY ANGIOGRAPHY N/A 10/05/2018   Procedure: LEFT HEART CATH AND CORONARY ANGIOGRAPHY;  Surgeon: Leonie Man, MD;  Location: [facility] INVASIVE CV LAB; STEMI/SYNCOPE:  Cath -> dLM-OstLAD 95% (thrombotic), ost Cx 50%. Initial EF 25-35% - anterior HK.  2+ MR. Elevated LVEDP -->  IABP -> CABG  . NM MYOVIEW LTD  11/2019   INTERMEDIATE RISK.  Normal EF 55 to 65%.  Medium size moderate severity fixed defect in the mid anterolateral and apical lateral wall consistent with prior INFARCT.  NO EVIDENCE OF ISCHEMIA  . TRANSTHORACIC ECHOCARDIOGRAM  10/11/2018   Post CABG: EF 50-55%. Normal valves.  Abnormal Septal motion (normal post-op).  Normal atrial size.    Cath Jan 2020 --> CABG x 2 LIMA-LAD, SVG-OM   MEDICATIONS/ALLERGIES   Current Meds  Medication Sig  . acetaminophen (TYLENOL) 325 MG tablet Take 650 mg by mouth every 6 (six) hours as needed.  . Ascorbic Acid (VITAMIN C PO) Take 1 tablet by mouth.  Marland Kitchen atorvastatin (LIPITOR) 40 MG tablet Take 1 tablet (40 mg total) by mouth daily at 6 PM.  . Cholecalciferol (VITAMIN D PO) Take 1  tablet by mouth daily.   . clopidogrel (PLAVIX) 75 MG tablet Take 1 tablet (75 mg total) by mouth daily.  Marland Kitchen ELDERBERRY PO Take by mouth. Mag, zinc, immune system support  . Melatonin 10 MG TABS Take 10 mg by mouth as needed.  . midodrine (PROAMATINE) 5 MG tablet Take 5 mg in the morning daily if systolic pressure is less than 100 in the afternoon take 5 mg (Patient taking differently: Take 5 mg in the morning daily. If systolic pressure is less than 100 in the afternoon take 5 mg)  . Multiple Vitamin (MULTIVITAMIN) tablet Take 1 tablet by mouth daily.  . Omega-3 Fatty Acids (FISH OIL PO) Take by mouth daily.  Marland Kitchen omeprazole (PRILOSEC) 40 MG capsule TAKE 1 CAPSULE BY MOUTH ONCE DAILY  . Probiotic Product (PROBIOTIC DAILY PO) Take 1 tablet by mouth daily.   . [DISCONTINUED] atorvastatin (LIPITOR) 40 MG tablet Take 1 tablet (40 mg total) by mouth daily at 6 PM.    Allergies  Allergen Reactions  . Amoxicillin Diarrhea and Nausea And Vomiting    GI intolerance.   . Metoclopramide Hcl Other (See Comments)    Stroke-like symptoms (dose too high?)    SOCIAL HISTORY/FAMILY HISTORY   Reviewed in Epic:  Pertinent findings: No changes.  Remains very active.  OBJCTIVE -PE, EKG, labs   Wt Readings from Last 3 Encounters:  01/31/20 167 lb (75.8 kg)  12/01/19 158 lb (71.7 kg)  11/29/19 161 lb (73 kg)    Physical Exam: BP 130/60 (BP Location: Left Arm, Patient Position: Sitting, Cuff Size: Normal)   Pulse 65   Temp (!) 96.6 F (35.9 C)   Ht 4\' 11"  (1.499 m)   Wt 167 lb (75.8 kg)   BMI 33.73 kg/m  Physical Exam  Constitutional: She is oriented to person, place, and time. She appears well-developed and well-nourished. No distress.  Mildly obese.  Well-groomed.  Healthy-appearing.  HENT:  Head: Normocephalic and atraumatic.  Neck: No JVD present.  Cardiovascular: Normal rate, regular rhythm, S1 normal and S2 normal.  Occasional extrasystoles are present. PMI is not displaced. Exam reveals  distant heart sounds. Exam reveals no gallop and no friction rub.  No murmur heard. Pulmonary/Chest: Effort normal and breath sounds normal. No respiratory distress. She has no wheezes. She has no rales.  Abdominal: Soft. Bowel sounds are normal. She exhibits no distension. There is no abdominal tenderness.  No HSM  Musculoskeletal:        General: Edema (Trivial ankle) present. Normal range of motion.     Cervical back: Normal range of motion and neck supple.  Neurological: She is  alert and oriented to person, place, and time.  Psychiatric: She has a normal mood and affect. Her behavior is normal. Judgment and thought content normal.  Pleasant, jovial  Vitals reviewed.   Adult ECG Report Sinus rhythm, rate 65 bpm.  Normal axis, intervals durations.  Recent Labs:  Lab Results  Component Value Date   CHOL 100 11/24/2019   HDL 41 11/24/2019   LDLCALC 47 11/24/2019   LDLDIRECT 156.4 05/30/2010   TRIG 47 11/24/2019   CHOLHDL 2.4 11/24/2019   Lab Results  Component Value Date   CREATININE 0.66 11/24/2019   BUN 18 11/24/2019   NA 142 11/24/2019   K 4.6 11/24/2019   CL 106 11/24/2019   CO2 26 11/24/2019   Lab Results  Component Value Date   TSH 4.500 11/24/2019    ASSESSMENT/PLAN    Problem List Items Addressed This Visit    Coronary artery disease with hx of myocardial infarct w/o hx of CABG (Chronic)    Infarct noted on Myoview, with preserved EF and no ischemia. On stable dose of statin.  As noted above, unable to tolerate beta-blocker ACE inhibitor/ARB (any antihypertensives). With no active angina symptoms, not on classic antianginal medications such as beta-blocker, calcium channel blocker or nitrate.   Is still on maintenance dose Plavix--okay to hold for 5 to 7 days for procedures or surgeries.      Relevant Medications   atorvastatin (LIPITOR) 40 MG tablet   Hyperlipidemia with target LDL less than 70 (Chronic)    Just had labs checked.  Lipids look great.   Statin dose was reduced to 40 mg per my early recommendations.  No myalgias.      Relevant Medications   atorvastatin (LIPITOR) 40 MG tablet   Hypotension (Chronic)    Has had issues with intermittent hypotension, especially orthostatic hypotension.  He had was found to be hypotensive on medications, and unable to tolerate beta-blocker or ACE inhibitor in the past.  Not requiring midodrine anymore.      Relevant Medications   atorvastatin (LIPITOR) 40 MG tablet   Other Relevant Orders   EKG 12-Lead   Chest pain with moderate risk for cardiac etiology (Chronic)    Myoview did not show any evidence of microvascular ischemia.  There is evidence of what appears to be prior infarct not unexpectedly.  No longer having chest pain.  Not currently on any antianginal medications.      Fatigue    Overall this seems to be improving.  I think is her blood pressure levels increase in her activity levels increase with a combination of increasing strength, balance and nutrition, she is doing better overall.      Episodic weakness    Not sure what these episodes are.  Certainly did not have anything significant on the monitor.  I do not think 11 beat runs of SVT would cause any notable symptoms.  In fact, she is not having complaining of any further palpitations.       Other Visit Diagnoses    Hypercholesterolemia    -  Primary   Relevant Medications   atorvastatin (LIPITOR) 40 MG tablet   Coronary artery disease involving native coronary artery of native heart without angina pectoris       Relevant Medications   atorvastatin (LIPITOR) 40 MG tablet   Other Relevant Orders   EKG 12-Lead       COVID-19 Education: The signs and symptoms of COVID-19 were discussed with the patient and how to  seek care for testing (follow up with PCP or arrange E-visit).   The importance of social distancing and COVID-19 vaccination was discussed today.  I spent a total of 74minutes with the patient. >  50%  of the time was spent in direct patient consultation.  Additional time spent with chart review  / charting (studies, outside notes, etc): 8 Total Time: 31 min   Current medicines are reviewed at length with the patient today.  (+/- concerns) n/a  Notice: This dictation was prepared with Dragon dictation along with smaller phrase technology. Any transcriptional errors that result from this process are unintentional and may not be corrected upon review.  Patient Instructions / Medication Changes & Studies & Tests Ordered   Patient Instructions  Medication Instructions:  No changes  *If you need a refill on your cardiac medications before your next appointment, please call your pharmacy*   Lab Work: Not needed    Testing/Procedures: Not needed   Follow-Up: At [facility], you and your health needs are our priority.  As part of our continuing mission to provide you with exceptional heart care, we have created designated Provider Care Teams.  These Care Teams include your primary Cardiologist (physician) and Advanced Practice Providers (APPs -  Physician Assistants and Nurse Practitioners) who all work together to provide you with the care you need, when you need it.  We recommend signing up for the patient portal called "MyChart".  Sign up information is provided on this After Visit Summary.  MyChart is used to connect with patients for Virtual Visits (Telemedicine).  Patients are able to view lab/test results, encounter notes, upcoming appointments, etc.  Non-urgent messages can be sent to your provider as well.   To learn more about what you can do with MyChart, go to [email or web address].    Your next appointment:   4 month(s)  The format for your next appointment:   Virtual Visit   Provider:   Glenetta Hew, MD   Other Instructions     Studies Ordered:   Orders Placed This Encounter  Procedures  . EKG 12-Lead     Glenetta Hew, M.D., M.S. Interventional  Cardiologist   Pager # [phone] Phone # [phone] [address]. [place], [place] [address]   Thank you for choosing Heartcare at [facility]!!

## 2020-01-31 NOTE — Patient Instructions (Signed)
Medication Instructions:  No changes  *If you need a refill on your cardiac medications before your next appointment, please call your pharmacy*   Lab Work: Not needed    Testing/Procedures: Not needed   Follow-Up: At [facility], you and your health needs are our priority.  As part of our continuing mission to provide you with exceptional heart care, we have created designated Provider Care Teams.  These Care Teams include your primary Cardiologist (physician) and Advanced Practice Providers (APPs -  Physician Assistants and Nurse Practitioners) who all work together to provide you with the care you need, when you need it.  We recommend signing up for the patient portal called "MyChart".  Sign up information is provided on this After Visit Summary.  MyChart is used to connect with patients for Virtual Visits (Telemedicine).  Patients are able to view lab/test results, encounter notes, upcoming appointments, etc.  Non-urgent messages can be sent to your provider as well.   To learn more about what you can do with MyChart, go to [email or web address].    Your next appointment:   4 month(s)  The format for your next appointment:   Virtual Visit   Provider:   Glenetta Hew, MD   Other Instructions

## 2020-02-08 ENCOUNTER — Encounter: Payer: Self-pay | Admitting: Cardiology

## 2020-02-08 ENCOUNTER — Telehealth: Payer: Self-pay | Admitting: Cardiology

## 2020-02-08 NOTE — Assessment & Plan Note (Signed)
Has had issues with intermittent hypotension, especially orthostatic hypotension.  He had was found to be hypotensive on medications, and unable to tolerate beta-blocker or ACE inhibitor in the past.  Not requiring midodrine anymore.

## 2020-02-08 NOTE — Assessment & Plan Note (Signed)
Overall this seems to be improving.  I think is her blood pressure levels increase in her activity levels increase with a combination of increasing strength, balance and nutrition, she is doing better overall.

## 2020-02-08 NOTE — Telephone Encounter (Signed)
Patient states she has a UTI and she is requesting to speak with Dr. Allison Quarry nurse regarding whether or not she is eligible to take specific medicationS to treat UTI. She states she would like to ensure that medication will not interfere with heart medications. Please advise.

## 2020-02-08 NOTE — Assessment & Plan Note (Signed)
Myoview did not show any evidence of microvascular ischemia.  There is evidence of what appears to be prior infarct not unexpectedly.  No longer having chest pain.  Not currently on any antianginal medications.

## 2020-02-08 NOTE — Assessment & Plan Note (Signed)
Just had labs checked.  Lipids look great.  Statin dose was reduced to 40 mg per my early recommendations.  No myalgias.

## 2020-02-08 NOTE — Assessment & Plan Note (Signed)
Not sure what these episodes are.  Certainly did not have anything significant on the monitor.  I do not think 11 beat runs of SVT would cause any notable symptoms.  In fact, she is not having complaining of any further palpitations.

## 2020-02-08 NOTE — Telephone Encounter (Signed)
Returned call to patient she stated she has a UTI.She is having pain on urination.She was wanting to know if ok to use Azo Standard.Advised she needs to call PCP.Advised ok to take Azo Standard, but she will need a antibiotic.Stated she will call PCP.

## 2020-02-08 NOTE — Assessment & Plan Note (Addendum)
Infarct noted on Myoview, with preserved EF and no ischemia. On stable dose of statin.  As noted above, unable to tolerate beta-blocker ACE inhibitor/ARB (any antihypertensives). With no active angina symptoms, not on classic antianginal medications such as beta-blocker, calcium channel blocker or nitrate.   Is still on maintenance dose Plavix--okay to hold for 5 to 7 days for procedures or surgeries.

## 2020-02-09 ENCOUNTER — Other Ambulatory Visit: Payer: Self-pay

## 2020-02-09 ENCOUNTER — Telehealth: Payer: Self-pay | Admitting: *Deleted

## 2020-02-09 ENCOUNTER — Encounter: Payer: Self-pay | Admitting: Family Medicine

## 2020-02-09 ENCOUNTER — Ambulatory Visit: Payer: Medicare Other | Admitting: Internal Medicine

## 2020-02-09 ENCOUNTER — Ambulatory Visit (INDEPENDENT_AMBULATORY_CARE_PROVIDER_SITE_OTHER): Payer: Medicare Other | Admitting: Family Medicine

## 2020-02-09 VITALS — BP 140/60 | HR 75 | Temp 97.9°F | Ht 59.0 in | Wt 166.5 lb

## 2020-02-09 DIAGNOSIS — R300 Dysuria: Secondary | ICD-10-CM | POA: Diagnosis not present

## 2020-02-09 LAB — POC URINALSYSI DIPSTICK (AUTOMATED)
Glucose, UA: POSITIVE — AB
Protein, UA: POSITIVE — AB
Spec Grav, UA: 1.025 (ref 1.010–1.025)
Urobilinogen, UA: 4 E.U./dL — AB
pH, UA: 5.5 (ref 5.0–8.0)

## 2020-02-09 MED ORDER — SULFAMETHOXAZOLE-TRIMETHOPRIM 400-80 MG PO TABS: 1.0000 | ORAL_TABLET | Freq: Two times a day (BID) | ORAL | 0 refills | Status: DC

## 2020-02-09 NOTE — Telephone Encounter (Signed)
FYI Dr. Ellyn Hack, plan to clear the patient to [facility] in the morning. This patient had major anterior MI in Jan and underwent 2 vessel CABG due to critical LM disease. Your recent note mentions: ok to hold plavix for 5-7 days prior to surgery. She is on plavix monotherapy. Let me know if there is any changes to the recommendation, otherwise she will be cleared as long as no chest pain.

## 2020-02-09 NOTE — Telephone Encounter (Signed)
   [facility] Pre-operative Risk Assessment    Request for surgical clearance:  1. What type of surgery is being performed? ESI Lumbar   2. When is this surgery scheduled? TBD   3. What type of clearance is required (medical clearance vs. Pharmacy clearance to hold med vs. Both)? pharmacy  4. Are there any medications that need to be held prior to surgery and how long? plavix x 7 days   5. Practice name and name of physician performing surgery? [facility] Dr. Maryjean Ka   6. What is the office phone number? [phone]   7.   What is the office fax number? [phone]  8.   Anesthesia type (None, local, MAC, general) ?    Rhapsody Wolven A Austen Oyster 02/09/2020, 10:13 AM  _________________________________________________________________   (provider comments below)

## 2020-02-09 NOTE — Progress Notes (Signed)
Molly Celaya T. Cyera Balboni, MD, CAQ Sports Medicine  Primary Care and Sports Medicine [facility] [address] [place], [address]  Phone: [phone]  FAX: [phone]  Molly Chandler - 85 y.o. female  MRN [id number]  Date of Birth: 10-20-1934  Date: 02/09/2020  PCP: Dianne Dun, MD  Referral: Dianne Dun, MD  Chief Complaint  Patient presents with  . Burning with Urination    This visit occurred during the SARS-CoV-2 public health emergency.  Safety protocols were in place, including screening questions prior to the visit, additional usage of staff PPE, and extensive cleaning of exam room while observing appropriate contact time as indicated for disinfecting solutions.   Subjective:   This 85 y.o. female patient presents with burning. No vaginal discharge or external irritation.  No STD exposure. No abd pain, no flank pain.  She is a pleasant lady, and she has had some burning with urination since the weekend.  She is not having any urgency at all.  She does have some urinary incontinence.  Review of Systems is noted in the HPI, as appropriate  Objective:   Blood pressure 140/60, pulse 75, temperature 97.9 F (36.6 C), temperature source Temporal, height 4\' 11"  (1.499 m), weight 166 lb 8 oz (75.5 kg), SpO2 95 %.   GEN: WDWN HEENT: Atraumatc, normocephalic. CV: RRR, No M/G/R PULM: CTA B, No wheezes, crackles, or rhonchi ABD: S, NT, ND, +BS, no rebound. No CVAT. No suprapubic tenderness.  Objective Data: Results for orders placed or performed in visit on 02/09/20  POCT Urinalysis Dipstick (Automated)  Result Value Ref Range   Color, UA Orange    Clarity, UA Cloudy    Glucose, UA Positive (A) Negative   Bilirubin, UA 3+    Ketones, UA Trace    Spec Grav, UA 1.025 1.010 - 1.025   Blood, UA Small    pH, UA 5.5 5.0 - 8.0   Protein, UA Positive (A) Negative   Urobilinogen, UA 4.0 (A) 0.2 or 1.0 E.U./dL   Nitrite, UA  Postitive    Leukocytes, UA Large (3+) (A) Negative     Assessment and Plan:     ICD-10-CM   1. Dysuria  R30.0 POCT Urinalysis Dipstick (Automated)    Urine Culture   Positive UA with symptoms of urinary tract infection, treat as such.  Rx with ABX as below. Drink plenty of fluids and supportive care.  Follow-up: No follow-ups on file.  Meds ordered this encounter  Medications  . sulfamethoxazole-trimethoprim (BACTRIM) 400-80 MG tablet    Sig: Take 1 tablet by mouth 2 (two) times daily.    Dispense:  14 tablet    Refill:  0   Orders Placed This Encounter  Procedures  . Urine Culture  . POCT Urinalysis Dipstick (Automated)    Signed,  Yilin Weedon T. Steele Ledonne, MD   Patient's Medications  New Prescriptions   SULFAMETHOXAZOLE-TRIMETHOPRIM (BACTRIM) 400-80 MG TABLET    Take 1 tablet by mouth 2 (two) times daily.  Previous Medications   ACETAMINOPHEN (TYLENOL) 325 MG TABLET    Take 650 mg by mouth every 6 (six) hours as needed.   ASCORBIC ACID (VITAMIN C PO)    Take 1 tablet by mouth.   ATORVASTATIN (LIPITOR) 40 MG TABLET    Take 1 tablet (40 mg total) by mouth daily at 6 PM.   CHOLECALCIFEROL (VITAMIN D PO)    Take 1 tablet by mouth daily.    CLOPIDOGREL (  PLAVIX) 75 MG TABLET    Take 1 tablet (75 mg total) by mouth daily.   ELDERBERRY PO    Take by mouth. Mag, zinc, immune system support   MELATONIN 10 MG TABS    Take 10 mg by mouth as needed.   MIDODRINE (PROAMATINE) 5 MG TABLET    Take 5 mg in the morning daily if systolic pressure is less than 100 in the afternoon take 5 mg   MULTIPLE VITAMIN (MULTIVITAMIN) TABLET    Take 1 tablet by mouth daily.   OMEGA-3 FATTY ACIDS (FISH OIL PO)    Take by mouth daily.   OMEPRAZOLE (PRILOSEC) 40 MG CAPSULE    TAKE 1 CAPSULE BY MOUTH ONCE DAILY   PROBIOTIC PRODUCT (PROBIOTIC DAILY PO)    Take 1 tablet by mouth daily.   Modified Medications   No medications on file  Discontinued Medications   No medications on file

## 2020-02-10 ENCOUNTER — Telehealth: Payer: Self-pay

## 2020-02-10 MED ORDER — CIPROFLOXACIN HCL 250 MG PO TABS: 250.0000 mg | ORAL_TABLET | Freq: Two times a day (BID) | ORAL | 0 refills | Status: DC

## 2020-02-10 NOTE — Telephone Encounter (Signed)
Cipro sent to [facility] in [place] as instructed by Dr. Lorelei Pont.  Molly Chandler notified by telephone to stop the Bactrim and switch to Cipro 250 mg one tablet by mouth twice a day for 7 days.  Patient states understanding.

## 2020-02-10 NOTE — Telephone Encounter (Signed)
   Primary Cardiologist: Glenetta Hew, MD  Chart reviewed as part of pre-operative protocol coverage. Patient was contacted 02/10/2020 in reference to pre-operative risk assessment for pending surgery as outlined below.  Molly Chandler was last seen on 5/25 by Dr. Ellyn Hack.  Since that day, Molly Chandler has done well.  Therefore, based on ACC/AHA guidelines, the patient would be at acceptable risk for the planned procedure without further cardiovascular testing.   I will route this recommendation to the requesting party via Epic fax function and remove from pre-op pool. Please call with questions.  Patient is ok to hold Plavix for 7 days prior to the procedure. However, talking with the patient today, she is under the impression the injection will be done in her hip and not her spine. Please verify the upcoming procedure is indeed a lumbar ESI.  [place], [place] 02/10/2020, 11:13 AM

## 2020-02-10 NOTE — Telephone Encounter (Signed)
Please call  I can't see where she has been prescribed this recently, but it has been on the market for about 50 years, so probably used in the past.  This is not an allergy, but let's just change to something different.  Cipro 250 mg, 1 po bid, #14

## 2020-02-10 NOTE — Telephone Encounter (Deleted)
You prescribed it yesterday when you saw her for a UTI.

## 2020-02-10 NOTE — Telephone Encounter (Signed)
Pt saw Dr Lorelei Pont yesterday was prescribed Bactrim at [place]... pt reports she took 2nd dose last night at dinner time and 1-2 hours later she had a headache and felt a little dizzy.... the dizziness lasted until 12am then resolved.... This morning she reports no headache or dizziness, but she has not taken her AM dose... Pt does not know if she has tried this mediation in the past.... Pt denies any other Sx

## 2020-02-10 NOTE — Addendum Note (Signed)
Addended by: Carter Kitten on: 02/10/2020 10:43 AM   Modules accepted: Orders

## 2020-02-11 LAB — URINE CULTURE
MICRO NUMBER:: 10549297
SPECIMEN QUALITY:: ADEQUATE

## 2020-02-12 NOTE — Telephone Encounter (Signed)
I extensively told it is okay to hold Plavix regardless of the procedure location. Glenetta Hew, MD

## 2020-02-28 DIAGNOSIS — M545 Low back pain: Secondary | ICD-10-CM | POA: Diagnosis not present

## 2020-03-01 NOTE — Telephone Encounter (Signed)
Message sent to [place] for advice.

## 2020-03-01 NOTE — Telephone Encounter (Signed)
Follow Up  Patient is calling in to speak with Dr. Ellyn Hack or his nurse to see when she can start back taking the plavix since the [facility] Lumbar is done. Please call and advise.

## 2020-03-02 NOTE — Telephone Encounter (Signed)
Can probably restart it after  3 days.   Glenetta Hew, MD

## 2020-03-02 NOTE — Telephone Encounter (Signed)
Called patient and informed her to restart Plavix 3 days after the procedure. Patient verbalized understanding and all (if any) questions were answered.

## 2020-03-16 ENCOUNTER — Encounter: Payer: Medicare Other | Admitting: Internal Medicine

## 2020-03-19 ENCOUNTER — Telehealth: Payer: Self-pay | Admitting: Cardiology

## 2020-03-19 NOTE — Telephone Encounter (Signed)
Spoke with pt, her bp is running good at 134/70's. She denies SOB or chest pain. She was given the okay to stop the atorvastatin for 2 weeks to see if it makes her feel better. She will call back after 2 weeks.

## 2020-03-19 NOTE — Telephone Encounter (Signed)
  Patient states that she has been feeling weak and tired for about a week now. She would like to discuss if this is coming from medications she is on or if something else could be going on.

## 2020-04-30 ENCOUNTER — Encounter: Payer: Medicare Other | Admitting: Internal Medicine

## 2020-05-10 ENCOUNTER — Encounter: Payer: Self-pay | Admitting: Cardiology

## 2020-05-10 ENCOUNTER — Telehealth (INDEPENDENT_AMBULATORY_CARE_PROVIDER_SITE_OTHER): Payer: Medicare Other | Admitting: Cardiology

## 2020-05-10 VITALS — BP 136/82 | HR 89 | Wt 164.0 lb

## 2020-05-10 DIAGNOSIS — R630 Anorexia: Secondary | ICD-10-CM

## 2020-05-10 DIAGNOSIS — R0609 Other forms of dyspnea: Secondary | ICD-10-CM

## 2020-05-10 DIAGNOSIS — R0600 Dyspnea, unspecified: Secondary | ICD-10-CM

## 2020-05-10 DIAGNOSIS — Z79899 Other long term (current) drug therapy: Secondary | ICD-10-CM | POA: Diagnosis not present

## 2020-05-10 DIAGNOSIS — E785 Hyperlipidemia, unspecified: Secondary | ICD-10-CM

## 2020-05-10 DIAGNOSIS — I252 Old myocardial infarction: Secondary | ICD-10-CM

## 2020-05-10 DIAGNOSIS — I951 Orthostatic hypotension: Secondary | ICD-10-CM

## 2020-05-10 DIAGNOSIS — I2510 Atherosclerotic heart disease of native coronary artery without angina pectoris: Secondary | ICD-10-CM | POA: Diagnosis not present

## 2020-05-10 DIAGNOSIS — E7800 Pure hypercholesterolemia, unspecified: Secondary | ICD-10-CM | POA: Diagnosis not present

## 2020-05-10 MED ORDER — ATORVASTATIN CALCIUM 40 MG PO TABS: 40.0000 mg | ORAL_TABLET | Freq: Every day | ORAL | 3 refills | Status: DC

## 2020-05-10 MED ORDER — OMEPRAZOLE 40 MG PO CPDR: 40.0000 mg | DELAYED_RELEASE_CAPSULE | Freq: Every day | ORAL | 3 refills | Status: DC

## 2020-05-10 MED ORDER — CLOPIDOGREL BISULFATE 75 MG PO TABS: 75.0000 mg | ORAL_TABLET | Freq: Every day | ORAL | 3 refills | Status: DC

## 2020-05-10 NOTE — Assessment & Plan Note (Signed)
Doing remarkably well after initially recovering from her CABG.  No further chest pain.  Myoview did show evidence of pretty significant anterolateral inferolateral infarct but no ischemia. EF is notably improved following CABG as indicated by the normal EF on Myoview and post CABG echo.  As it stands, her pressures are fairly stable enough with using midodrine every morning.  I do not think to be here yet at a stage we can consider initiating beta-blocker or ACE inhibitor/ARB.  She is not having active symptoms and is active as she would like to be leery of putting a medicine that may reduce her energy level.  She is on Plavix without aspirin and is on statin.

## 2020-05-10 NOTE — Patient Instructions (Signed)
Medication Instructions:  Dr. Ellyn Hack said you can take clopidogrel (Plavix) every other day You can also HOLD the medications for up to 5 days if you have a bad bruise/bleed.  *If you need a refill on your cardiac medications before your next appointment, please call your pharmacy*   Lab Work: FASTING lab work at any [facility] or your PCP office before your next visit with Dr. Ellyn Hack in 6 months - lipid panel, CMET, CBC  If you have labs (blood work) drawn today and your tests are completely normal, you will receive your results only by: Marland Kitchen MyChart Message (if you have MyChart) OR . A paper copy in the mail If you have any lab test that is abnormal or we need to change your treatment, we will call you to review the results.   Testing/Procedures: NONE   Follow-Up: At [facility], you and your health needs are our priority.  As part of our continuing mission to provide you with exceptional heart care, we have created designated Provider Care Teams.  These Care Teams include your primary Cardiologist (physician) and Advanced Practice Providers (APPs -  Physician Assistants and Nurse Practitioners) who all work together to provide you with the care you need, when you need it.  We recommend signing up for the patient portal called "MyChart".  Sign up information is provided on this After Visit Summary.  MyChart is used to connect with patients for Virtual Visits (Telemedicine).  Patients are able to view lab/test results, encounter notes, upcoming appointments, etc.  Non-urgent messages can be sent to your provider as well.   To learn more about what you can do with MyChart, go to [email or web address].    Your next appointment:   6 month(s)  The format for your next appointment:   In Person  Provider:   You may see Glenetta Hew, MD or one of the following Advanced Practice Providers on your designated Care Team:    Rosaria Ferries, PA-C  Jory Sims, DNP, ANP  [place]  Kathlen Mody, PA-C    Other Instructions

## 2020-05-10 NOTE — Assessment & Plan Note (Signed)
I think she is probably still recovering from her CABG when this was initially being discussed.  She may have also been dealing with a cold because as of the last 2 visits, this has not been an issue at all.  She is back to doing what ever she wants to do. Marland Kitchen  Her EF is improved by echo, and denies any heart failure symptoms

## 2020-05-10 NOTE — Assessment & Plan Note (Signed)
This is definitely improved now.  She does her protein shake every day which seems to help her appetite.  She is now eating more and maintaining stable weight.

## 2020-05-10 NOTE — Assessment & Plan Note (Signed)
Thankfully, her intermittent hypotension is improving.  No further orthostatic hypotension episodes.  She is taking it daily midodrine but has not required any further additional Midrin since I last talked to her.  At this point since she is still taking the daily midodrine, I would prefer to avoid exacerbating this by beta-blocker or ACE inhibitor.

## 2020-05-10 NOTE — Progress Notes (Signed)
Virtual Visit via Telephone Note   This visit type was conducted due to national recommendations for restrictions regarding the COVID-19 Pandemic (e.g. social distancing) in an effort to limit this patient's exposure and mitigate transmission in our community.  Due to her co-morbid illnesses, this patient is at least at moderate risk for complications without adequate follow up.  This format is felt to be most appropriate for this patient at this time.  The patient did not have access to video technology/had technical difficulties with video requiring transitioning to audio format only (telephone).  All issues noted in this document were discussed and addressed.  No physical exam could be performed with this format.  Please refer to the patient's chart for her  consent to telehealth for [facility].   Patient has given verbal permission to conduct this visit via virtual appointment and to bill insurance 05/10/2020 10:37 AM     Evaluation Performed:  Follow-up visit  Date:  05/10/2020   ID:  Molly [facility], Molly Chandler [birth year], MRN [id number]  Patient Location: Home Provider Location: Home Office  PCP:  Molly Munroe, NP  Cardiologist:  Molly Lemma, MD  Electrophysiologist:  None   Chief Complaint:   Chief Complaint  Patient presents with  . Follow-up    3-19-month; blood pressure is doing better.  . Coronary Artery Disease    No angina    History of Present Illness:    Molly Chandler is a 85 y.o. female with PMH notable for distal left main disease presenting as a anterior STEMI sent for emergent CABG in January 2020 (initial ischemic cardiomyopathy improved post CABG), with baseline hypotension requiring midodrine, who presents via audio/video conferencing for a telehealth visit today as a 61-month follow-up..  CAD History:   Ant STEMI Jan 27-28/2020:Cath ->dLM-OstLAD 95% (thrombotic), ost Cx 50%. Initial EF 25-35% - anterior HK. 2+ MR. Elevated LVEDP  Day of MI -  Sx mostly related to GI Sx of N/V & diarrhea; after a significant bout - had a near syncopal episode - when EMS came, she "blacked out" & EMS indicated STEMI. ? Emergent CABGx2(Dr. Laneta [facility]): LIMA-LAD, SVG-OM. D/c'd on DAPT. ? Echo 10/11/2018: EF 50-55%. Normal valves. Abnormal Septal motion (normal post-op). Normal atrial size. ? Myoview 11/29/2019 : INTERMEDIATE RISK.  Normal EF 55 to 65%.  Medium size moderate severity fixed defect in the mid anterolateral and apical lateral wall consistent with prior INFARCT.  NO EVIDENCE OF ISCHEMIA\ ? 2-week Zio patch monitor 12/30/2019: Predominant underlying rhythm is sinus rhythm: Minimum heart rate 48 bpm, maximum 143 bpm. Average 76 bpm.  Rare (<1% ) isolated PACs with couplets and triplets noted. Rare isolated PVCs with couplets.  7 brief bursts of SVT/PAT: Fastest was 6 beats at a rate of 188 bpm, longest lasted 11 beats average rate 129 bpm.  No prolonged arrhythmias: No atrial fibrillation, flutter or true SVT, VT or VF.Marland Kitchen  No bradycardia arrhythmias other than sinus bradycardia. No pauses or heart block.  Molly Chandler was last seen on Jan 31, 2020 -> this was to follow-up results of her Myoview stress test and her monitor.  At that point she noted that her palpitations were pretty much doing better.  She had started eating better and was putting back on some her weight.  She was doing her protein shake every morning.  She was taught to get out walking more and not noticing any more the chest tightness or pressure.  She was doing her gardening.  Hospitalizations:  . None  Recent - Interim CV studies:   The following studies were reviewed today: . None:  Inerval History   Molly Chandler is doing well.  She really has no complaints whatsoever.  The biggest thing is that she is getting over a cold with a bad cough, congestion and rhinorrhea.  Her daughter caught a cold after going to visit her son (Molly Chandler's grandson).  Daughter had a  pretty significant cold but no fevers or chills.  She was tested for Covid and was negative.  Therefore Molly Chandler did not go get checked.  She never had any fevers or chills.  She did not have any loss of taste or smell.  Now all she has a little runny nose and she is otherwise doing well.  She tells me she is still doing her protein shake every morning and is eating much better during the day.  She is maintaining her weight and feeling stronger every day.  She is ready for the weather change because she wants get back out and do more gardening, but still tries to get up in the core parts of the day.  She continues to do her daily weights and symptom check in with her insurance company information tablet provided since her surgery.  She has not had any further chest pain or pressure with rest exertion.  No palpitation symptoms.  No heart failure symptoms.  She still takes midodrine every morning, but has not been take any additional dosing.  No further lightheadedness or dizziness.  Blood pressures been running pretty good lately.  Today's reading is probably on the higher when she usually has.  Usually her systolic pressures are in the 161-096 mmHg range.  Thanks though she is not having any syncope or near syncope dizziness spells.  Cardiovascular ROS: no chest pain or dyspnea on exertion negative for - edema, irregular heartbeat, orthopnea, palpitations, paroxysmal nocturnal dyspnea, rapid heart rate, shortness of breath or Syncope/near-syncope, TIA/hemispheric X, claudication   ROS:  Please see the history of present illness.    The patient does not have symptoms concerning for COVID-19 infection (fever, chills, cough, or new shortness of breath). - did have cold -- caught from daughter who was COVID Neg.  Review of Systems  Constitutional: Negative for chills, fever, malaise/fatigue and weight loss.  HENT: Positive for congestion (And runny nose). Negative for sinus pain.   Respiratory: Positive for  cough (Cough is getting much better now.). Negative for shortness of breath.   Gastrointestinal: Negative for blood in stool and melena.  Genitourinary: Positive for frequency. Negative for hematuria.  Musculoskeletal: Negative for joint pain (No complaints) and myalgias.       She has intermittent cramps.  She has a rub that she puts on it that tends keep the cramps well controlled  Neurological: Negative for dizziness, focal weakness and weakness.  Psychiatric/Behavioral: Negative for depression and memory loss. The patient is not nervous/anxious and does not have insomnia.     The patient is practicing social distancing. - Refuses to get Covid Vaccine.   Past Medical History:  Diagnosis Date  . Cataract   . Coronary artery disease with hx of myocardial infarct w/o hx of CABG 10/05/2018   Presented as anterior STEMI --> Cath revealed 95% distal LM-ostial LAD thrombotic lesion -> initial EF 20 to 25% (LV Gram) significant reduced, IABP => to OR ~2 hours after stabilization (due to emergency OR team already working). -> s/p CABG x 2 (LIMA-LAD,  SVG-OM) -> post MI echo EF 50 to 55%.  Marland Kitchen History of ST elevation myocardial infarction (STEMI) 10/05/2018   Denies ever having chest pain.  Had nausea vomiting diarrhea ongoing since March.  After significant diarrhea episode had near syncope followed by syncope --> EMS EKG showed anterior ST elevation  . Hx of CABG 10/06/2018   Two-vessel CABG:  LIMA-LAD, SVG-OM  . Hyperlipidemia with target LDL less than 70 05/31/2019  . Lymphoma (HCC)    rectal cancer  . Osteoporosis    osteopenia    Past Surgical History:  Procedure Laterality Date  . CORONARY ARTERY BYPASS GRAFT N/A 10/05/2018   Procedure: CORONARY ARTERY BYPASS GRAFTING (CABG)x 2, LIMA-LAD, (right leg GSV) rSVG-OM (SVG harvest endoscopically);  Surgeon: Alleen Borne, MD;  Location: [facility];  Service: Cardiothoracic;  Laterality: N/A;  . CORONARY/GRAFT ACUTE MI REVASCULARIZATION N/A  10/05/2018   no PCI --> IABP --> EMERGENT CABG X 2  . IABP INSERTION N/A 10/05/2018   Procedure: IABP Insertion;  Surgeon: Marykay Lex, MD;  Location: [facility] INVASIVE CV LAB;  Service: Cardiovascular;  Laterality: N/A;  . LEFT HEART CATH AND CORONARY ANGIOGRAPHY N/A 10/05/2018   Procedure: LEFT HEART CATH AND CORONARY ANGIOGRAPHY;  Surgeon: Marykay Lex, MD;  Location[facility]; STEMI/SYNCOPE:  Cath -> dLM-OstLAD 95% (thrombotic), ost Cx 50%. Initial EF 25-35% - anterior HK.  2+ MR. Elevated LVEDP --> IABP -> CABG  . NM MYOVIEW LTD  11/2019   INTERMEDIATE RISK.  Normal EF 55 to 65%.  Medium size moderate severity fixed defect in the mid anterolateral and apical lateral wall consistent with prior INFARCT.  NO EVIDENCE OF ISCHEMIA  . TRANSTHORACIC ECHOCARDIOGRAM  10/11/2018   Post CABG: EF 50-55%. Normal valves.  Abnormal Septal motion (normal post-op).  Normal atrial size.       Cath Jan 2020 --> CABG x 2 LIMA-LAD, SVG-OM    Current Meds  Medication Sig  . acetaminophen (TYLENOL) 325 MG tablet Take 650 mg by mouth every 6 (six) hours as needed.  . Ascorbic Acid (VITAMIN C PO) Take 1 tablet by mouth.  Marland Kitchen atorvastatin (LIPITOR) 40 MG tablet Take 1 tablet (40 mg total) by mouth daily at 6 PM.  . Cholecalciferol (VITAMIN D PO) Take 1 tablet by mouth daily.   . clopidogrel (PLAVIX) 75 MG tablet Take 1 tablet (75 mg total) by mouth daily.  Marland Kitchen ELDERBERRY PO Take by mouth. Mag, zinc, immune system support  . Melatonin 10 MG TABS Take 10 mg by mouth as needed.  . midodrine (PROAMATINE) 5 MG tablet Take 5 mg in the morning daily if systolic pressure is less than 100 in the afternoon take 5 mg (Patient taking differently: Take 5 mg in the morning daily. If systolic pressure is less than 100 in the afternoon take 5 mg)  . Multiple Vitamin (MULTIVITAMIN) tablet Take 1 tablet by mouth daily.  . Omega-3 Fatty Acids (FISH OIL PO) Take by mouth daily.  Marland Kitchen omeprazole (PRILOSEC) 40 MG capsule Take  1 capsule (40 mg total) by mouth daily.  . Probiotic Product (PROBIOTIC DAILY PO) Take 1 tablet by mouth daily.   . [DISCONTINUED] atorvastatin (LIPITOR) 40 MG tablet Take 1 tablet (40 mg total) by mouth daily at 6 PM.  . [DISCONTINUED] clopidogrel (PLAVIX) 75 MG tablet Take 1 tablet (75 mg total) by mouth daily.  . [DISCONTINUED] omeprazole (PRILOSEC) 40 MG capsule TAKE 1 CAPSULE BY MOUTH ONCE DAILY     Allergies:  Amoxicillin and Metoclopramide hcl   Social History   Tobacco Use  . Smoking status: Former Games developer  . Smokeless tobacco: Never Used  Substance Use Topics  . Alcohol use: No  . Drug use: Not on file     Family Hx: The patient's family history is not on file.   Labs/Other Tests and Data Reviewed:    EKG:  No ECG reviewed.  Recent Labs: 11/24/2019: ALT 20; BUN 18; Creatinine, Ser 0.66; Hemoglobin 12.4; Platelets 191; Potassium 4.6; Sodium 142; TSH 4.500   Recent Lipid Panel Lab Results  Component Value Date/Time   CHOL 100 11/24/2019 08:33 AM   TRIG 47 11/24/2019 08:33 AM   HDL 41 11/24/2019 08:33 AM   CHOLHDL 2.4 11/24/2019 08:33 AM   CHOLHDL 3.8 10/05/2018 07:08 AM   LDLCALC 47 11/24/2019 08:33 AM   LDLDIRECT 156.4 05/30/2010 03:01 PM    Wt Readings from Last 3 Encounters:  05/10/20 164 lb (74.4 kg)  02/09/20 166 lb 8 oz (75.5 kg)  01/31/20 167 lb (75.8 kg)     Objective:    Vital Signs:  BP 136/82   Pulse 89   Wt 164 lb (74.4 kg)   BMI 33.12 kg/m   VITAL SIGNS:  reviewed Pleasant; no acute distress. A&O x 3.  normal Mood & Affect Non-labored respirations  ASSESSMENT & PLAN:    Problem List Items Addressed This Visit    Hyperlipidemia with target LDL less than 70 (Chronic)   Relevant Medications   atorvastatin (LIPITOR) 40 MG tablet   Other Relevant Orders   Lipid panel   Comprehensive metabolic panel    Other Visit Diagnoses    Hypercholesterolemia    -  Primary   Relevant Medications   atorvastatin (LIPITOR) 40 MG tablet    Medication management       Relevant Orders   CBC   Comprehensive metabolic panel      COVID-19 Education: The signs and symptoms of COVID-19 were discussed with the patient and how to seek care for testing (follow up with PCP or arrange E-visit).   The importance of social distancing was discussed today.  Time:   Today, I have spent 19 minutes with the patient with telehealth technology discussing the above problems.  6 minutes spent charting-25-minute total   Medication Adjustments/Labs and Tests Ordered: Current medicines are reviewed at length with the patient today.  Concerns regarding medicines are outlined above.   Patient Instructions  Medication Instructions:  Dr. Herbie Baltimore said you can take clopidogrel (Plavix) every other day You can also HOLD the medications for up to 5 days if you have a bad bruise/bleed.  *If you need a refill on your cardiac medications before your next appointment, please call your pharmacy*   Lab Work: FASTING lab work at any [facility] or your PCP office before your next visit with Dr. Herbie Baltimore in 6 months - lipid panel, CMET, CBC  If you have labs (blood work) drawn today and your tests are completely normal, you will receive your results only by: Marland Kitchen MyChart Message (if you have MyChart) OR . A paper copy in the mail If you have any lab test that is abnormal or we need to change your treatment, we will call you to review the results.   Testing/Procedures: NONE   Follow-Up: At [facility], you and your health needs are our priority.  As part of our continuing mission to provide you with exceptional heart care, we have created designated Provider Care Teams.  These Care Teams include your primary Cardiologist (physician) and Advanced Practice Providers (APPs -  Physician Assistants and Nurse Practitioners) who all work together to provide you with the care you need, when you need it.  We recommend signing up for the patient portal called  "MyChart".  Sign up information is provided on this After Visit Summary.  MyChart is used to connect with patients for Virtual Visits (Telemedicine).  Patients are able to view lab/test results, encounter notes, upcoming appointments, etc.  Non-urgent messages can be sent to your provider as well.   To learn more about what you can do with MyChart, go to [email or web address].    Your next appointment:   6 month(s)  The format for your next appointment:   In Person  Provider:   You may see Molly Lemma, MD or one of the following Advanced Practice Providers on your designated Care Team:    Theodore Demark, PA-C  Joni Reining, DNP, ANP  Cadence Fransico Michael, [place]    Other Instructions      Signed, Molly Lemma, MD  05/10/2020 10:37 AM    [facility]

## 2020-05-10 NOTE — Assessment & Plan Note (Signed)
Recent labs from March look great.  She will be due for follow-up next year in November to March timeframe.  We will recheck lipids before her 70-month follow-up.  Continue current dose of statin.

## 2020-05-25 ENCOUNTER — Telehealth: Payer: Self-pay | Admitting: Cardiology

## 2020-05-25 MED ORDER — MIDODRINE HCL 5 MG PO TABS: ORAL_TABLET | ORAL | 3 refills | Status: DC

## 2020-05-25 NOTE — Telephone Encounter (Signed)
Spoke with patient regarding her midodrine prescription. Patient states the Dr. Ellyn Hack did not tell her to stop it, but she did not get a refill from [facility] when she got her other medications. Patient wanted to know if the medication can be refilled. Patient stated her SBP has been good and not been less than 100 so she has just been taking the 5mg  of midodrine daily. Advised patient that the prescription was sent in to [facility].  Patient verbalized understanding.

## 2020-05-25 NOTE — Telephone Encounter (Signed)
Pt c/o medication issue:  1. Name of Medication: midodrine (PROAMATINE) 5 MG tablet  2. How are you currently taking this medication (dosage and times per day)? As directed  3. Are you having a reaction (difficulty breathing--STAT)? no  4. What is your medication issue? Patient wants to know if she should continue on this medication. She states a refill was not called in for this and she wants to know if that was by mistake or if she supposed to stop taking.

## 2020-06-06 ENCOUNTER — Telehealth: Payer: Self-pay | Admitting: Cardiology

## 2020-06-06 NOTE — Telephone Encounter (Signed)
New Message:    Pt wanted to know why Dr Ellyn Hack started her on Omeprazole?

## 2020-06-06 NOTE — Telephone Encounter (Signed)
Attempted to call pt x 2 but could not get call to go through.  Phone never rang.  Will leave in triage to see if someone else can get call to go through.

## 2020-06-06 NOTE — Telephone Encounter (Signed)
Returned call to patient, she states the medication she received looks differently than before.  She usually gets it from [facility] and this time it is from [facility].  Advised appearance may change based on the manufacturer that pharmacy gets supply from.  Patient verbalized understanding.

## 2020-06-27 ENCOUNTER — Other Ambulatory Visit: Payer: Self-pay | Admitting: Internal Medicine

## 2020-06-27 DIAGNOSIS — Z1231 Encounter for screening mammogram for malignant neoplasm of breast: Secondary | ICD-10-CM

## 2020-07-02 ENCOUNTER — Telehealth: Payer: Self-pay | Admitting: Cardiology

## 2020-07-02 NOTE — Telephone Encounter (Signed)
Spoke to patient she stated for the past week she has felt tired,no energy,no appetite. No chest pain.No sob.Systolic B/P has been over 100.She wanted to know if any of her medications would be causing her to feel this way.Advised I will send message to [place] for advice.

## 2020-07-02 NOTE — Telephone Encounter (Signed)
Patient called stating for the past week she has been feeling very weak and tired.  She also states when she goes to eat she can't eat she has not appetite. She is wondering if the medication she on can be causing this. When I asked her what medication it was. She said the ones he gave her, those are the only ones she is she taking.

## 2020-07-04 NOTE — Telephone Encounter (Signed)
Pt called in to check on the status of the previous message. She would like to know if Dr Ellyn Hack was able to advise?    Best number [phone]

## 2020-07-06 NOTE — Telephone Encounter (Signed)
Spoke with pt who report she's been having issues with her BP decreasing and having episodes of feeling very weak. She report the other day after moving around her systolic dropped in the 37'V and yesterday 80's. She report after resting it will increase back WNL. Pt report she can cook dinner and clean the house then feel completely wiped out.   Nurse had pt repeat BP while on the phone and report 132/80 HR 80.   Appointment scheduled for next Wednesday 11/3 with Kerin Ransom, PA at 2:15 pm for further evaluations. Will route to MD for any further recommendations.

## 2020-07-06 NOTE — Telephone Encounter (Signed)
Patient c/o Palpitations:  High priority if patient c/o lightheadedness, shortness of breath, or chest pain  1) How long have you had palpitations/irregular HR/ Afib? Irregular HR   Are you having the symptoms now?  Yes It started 2 weeks ago 2) Are you currently experiencing lightheadedness, SOB or CP? Not right now   3) Do you have a history of afib (atrial fibrillation) or irregular heart rhythm? Not since she had heart surgery   4) Have you checked your BP or HR? (document readings if available):    When she was up walking around or doing something. Her bp drops to like 111/77.    5) Are you experiencing any other symptoms? She stated anytime she is up doing anything she gets really tired and her bp drops and she has to sit down   Best number [phone]

## 2020-07-07 NOTE — Telephone Encounter (Signed)
She has midodrine written for to take as needed for low blood pressures.  While he does have her take midodrine twice daily breakfast and midafternoon.  Glenetta Hew, MD

## 2020-07-10 NOTE — Progress Notes (Signed)
Cardiology Office Note  Date:  07/10/2020   ID:  Molly, Chandler Jan 11, 1935, MRN [id number]  PCP:  Lorre Munroe, NP  Cardiologist:  Dr. Herbie Baltimore  _____________  Fatigue/Low BP  _____________   History of Present Illness: Molly Chandler is a 85 y.o. female with pmh of CAD, history of acute STEMI in 2020 of the left main coronary artery, referred for urgent CABG. She had CABG x 2 with LIMA to LAD, SVG to OM in January 2020. She has baseline hypotension requiring midodrine. Echo 10/12/19 revealed EF 50-55% with normal valves. Patient had a Myoview in 11/2019 for CP and SOB which showed EF 62%, no WMA, EF 55-65%, medium sized defect of moderate severity in the mid anterolateral and apical lateral location, intermediate risk study. Heart monitor 12/30/19 which was ordered for palpitations showed NSR, PACs, PVCs, brief SVT/PAT fastest was 188bpm.   The patient was last seen 05/10/20 on telehealth visit and still taking midodrine and denied further dizziness or lightheadedness. SBP 105-157mmHg. She was not requiring additional midodrine doses. Unable to started ACE/ARB.  Today, she reports she is having episodic fatigue. Says this has been gong on for months and hasn't changed. Whenever she stands up she feels tired and has to sit back down. When she sits down she takes her BP and SBP is low, sometimes down into the 70s. After a few minutes she takes her BP again and it is back up to 110-120. Says this happens every time she stands up or exerts herself. Says she does not feel dizzy or lightheaded with this. Denies bleeding issues. No chest pain. No recent fever, chills, nausea or vomiting. She is taking midodrine 5 mg once daily in the am and has not needed to take it in the afternoon. SBP in the morning is usually 110-120. She also has occasional palpitations at night. She denies symptoms of shortness of breath, orthopnea, PND, lower extremity edema, claudication, dizziness, presyncope,  syncope, bleeding, or neurologic sequela.   _____________   Past Medical History:  Diagnosis Date  . Cataract   . Coronary artery disease with hx of myocardial infarct w/o hx of CABG 10/05/2018   Presented as anterior STEMI --> Cath revealed 95% distal LM-ostial LAD thrombotic lesion -> initial EF 20 to 25% (LV Gram) significant reduced, IABP => to OR ~2 hours after stabilization (due to emergency OR team already working). -> s/p CABG x 2 (LIMA-LAD, SVG-OM) -> post MI echo EF 50 to 55%.  Marland Kitchen History of ST elevation myocardial infarction (STEMI) 10/05/2018   Denies ever having chest pain.  Had nausea vomiting diarrhea ongoing since March.  After significant diarrhea episode had near syncope followed by syncope --> EMS EKG showed anterior ST elevation  . Hx of CABG 10/06/2018   Two-vessel CABG:  LIMA-LAD, SVG-OM  . Hyperlipidemia with target LDL less than 70 05/31/2019  . Lymphoma (HCC)    rectal cancer  . Osteoporosis    osteopenia   Past Surgical History:  Procedure Laterality Date  . CORONARY ARTERY BYPASS GRAFT N/A 10/05/2018   Procedure: CORONARY ARTERY BYPASS GRAFTING (CABG)x 2, LIMA-LAD, (right leg GSV) rSVG-OM (SVG harvest endoscopically);  Surgeon: Alleen Borne, MD;  Location: [facility] OR;  Service: Cardiothoracic;  Laterality: N/A;  . CORONARY/GRAFT ACUTE MI REVASCULARIZATION N/A 10/05/2018   no PCI --> IABP --> EMERGENT CABG X 2  . IABP INSERTION N/A 10/05/2018   Procedure: IABP Insertion;  Surgeon: Marykay Lex, MD;  Location: [facility] INVASIVE  CV LAB;  Service: Cardiovascular;  Laterality: N/A;  . LEFT HEART CATH AND CORONARY ANGIOGRAPHY N/A 10/05/2018   Procedure: LEFT HEART CATH AND CORONARY ANGIOGRAPHY;  Surgeon: Marykay Lex, MD;  Location: [facility] INVASIVE CV LAB; STEMI/SYNCOPE:  Cath -> dLM-OstLAD 95% (thrombotic), ost Cx 50%. Initial EF 25-35% - anterior HK.  2+ MR. Elevated LVEDP --> IABP -> CABG  . NM MYOVIEW LTD  11/2019   INTERMEDIATE RISK.  Normal EF 55 to 65%.  Medium size  moderate severity fixed defect in the mid anterolateral and apical lateral wall consistent with prior INFARCT.  NO EVIDENCE OF ISCHEMIA  . TRANSTHORACIC ECHOCARDIOGRAM  10/11/2018   Post CABG: EF 50-55%. Normal valves.  Abnormal Septal motion (normal post-op).  Normal atrial size.    _____________  Current Outpatient Medications  Medication Sig Dispense Refill  . acetaminophen (TYLENOL) 325 MG tablet Take 650 mg by mouth every 6 (six) hours as needed.    . Ascorbic Acid (VITAMIN C PO) Take 1 tablet by mouth.    Marland Kitchen atorvastatin (LIPITOR) 40 MG tablet Take 1 tablet (40 mg total) by mouth daily at 6 PM. 90 tablet 3  . Cholecalciferol (VITAMIN D PO) Take 1 tablet by mouth daily.     . clopidogrel (PLAVIX) 75 MG tablet Take 1 tablet (75 mg total) by mouth daily. 45 tablet 3  . ELDERBERRY PO Take by mouth. Mag, zinc, immune system support    . latanoprost (XALATAN) 0.005 % ophthalmic solution Place 1 drop into both eyes daily.    . Melatonin 10 MG TABS Take 10 mg by mouth as needed.    . midodrine (PROAMATINE) 5 MG tablet Take 5 mg in the morning daily if systolic pressure is less than 100 in the afternoon take 5 mg 180 tablet 3  . Multiple Vitamin (MULTIVITAMIN) tablet Take 1 tablet by mouth daily.    . Omega-3 Fatty Acids (FISH OIL PO) Take by mouth daily.    Marland Kitchen omeprazole (PRILOSEC) 40 MG capsule Take 1 capsule (40 mg total) by mouth daily. 90 capsule 3  . Probiotic Product (PROBIOTIC DAILY PO) Take 1 tablet by mouth daily.      No current facility-administered medications for this visit.   _____________   Allergies:   Amoxicillin and Metoclopramide hcl  _____________   Social History:  The patient  reports that she has quit smoking. She has never used smokeless tobacco. She reports that she does not drink alcohol.  _____________   Family History:  The patient's family history is not on file.  _____________   ROS:  Please see the history of present illness.   Positive for fatigue,   All  other systems are reviewed and negative.  _____________   PHYSICAL EXAM: VS:  There were no vitals taken for this visit. , BMI There is no height or weight on file to calculate BMI. GEN: Well nourished, well developed, in no acute distress  HEENT: normal  Neck: no JVD, carotid bruits, or masses Cardiac: RRR; no murmurs, rubs, or gallops. No clubbing, cyanosis, edema.  Radials/DP/PT 2+ and equal bilaterally.  Respiratory:  clear to auscultation bilaterally, normal work of breathing GI: soft, nontender, nondistended, + BS MS: no deformity or atrophy  Skin: warm and dry, no rash Neuro:  Strength and sensation are intact Psych: euthymic mood, full affect _____________  EKG:   The ekg ordered today shows NSR, 74bpm, nonspecific ST changes  Recent Labs: 11/24/2019: ALT 20; BUN 18; Creatinine, Ser 0.66; Hemoglobin  12.4; Platelets 191; Potassium 4.6; Sodium 142; TSH 4.500  11/24/2019: Chol/HDL Ratio 2.4; Cholesterol, Total 100; HDL 41; LDL Chol Calc (NIH) 47; Triglycerides 47  CrCl cannot be calculated (Patient's most recent lab result is older than the maximum 21 days allowed.).  Wt Readings from Last 3 Encounters:  05/10/20 164 lb (74.4 kg)  02/09/20 166 lb 8 oz (75.5 kg)  01/31/20 167 lb (75.8 kg)    Relevant studies:  Echo 10/11/18 1. The left ventricle has low normal systolic function of 50-55%. The  cavity size is normal. There is no left ventricular wall thickness. Echo  evidence of normal diastolic filling patterns. There is abnormal septal  motion consistent with post-operative  status.  2. Normal left atrial size.  3. Normal right atrial size.  4. Trivial pericardial effusion.   Myoview 11/29/19  Nuclear stress EF: 62%. No wall motion abnormalities  The left ventricular ejection fraction is normal (55-65%).  There was no ST segment deviation noted during stress.  Defect 1: There is a medium defect of moderate severity present in the mid anterolateral and apical  lateral location.  Findings consistent with prior myocardial infarction.  This is an intermediate risk study.  Post CABG LIMA to LAD, SVG to OM   Donato Schultz, MD   Heart Monitor 02/08/2020  Predominant underlying rhythm is sinus rhythm: Minimum heart rate 48 bpm, maximum 143 bpm. Average 76 bpm.  Rare (<1% ) isolated PACs with couplets and triplets noted. Rare isolated PVCs with couplets.  7 brief bursts of SVT/PAT: Fastest was 6 beats at a rate of 188 bpm, longest lasted 11 beats average rate 129 bpm.  No prolonged arrhythmias: No atrial fibrillation, flutter or true SVT, VT or VF.Marland Kitchen  No bradycardia arrhythmias other than sinus bradycardia. No pauses or heart block.   Relatively normal monitor.  No evidence of A. Fib.   _____________   ASSESSMENT AND PLAN:  Fatigue/Orthostatic Hypotension Patient reports episodes of fatigue for several months, they have not changed. Reports that whenever she stands up she feels tired. She denies dizziness or lightheadedness. No chest pain. She sits down and takes her BP and SBPs are normally low, however after a couple minutes BP improves and she goes about her day. She takes midodrine 5mg  once in the morning. he has not needed to take extra midodrine at night. SBP in the morning normally 110-120 and in the afternoon 130s. She has compression stockings but does not like to wear them. She had a heart monitor earlier this year showing NSR with rare PVCs/PACs and brief SVT, no afib. EKG shows NSR with no significant findings. Orthostatics in the office are negative. Will check CBC, BMET, TSH. Can also try abdominal binder and pausing every time she stands up just to see if that improves symptoms.   Orthostatics: Lying: 100/54, 67 Sitting: 129/68, pulse 84 Standing 118/75, pulse 87 Standing at 126/77, pulse 91  CAD s/p CABG Patient had cath followed by urgent CABG in 2020. Myoview  in 11/2019 showed EF 62%, no WMA, EF 55-65%, medium sized defect  of moderate severity in the mid anterolateral and apical lateral location and no ischemia. Patient denies chest pain or shortness of breath.   HLD Lipide profile 11/2019: total 100, HDL 41, LDL 47, TG 47. Continue statin   Disposition:   FU with Dr. Herbie Baltimore in 4 months   Signed, Anavey Coombes David Stall, PA-C 07/10/2020 9:14 PM    _____________ [facility] [address] [place] [address]  [phone] (office) [phone] (fax)

## 2020-07-11 ENCOUNTER — Ambulatory Visit (INDEPENDENT_AMBULATORY_CARE_PROVIDER_SITE_OTHER): Payer: Medicare Other | Admitting: Cardiology

## 2020-07-11 ENCOUNTER — Encounter: Payer: Self-pay | Admitting: Cardiology

## 2020-07-11 ENCOUNTER — Other Ambulatory Visit: Payer: Self-pay

## 2020-07-11 VITALS — BP 122/66 | HR 74 | Ht 59.0 in | Wt 165.2 lb

## 2020-07-11 DIAGNOSIS — R5383 Other fatigue: Secondary | ICD-10-CM

## 2020-07-11 DIAGNOSIS — I951 Orthostatic hypotension: Secondary | ICD-10-CM | POA: Diagnosis not present

## 2020-07-11 DIAGNOSIS — E785 Hyperlipidemia, unspecified: Secondary | ICD-10-CM

## 2020-07-11 DIAGNOSIS — I252 Old myocardial infarction: Secondary | ICD-10-CM | POA: Diagnosis not present

## 2020-07-11 DIAGNOSIS — I2510 Atherosclerotic heart disease of native coronary artery without angina pectoris: Secondary | ICD-10-CM | POA: Diagnosis not present

## 2020-07-11 NOTE — Telephone Encounter (Signed)
Pt concerns were addressed at [place] today 11/3 with Kerin Ransom, [place].

## 2020-07-11 NOTE — Telephone Encounter (Signed)
Yes why do not we have her take her midodrine twice daily until she is seen in follow-up.  Breakfast & mid afternoon.  Glenetta Hew, MD

## 2020-07-11 NOTE — Patient Instructions (Signed)
Medication Instructions:  Your physician recommends that you continue on your current medications as directed. Please refer to the Current Medication list given to you today.  *If you need a refill on your cardiac medications before your next appointment, please call your pharmacy*  Lab Work: Your physician recommends that you return for lab work TODAY  BMET  CBC  TSH  If you have labs (blood work) drawn today and your tests are completely normal, you will receive your results only by: Marland Kitchen MyChart Message (if you have MyChart) OR . A paper copy in the mail If you have any lab test that is abnormal or we need to change your treatment, we will call you to review the results.  Testing/Procedures: NONE ordered at this time of appointment   Follow-Up: At [facility], you and your health needs are our priority.  As part of our continuing mission to provide you with exceptional heart care, we have created designated Provider Care Teams.  These Care Teams include your primary Cardiologist (physician) and Advanced Practice Providers (APPs -  Physician Assistants and Nurse Practitioners) who all work together to provide you with the care you need, when you need it.  We recommend signing up for the patient portal called "MyChart".  Sign up information is provided on this After Visit Summary.  MyChart is used to connect with patients for Virtual Visits (Telemedicine).  Patients are able to view lab/test results, encounter notes, upcoming appointments, etc.  Non-urgent messages can be sent to your provider as well.   To learn more about what you can do with MyChart, go to [email or web address].    Your next appointment:   4 month(s)  The format for your next appointment:   In Person  Provider:   Glenetta Hew, MD  Other Instructions

## 2020-07-12 ENCOUNTER — Telehealth: Payer: Self-pay | Admitting: Cardiology

## 2020-07-12 LAB — BASIC METABOLIC PANEL
BUN/Creatinine Ratio: 26 (ref 12–28)
BUN: 19 mg/dL (ref 8–27)
CO2: 26 mmol/L (ref 20–29)
Calcium: 10 mg/dL (ref 8.7–10.3)
Chloride: 100 mmol/L (ref 96–106)
Creatinine, Ser: 0.74 mg/dL (ref 0.57–1.00)
GFR calc Af Amer: 87 mL/min/{1.73_m2} (ref >59)
GFR calc non Af Amer: 75 mL/min/{1.73_m2} (ref >59)
Glucose: 92 mg/dL (ref 65–99)
Potassium: 4.8 mmol/L (ref 3.5–5.2)
Sodium: 139 mmol/L (ref 134–144)

## 2020-07-12 LAB — CBC
Hematocrit: 39.1 % (ref 34.0–46.6)
Hemoglobin: 13.1 g/dL (ref 11.1–15.9)
MCH: 31.8 pg (ref 26.6–33.0)
MCHC: 33.5 g/dL (ref 31.5–35.7)
MCV: 95 fL (ref 79–97)
Platelets: 233 10*3/uL (ref 150–450)
RBC: 4.12 x10E6/uL (ref 3.77–5.28)
RDW: 11.6 % — ABNORMAL LOW (ref 11.7–15.4)
WBC: 6.5 10*3/uL (ref 3.4–10.8)

## 2020-07-12 LAB — TSH: TSH: 2.41 u[IU]/mL (ref 0.450–4.500)

## 2020-07-12 NOTE — Telephone Encounter (Signed)
I spoke with Ms. Spampinato on the phone today to review her lab results.  Her labs were all normal.  It does not appear dehydration, anemia, or hypothyroidism can account for her symptoms.  I reviewed her symptoms with her in detail.  The patient tells me that she has exertional weakness and shortness of breath.  She noted the other day when she went to pick her muscadines  that she had to stop because she was weak and short of breath. Her B/P readings are low according to her. Once she rests she is okay.  Other activities that bring on the symptoms include cooking and housework.  I reviewed the history of her symptoms pre-CABG.  She tells me she had no chest pain prior to her CABG.  Her complaints were actually nausea and vomiting and diarrhea.  When EMS came to take her to the emergency room they noted acute EKG changes and rerouted her to [facility] where she had her heart catheterization and subsequent CABG.  The patient did have a low risk Myoview earlier this year, I wonder if this could have been matched ischemia.  I will review with Dr. Ellyn Hack further evaluation.  Kerin Ransom PA-C 07/12/2020 9:31 AM

## 2020-07-15 NOTE — Telephone Encounter (Signed)
Does sound somewhat concerning.  I think if symptoms are bad enough, and worrisome to be possible irritable, then I think the only course of action is to consider cardiac catheterization.  Molly Chandler c

## 2020-07-17 ENCOUNTER — Telehealth: Payer: Self-pay | Admitting: Cardiology

## 2020-07-17 NOTE — Telephone Encounter (Signed)
Dot is returning [name]'s call. Please advise.

## 2020-07-17 NOTE — Telephone Encounter (Signed)
Left message to call back and discuss Dr Allison Quarry recommendations  Kerin Ransom PA-C 07/17/2020 12:42 PM

## 2020-07-18 ENCOUNTER — Telehealth: Payer: Self-pay | Admitting: Cardiology

## 2020-07-18 NOTE — Telephone Encounter (Signed)
I spoke with Molly Chandler on the phone today to review her Myoview results.  The plan was to review her symptoms and consider if she needed medical therapy versus a diagnostic catheterization.  The patient tells me that she has been gardening the last couple days as well as taking care of her daughter's chickens.  She is also been mopping her floor and doing her laundry without any symptoms.  I told her for now to monitor symptoms and call us back if she develops any increasing shortness of breath with exertion or chest pain with exertion. If she does I would start with medical rx first and reserve cath for unstable symptoms.  She will keep her follow-up with Dr. Ellyn Hack as scheduled.  Kerin Ransom PA-C 07/18/2020 10:17 AM

## 2020-07-18 NOTE — Telephone Encounter (Signed)
Patient called to make Dr. Ellyn Hack aware that she recently purchased a Renpho Leg Massager. She states it is used on her legs and thighs to stimulate circulation. She would like to know if she is cleared to use this device. Please advise.

## 2020-07-18 NOTE — Telephone Encounter (Signed)
Spoke with Lurena Joiner earlier, since he is calling the patient to discuss possible procedure recommended by Dr. Ellyn Hack anyway, I asked him to inform the patient it is ok to use the device.

## 2020-07-18 NOTE — Telephone Encounter (Signed)
Spoke with Lurena Joiner who will call the patient

## 2020-07-19 NOTE — Telephone Encounter (Signed)
Sounds good.  Brisha Mccabe, MD  

## 2020-08-01 ENCOUNTER — Ambulatory Visit: Payer: Medicare Other

## 2020-08-13 ENCOUNTER — Telehealth: Payer: Self-pay | Admitting: Cardiology

## 2020-08-13 ENCOUNTER — Other Ambulatory Visit: Payer: Self-pay | Admitting: Cardiology

## 2020-08-13 NOTE — Telephone Encounter (Signed)
Spoke with pt, she reports the midodrine is going to be $85 and she does not have the money to pay for it. She reports she is doing well and her bp has not dropped in some time. she has been active and feels good. Will forward to the pharm md to see iof there is an alternative for her.

## 2020-08-13 NOTE — Telephone Encounter (Signed)
New Message:     Pt wants Dr Ellyn Hack  To know that she will not be getting her Midodrine refilled this month. She says it is too expensive, she will need something else please.

## 2020-08-13 NOTE — Telephone Encounter (Signed)
Spoke with pt, she would prefer to try going without the medication to see if she needs it. She is aware the medication is to help her bp stay up. She voiced understanding but is going to finish what she has and then stop the medication.

## 2020-08-13 NOTE — Telephone Encounter (Signed)
She could use GoodRx coupon which would bring her copay down to $52 for 180 tablets if she fills at [facility]. If she has not been needing it frequently, could also refill for 90 tablets instead which would be $30 at [facility] using GoodRx coupon.

## 2020-09-06 ENCOUNTER — Telehealth: Payer: Self-pay | Admitting: Cardiology

## 2020-09-06 NOTE — Telephone Encounter (Signed)
Called patient, advised of message from PharmD.  Patient verbalized understanding.   

## 2020-09-06 NOTE — Telephone Encounter (Signed)
Patient states she has been experiencing continuous congestion and a mild cough for several years and she would like to know if Dr. Herbie Baltimore can prescribe a medication to relieve some of the congestion. She states she has been taking Mucinex, but it is not helping and she does not know what to do next. She denies pain and other symptoms. Please call.

## 2020-09-06 NOTE — Telephone Encounter (Signed)
Called patient, she states that she just wants something otc to take- but wants to make sure it does not effect her heart, or other medications that she is taking.   Advised I could ask PharmD to advise, and would call back. Patient verbalized understanding.

## 2020-09-06 NOTE — Telephone Encounter (Signed)
Non pharmacologic strategies to help with her congestion include: nasal saline spray if congestion is in her nose rather than chest, staying hydrated with water, and using a humidifier.  Other pharmacologic aids she can try: nasal decongestant like oxymetazoline (nasal spray if congestion is in her nose) or phenylephrine (pill) - should limit use of nasal spray to < 3 days. She should keep an eye on her blood pressure as using these medications can raise her BP.   If her congestion is allergy related, would recommend trying an antihistamine like Zyrtec or if symptoms are in her nose, a steroid nasal spray like Flonase.

## 2020-09-14 ENCOUNTER — Ambulatory Visit: Payer: Medicare Other

## 2020-10-16 ENCOUNTER — Telehealth: Payer: Self-pay | Admitting: Internal Medicine

## 2020-10-16 ENCOUNTER — Other Ambulatory Visit: Payer: Self-pay | Admitting: Family Medicine

## 2020-10-16 ENCOUNTER — Telehealth: Payer: Self-pay | Admitting: Cardiology

## 2020-10-16 NOTE — Telephone Encounter (Signed)
Please call and triage patient.  

## 2020-10-16 NOTE — Telephone Encounter (Signed)
Patient is requesting "Dr Lillie Fragmin "nurse call her back. She states that she doesn't want a np she prefers Dr. Lorelei Pont. Explained to her that he was not taken new patients or TOC. Patient would not tell me why she needs a return call just kept telling me to have the nurse call her. EM

## 2020-10-16 NOTE — Telephone Encounter (Signed)
Needs appt

## 2020-10-16 NOTE — Telephone Encounter (Signed)
Patient states she needs a medication for a very bad UTI. She states she has not contacted her PCP, because she wants to make sure she is not given anything that is bad for her heart.

## 2020-10-16 NOTE — Telephone Encounter (Signed)
Spoke to patient she stated she has a bad UTI.Advised she needs to call PCP.

## 2020-10-16 NOTE — Telephone Encounter (Signed)
I spoke with pt; pt has burning and pain when urinates which started 10/14/20; pt does not have fever, frequency, urgency,back pain or abd pain.  Pt said the family that lives with her was dx with covid 2 wks ago and has been released off quarantine. Per pt she has no covid symptoms. Pt does not want to have any type of visit but does want a refill of abx given when had same symptoms 02/2020. I advised pt why needs appt and to have urine cked. Pt said she just wants abx sent to [place]. I advised pt if she did not want to come to office could do a video visit; pt said no she did not know how to do that. Pt did not want to schedule virtual phone visit and said she has no one to bring a urine specimen to office. Advised pt she would need to go to [facility] when she had transportation to go to [facility]. pt voiced understanding and said she just wants abx sent in. I advised again why needs urine cked. Pt said I can send the note to Avie Echevaria NP since Dr Lorelei Pont is not in office to see what she can do to help pt. Pt request cb when reviewed by Avie Echevaria NP.

## 2020-10-17 NOTE — Telephone Encounter (Signed)
Spoke to pt, phone visit scheduled with Dr Glori Bickers on Friday 2/11 and pt reports daughter will drop off urine sample in the AM prior to appt

## 2020-10-19 ENCOUNTER — Telehealth (INDEPENDENT_AMBULATORY_CARE_PROVIDER_SITE_OTHER): Payer: Medicare Other | Admitting: Family Medicine

## 2020-10-19 ENCOUNTER — Encounter: Payer: Self-pay | Admitting: Family Medicine

## 2020-10-19 ENCOUNTER — Other Ambulatory Visit: Payer: Self-pay

## 2020-10-19 VITALS — BP 120/81 | HR 106 | Wt 165.0 lb

## 2020-10-19 DIAGNOSIS — N3000 Acute cystitis without hematuria: Secondary | ICD-10-CM | POA: Diagnosis not present

## 2020-10-19 DIAGNOSIS — R300 Dysuria: Secondary | ICD-10-CM

## 2020-10-19 LAB — POC URINALSYSI DIPSTICK (AUTOMATED)
Bilirubin, UA: NEGATIVE
Blood, UA: 80
Glucose, UA: NEGATIVE
Ketones, UA: NEGATIVE
Nitrite, UA: NEGATIVE
Protein, UA: POSITIVE — AB
Spec Grav, UA: 1.025 (ref 1.010–1.025)
Urobilinogen, UA: 0.2 E.U./dL
pH, UA: 6.5 (ref 5.0–8.0)

## 2020-10-19 MED ORDER — OMEPRAZOLE 40 MG PO CPDR
40.0000 mg | DELAYED_RELEASE_CAPSULE | Freq: Every day | ORAL | 0 refills | Status: DC
Start: 2020-10-19 — End: 2020-12-04

## 2020-10-19 MED ORDER — SULFAMETHOXAZOLE-TRIMETHOPRIM 800-160 MG PO TABS: 1.0000 | ORAL_TABLET | Freq: Two times a day (BID) | ORAL | 0 refills | Status: DC

## 2020-10-19 NOTE — Patient Instructions (Signed)
Drink lots of fluids, especially water  We will call when urine culture returns next week  Take the bactrim ds as directed If any problems- let us know  If symptoms worsen please update [place] (if severe go to the ER) Watch for flank pain or fever or vomiting

## 2020-10-19 NOTE — Assessment & Plan Note (Signed)
Positive ua  cx pending  Px bactrim ds for 5 d (has used in the past) Encouraged a good fluid intake inst to call if symptoms worsen or change (watch for fever/ flank pain or n/v)  Pend cx will call to update and adj tx if needed

## 2020-10-19 NOTE — Progress Notes (Signed)
Virtual Visit via Telephone Note  I connected with Molly Chandler on 10/19/20 at 11:00 AM EST by telephone and verified that I am speaking with the correct person using two identifiers.  Location: Patient: home Provider: office   I discussed the limitations, risks, security and privacy concerns of performing an evaluation and management service by telephone and the availability of in person appointments. I also discussed with the patient that there may be a patient responsible charge related to this service. The patient expressed understanding and agreed to proceed.  Parties involved in encounter  Patient: Molly Chandler   Provider:  Loura Pardon MD   History of Present Illness: 86 yo pt of NP Molly Chandler presents with urinary symptoms   Pain/burning to urinate (worse when starting urination)  Bladder hurts also  No blood in urine  Frequency and some urgency  Some incontinence - this is normal for her  Urine smells a little strong  Some nausea  No fever   No flank pain    ua today Results for orders placed or performed in visit on 10/19/20  POCT Urinalysis Dipstick (Automated)  Result Value Ref Range   Color, UA Yellow    Clarity, UA Cloudy    Glucose, UA Negative Negative   Bilirubin, UA Negative    Ketones, UA Negative    Spec Grav, UA 1.025 1.010 - 1.025   Blood, UA 80 Ery/uL    pH, UA 6.5 5.0 - 8.0   Protein, UA Positive (A) Negative   Urobilinogen, UA 0.2 0.2 or 1.0 E.U./dL   Nitrite, UA Negative    Leukocytes, UA Large (3+) (A) Negative     last uti was in June  Drinking water and cranberry   Patient Active Problem List   Diagnosis Date Noted  . Acute cystitis 10/19/2020  . Chest pain with moderate risk for cardiac etiology 11/17/2019  . DOE (dyspnea on exertion) 11/17/2019  . Hyperlipidemia with target LDL less than 70 05/31/2019  . Episodic weakness 02/16/2019  . Hypotension 11/29/2018  . Cough 11/29/2018  . Fatigue 11/15/2018  . Decreased appetite  11/15/2018  . Coronary artery disease with hx of myocardial infarct w/o hx of CABG 11/12/2018  . History of ST elevation myocardial infarction (STEMI) 10/05/2018  . Acute ST elevation myocardial infarction (STEMI) involving left main coronary artery ([place]) 10/05/2018  . S/P CABG x 2 10/05/2018  . Osteoarthritis 02/05/2018  . Osteoporosis 02/05/2018  . GERD (gastroesophageal reflux disease) 09/26/2011  . Lymphoma ([place]) 05/30/2010   Past Medical History:  Diagnosis Date  . Cataract   . Coronary artery disease with hx of myocardial infarct w/o hx of CABG 10/05/2018   Presented as anterior STEMI --> Cath revealed 95% distal LM-ostial LAD thrombotic lesion -> initial EF 20 to 25% (LV Gram) significant reduced, IABP => to OR ~2 hours after stabilization (due to emergency OR team already working). -> s/p CABG x 2 (LIMA-LAD, SVG-OM) -> post MI echo EF 50 to 55%.  Marland Kitchen History of ST elevation myocardial infarction (STEMI) 10/05/2018   Denies ever having chest pain.  Had nausea vomiting diarrhea ongoing since March.  After significant diarrhea episode had near syncope followed by syncope --> EMS EKG showed anterior ST elevation  . Hx of CABG 10/06/2018   Two-vessel CABG:  LIMA-LAD, SVG-OM  . Hyperlipidemia with target LDL less than 70 05/31/2019  . Lymphoma ([place])    rectal cancer  . Osteoporosis    osteopenia   Past Surgical History:  Procedure Laterality Date  . CORONARY ARTERY BYPASS GRAFT N/A 10/05/2018   Procedure: CORONARY ARTERY BYPASS GRAFTING (CABG)x 2, LIMA-LAD, (right leg GSV) rSVG-OM (SVG harvest endoscopically);  Surgeon: Gaye Pollack, MD;  Location: [facility] OR;  Service: Cardiothoracic;  Laterality: N/A;  . CORONARY/GRAFT ACUTE MI REVASCULARIZATION N/A 10/05/2018   no PCI --> IABP --> EMERGENT CABG X 2  . IABP INSERTION N/A 10/05/2018   Procedure: IABP Insertion;  Surgeon: Leonie Man, MD;  Location: [facility] LAB;  Service: Cardiovascular;  Laterality: N/A;  . LEFT HEART CATH AND  CORONARY ANGIOGRAPHY N/A 10/05/2018   Procedure: LEFT HEART CATH AND CORONARY ANGIOGRAPHY;  Surgeon: Leonie Man, MD;  Location: [facility] INVASIVE CV LAB; STEMI/SYNCOPE:  Cath -> dLM-OstLAD 95% (thrombotic), ost Cx 50%. Initial EF 25-35% - anterior HK.  2+ MR. Elevated LVEDP --> IABP -> CABG  . NM MYOVIEW LTD  11/2019   INTERMEDIATE RISK.  Normal EF 55 to 65%.  Medium size moderate severity fixed defect in the mid anterolateral and apical lateral wall consistent with prior INFARCT.  NO EVIDENCE OF ISCHEMIA  . TRANSTHORACIC ECHOCARDIOGRAM  10/11/2018   Post CABG: EF 50-55%. Normal valves.  Abnormal Septal motion (normal post-op).  Normal atrial size.    Social History   Tobacco Use  . Smoking status: Former Research scientist (life sciences)  . Smokeless tobacco: Never Used  Substance Use Topics  . Alcohol use: No   History reviewed. No pertinent family history. Allergies  Allergen Reactions  . Amoxicillin Diarrhea and Nausea And Vomiting    GI intolerance.   . Metoclopramide Hcl Other (See Comments)    Stroke-like symptoms (dose too high?)   Current Outpatient Medications on File Prior to Visit  Medication Sig Dispense Refill  . acetaminophen (TYLENOL) 325 MG tablet Take 650 mg by mouth every 6 (six) hours as needed.    . Ascorbic Acid (VITAMIN C PO) Take 1 tablet by mouth.    Marland Kitchen atorvastatin (LIPITOR) 40 MG tablet Take 1 tablet (40 mg total) by mouth daily at 6 PM. 90 tablet 3  . Cholecalciferol (VITAMIN D PO) Take 1 tablet by mouth daily.     . clopidogrel (PLAVIX) 75 MG tablet TAKE 1 TABLET BY MOUTH  DAILY 90 tablet 3  . ELDERBERRY PO Take by mouth. Mag, zinc, immune system support    . latanoprost (XALATAN) 0.005 % ophthalmic solution Place 1 drop into both eyes daily.    . Melatonin 10 MG TABS Take 10 mg by mouth as needed.    . Multiple Vitamin (MULTIVITAMIN) tablet Take 1 tablet by mouth daily.    . Omega-3 Fatty Acids (FISH OIL PO) Take by mouth daily.    Marland Kitchen omeprazole (PRILOSEC) 40 MG capsule Take 1  capsule (40 mg total) by mouth daily. 90 capsule 3  . Probiotic Product (PROBIOTIC DAILY PO) Take 1 tablet by mouth daily.      No current facility-administered medications on file prior to visit.   Review of Systems  Constitutional: Negative for chills, fever and malaise/fatigue.  HENT: Negative for congestion, ear pain, sinus pain and sore throat.   Eyes: Negative for blurred vision, discharge and redness.  Respiratory: Negative for cough, shortness of breath and stridor.   Cardiovascular: Negative for chest pain, palpitations and leg swelling.  Gastrointestinal: Positive for nausea. Negative for abdominal pain, diarrhea and vomiting.  Genitourinary: Positive for dysuria, frequency and urgency. Negative for flank pain and hematuria.  Musculoskeletal: Negative for myalgias.  Skin: Negative for  rash.  Neurological: Negative for dizziness and headaches.    Observations/Objective: Pt sounds well Not distressed  Nl cognition- good historian  Able to hear call sufficiently  Nl mood/ pleasant   Assessment and Plan: Problem List Items Addressed This Visit      Genitourinary   Acute cystitis - Primary    Positive ua  cx pending  Px bactrim ds for 5 d (has used in the past) Encouraged a good fluid intake inst to call if symptoms worsen or change (watch for fever/ flank pain or n/v)  Pend cx will call to update and adj tx if needed      Relevant Orders   Urine Culture    Other Visit Diagnoses    Dysuria       Relevant Orders   POCT Urinalysis Dipstick (Automated) (Completed)       Follow Up Instructions: Drink lots of fluids, especially water  We will call when urine culture returns next week  Take the bactrim ds as directed If any problems- let us know  If symptoms worsen please update [place] (if severe go to the ER) Watch for flank pain or fever or vomiting   I discussed the assessment and treatment plan with the patient. The patient was provided an opportunity to ask  questions and all were answered. The patient agreed with the plan and demonstrated an understanding of the instructions.   The patient was advised to call back or seek an in-person evaluation if the symptoms worsen or if the condition fails to improve as anticipated.  I provided 16 minutes of non-face-to-face time during this encounter.   Loura Pardon, MD

## 2020-10-21 LAB — URINE CULTURE
MICRO NUMBER:: 11524829
SPECIMEN QUALITY:: ADEQUATE

## 2020-10-22 ENCOUNTER — Telehealth: Payer: Self-pay

## 2020-10-22 MED ORDER — CEPHALEXIN 500 MG PO CAPS: 500.0000 mg | ORAL_CAPSULE | Freq: Two times a day (BID) | ORAL | 0 refills | Status: AC

## 2020-10-22 NOTE — Telephone Encounter (Signed)
See Result Notes.  

## 2020-10-29 ENCOUNTER — Telehealth: Payer: Self-pay

## 2020-10-29 NOTE — Telephone Encounter (Signed)
I spoke with pt; pt said dropped lid on rt foot 1 month ago with no pain but bruised leg. pts right leg and foot is swollen and red and pain level now 8. ; pt is presently living at [place](about 30 miles from her home) to help take care of her son. Pt said she does not have CP or SOB and does not want to go to ED and sit for hours. Pt wants to know how much she would have to pay out of pocket today at [facility] on [address] to be seen I called [facility] on [address] choosing option 1 x 2 with no answer and cut off after multiple ringings. I gave pt [phone] to call UC at [facility] to get answer to her question. I did advise pt she should be seen today either at an UC or Ed and pt voiced understanding. Sending note to Avie Echevaria NP who is out of office and Dr Silvio Pate who is in office and [facility] CMA.

## 2020-10-29 NOTE — Telephone Encounter (Signed)
[facility] - Client TELEPHONE ADVICE RECORD [facility] Patient Name: Molly Chandler Gender: Female DOB: 11-02-1934 Age: 86 Y 71 M 28 D Return Phone Number: [phone] (Primary) Address: City/State/Zip: [place] [address] Client [facility] - Client Client Site [place] - Day Physician Webb Silversmith - NP Contact Type Call Who Is Calling Patient / Member / Family / Caregiver Call Type Triage / Clinical Relationship To Patient Self Return Phone Number [phone] (Primary) Chief Complaint Skin Lesion - Moles/ Lumps/ Growths Reason for Call Symptomatic / Request for Keeler states she has a bump inside of her right foot that is purple. Translation No Nurse Assessment Nurse: Ysidro Evert, RN, Levada Dy Date/Time Eilene Ghazi Time): 10/29/2020 12:48:57 PM Confirm and document reason for call. If symptomatic, describe symptoms. ---Caller states she has a bruise on her leg and ankle from a pot lid that fell on it and it is swelling Does the patient have any new or worsening symptoms? ---Yes Will a triage be completed? ---Yes Related visit to physician within the last 2 weeks? ---No Does the PT have any chronic conditions? (i.e. diabetes, asthma, this includes High risk factors for pregnancy, etc.) ---No Is this a behavioral health or substance abuse call? ---No Guidelines Guideline Title Affirmed Question Affirmed Notes Nurse Date/Time (Eastern Time) Bruises [1] Raised bruise AND [2] size > 2 inches (5 cm) AND [3] getting bigger Ysidro Evert, RN, Levada Dy 10/29/2020 12:51:46 PM Disp. Time Eilene Ghazi Time) Disposition Final User 10/29/2020 12:55:13 PM See HCP within 4 Hours (or PCP triage) Yes Ysidro Evert, RN, Marin Shutter Disagree/Comply Comply Caller Understands Yes PreDisposition Did not know what to do PLEASE NOTE: All timestamps contained within this report are represented as  [place] Standard Time. CONFIDENTIALTY NOTICE: This fax transmission is intended only for the addressee. It contains information that is legally privileged, confidential or otherwise protected from use or disclosure. If you are not the intended recipient, you are strictly prohibited from reviewing, disclosing, copying using or disseminating any of this information or taking any action in reliance on or regarding this information. If you have received this fax in error, please notify us immediately by telephone so that we can arrange for its return to [place]. Phone: [phone], Toll-Free: [phone], Fax: [phone] Page: 2 of 2 Call Id: [id number] Care Advice Given Per Guideline SEE HCP (OR PCP TRIAGE) WITHIN 4 HOURS: CALL BACK IF: * You become worse CARE ADVICE given per [place] (Adult) guideline. Referrals REFERRED TO PCP OFFICE

## 2020-10-29 NOTE — Telephone Encounter (Signed)
Agree with UC eval. I am in the ER until 4 at [facility], if she wants to come here I will evaluate her.

## 2020-10-29 NOTE — Telephone Encounter (Signed)
Please check on her tomorrow 

## 2020-10-29 NOTE — Telephone Encounter (Signed)
Pt notified and voiced understanding. Pt said going to [facility] ED is too far a drive with the cost of gas and she does not have money and has ice on the area and will see what she is going to do. I explained the UC & ED precautions again and that we are concerned about her well being. Pt voiced understanding and said she will call EMTs out to look at her leg and she will do what they say. Sending note to Avie Echevaria NP as Juluis Rainier.

## 2020-11-01 NOTE — Telephone Encounter (Signed)
Pt reports her Sx have improved and she no longer has any concerns... pt advised if anything should take a turn back and Sx return and/or worsened, she definitely need to be evaluated in person

## 2020-11-06 ENCOUNTER — Ambulatory Visit: Payer: Medicare Other | Admitting: Cardiology

## 2020-11-06 ENCOUNTER — Telehealth: Payer: Self-pay | Admitting: Cardiology

## 2020-11-06 NOTE — Telephone Encounter (Signed)
I think she is probably okay just stopping the clopidogrel.  If the swelling goes down with foot elevation, I would prefer to use that technique as opposed to using Lasix, however given becomes worse, would do Lasix 20 mg p.o. PRN edema.  Glenetta Hew, MD

## 2020-11-06 NOTE — Telephone Encounter (Signed)
Spoke with patient. Patient has edema in her legs from her ankle down. She reports when she elevates her legs the swelling goes down. She also bruises very easily. Patient does not weigh daily but her weight this morning was 166.6lbs. BP 140/69, HR 77. Patient is monitoring her salt intake. She is unable to wear compression stockings because they hurt her legs. Explained the purpose of compression stockings. She has no shortness of breath or other symptoms.   Advised patient to weigh daily at the same time in the same way. Call back if she gains 3lbs overnight or 5lbs in a week. Advised patient to continue to elevate her legs when resting and to monitor salt intake. Advised to wear compression stockings.

## 2020-11-06 NOTE — Telephone Encounter (Signed)
°  Pt c/o swelling: STAT is pt has developed SOB within 24 hours  1) How much weight have you gained and in what time span? Not sure  2) If swelling, where is the swelling located? Legs and she bruise very bad  3) Are you currently taking a fluid pill? No   4) Are you currently SOB? No   5) Do you have a log of your daily weights (if so, list)? No   6) Have you gained 3 pounds in a day or 5 pounds in a week? Not sure  7) Have you traveled recently? No

## 2020-11-08 NOTE — Telephone Encounter (Signed)
Spoke with patient and relayed instructions / recommendations from Dr. Ellyn Hack. Plavix removed from medication list. Patient verbalized understanding.

## 2020-11-12 ENCOUNTER — Encounter: Payer: Self-pay | Admitting: Internal Medicine

## 2020-11-12 ENCOUNTER — Telehealth: Payer: Self-pay

## 2020-11-12 ENCOUNTER — Ambulatory Visit (INDEPENDENT_AMBULATORY_CARE_PROVIDER_SITE_OTHER): Payer: Medicare Other | Admitting: Internal Medicine

## 2020-11-12 ENCOUNTER — Other Ambulatory Visit: Payer: Self-pay

## 2020-11-12 VITALS — BP 130/84 | HR 68 | Temp 96.5°F | Wt 167.0 lb

## 2020-11-12 DIAGNOSIS — R202 Paresthesia of skin: Secondary | ICD-10-CM | POA: Diagnosis not present

## 2020-11-12 DIAGNOSIS — R238 Other skin changes: Secondary | ICD-10-CM | POA: Diagnosis not present

## 2020-11-12 DIAGNOSIS — M7989 Other specified soft tissue disorders: Secondary | ICD-10-CM | POA: Diagnosis not present

## 2020-11-12 DIAGNOSIS — R233 Spontaneous ecchymoses: Secondary | ICD-10-CM

## 2020-11-12 NOTE — Telephone Encounter (Signed)
Patient was in office today. After leaving to schedule her follow up appointment let patient know that her provider was leaving the office and that she was able to either travel with her or I could give her names of there providers accepting new patients.  Patient let me know she has transportation issues as well as with her age she is not able to travel far. Wanting to know if anyone is willing to establish with this patient considering her transportation issues     Please advise

## 2020-11-12 NOTE — Progress Notes (Signed)
Subjective:    Patient ID: Molly Chandler, female    DOB: 29-Aug-1935, 86 y.o.   MRN: [id number]  HPI  Pt presents to the clinic today with c/o pain in her lower legs.  She reports this started 1 to 2 weeks ago.  She describes the pain as sore and achy with numbness, tingling and burning sensation of the bottoms of her feet.  She has noticed some slight swelling but denies redness, warmth or weakness.  She denies any chronic low back pain.  She is concerned that she may have neuropathy and she would like to be checked for this.  She has no history of diabetes.  She also reports easy bruising.  She is not taking any anticoagulants.  She has no history of liver disease.  Review of Systems  Past Medical History:  Diagnosis Date  . Cataract   . Coronary artery disease with hx of myocardial infarct w/o hx of CABG 10/05/2018   Presented as anterior STEMI --> Cath revealed 95% distal LM-ostial LAD thrombotic lesion -> initial EF 20 to 25% (LV Gram) significant reduced, IABP => to OR ~2 hours after stabilization (due to emergency OR team already working). -> s/p CABG x 2 (LIMA-LAD, SVG-OM) -> post MI echo EF 50 to 55%.  Marland Kitchen History of ST elevation myocardial infarction (STEMI) 10/05/2018   Denies ever having chest pain.  Had nausea vomiting diarrhea ongoing since March.  After significant diarrhea episode had near syncope followed by syncope --> EMS EKG showed anterior ST elevation  . Hx of CABG 10/06/2018   Two-vessel CABG:  LIMA-LAD, SVG-OM  . Hyperlipidemia with target LDL less than 70 05/31/2019  . Lymphoma ([facility])    rectal cancer  . Osteoporosis    osteopenia    Current Outpatient Medications  Medication Sig Dispense Refill  . acetaminophen (TYLENOL) 325 MG tablet Take 650 mg by mouth every 6 (six) hours as needed.    . Ascorbic Acid (VITAMIN C PO) Take 1 tablet by mouth.    Marland Kitchen atorvastatin (LIPITOR) 40 MG tablet Take 1 tablet (40 mg total) by mouth daily at 6 PM. 90 tablet 3  .  Cholecalciferol (VITAMIN D PO) Take 1 tablet by mouth daily.     Marland Kitchen ELDERBERRY PO Take by mouth. Mag, zinc, immune system support    . latanoprost (XALATAN) 0.005 % ophthalmic solution Place 1 drop into both eyes daily.    . Melatonin 10 MG TABS Take 10 mg by mouth as needed.    . Multiple Vitamin (MULTIVITAMIN) tablet Take 1 tablet by mouth daily.    . Omega-3 Fatty Acids (FISH OIL PO) Take by mouth daily.    Marland Kitchen omeprazole (PRILOSEC) 40 MG capsule Take 1 capsule (40 mg total) by mouth daily. 30 capsule 0  . Probiotic Product (PROBIOTIC DAILY PO) Take 1 tablet by mouth daily.      No current facility-administered medications for this visit.    Allergies  Allergen Reactions  . Amoxicillin Diarrhea and Nausea And Vomiting    GI intolerance.   . Metoclopramide Hcl Other (See Comments)    Stroke-like symptoms (dose too high?)    No family history on file.  Social History   Socioeconomic History  . Marital status: Married    Spouse name: Not on file  . Number of children: 5  . Years of education: Not on file  . Highest education level: Not on file  Occupational History  . Occupation: Retired  Employer: RETIRED  Tobacco Use  . Smoking status: Former Games developer  . Smokeless tobacco: Never Used  Substance and Sexual Activity  . Alcohol use: No  . Drug use: Not on file  . Sexual activity: Not on file  Other Topics Concern  . Not on file  Social History Narrative   Desires CPR   Would not want life support prolonged if futile   Social Determinants of Health   Financial Resource Strain: Not on file  Food Insecurity: Not on file  Transportation Needs: Not on file  Physical Activity: Not on file  Stress: Not on file  Social Connections: Not on file  Intimate Partner Violence: Not on file     Constitutional: Denies fever, malaise, fatigue, headache or abrupt weight changes. Skin: Patient reports easy bruising.  Respiratory: Denies difficulty breathing, shortness of breath,  cough or sputum production.   Cardiovascular: Patient reports swelling of BLE.  Denies chest pain, chest tightness, palpitations or swelling in the hands.  Musculoskeletal: Patient reports bilateral lower leg pain.  Denies decrease in range of motion, difficulty with gait, muscle pain or joint pain.  Skin: Denies redness, rashes, lesions or ulcercations.  Neurological: Patient reports numbness, tingling and burning sensation of feet.  Denies dizziness, difficulty with memory, difficulty with speech or problems with balance and coordination.    No other specific complaints in a complete review of systems (except as listed in HPI above).     Objective:   Physical Exam BP 130/84   Pulse 68   Temp (!) 96.5 F (35.8 C) (Temporal)   Wt 167 lb (75.8 kg)   SpO2 97%   BMI 33.73 kg/m   Wt Readings from Last 3 Encounters:  10/19/20 165 lb (74.8 kg)  07/11/20 165 lb 3.2 oz (74.9 kg)  05/10/20 164 lb (74.4 kg)    General: Appears her stated age, well developed, well nourished in NAD. Skin: Warm, dry and intact.  Scattered bruises noted on BLE. HEENT: Head: normal shape and size; Eyes: sclera white, no icterus, conjunctiva pink, PERRLA and EOMs intact;  Cardiovascular: Normal rate and rhythm. S1,S2 noted.  No murmur, rubs or gallops noted.  1+ BLE edema.  Pedal pulses 2+ bilaterally Pulmonary/Chest: Normal effort and positive vesicular breath sounds. No respiratory distress. No wheezes, rales or ronchi noted.  Musculoskeletal: Normal flexion and extension of the knees.  Normal flexion, extension and rotation of the ankles.  Gait slow and steady with use of cane. Neurological: Alert and oriented.  Sensation intact but hypersensitive to BLE.  BMET    Component Value Date/Time   NA 139 07/11/2020 1502   NA 138 08/05/2014 1652   K 4.8 07/11/2020 1502   K 3.7 08/05/2014 1652   CL 100 07/11/2020 1502   CL 104 08/05/2014 1652   CO2 26 07/11/2020 1502   CO2 29 08/05/2014 1652   GLUCOSE 92  07/11/2020 1502   GLUCOSE 105 (H) 11/15/2018 1346   GLUCOSE 89 08/05/2014 1652   BUN 19 07/11/2020 1502   BUN 19 (H) 08/05/2014 1652   CREATININE 0.74 07/11/2020 1502   CREATININE 0.72 08/05/2014 1652   CREATININE 0.66 08/01/2011 1536   CALCIUM 10.0 07/11/2020 1502   CALCIUM 9.1 08/05/2014 1652   GFRNONAA 75 07/11/2020 1502   GFRNONAA >60 08/05/2014 1652   GFRAA 87 07/11/2020 1502   GFRAA >60 08/05/2014 1652    Lipid Panel     Component Value Date/Time   CHOL 100 11/24/2019 0833   TRIG 47  11/24/2019 0833   HDL 41 11/24/2019 0833   CHOLHDL 2.4 11/24/2019 0833   CHOLHDL 3.8 10/05/2018 0708   VLDL 4 10/05/2018 0708   LDLCALC 47 11/24/2019 0833    CBC    Component Value Date/Time   WBC 6.5 07/11/2020 1502   WBC 5.5 11/15/2018 1346   RBC 4.12 07/11/2020 1502   RBC 4.25 11/15/2018 1346   HGB 13.1 07/11/2020 1502   HGB 13.3 10/21/2010 0856   HGB 12.6 12/23/2007 0811   HCT 39.1 07/11/2020 1502   HCT 40.0 10/21/2010 0856   HCT 36.0 12/23/2007 0811   PLT 233 07/11/2020 1502   MCV 95 07/11/2020 1502   MCV 95 08/05/2014 1652   MCV 93 10/21/2010 0856   MCV 90.5 12/23/2007 0811   MCH 31.8 07/11/2020 1502   MCH 30.4 10/12/2018 0324   MCHC 33.5 07/11/2020 1502   MCHC 33.2 11/15/2018 1346   RDW 11.6 (L) 07/11/2020 1502   RDW 13.7 08/05/2014 1652   RDW 11.9 10/21/2010 0856   RDW 13.4 12/23/2007 0811   LYMPHSABS 1.3 11/15/2018 1346   LYMPHSABS 2.1 08/05/2014 1652   LYMPHSABS 1.1 10/21/2010 0856   LYMPHSABS 1.1 12/23/2007 0811   MONOABS 0.4 11/15/2018 1346   MONOABS 0.5 08/05/2014 1652   MONOABS 0.4 12/23/2007 0811   EOSABS 0.4 11/15/2018 1346   EOSABS 0.1 08/05/2014 1652   EOSABS 0.1 10/21/2010 0856   BASOSABS 0.1 11/15/2018 1346   BASOSABS 0.1 08/05/2014 1652   BASOSABS 0.0 10/21/2010 0856   BASOSABS 0.0 12/23/2007 0811    Hgb A1C Lab Results  Component Value Date   HGBA1C 5.3 10/05/2018             Assessment & Plan:  Paresthesia of BLE, Leg  Swelling:  We will check c-Met, TSH, vitamin D, B12 and A1c today Consider Gabapentin 100 mg at bedtime pending lab results  Easy Bruising:  CBC and c-Met today  We will follow-up after labs, return precautions discussed Nicki Reaper, NP This visit occurred during the SARS-CoV-2 public health emergency.  Safety protocols were in place, including screening questions prior to the visit, additional usage of staff PPE, and extensive cleaning of exam room while observing appropriate contact time as indicated for disinfecting solutions.

## 2020-11-12 NOTE — Patient Instructions (Signed)
Bradley and Daroff's neurology in clinical practice (8th ed., pp. 1853- 1929). Elsevier."> Goldman-Cecil medicine (26th ed., pp. 2489- 2501). Elsevier.">  Peripheral Neuropathy Peripheral neuropathy is a type of nerve damage. It affects nerves that carry signals between the spinal cord and the arms, legs, and the rest of the body (peripheral nerves). It does not affect nerves in the spinal cord or brain. In peripheral neuropathy, one nerve or a group of nerves may be damaged. Peripheral neuropathy is a broad category that includes many specific nerve disorders, like diabetic neuropathy, hereditary neuropathy, and carpal tunnel syndrome. What are the causes? This condition may be caused by:  Diabetes. This is the most common cause of peripheral neuropathy.  Nerve injury.  Pressure or stress on a nerve that lasts a long time.  Lack (deficiency) of B vitamins. This can result from alcoholism, poor diet, or a restricted diet.  Infections.  Autoimmune diseases, such as rheumatoid arthritis and systemic lupus erythematosus.  Nerve diseases that are passed from parent to child (inherited).  Some medicines, such as cancer medicines (chemotherapy).  Poisonous (toxic) substances, such as lead and mercury.  Too little blood flowing to the legs.  Kidney disease.  Thyroid disease. In some cases, the cause of this condition is not known. What are the signs or symptoms? Symptoms of this condition depend on which of your nerves is damaged. Common symptoms include:  Loss of feeling (numbness) in the feet, hands, or both.  Tingling in the feet, hands, or both.  Burning pain.  Very sensitive skin.  Weakness.  Not being able to move a part of the body (paralysis).  Muscle twitching.  Clumsiness or poor coordination.  Loss of balance.  Not being able to control your bladder.  Feeling dizzy.  Sexual problems. How is this diagnosed? Diagnosing and finding the cause of peripheral  neuropathy can be difficult. Your health care provider will take your medical history and do a physical exam. A neurological exam will also be done. This involves checking things that are affected by your brain, spinal cord, and nerves (nervous system). For example, your health care provider will check your reflexes, how you move, and what you can feel. You may have other tests, such as:  Blood tests.  Electromyogram (EMG) and nerve conduction tests. These tests check nerve function and how well the nerves are controlling the muscles.  Imaging tests, such as CT scans or MRI to rule out other causes of your symptoms.  Removing a small piece of nerve to be examined in a lab (nerve biopsy).  Removing and examining a small amount of the fluid that surrounds the brain and spinal cord (lumbar puncture). How is this treated? Treatment for this condition may involve:  Treating the underlying cause of the neuropathy, such as diabetes, kidney disease, or vitamin deficiencies.  Stopping medicines that can cause neuropathy, such as chemotherapy.  Medicine to help relieve pain. Medicines may include: ? Prescription or over-the-counter pain medicine. ? Antiseizure medicine. ? Antidepressants. ? Pain-relieving patches that are applied to painful areas of skin.  Surgery to relieve pressure on a nerve or to destroy a nerve that is causing pain.  Physical therapy to help improve movement and balance.  Devices to help you move around (assistive devices). Follow these instructions at home: Medicines  Take over-the-counter and prescription medicines only as told by your health care provider. Do not take any other medicines without first asking your health care provider.  Do not drive or use heavy   machinery while taking prescription pain medicine. Lifestyle  Do not use any products that contain nicotine or tobacco, such as cigarettes and e-cigarettes. Smoking keeps blood from reaching damaged nerves.  If you need help quitting, ask your health care provider.  Avoid or limit alcohol. Too much alcohol can cause a vitamin B deficiency, and vitamin B is needed for healthy nerves.  Eat a healthy diet. This includes: ? Eating foods that are high in fiber, such as fresh fruits and vegetables, whole grains, and beans. ? Limiting foods that are high in fat and processed sugars, such as fried or sweet foods.   General instructions  If you have diabetes, work closely with your health care provider to keep your blood sugar under control.  If you have numbness in your feet: ? Check every day for signs of injury or infection. Watch for redness, warmth, and swelling. ? Wear padded socks and comfortable shoes. These help protect your feet.  Develop a good support system. Living with peripheral neuropathy can be stressful. Consider talking with a mental health specialist or joining a support group.  Use assistive devices and attend physical therapy as told by your health care provider. This may include using a walker or a cane.  Keep all follow-up visits as told by your health care provider. This is important.   Contact a health care provider if:  You have new signs or symptoms of peripheral neuropathy.  You are struggling emotionally from dealing with peripheral neuropathy.  Your pain is not well-controlled. Get help right away if:  You have an injury or infection that is not healing normally.  You develop new weakness in an arm or leg.  You have fallen or do so frequently. Summary  Peripheral neuropathy is when the nerves in the arms, or legs are damaged, resulting in numbness, weakness, or pain.  There are many causes of peripheral neuropathy, including diabetes, pinched nerves, vitamin deficiencies, autoimmune disease, and hereditary conditions.  Diagnosing and finding the cause of peripheral neuropathy can be difficult. Your health care provider will take your medical history, do a  physical exam, and do tests, including blood tests and nerve function tests.  Treatment involves treating the underlying cause of the neuropathy and taking medicines to help control pain. Physical therapy and assistive devices may also help. This information is not intended to replace advice given to you by your health care provider. Make sure you discuss any questions you have with your health care provider. Document Revised: 06/05/2020 Document Reviewed: 06/05/2020 Elsevier Patient Education  2021 Else[employer] Inc[employer]  

## 2020-11-13 ENCOUNTER — Encounter: Payer: Self-pay | Admitting: Internal Medicine

## 2020-11-13 ENCOUNTER — Telehealth: Payer: Self-pay | Admitting: Cardiology

## 2020-11-13 LAB — COMPREHENSIVE METABOLIC PANEL
ALT: 12 U/L (ref 0–35)
AST: 17 U/L (ref 0–37)
Albumin: 3.9 g/dL (ref 3.5–5.2)
Alkaline Phosphatase: 55 U/L (ref 39–117)
BUN: 17 mg/dL (ref 6–23)
CO2: 29 mEq/L (ref 19–32)
Calcium: 9.3 mg/dL (ref 8.4–10.5)
Chloride: 106 mEq/L (ref 96–112)
Creatinine, Ser: 0.69 mg/dL (ref 0.40–1.20)
GFR: 80.05 mL/min (ref >60.00)
Glucose, Bld: 93 mg/dL (ref 70–99)
Potassium: 4.6 mEq/L (ref 3.5–5.1)
Sodium: 141 mEq/L (ref 135–145)
Total Bilirubin: 0.4 mg/dL (ref 0.2–1.2)
Total Protein: 6 g/dL (ref 6.0–8.3)

## 2020-11-13 LAB — TSH: TSH: 1.4 u[IU]/mL (ref 0.35–4.50)

## 2020-11-13 LAB — CBC
HCT: 34.3 % — ABNORMAL LOW (ref 36.0–46.0)
Hemoglobin: 11.5 g/dL — ABNORMAL LOW (ref 12.0–15.0)
MCHC: 33.6 g/dL (ref 30.0–36.0)
MCV: 96 fl (ref 78.0–100.0)
Platelets: 204 10*3/uL (ref 150.0–400.0)
RBC: 3.57 Mil/uL — ABNORMAL LOW (ref 3.87–5.11)
RDW: 14.3 % (ref 11.5–15.5)
WBC: 6.2 10*3/uL (ref 4.0–10.5)

## 2020-11-13 LAB — VITAMIN D 25 HYDROXY (VIT D DEFICIENCY, FRACTURES): VITD: 35.25 ng/mL (ref 30.00–100.00)

## 2020-11-13 LAB — HEMOGLOBIN A1C: Hgb A1c MFr Bld: 5.5 % (ref 4.6–6.5)

## 2020-11-13 LAB — VITAMIN B12: Vitamin B-12: 247 pg/mL (ref 211–911)

## 2020-11-13 MED ORDER — FUROSEMIDE 20 MG PO TABS: 20.0000 mg | ORAL_TABLET | ORAL | 3 refills | Status: DC | PRN

## 2020-11-13 NOTE — Telephone Encounter (Signed)
Absolutely, no problem.

## 2020-11-13 NOTE — Telephone Encounter (Signed)
Pt c/o swelling: STAT is pt has developed SOB within 24 hours  1) How much weight have you gained and in what time span? No noticeable weight gain  2) If swelling, where is the swelling located? Legs  3) Are you currently taking a fluid pill? No   4) Are you currently SOB? No   5) Do you have a log of your daily weights (if so, list)?  03/08: 166.4 lbs Patient states her weight remains relatively consistent, but she does not have any additional readings  6) Have you gained 3 pounds in a day or 5 pounds in a week? No   7) Have you traveled recently? No   Patient is also requesting a recommendation for a new PCP.  She states yesterday she went to see her PCP and she found out she is leaving the practice. She would like to have Dr. Ellyn Hack refer her to someone new.

## 2020-11-13 NOTE — Telephone Encounter (Signed)
Pt continues to note swelling " legs hurt" Per pt can't sit all the time with legs elevated Encouraged to wear compression stockings and watch salt intake Per pt will try and wear stockings and Furosemide 20 mg prn called in as directed from previous phone note .Pt also instructed to keep phone visit on 3/11 ./cy

## 2020-11-13 NOTE — Telephone Encounter (Signed)
This is a lady who has been with our office for 10+ years and is scared to leave our practice.  She was with Dr. Deborra Medina prior to Roll[facility] Fare and is very worried about transportation as she lives very close to our office and is comfortable with [place].    Given that this is a long standing, committed patient to our practice with special transportation needs, if we do not have capacity to take her on at this point,  would you at least be comfortable handling any acute care needs until a new provider is hired and can take her on? Appears she is following cardiology very closely and medications are handled by their office for the most part.

## 2020-11-13 NOTE — Telephone Encounter (Signed)
Happy to see for acute care as needed.

## 2020-11-14 NOTE — Telephone Encounter (Signed)
Molly Chandler,  Can you please call patient and let her know that we will continue to see her for any acute needs until we get a new provider to take her on as a transfer.    Thank you!

## 2020-11-14 NOTE — Telephone Encounter (Signed)
She is scheduled forv irtual visit on Friday.

## 2020-11-15 ENCOUNTER — Telehealth: Payer: Self-pay | Admitting: Internal Medicine

## 2020-11-15 NOTE — Telephone Encounter (Signed)
Pt called in wanted to know about getting her lab results and wanted to know if a copy could be sent to her heart doctor, Dr. Ellyn Hack

## 2020-11-16 ENCOUNTER — Encounter: Payer: Self-pay | Admitting: Cardiology

## 2020-11-16 ENCOUNTER — Telehealth (INDEPENDENT_AMBULATORY_CARE_PROVIDER_SITE_OTHER): Payer: Medicare Other | Admitting: Cardiology

## 2020-11-16 DIAGNOSIS — M7989 Other specified soft tissue disorders: Secondary | ICD-10-CM

## 2020-11-16 DIAGNOSIS — I2510 Atherosclerotic heart disease of native coronary artery without angina pectoris: Secondary | ICD-10-CM

## 2020-11-16 DIAGNOSIS — I252 Old myocardial infarction: Secondary | ICD-10-CM | POA: Diagnosis not present

## 2020-11-16 DIAGNOSIS — E785 Hyperlipidemia, unspecified: Secondary | ICD-10-CM | POA: Diagnosis not present

## 2020-11-16 HISTORY — DX: Other specified soft tissue disorders: M79.89

## 2020-11-16 NOTE — Assessment & Plan Note (Signed)
I think now that her blood pressures are starting to drift up, she may be having a little diastolic dysfunction, however she is not having the other CHF symptoms.  I think we can probably back off on some of the free salt loading.  He is no longer requiring midodrine, but I still would prefer to avoid antihypertensives.  Since she is feeling better with taking daily Lasix, my recommendation would be to essentially make it daily during weekdays and hold during weekends or unless she is traveling.  If she gains more than 3 pounds she will take additional dose.

## 2020-11-16 NOTE — Assessment & Plan Note (Signed)
Labs were checked a year ago with a PCPs office.  Unfortunately she is in the process of having to find a new PCP since her current PCP is moving.  She is due to get lipids checked we will have these checked within a month.  Continue current dose of statin.

## 2020-11-16 NOTE — Assessment & Plan Note (Signed)
She is still doing remarkably well.  She recovered from her CABG and has been doing well since.  Nonischemic Myoview and follow-up.  Notably improved EF on both Myoview and echocardiogram.  Now that her blood pressure is a findings are normalized, she is feeling overall better and stronger.  I still think with her just having been weaned off of midodrine that is probably too soon to consider the consideration of either ARB or beta-blocker.  We stopped Plavix because of all of her   She remains on statin, PRN Lasix and omeprazole.

## 2020-11-16 NOTE — Assessment & Plan Note (Signed)
She presented with acute MI, and went for emergent CABG from the Cath Lab table to the OR upon presentation.  She has had remarkable recovery and function following CABG with EF back up to 50 to 55%.  Nonischemic Myoview.  No anginal symptoms.  Only minimal edema as possible "CHF symptoms.

## 2020-11-16 NOTE — Patient Instructions (Signed)
Medication Instructions:   Without new fluid pill.  Maybe take it on Monday through Friday and then take a break on Saturday and Sunday.  You do not have to take it every day, specifically me about doing things.  *If you need a refill on your cardiac medications before your next appointment, please call your pharmacy*   Lab Work: Unfortunately, your cholesterol levels were not checked with your regular labs.  We will put in the lab slip for your cholesterol be drawn.  When you come to get your injections for your back, you can get your blood work checked.  Make sure that you are fasting.  If you have labs (blood work) drawn today and your tests are completely normal, you will receive your results only by: Marland Kitchen MyChart Message (if you have MyChart) OR . A paper copy in the mail If you have any lab test that is abnormal or we need to change your treatment, we will call you to review the results.   Testing/Procedures: No testing for now.   Follow-Up: At [facility], you and your health needs are our priority.  As part of our continuing mission to provide you with exceptional heart care, we have created designated Provider Care Teams.  These Care Teams include your primary Cardiologist (physician) and Advanced Practice Providers (APPs -  Physician Assistants and Nurse Practitioners) who all work together to provide you with the care you need, when you need it.  We recommend signing up for the patient portal called "MyChart".  Sign up information is provided on this After Visit Summary.  MyChart is used to connect with patients for Virtual Visits (Telemedicine).  Patients are able to view lab/test results, encounter notes, upcoming appointments, etc.  Non-urgent messages can be sent to your provider as well.   To learn more about what you can do with MyChart, go to [email or web address].    Your next appointment:   4 month(s)  The format for your next appointment:   In Person  Provider:    Glenetta Hew, MD   Other Instructions Keep staying active.  Make sure you stay hydrated. Also elevate those feet whenever you can-it will help the swelling.

## 2020-11-16 NOTE — Progress Notes (Signed)
Virtual Visit via Telephone Note   This visit type was conducted due to national recommendations for restrictions regarding the COVID-19 Pandemic (e.g. social distancing) in an effort to limit this patient's exposure and mitigate transmission in our community.  Due to her co-morbid illnesses, this patient is at least at moderate risk for complications without adequate follow up.  This format is felt to be most appropriate for this patient at this time.  The patient did not have access to video technology/had technical difficulties with video requiring transitioning to audio format only (telephone).  All issues noted in this document were discussed and addressed.  No physical exam could be performed with this format.  Please refer to the patient's chart for her  consent to telehealth for [facility].   Patient has given verbal permission to conduct this visit via virtual appointment and to bill insurance 11/16/2020 3:03 PM     Evaluation Performed:  Follow-up visit  Date:  11/16/2020   ID:  Molly Chandler, DOB [birth year], MRN [id number]  Patient Location: Home Provider Location: Home Office  PCP:  Lorre Munroe, NP  Cardiologist:  Bryan Lemma, MD  Electrophysiologist:  None   Chief Complaint:   Chief Complaint  Patient presents with  . Leg Swelling    Called in on March 1 with significant lower extremity swelling and discomfort.  Was given prescription for Lasix 20 mg tablets. ->  Feeling much better.  . Coronary Artery Disease    No angina.  . Hypotension    No longer requiring midodrine    ====================================  ASSESSMENT & PLAN:    Problem List Items Addressed This Visit    Leg swelling    I think now that her blood pressures are starting to drift up, she may be having a little diastolic dysfunction, however she is not having the other CHF symptoms.  I think we can probably back off on some of the free salt loading.  He is no longer requiring  midodrine, but I still would prefer to avoid antihypertensives.  Since she is feeling better with taking daily Lasix, my recommendation would be to essentially make it daily during weekdays and hold during weekends or unless she is traveling.  If she gains more than 3 pounds she will take additional dose.      History of ST elevation myocardial infarction (STEMI) (Chronic)    She presented with acute MI, and went for emergent CABG from the Cath Lab table to the OR upon presentation.  She has had remarkable recovery and function following CABG with EF back up to 50 to 55%.  Nonischemic Myoview.  No anginal symptoms.  Only minimal edema as possible "CHF symptoms.      Coronary artery disease with hx of myocardial infarct w/o hx of CABG (Chronic)    She is still doing remarkably well.  She recovered from her CABG and has been doing well since.  Nonischemic Myoview and follow-up.  Notably improved EF on both Myoview and echocardiogram.  Now that her blood pressure is a findings are normalized, she is feeling overall better and stronger.  I still think with her just having been weaned off of midodrine that is probably too soon to consider the consideration of either ARB or beta-blocker.  We stopped Plavix because of all of her   She remains on statin, PRN Lasix and omeprazole.      Hyperlipidemia with target LDL less than 70 (Chronic)    Labs were  checked a year ago with a PCPs office.  Unfortunately she is in the process of having to find a new PCP since her current PCP is moving.  She is due to get lipids checked we will have these checked within a month.  Continue current dose of statin.         ====================================  History of Present Illness:    Molly Chandler is a 86 y.o. female with cardiac history noted who presents via audio/video conferencing for a telehealth visit today as a as a follow-up from recent telephone call for edema.Marland Kitchen  CAD History:   Ant STEMI  Jan 27-28/2020:Cath ->dLM-OstLAD 95% (thrombotic), ost Cx 50%. Initial EF 25-35% - anterior HK. 2+ MR. Elevated LVEDP  Day of MI - Sx mostly related to GI Sx of N/V & diarrhea; after a significant bout - had a near syncopal episode - when EMS came, she "blacked out" & EMS indicated STEMI. ? Emergent CABGx2(Dr. Laneta Simmers): LIMA-LAD, SVG-OM. D/c'd on DAPT. ? Echo 10/11/2018: EF 50-55%. Normal valves. Abnormal Septal motion (normal post-op). Normal atrial size. ? Myoview 11/29/2019 :INTERMEDIATE RISK. Normal EF 55 to 65%. Medium size moderate severity fixed defect in the mid anterolateral and apical lateral wall consistent with prior INFARCT. NO EVIDENCE OF ISCHEMIA\ ? 2-week Zio patch monitor 12/30/2019: Predominant underlying rhythm is sinus rhythm: Minimum heart rate 48 bpm, maximum 143 bpm. Average 76 bpm.  Rare (<1% ) isolated PACs with couplets and triplets noted. Rare isolated PVCs with couplets.  7 brief bursts of SVT/PAT: Fastest was 6 beats at a rate of 188 bpm, longest lasted 11 beats average rate 129 bpm.  No prolonged arrhythmias: No atrial fibrillation, flutter or true SVT, VT or VF.Marland Kitchen  No bradycardia arrhythmias other than sinus bradycardia. No pauses or heart block.  Not currently on any blood pressure medications, has had issues with bradycardia and hypotension.  Molly Chandler was last seen by Terrilee Croak, PA and Corine Shelter, [place] on July 11, 2020.  She was still taking midodrine denied any further dizziness or lightheadedness.  Her systolic blood pressures were up into the 105-115 mmHg range.  Unfortunately, still not able to initiate a BP meds. => Was noting more symptoms related to fatigue that was still persistent.  Not having any lightheadedness or dizziness.  No chest pain or pressure.  She was still taking midodrine once in the morning but had not required any additional doses.  Does not like to wear support stockings.  Orthostatics were negative. => Labs  ordered.  Hospitalizations:  . None  3/1 PHONE IN WITH EDEMA --blood pressure was actually up as posterior weight.  Blood pressure was 140/60 9 mmHg.  Says her legs were painful and aching.  Not able to tolerate compression stockings because her legs hurt too much.  STARTED ON LASIX 20 mg as PRN..  I did agree with having her stop Plavix because of bruising.Marland Kitchen  Recent - Interim CV studies:    The following studies were reviewed today: . None:  Inerval History   Stanna Amrhein "Dot" says that she is feeling a lot better.  Her legs have notably improved since starting the Lasix.  She did not necessarily notice that she is lost any control of her bladder which was something she was fearful of with a diuretic.  She says that the leg pain and swelling is notably improved.  She is looking forward to the weather clearing up is to get back into doing her routine  exercise which mostly includes doing gardening and yard work.  She denies any further loss of balance.  No syncope or near syncope.  She no longer really seems to have that many symptoms of orthostatic hypotension.  Blood pressures seem to be better. Otherwise relatively benign Cardiovascular ROS: positive for - Well-controlled edema now with minimal exertional dyspnea.  Just a little fatigue. negative for - chest pain, irregular heartbeat, orthopnea, palpitations, paroxysmal nocturnal dyspnea, rapid heart rate or shortness of breath; syncope/near syncope or TIA/amaurosis fugax, claudication   ROS:  Please see the history of present illness.    The patient does not have symptoms concerning for COVID-19 infection (fever, chills, cough, or new shortness of breath).  Review of Systems  Constitutional: Negative for malaise/fatigue and weight loss.  HENT: Negative for congestion and nosebleeds.   Respiratory: Negative for cough, shortness of breath and wheezing.   Cardiovascular: Positive for leg swelling (Notably improved).   Gastrointestinal: Negative for blood in stool, constipation and melena.  Genitourinary: Negative for frequency and hematuria.  Musculoskeletal: Positive for joint pain (Routine musculoskeletal joint pains). Negative for falls.  Neurological: Positive for dizziness (Much less so.). Negative for focal weakness and seizures.  Psychiatric/Behavioral: Negative for memory loss. The patient is not nervous/anxious and does not have insomnia.   All other systems reviewed and are negative.   The patient is practicing social distancing.  Past Medical History:  Diagnosis Date  . Cataract   . Coronary artery disease with hx of myocardial infarct w/o hx of CABG 10/05/2018   Presented as anterior STEMI --> Cath revealed 95% distal LM-ostial LAD thrombotic lesion -> initial EF 20 to 25% (LV Gram) significant reduced, IABP => to OR ~2 hours after stabilization (due to emergency OR team already working). -> s/p CABG x 2 (LIMA-LAD, SVG-OM) -> post MI echo EF 50 to 55%.  Marland Kitchen History of ST elevation myocardial infarction (STEMI) 10/05/2018   Denies ever having chest pain.  Had nausea vomiting diarrhea ongoing since March.  After significant diarrhea episode had near syncope followed by syncope --> EMS EKG showed anterior ST elevation  . Hx of CABG 10/06/2018   Two-vessel CABG:  LIMA-LAD, SVG-OM  . Hyperlipidemia with target LDL less than 70 05/31/2019  . Lymphoma (HCC)    rectal cancer  . Osteoporosis    osteopenia   Past Surgical History:  Procedure Laterality Date  . CORONARY ARTERY BYPASS GRAFT N/A 10/05/2018   Procedure: CORONARY ARTERY BYPASS GRAFTING (CABG)x 2, LIMA-LAD, (right leg GSV) rSVG-OM (SVG harvest endoscopically);  Surgeon: Alleen Borne, MD;  Location: [facility] OR;  Service: Cardiothoracic;  Laterality: N/A;  . CORONARY/GRAFT ACUTE MI REVASCULARIZATION N/A 10/05/2018   no PCI --> IABP --> EMERGENT CABG X 2  . IABP INSERTION N/A 10/05/2018   Procedure: IABP Insertion;  Surgeon: Marykay Lex,  MD;  Location: [facility] INVASIVE CV LAB;  Service: Cardiovascular;  Laterality: N/A;  . LEFT HEART CATH AND CORONARY ANGIOGRAPHY N/A 10/05/2018   Procedure: LEFT HEART CATH AND CORONARY ANGIOGRAPHY;  Surgeon: Marykay Lex, MD;  Location: [facility] INVASIVE CV LAB; STEMI/SYNCOPE:  Cath -> dLM-OstLAD 95% (thrombotic), ost Cx 50%. Initial EF 25-35% - anterior HK.  2+ MR. Elevated LVEDP --> IABP -> CABG  . NM MYOVIEW LTD  11/2019   INTERMEDIATE RISK.  Normal EF 55 to 65%.  Medium size moderate severity fixed defect in the mid anterolateral and apical lateral wall consistent with prior INFARCT.  NO EVIDENCE OF ISCHEMIA  . TRANSTHORACIC  ECHOCARDIOGRAM  10/11/2018   Post CABG: EF 50-55%. Normal valves.  Abnormal Septal motion (normal post-op).  Normal atrial size.      Current Meds  Medication Sig  . acetaminophen (TYLENOL) 325 MG tablet Take 650 mg by mouth every 6 (six) hours as needed.  . Ascorbic Acid (VITAMIN C PO) Take 1 tablet by mouth.  Marland Kitchen atorvastatin (LIPITOR) 40 MG tablet Take 1 tablet (40 mg total) by mouth daily at 6 PM.  . Cholecalciferol (VITAMIN D PO) Take 1 tablet by mouth daily.   Marland Kitchen ELDERBERRY PO Take by mouth. Mag, zinc, immune system support  . furosemide (LASIX) 20 MG tablet Take 1 tablet (20 mg total) by mouth as needed (for edema).  . latanoprost (XALATAN) 0.005 % ophthalmic solution Place 1 drop into both eyes daily.  . Melatonin 10 MG TABS Take 10 mg by mouth as needed.  . Multiple Vitamin (MULTIVITAMIN) tablet Take 1 tablet by mouth daily.  . Omega-3 Fatty Acids (FISH OIL PO) Take by mouth daily.  Marland Kitchen omeprazole (PRILOSEC) 40 MG capsule Take 1 capsule (40 mg total) by mouth daily.  . Probiotic Product (PROBIOTIC DAILY PO) Take 1 tablet by mouth daily.    - NOT NEEDING MIDODRINE   Allergies:   Amoxicillin and Metoclopramide hcl   Social History   Tobacco Use  . Smoking status: Former Games developer  . Smokeless tobacco: Never Used  Substance Use Topics  . Alcohol use: No     Family  Hx: The patient's family history is not on file.   Labs/Other Tests and Data Reviewed:    EKG:  No ECG reviewed.  Recent Labs: 11/12/2020: ALT 12; BUN 17; Creatinine, Ser 0.69; Hemoglobin 11.5; Platelets 204.0; Potassium 4.6; Sodium 141; TSH 1.40   Recent Lipid Panel Lab Results  Component Value Date/Time   CHOL 100 11/24/2019 08:33 AM   TRIG 47 11/24/2019 08:33 AM   HDL 41 11/24/2019 08:33 AM   CHOLHDL 2.4 11/24/2019 08:33 AM   LDLCALC 47 11/24/2019 08:33 AM    Wt Readings from Last 3 Encounters:  11/16/20 165 lb (74.8 kg)  11/12/20 167 lb (75.8 kg)  10/19/20 165 lb (74.8 kg)     Objective:    Vital Signs:  BP 132/68   Pulse 77   Ht 4\' 11"  (1.499 m)   Wt 165 lb (74.8 kg)   BMI 33.33 kg/m   VITAL SIGNS:  reviewed Pleasant female in NO acute distress. Pleasant sounding elderly woman.  A&O x 3.  Normal Mood & Affect Non-labored respirations  ==========================================  COVID-19 Education: The signs and symptoms of COVID-19 were discussed with the patient and how to seek care for testing (follow up with PCP or arrange E-visit).   The importance of social distancing was discussed today.  Time:   Today, I have spent 17 minutes with the patient with telehealth technology discussing the above problems.   An additional spent charting (reviewing prior notes, hospital records, studies, labs etc.) Total   Medication Adjustments/Labs and Tests Ordered: Current medicines are reviewed at length with the patient today.  Concerns regarding medicines are outlined above.   Patient Instructions  Medication Instructions:   Without new fluid pill.  Maybe take it on Monday through Friday and then take a break on Saturday and Sunday.  You do not have to take it every day, specifically me about doing things.  *If you need a refill on your cardiac medications before your next appointment, please call your  pharmacy*   Lab Work: Unfortunately,  your cholesterol levels were not checked with your regular labs.  We will put in the lab slip for your cholesterol be drawn.  When you come to get your injections for your back, you can get your blood work checked.  Make sure that you are fasting.  If you have labs (blood work) drawn today and your tests are completely normal, you will receive your results only by: Marland Kitchen MyChart Message (if you have MyChart) OR . A paper copy in the mail If you have any lab test that is abnormal or we need to change your treatment, we will call you to review the results.   Testing/Procedures: No testing for now.   Follow-Up: At [facility], you and your health needs are our priority.  As part of our continuing mission to provide you with exceptional heart care, we have created designated Provider Care Teams.  These Care Teams include your primary Cardiologist (physician) and Advanced Practice Providers (APPs -  Physician Assistants and Nurse Practitioners) who all work together to provide you with the care you need, when you need it.  We recommend signing up for the patient portal called "MyChart".  Sign up information is provided on this After Visit Summary.  MyChart is used to connect with patients for Virtual Visits (Telemedicine).  Patients are able to view lab/test results, encounter notes, upcoming appointments, etc.  Non-urgent messages can be sent to your provider as well.   To learn more about what you can do with MyChart, go to [email or web address].    Your next appointment:   4 month(s)  The format for your next appointment:   In Person  Provider:   Bryan Lemma, MD   Other Instructions Keep staying active.  Make sure you stay hydrated. Also elevate those feet whenever you can-it will help the swelling.     Signed, Bryan Lemma, MD  11/16/2020 3:03 PM    [facility]

## 2020-11-21 ENCOUNTER — Telehealth: Payer: Self-pay | Admitting: Internal Medicine

## 2020-11-21 NOTE — Telephone Encounter (Signed)
Pt called in wanted to know about cholesterol level was it taken

## 2020-11-23 NOTE — Telephone Encounter (Signed)
Pt is aware Lipid panel was not done at last OV

## 2020-11-23 NOTE — Telephone Encounter (Signed)
Patient called in requesting to speak with someone to advise if she has had her cholesterol checked. Had patient speak with CMA.

## 2020-11-27 ENCOUNTER — Other Ambulatory Visit: Payer: Self-pay | Admitting: *Deleted

## 2020-11-27 DIAGNOSIS — E785 Hyperlipidemia, unspecified: Secondary | ICD-10-CM

## 2020-11-27 DIAGNOSIS — Z79899 Other long term (current) drug therapy: Secondary | ICD-10-CM

## 2020-11-27 LAB — LIPID PANEL
Chol/HDL Ratio: 2.7 ratio (ref 0.0–4.4)
Cholesterol, Total: 134 mg/dL (ref 100–199)
HDL: 50 mg/dL (ref >39)
LDL Chol Calc (NIH): 58 mg/dL (ref 0–99)
Triglycerides: 153 mg/dL — ABNORMAL HIGH (ref 0–149)
VLDL Cholesterol Cal: 26 mg/dL (ref 5–40)

## 2020-11-28 ENCOUNTER — Telehealth: Payer: Self-pay | Admitting: Internal Medicine

## 2020-11-28 NOTE — Telephone Encounter (Signed)
Needs reevaluation

## 2020-11-28 NOTE — Telephone Encounter (Signed)
Pt called in wanted to know if she can get a refill on the UTI medication due to she took all of it and the uti is still there

## 2020-11-29 ENCOUNTER — Other Ambulatory Visit: Payer: Self-pay

## 2020-11-29 ENCOUNTER — Ambulatory Visit (INDEPENDENT_AMBULATORY_CARE_PROVIDER_SITE_OTHER): Payer: Medicare Other | Admitting: Internal Medicine

## 2020-11-29 ENCOUNTER — Encounter: Payer: Self-pay | Admitting: Internal Medicine

## 2020-11-29 VITALS — BP 128/76 | HR 66 | Temp 96.9°F | Wt 169.0 lb

## 2020-11-29 DIAGNOSIS — R300 Dysuria: Secondary | ICD-10-CM

## 2020-11-29 DIAGNOSIS — Z23 Encounter for immunization: Secondary | ICD-10-CM | POA: Diagnosis not present

## 2020-11-29 DIAGNOSIS — S80811A Abrasion, right lower leg, initial encounter: Secondary | ICD-10-CM

## 2020-11-29 LAB — POC URINALSYSI DIPSTICK (AUTOMATED)
Bilirubin, UA: NEGATIVE
Blood, UA: NEGATIVE
Glucose, UA: NEGATIVE
Ketones, UA: NEGATIVE
Nitrite, UA: NEGATIVE
Protein, UA: NEGATIVE
Spec Grav, UA: 1.015 (ref 1.010–1.025)
Urobilinogen, UA: 0.2 E.U./dL
pH, UA: 5.5 (ref 5.0–8.0)

## 2020-11-29 MED ORDER — MUPIROCIN 2 % EX OINT: TOPICAL_OINTMENT | Freq: Two times a day (BID) | CUTANEOUS | 0 refills | Status: DC | PRN

## 2020-11-29 MED ORDER — NITROFURANTOIN MONOHYD MACRO 100 MG PO CAPS: 100.0000 mg | ORAL_CAPSULE | Freq: Two times a day (BID) | ORAL | 0 refills | Status: DC

## 2020-11-29 NOTE — Progress Notes (Signed)
HPI  Pt presents to the clinic today with c/o dysuria. This started 2 days ago. She denies urgency, frequency, bladder pressure, low back pain or blood in her urine. She denies fever, chills or nausea. She was treated for an E. coli UTI with Bactrim DS which the Ecoli was resistent too.  She also reports an abrasion to her right lower leg. She reports this occurred 10 days ago, after her daughters dog ran into her leg. She reports the area is tender and swollen but she has pus draining from the area. She has washed it with peroxide and used topical antibiotic ointment on it. Her last tetanus was 09/2011. She is not diabetic.  Review of Systems  Past Medical History:  Diagnosis Date  . Cataract   . Coronary artery disease with hx of myocardial infarct w/o hx of CABG 10/05/2018   Presented as anterior STEMI --> Cath revealed 95% distal LM-ostial LAD thrombotic lesion -> initial EF 20 to 25% (LV Gram) significant reduced, IABP => to OR ~2 hours after stabilization (due to emergency OR team already working). -> s/p CABG x 2 (LIMA-LAD, SVG-OM) -> post MI echo EF 50 to 55%.  Marland Kitchen History of ST elevation myocardial infarction (STEMI) 10/05/2018   Denies ever having chest pain.  Had nausea vomiting diarrhea ongoing since March.  After significant diarrhea episode had near syncope followed by syncope --> EMS EKG showed anterior ST elevation  . Hx of CABG 10/06/2018   Two-vessel CABG:  LIMA-LAD, SVG-OM  . Hyperlipidemia with target LDL less than 70 05/31/2019  . Lymphoma ([place])    rectal cancer  . Osteoporosis    osteopenia    No family history on file.  Social History   Socioeconomic History  . Marital status: Married    Spouse name: Not on file  . Number of children: 5  . Years of education: Not on file  . Highest education level: Not on file  Occupational History  . Occupation: Retired    Fish farm manager: RETIRED  Tobacco Use  . Smoking status: Former Research scientist (life sciences)  . Smokeless tobacco: Never Used   Substance and Sexual Activity  . Alcohol use: No  . Drug use: Not on file  . Sexual activity: Not on file  Other Topics Concern  . Not on file  Social History Narrative   Desires CPR   Would not want life support prolonged if futile   Social Determinants of Health   Financial Resource Strain: Not on file  Food Insecurity: Not on file  Transportation Needs: Not on file  Physical Activity: Not on file  Stress: Not on file  Social Connections: Not on file  Intimate Partner Violence: Not on file    Allergies  Allergen Reactions  . Amoxicillin Diarrhea and Nausea And Vomiting    GI intolerance.   . Metoclopramide Hcl Other (See Comments)    Stroke-like symptoms (dose too high?)     Constitutional: Denies fever, malaise, fatigue, headache or abrupt weight changes.   GU: Pt reports pain with urination. Denies urgency, frequency, burning sensation, blood in urine, odor or discharge. Skin: Pt reports abrasion to RLE. Denies redness, rashes, lesions.   No other specific complaints in a complete review of systems (except as listed in HPI above).    Objective:   Physical Exam BP 128/76   Pulse 66   Temp (!) 96.9 F (36.1 C) (Temporal)   Wt 169 lb (76.7 kg)   SpO2 97%   BMI 34.13 kg/m  Wt Readings from Last 3 Encounters:  11/29/20 169 lb (76.7 kg)  11/16/20 165 lb (74.8 kg)  11/12/20 167 lb (75.8 kg)    General: Appears her stated age, obese, in NAD. Cardiovascular: Normal rate. Pulmonary/Chest: Normal effort. Abdomen: Soft and nontender. Normal bowel sounds. No distention or masses noted. No CVA tenderness.        Assessment & Plan:   Dysuria:  Concern for recurrent UTI Urinalysis: 3+ leuks Will send urine culture eRx sent if for Macrobid 100 mg BID x 5 days OK to take AZO OTC Drink plenty of fluids  Abrasion of RLE:  Not infected Dressing changed by CMA- covered with triple antibiotic ointment, telfa and tape. Change daily RX for Bactroban ointment  BID prn Tetanus today  RTC as needed or if symptoms persist. Webb Silversmith, NP

## 2020-11-29 NOTE — Patient Instructions (Signed)
Abrasion An abrasion is a cut or a scrape on your skin. You must take care of your wound so germs do not get in it and cause infection. What are the causes? This condition is caused by rubbing your skin on something or falling on a surface, such as the ground. When your skin rubs on something, some layers of skin may rub off. What are the signs or symptoms?  A cut or a scrape.  Bleeding.  A red or pink spot.  A bruise under your wound. How is this treated? This condition may be treated with:  Cleaning your wound.  Putting ointment on your wound.  Putting a bandage on your wound.  Getting a tetanus shot. Follow these instructions at home: Your doctor may tell you to do these things: Medicines  Take or use over-the-counter and prescription medicines only as told by your doctor.  If you were prescribed an antibiotic medicine, use it as told by your doctor. Do not stop using it even if you start to feel better. Keep your wound clean  Clean your wound 1 or 2 times a day or as often told by your doctor. To do this: 1. Wash your hands for at least 20 seconds with mild soap and water. Do this before and after you clean your wound. 2. Wash your wound with mild soap and water. 3. Rinse off the soap. 4. Pat your wound with a clean towel to dry it. Do not rub your wound.  Keep your bandage clean and dry. Take it off and change it as told by your doctor. ? You may have to change your bandage one or more times a day, or as told by your doctor. Watch for signs of infection Check your wound every day for signs of infection. Check for:  A red streak that goes away from your wound.  Other redness.  Swelling or more pain.  Warmth.  Blood, fluid, pus, or a bad smell. Treat pain and swelling  If told, put ice on the injured area. To do this: ? Put ice in a plastic bag. ? Place a towel between your skin and the bag. ? Leave the ice on for 20 minutes, 2-3 times a day. ? Take off  the ice if your skin turns bright red. This is very important. If you cannot feel pain, heat, or cold, you have a greater risk of damage to the area.  If you can, raise the injured area above the level of your heart while you are sitting or lying down.   General instructions  Do not take baths, swim, or use a hot tub. Ask your doctor about taking showers or sponge baths.  Keep all follow-up visits. Contact a doctor if:  You had a tetanus shot, and you have any of these where the needle went in: ? Swelling. ? Very bad pain. ? Redness. ? Bleeding.  You have a lot of pain, and medicine does not help.  You have a fever.  You have any of these signs of infection in your wound: ? Redness, swelling, or more pain. ? Blood, fluid, pus, or a bad smell. ? Warmth. Get help right away if:  You have a red streak going away from your wound. Summary  An abrasion is a cut or a scrape on your skin. Take care of your wound so germs do not get in it.  Clean your wound 1 or 2 times a day or as often as told. Change  your bandage as told and use medicines as told.  Call your doctor if you have a fever or if you have redness, swelling, or more pain in your wound.  Call your doctor if you have blood, fluid, pus, or a bad smell in your wound.  Get help right away if you have a red streak going away from your wound. This information is not intended to replace advice given to you by your health care provider. Make sure you discuss any questions you have with your health care provider. Document Revised: 11/24/2019 Document Reviewed: 11/24/2019 Elsevier Patient Education  [place].

## 2020-11-29 NOTE — Addendum Note (Signed)
Addended by: Lurlean Nanny on: 11/29/2020 12:34 PM   Modules accepted: Orders

## 2020-11-30 ENCOUNTER — Telehealth: Payer: Self-pay | Admitting: Cardiology

## 2020-11-30 NOTE — Telephone Encounter (Signed)
    Patient calling for lab results 

## 2020-11-30 NOTE — Telephone Encounter (Signed)
The patient has been notified of the result and verbalized understanding.  All questions (if any) were answered. Raiford Simmonds, RN 11/30/2020 10:25 AM

## 2020-11-30 NOTE — Telephone Encounter (Signed)
-----   Message from Leonie Man, MD sent at 11/27/2020  5:39 PM EDT ----- Cholesterol levels look good.  Triglycerides are little bit high at 153, but otherwise everything looks good.  Continue current medications.  Glenetta Hew, MD   Also let her know, that I did send a note to Rollene Fare about try to make sure that she is forwarded to one of the other providers at the [facility] is moving elsewhere.

## 2020-12-01 LAB — URINE CULTURE
MICRO NUMBER:: 11688105
SPECIMEN QUALITY:: ADEQUATE

## 2020-12-04 ENCOUNTER — Other Ambulatory Visit: Payer: Self-pay

## 2020-12-04 MED ORDER — OMEPRAZOLE 40 MG PO CPDR: 40.0000 mg | DELAYED_RELEASE_CAPSULE | Freq: Every day | ORAL | 3 refills | Status: DC

## 2020-12-13 ENCOUNTER — Telehealth: Payer: Self-pay | Admitting: Cardiology

## 2020-12-13 NOTE — Telephone Encounter (Signed)
Spoke to patient . Informed patient she will  need contact primary for issues. Patient at last appointment with Dr Ellyn Hack  It was mention - no changes at this point RN aske  Patient if she mention this information at last  Primary visit . Patient states she did but it was not addressed. Patient states her primary NP was leaving the practice and that she could not se any one else there and that she would have to find another provider. RN  informed patient - that reason did sound correct.RN called to find out the details.   RN informed patient , RN contacted Financial controller at [facility]. Per scheduler - patient will not have permanent primary until anew  person has been establish , but patient will be seen for acute matters until the replacement comes or patient can transfer care another practice.  RN informed patient of conversation and patient states shew il call  [place] .

## 2020-12-13 NOTE — Telephone Encounter (Signed)
   Pt said she's been feeling weak all the time, and her feet hurts so much that she can't sleep, she said she have neuropathy in her feet. She is requesting to speak with Dr. Allison Quarry nurse

## 2020-12-27 ENCOUNTER — Telehealth: Payer: Self-pay | Admitting: Cardiology

## 2020-12-27 NOTE — Telephone Encounter (Signed)
Left message to call back  

## 2020-12-27 NOTE — Telephone Encounter (Signed)
Spoke with patient and relayed advice to HOLD atorvastatin. Scheduled lipid clinic/CVRR visit on 5/17 @ 11am

## 2020-12-27 NOTE — Telephone Encounter (Signed)
Pt c/o medication issue:  1. Name of Medication: Atorvastatin  2. How are you currently taking this medication (dosage and times per day)? 1 time in the evening  3. Are you having a reaction (difficulty breathing--STAT)? Sometimes short of breath  4. What is your medication issue? Pain in legs and feet, fluttering in her chest, can not sleep at night, having problems with her memory

## 2020-12-27 NOTE — Telephone Encounter (Signed)
Okay to hold atorvastatin for now. Please schedule appointment with pharmacist Lipid clinic to discuss therapy alternative if patient agreeable.

## 2021-01-17 ENCOUNTER — Telehealth: Payer: Self-pay | Admitting: Cardiology

## 2021-01-17 ENCOUNTER — Telehealth: Payer: Self-pay | Admitting: Pharmacist

## 2021-01-17 ENCOUNTER — Telehealth: Payer: Self-pay | Admitting: Licensed Clinical Social Worker

## 2021-01-17 DIAGNOSIS — E785 Hyperlipidemia, unspecified: Secondary | ICD-10-CM

## 2021-01-17 NOTE — Telephone Encounter (Signed)
Spoke with pt, she is eligible for transportation services offered. However pt still not agreeable to appt at this time and did not want me to schedule a ride for her yet. Despite our explanations, it sounds like she would like clarity on what the appt is for/further clarity. Offered for her to speak with pharmacy team to review what the visit would look like.  I have routed this to Raquel w/ pharmacy team to see if we can get some of her questions answered.  If she is agreeable to the appointment and would like transportation assistance please let me know and we can get that sorted for her.   Westley Hummer, MSW, [place]  [phone]

## 2021-01-17 NOTE — Telephone Encounter (Signed)
Please see medication management note. Appointment cancelled.

## 2021-01-17 NOTE — Telephone Encounter (Signed)
Patient would like to know if her appointment on 01/22/21 with PharmD is necessary. She states she would like to discuss the purpose specifically to determine whether it is worth keeping. Otherwise, she would like to reschedule. She is considering cancelling due to financial reasons and she states it also takes her 50 minutes to get to the office.

## 2021-01-17 NOTE — Telephone Encounter (Signed)
We discuss reason for appointment.  Discuss previous therapy, previous  adverse reactions, and select best available alternatives to treat her cholesterol. Emphasis placed on controlling her LDL due to history of open heart surgery and existing partly blockage in her arteries. We can also provide financial assistance if needed.  Patient insist they are " lots of natural things" to control her cholesterol. I explain that no natural supplement out there shows efficacy for sufficient LDL lowering. She still insist on natural therapy.  Recommendation given to try Cholestoff Complete and repeat fasting blood work in 3 months. If LDL remains above goal, we will need to consider alternative therapy.  *Appoiemtn for next week cancelled*

## 2021-01-17 NOTE — Telephone Encounter (Signed)
Spoke with pt, she is eligible for transportation services offered. However pt still not agreeable to appt at this time and did not want me to schedule a ride for her yet. Despite our explanations, it sounds like she would like clarity on what the appt is for. Offered for her to speak with pharmacy team to review what the visit would look like.  I have routed this to Raquel w/ pharmacy team to see if we can get some of her questions answered. At this time per Raquel no transportation needed.   Westley Hummer, MSW, [place]  [phone]

## 2021-01-17 NOTE — Telephone Encounter (Signed)
This RN called patient at [phone], this RN told patient her visit with PharmD was at the recommendation of Raquel, PharmD to re-evaluate following the patient stopping her atorvastatin last month (telephone visit on 12/27/2020). Patient maintains that she does not understand why appointment is necessary despite several explanations. Patient reported that gas/transportation concerns were a big part of her desire to cancel appointment. This RN suggested [place] with social work may be able to assist with some of these concerns, patient agreeable.

## 2021-01-21 ENCOUNTER — Telehealth: Payer: Self-pay | Admitting: Cardiology

## 2021-01-21 ENCOUNTER — Other Ambulatory Visit: Payer: Self-pay

## 2021-01-21 ENCOUNTER — Other Ambulatory Visit (INDEPENDENT_AMBULATORY_CARE_PROVIDER_SITE_OTHER): Payer: Medicare Other

## 2021-01-21 DIAGNOSIS — E785 Hyperlipidemia, unspecified: Secondary | ICD-10-CM | POA: Diagnosis not present

## 2021-01-21 NOTE — Telephone Encounter (Signed)
Pt is returning call.  

## 2021-01-21 NOTE — Telephone Encounter (Signed)
    Pt went to get lab work today for her lipids, she didn't know that she needs to fast and ate a biscuit before going to get blood drawn, she said she mentioned that to the tech and the tech still proceeded and drew her blood. She said hopefully that will give an accurate reading because she doesn't want another blood draw again. She also mentioned she get bruise so fast and doesn't know what is going on

## 2021-01-21 NOTE — Telephone Encounter (Signed)
Left message to call back  

## 2021-01-21 NOTE — Telephone Encounter (Signed)
Pt called to make MD aware that she forgot today that she was suppose to fast prior to labs. Pt state she does not have a desire to have labs repeated because she bruise easily. Will forward to make aware.

## 2021-01-22 ENCOUNTER — Ambulatory Visit: Payer: Medicare Other

## 2021-01-22 LAB — LIPID PANEL
Chol/HDL Ratio: 4.3 ratio (ref 0.0–4.4)
Cholesterol, Total: 221 mg/dL — ABNORMAL HIGH (ref 100–199)
HDL: 51 mg/dL (ref >39)
LDL Chol Calc (NIH): 159 mg/dL — ABNORMAL HIGH (ref 0–99)
Triglycerides: 62 mg/dL (ref 0–149)
VLDL Cholesterol Cal: 11 mg/dL (ref 5–40)

## 2021-01-22 LAB — SPECIMEN STATUS REPORT

## 2021-01-25 ENCOUNTER — Telehealth: Payer: Self-pay | Admitting: Cardiology

## 2021-01-25 NOTE — Telephone Encounter (Signed)
The patient has been made aware that as soon as the results are read, the office will call her with the results.

## 2021-01-25 NOTE — Telephone Encounter (Signed)
New message:   Patient calling to get results of her blood work that was done Monday.

## 2021-02-08 ENCOUNTER — Other Ambulatory Visit: Payer: Self-pay | Admitting: Cardiology

## 2021-02-08 ENCOUNTER — Telehealth: Payer: Self-pay | Admitting: *Deleted

## 2021-02-08 ENCOUNTER — Other Ambulatory Visit: Payer: Self-pay | Admitting: *Deleted

## 2021-02-08 DIAGNOSIS — I2510 Atherosclerotic heart disease of native coronary artery without angina pectoris: Secondary | ICD-10-CM

## 2021-02-08 DIAGNOSIS — E785 Hyperlipidemia, unspecified: Secondary | ICD-10-CM

## 2021-02-08 DIAGNOSIS — Z79899 Other long term (current) drug therapy: Secondary | ICD-10-CM

## 2021-02-08 DIAGNOSIS — I252 Old myocardial infarction: Secondary | ICD-10-CM

## 2021-02-08 NOTE — Telephone Encounter (Signed)
PATIENT DID NOT FAST WITH LAB WORK . PATIENT STATED IN PREVIOUS CALL.  LEFT DETAIL MESSAGE FOR PATIENT TO RECHECK LABS IN 2 MONTHS ANY QUESTION MAY CALL BACK  LABS ORDERED

## 2021-02-08 NOTE — Telephone Encounter (Signed)
-----   Message from Leonie Man, MD sent at 02/02/2021  7:55 PM EDT ----- Very unusual change in cholesterol levels in just 2 months.  LDL went up from 58-1 59 Impella pressure from 134-221.  I suspect that having just eaten something may have thrown off the numbers.  We can recheck in the next month or 2.-Make sure that it is fasting.  Glenetta Hew, MD

## 2021-02-12 ENCOUNTER — Ambulatory Visit: Payer: Medicare Other

## 2021-03-05 ENCOUNTER — Other Ambulatory Visit: Payer: Self-pay | Admitting: Cardiology

## 2021-03-21 ENCOUNTER — Telehealth: Payer: Self-pay

## 2021-03-21 NOTE — Telephone Encounter (Signed)
Patient Name: Molly Chandler  DOB: 1935-02-23 MRN: [id number]  Primary Cardiologist: Bryan Lemma, MD  Chart reviewed as part of pre-operative protocol coverage.   IF SIMPLE EXTRACTION/CLEANINGS: Simple dental extractions are considered low risk procedures per guidelines and generally do not require any specific cardiac clearance. It is also generally accepted that for simple extractions and dental cleanings, there is no need to interrupt blood thinner therapy.  SBE prophylaxis is not required for the patient from a cardiac standpoint.  Verbal consent given to [place] at Dr. Elise Benne office. I will remove from the pre-op pool at this time.  Beatriz Stallion, PA-C 03/21/2021, 1:04 PM

## 2021-03-21 NOTE — Telephone Encounter (Signed)
What dental office are you calling from? Dr Eddie North   What is your office phone number? [phone]   What is your fax number? What type of procedure is the patient having performed? [phone]   What date is procedure scheduled or is the patient there now? Teeth cleaning    What is your question (ex. Antibiotics prior to procedure, holding medication-we need to know how long dentist wants pt to hold med)? Does she need to be pre medicated    (If the patient is currently at the dentist's office, call Pre-Op APP to address. If the patient is not currently in the dentist office, please route to the Pre-Op pool)

## 2021-04-16 ENCOUNTER — Telehealth: Payer: Self-pay

## 2021-04-16 DIAGNOSIS — E785 Hyperlipidemia, unspecified: Secondary | ICD-10-CM

## 2021-04-16 DIAGNOSIS — Z79899 Other long term (current) drug therapy: Secondary | ICD-10-CM

## 2021-04-16 DIAGNOSIS — I2510 Atherosclerotic heart disease of native coronary artery without angina pectoris: Secondary | ICD-10-CM

## 2021-04-16 NOTE — Telephone Encounter (Signed)
-----   Message from Harrington Challenger, [place] sent at 01/17/2021  1:09 PM EDT ----- Regarding: Lipid panel Repeat lipid panel (unless recently done) by Dr Ellyn Hack.

## 2021-04-16 NOTE — Telephone Encounter (Signed)
I returned a call to the pt that stated she didn't wish to get labs done I voiced that is completely her decision and to have a nice day

## 2021-04-16 NOTE — Telephone Encounter (Signed)
Called and lmomed pt to complete fasting lipid and lft per raquels instruction since she post dated the msg to me before she left. Orders had been plaedd but they weren't released so I released them.

## 2021-04-16 NOTE — Telephone Encounter (Signed)
    Pt returning call, she wanted to know why she needs to get another blood work, she said its hard for her to drive all the way to [place] since gas cost expensive

## 2021-06-04 LAB — HEPATIC FUNCTION PANEL
ALT: 10 IU/L (ref 0–32)
AST: 20 IU/L (ref 0–40)
Albumin: 4.4 g/dL (ref 3.6–4.6)
Alkaline Phosphatase: 69 IU/L (ref 44–121)
Bilirubin Total: 0.3 mg/dL (ref 0.0–1.2)
Bilirubin, Direct: <0.1 mg/dL (ref 0.00–0.40)
Total Protein: 6.5 g/dL (ref 6.0–8.5)

## 2021-06-04 LAB — LIPID PANEL
Chol/HDL Ratio: 4.4 ratio (ref 0.0–4.4)
Cholesterol, Total: 199 mg/dL (ref 100–199)
HDL: 45 mg/dL (ref >39)
LDL Chol Calc (NIH): 137 mg/dL — ABNORMAL HIGH (ref 0–99)
Triglycerides: 92 mg/dL (ref 0–149)
VLDL Cholesterol Cal: 17 mg/dL (ref 5–40)

## 2021-06-14 ENCOUNTER — Telehealth: Payer: Self-pay | Admitting: *Deleted

## 2021-06-14 NOTE — Telephone Encounter (Signed)
Left message to call back  

## 2021-06-14 NOTE — Telephone Encounter (Signed)
-----   Message from Leonie Man, MD sent at 06/07/2021  1:00 PM EDT ----- Normal liver function.  Cholesterol levels do look better than l 4 months ago, but notably worse than a year ago and 6 months ago.  We need to make sure that she is taking her statin medication.  Seems strange that labs have really changed dramatically in 1 year.  If she is not able to tolerate her medicines, then we need to assess other options.  Glenetta Hew, MD

## 2021-06-17 NOTE — Telephone Encounter (Signed)
-----   Message from Leonie Man, MD sent at 06/07/2021  1:00 PM EDT ----- Normal liver function.  Cholesterol levels do look better than l 4 months ago, but notably worse than a year ago and 6 months ago.  We need to make sure that she is taking her statin medication.  Seems strange that labs have really changed dramatically in 1 year.  If she is not able to tolerate her medicines, then we need to assess other options.  Glenetta Hew, MD

## 2021-06-17 NOTE — Telephone Encounter (Signed)
Spoke to patient . Result given . Patient states she does not like any medication like statin. She does not want to take any medications. She takes cholesterol -off, cholesterol garlic as well as omega-fish oil. RN  suggest changing  diet . Low starch /carb diet, eat cold water fish like tuna, salmon, mackrel .  Patient again states she does not like taking medications Patient aware will defer to Dr Ellyn Hack.

## 2021-07-05 MED ORDER — EZETIMIBE 10 MG PO TABS: 10.0000 mg | ORAL_TABLET | Freq: Every day | ORAL | 4 refills | Status: DC

## 2021-07-05 NOTE — Telephone Encounter (Signed)
If she is not tolerant of statins --  Lets try Zetia 10 mg    (Start with 30 d supply, 3 refills - if tolerated -can change to 90 d)   DH    Left a message on patient voicemail of the additional medications to local pharmacy #30 day . Any question may call back

## 2021-07-05 NOTE — Telephone Encounter (Signed)
Called left message for patient Dr Ellyn Hack recommend starting Zetia.  It is up to patient if she would like start medication. Zetia has been sent to the pharmacy

## 2021-07-05 NOTE — Telephone Encounter (Signed)
Patient is returning call, requesting to speak with RN. She states she is not interested in going on any cholesterol medications and she plans to continue to take supplements instead.

## 2021-07-10 ENCOUNTER — Telehealth: Payer: Self-pay | Admitting: Cardiology

## 2021-07-10 NOTE — Telephone Encounter (Signed)
Pt c/o medication issue:  1. Name of Medication: ezetimibe (ZETIA) 10 MG tablet  2. How are you currently taking this medication (dosage and times per day)? PT IS NOT TAKING THIS MEDICINE YET  3. Are you having a reaction (difficulty breathing--STAT)? NO  4. What is your medication issue? PT IS NOT GOING TO START TAKING THIS MEDICINE BECAUSE SHE READ THAT IT WILL DAMAGE HER LIVER. PT STATES THAT HER DAUGHTER WASTED $20 ON THIS MEDICINE FOR NOTHING!!

## 2021-08-29 ENCOUNTER — Other Ambulatory Visit: Payer: Self-pay | Admitting: Cardiology

## 2021-10-03 ENCOUNTER — Other Ambulatory Visit: Payer: Self-pay | Admitting: Cardiology

## 2021-11-13 ENCOUNTER — Telehealth: Payer: Self-pay

## 2021-11-13 NOTE — Telephone Encounter (Signed)
Left message for patient to call back and schedule the Medicare Annual Wellness Visit (AWV) virtually, telephone or face to face. ?  ? ?[phone] ?Annaleigh Steinmeyer<CMA ?

## 2021-11-14 ENCOUNTER — Ambulatory Visit (INDEPENDENT_AMBULATORY_CARE_PROVIDER_SITE_OTHER): Payer: Medicare Other

## 2021-11-14 ENCOUNTER — Other Ambulatory Visit: Payer: Self-pay

## 2021-11-14 DIAGNOSIS — Z0000 Encounter for general adult medical examination without abnormal findings: Secondary | ICD-10-CM

## 2021-11-14 NOTE — Progress Notes (Addendum)
Virtual Visit via Telephone Note  I connected with Molly Chandler on 11/14/21 at  2:00 PM EST by telephone and verified that I am speaking with the correct person using two identifiers.  Location: Patient: Molly Chandler  Provider: Webb Silversmith, NP   I discussed the limitations, risks, security and privacy concerns of performing an evaluation and management service by telephone and the availability of in person appointments. I also discussed with the patient that there may be a patient responsible charge related to this service. The patient expressed understanding and agreed to proceed.     I discussed the assessment and treatment plan with the patient. The patient was provided an opportunity to ask questions and all were answered. The patient agreed with the plan and demonstrated an understanding of the instructions.   The patient was advised to call back or seek an in-person evaluation if the symptoms worsen or if the condition fails to improve as anticipated.  I provided 40 minutes of non-face-to-face time during this encounter.   Wilson Singer, CMA        HPI:  Patient presents to clinic today for their subsequent annual Medicare wellness exam.  Past Medical History:  Diagnosis Date   Cataract    Coronary artery disease with hx of myocardial infarct w/o hx of CABG 10/05/2018   Presented as anterior STEMI --> Cath revealed 95% distal LM-ostial LAD thrombotic lesion -> initial EF 20 to 25% (LV Gram) significant reduced, IABP => to OR ~2 hours after stabilization (due to emergency OR team already working). -> s/p CABG x 2 (LIMA-LAD, SVG-OM) -> post MI echo EF 50 to 55%.   History of ST elevation myocardial infarction (STEMI) 10/05/2018   Denies ever having chest pain.  Had nausea vomiting diarrhea ongoing since March.  After significant diarrhea episode had near syncope followed by syncope --> EMS EKG showed anterior ST elevation   Hx of CABG 10/06/2018   Two-vessel  CABG:  LIMA-LAD, SVG-OM   Hyperlipidemia with target LDL less than 70 05/31/2019   Lymphoma ([place])    rectal cancer   Osteoporosis    osteopenia    Current Outpatient Medications  Medication Sig Dispense Refill   acetaminophen (TYLENOL) 325 MG tablet Take 650 mg by mouth every 6 (six) hours as needed.     Ascorbic Acid (VITAMIN C PO) Take 1 tablet by mouth.     Cholecalciferol (VITAMIN D PO) Take 1 tablet by mouth daily.      ELDERBERRY PO Take by mouth. Mag, zinc, immune system support     latanoprost (XALATAN) 0.005 % ophthalmic solution Place 1 drop into both eyes daily.     Melatonin 10 MG TABS Take 10 mg by mouth as needed.     Misc Natural Products (CHOLESTEROL SUPPORT PO) Take by mouth.     Multiple Vitamin (MULTIVITAMIN) tablet Take 1 tablet by mouth daily.     Omega-3 Fatty Acids (FISH OIL PO) Take by mouth daily.     omeprazole (PRILOSEC) 40 MG capsule TAKE 1 CAPSULE (40 MG TOTAL) BY MOUTH DAILY. 90 capsule 1   Probiotic Product (PROBIOTIC DAILY PO) Take 1 tablet by mouth daily.      ezetimibe (ZETIA) 10 MG tablet TAKE 1 TABLET BY MOUTH EVERY DAY (Patient not taking: Reported on 11/14/2021) 90 tablet 0   No current facility-administered medications for this visit.    Allergies  Allergen Reactions   Amoxicillin Diarrhea and Nausea And Vomiting    GI intolerance.  Metoclopramide Hcl Other (See Comments)    Stroke-like symptoms (dose too high?)   Atorvastatin Other (See Comments)    Pain in extremities, memory issues   Metoclopramide     Other reaction(s): Unknown   Pregabalin     History reviewed. No pertinent family history.  Social History   Socioeconomic History   Marital status: Widowed    Spouse name: Not on file   Number of children: 5   Years of education: Not on file   Highest education level: Not on file  Occupational History   Occupation: Retired    Fish farm manager: RETIRED  Tobacco Use   Smoking status: Former   Smokeless tobacco: Never  Brewing technologist Use: Not on file  Substance and Sexual Activity   Alcohol use: No   Drug use: Never   Sexual activity: Not on file  Other Topics Concern   Not on file  Social History Narrative   Desires CPR   Would not want life support prolonged if futile   Social Determinants of Health   Financial Resource Strain: High Risk   Difficulty of Paying Living Expenses: Hard  Food Insecurity: No Food Insecurity   Worried About Charity fundraiser in the Last Year: Never true   [place] in the Last Year: Never true  Transportation Needs: No Transportation Needs   Lack of Transportation (Medical): No   Lack of Transportation (Non-Medical): No  Physical Activity: Insufficiently Active   Days of Exercise per Week: 2 days   Minutes of Exercise per Session: 30 min  Stress: Stress Concern Present   Feeling of Stress : Rather much  Social Connections: Moderately Integrated   Frequency of Communication with Friends and Family: More than three times a week   Frequency of Social Gatherings with Friends and Family: More than three times a week   Attends Religious Services: 1 to 4 times per year   Active Member of [employer] or Organizations: Yes   Attends Archivist Meetings: 1 to 4 times per year   Marital Status: Widowed  Intimate Partner Violence: Not on file    Hospitiliaztions: No hospitalization in the past 12 mths. Health Maintenance: COVID: Declined  Mammogram: 08/01/19, not indicated  Zoster: 04/27/14 Influenza: 05/14/2021 TDAP: 11/29/20 Pneumococcal: 05/08/17 Prevnar: 06/04/18 Shingrix: 06/04/18 Opthalmology:  Annual Dentist:Biannual   Mammogram: 08/01/19, no longer indicated Pap Smear: No longer indicated  Bone Density: 10/301/9 Colon Screening: 01/01/2018 Eye Exam: Annually  Dental Exam: Annually   Providers:  PCP: Webb Silversmith, NP Cardiology: Glenetta Hew, MD Orthopedics: Baird Kay, MD Urology: Bjorn Loser, MD    I have personally reviewed and  have noted:  1. The patient's medical and social history 2. Their use of alcohol, tobacco or illicit drugs 3. Their current medications and supplements 4. The patient's functional ability including ADL's, fall risks, home safety risks and hearing or visual impairment. 5. Diet and physical activities 6. Evidence for depression or mood disorder  Subjective:   Review of Systems:   Constitutional: Denies fever, malaise, fatigue, headache or abrupt weight changes.  HEENT: Denies eye pain, eye redness, ear pain, ringing in the ears, wax buildup, runny nose, nasal congestion, bloody nose, or sore throat. Respiratory: Denies difficulty breathing, shortness of breath, cough or sputum production.   Cardiovascular: Denies chest pain, chest tightness, palpitations or swelling in the hands or feet.  Gastrointestinal: Denies abdominal pain, bloating, constipation, diarrhea or blood in the stool.  GU: Denies urgency,  frequency, pain with urination, burning sensation, blood in urine, odor or discharge. Musculoskeletal: Denies decrease in range of motion, difficulty with gait, muscle pain or joint pain and swelling.  Skin: Denies redness, rashes, lesions or ulcercations.  Neurological: Denies dizziness, difficulty with memory, difficulty with speech or problems with balance and coordination.  Psych: Denies anxiety, depression, SI/HI.  No other specific complaints in a complete review of systems (except as listed in HPI above).  Objective:  PE:   There were no vitals taken for this visit. Wt Readings from Last 3 Encounters:  11/29/20 169 lb (76.7 kg)  11/16/20 165 lb (74.8 kg)  11/12/20 167 lb (75.8 kg)     BMET    Component Value Date/Time   NA 141 11/12/2020 1417   NA 139 07/11/2020 1502   NA 138 08/05/2014 1652   K 4.6 11/12/2020 1417   K 3.7 08/05/2014 1652   CL 106 11/12/2020 1417   CL 104 08/05/2014 1652   CO2 29 11/12/2020 1417   CO2 29 08/05/2014 1652   GLUCOSE 93 11/12/2020  1417   GLUCOSE 89 08/05/2014 1652   BUN 17 11/12/2020 1417   BUN 19 07/11/2020 1502   BUN 19 (H) 08/05/2014 1652   CREATININE 0.69 11/12/2020 1417   CREATININE 0.72 08/05/2014 1652   CREATININE 0.66 08/01/2011 1536   CALCIUM 9.3 11/12/2020 1417   CALCIUM 9.1 08/05/2014 1652   GFRNONAA 75 07/11/2020 1502   GFRNONAA >60 08/05/2014 1652   GFRAA 87 07/11/2020 1502   GFRAA >60 08/05/2014 1652    Lipid Panel     Component Value Date/Time   CHOL 199 06/04/2021 1125   TRIG 92 06/04/2021 1125   HDL 45 06/04/2021 1125   CHOLHDL 4.4 06/04/2021 1125   CHOLHDL 3.8 10/05/2018 0708   VLDL 4 10/05/2018 0708   LDLCALC 137 (H) 06/04/2021 1125    CBC    Component Value Date/Time   WBC 6.2 11/12/2020 1417   RBC 3.57 (L) 11/12/2020 1417   HGB 11.5 (L) 11/12/2020 1417   HGB 13.1 07/11/2020 1502   HGB 13.3 10/21/2010 0856   HGB 12.6 12/23/2007 0811   HCT 34.3 (L) 11/12/2020 1417   HCT 39.1 07/11/2020 1502   HCT 40.0 10/21/2010 0856   HCT 36.0 12/23/2007 0811   PLT 204.0 11/12/2020 1417   PLT 233 07/11/2020 1502   MCV 96.0 11/12/2020 1417   MCV 95 07/11/2020 1502   MCV 95 08/05/2014 1652   MCV 93 10/21/2010 0856   MCV 90.5 12/23/2007 0811   MCH 31.8 07/11/2020 1502   MCH 30.4 10/12/2018 0324   MCHC 33.6 11/12/2020 1417   RDW 14.3 11/12/2020 1417   RDW 11.6 (L) 07/11/2020 1502   RDW 13.7 08/05/2014 1652   RDW 11.9 10/21/2010 0856   RDW 13.4 12/23/2007 0811   LYMPHSABS 1.3 11/15/2018 1346   LYMPHSABS 2.1 08/05/2014 1652   LYMPHSABS 1.1 10/21/2010 0856   LYMPHSABS 1.1 12/23/2007 0811   MONOABS 0.4 11/15/2018 1346   MONOABS 0.5 08/05/2014 1652   MONOABS 0.4 12/23/2007 0811   EOSABS 0.4 11/15/2018 1346   EOSABS 0.1 08/05/2014 1652   EOSABS 0.1 10/21/2010 0856   BASOSABS 0.1 11/15/2018 1346   BASOSABS 0.1 08/05/2014 1652   BASOSABS 0.0 10/21/2010 0856   BASOSABS 0.0 12/23/2007 0811    Hgb A1C Lab Results  Component Value Date   HGBA1C 5.5 11/12/2020      Assessment  and Plan:   Medicare Annual Wellness Visit:  Diet: Heart healthy  Physical activity: Sedentary Depression/mood screen: Positive  Flowsheet Row Clinical Support from 05/08/2017 in [facility] [place] at [facility]  PHQ-9 Total Score 13      Hearing: Intact to whispered voice Visual acuity: Grossly normal, performs annual eye exam  ADLs: Capable Fall risk: None Home safety: Good FALL RISK PREVENTION PERTAINING TO THE HOME:   Any stairs in or around the home? no If so, are there any without handrails? No    Home free of loose throw rugs in walkways, pet beds, electrical cords, etc? Yes  Adequate lighting in your home to reduce risk of falls? Yes    ASSISTIVE DEVICES UTILIZED TO PREVENT FALLS:   Life alert? No  Use of a cane, walker or w/c? No  Grab bars in the bathroom? No  Shower chair or bench in shower? No  Elevated toilet seat or a handicapped toilet? No        Cognitive evaluation:  6CIT Screen 11/14/2021  What Year? 0 points  What month? 0 points  What time? 0 points  Count back from 20 0 points  Months in reverse 0 points  Repeat phrase 4 points  Total Score 4     EOL planning: No Adv directives, full code/ I agree  Nurse's Notes: The pt is currently living in [place], [place] about 30 miles from her home with her disabled paralysis son. She moved in with him to help take care of him.    Next appointment: F/u 1 yr Annual Wellness Exam     Wilson Singer, [place]

## 2021-11-26 NOTE — Progress Notes (Signed)
Medicare wellness performed by CMA, note reviewed ?Webb Silversmith, NP ? ?

## 2021-12-02 ENCOUNTER — Ambulatory Visit: Payer: Medicare Other | Admitting: Cardiology

## 2022-01-22 ENCOUNTER — Ambulatory Visit
Admission: EM | Admit: 2022-01-22 | Discharge: 2022-01-22 | Disposition: A | Discharge status: Home / Self Care | Payer: Medicare Other | Attending: Urgent Care | Admitting: Urgent Care

## 2022-01-22 DIAGNOSIS — S81811A Laceration without foreign body, right lower leg, initial encounter: Secondary | ICD-10-CM | POA: Diagnosis not present

## 2022-01-22 DIAGNOSIS — S8991XA Unspecified injury of right lower leg, initial encounter: Secondary | ICD-10-CM | POA: Diagnosis not present

## 2022-01-22 DIAGNOSIS — S8011XA Contusion of right lower leg, initial encounter: Secondary | ICD-10-CM | POA: Diagnosis not present

## 2022-01-22 MED ORDER — CEPHALEXIN 500 MG PO CAPS: 500.0000 mg | ORAL_CAPSULE | Freq: Four times a day (QID) | ORAL | 0 refills | Status: DC

## 2022-01-22 NOTE — ED Triage Notes (Signed)
Pt c/o right leg injury ~ Monday. States she accidentally punctured herself w/ a screwdriver while working on her chair at home. Leg is painful, edematous, severely discolored.  ?

## 2022-01-22 NOTE — ED Provider Notes (Signed)
[facility]    CSN: [id number] Arrival date & time: 01/22/22  1026      History   Chief Complaint Chief Complaint  Patient presents with   right leg injury    HPI Molly Chandler is a 87 y.o. female.   87yo female presents today after injury to her R lower leg earlier this week. She was trying to reupholster a chair and states she slipped with the screwdriver, tearing her skin on the R lower lateral shin. She reports keeping it clean at home but is concerned it is developing infection due to the pain. She admits to bruising easily. She takes tylenol once daily. She denies systemic symptoms including fever, weakness, or change in mentation. She is otherwise at baseline health. Is able to fully weight bear. Denies issues with ROM of knee or ankle.    Past Medical History:  Diagnosis Date   Cataract    Coronary artery disease with hx of myocardial infarct w/o hx of CABG 10/05/2018   Presented as anterior STEMI --> Cath revealed 95% distal LM-ostial LAD thrombotic lesion -> initial EF 20 to 25% (LV Gram) significant reduced, IABP => to OR ~2 hours after stabilization (due to emergency OR team already working). -> s/p CABG x 2 (LIMA-LAD, SVG-OM) -> post MI echo EF 50 to 55%.   History of ST elevation myocardial infarction (STEMI) 10/05/2018   Denies ever having chest pain.  Had nausea vomiting diarrhea ongoing since March.  After significant diarrhea episode had near syncope followed by syncope --> EMS EKG showed anterior ST elevation   Hx of CABG 10/06/2018   Two-vessel CABG:  LIMA-LAD, SVG-OM   Hyperlipidemia with target LDL less than 70 05/31/2019   Lymphoma ([place])    rectal cancer   Osteoporosis    osteopenia    Patient Active Problem List   Diagnosis Date Noted   Leg swelling 11/16/2020   Hyperlipidemia with target LDL less than 70 05/31/2019   Coronary artery disease with hx of myocardial infarct w/o hx of CABG 11/12/2018   History of ST elevation  myocardial infarction (STEMI) 10/05/2018   S/P CABG x 2 10/05/2018   Osteoarthritis 02/05/2018   Osteoporosis 02/05/2018   GERD (gastroesophageal reflux disease) 09/26/2011    Past Surgical History:  Procedure Laterality Date   CORONARY ARTERY BYPASS GRAFT N/A 10/05/2018   Procedure: CORONARY ARTERY BYPASS GRAFTING (CABG)x 2, LIMA-LAD, (right leg GSV) rSVG-OM (SVG harvest endoscopically);  Surgeon: Gaye Pollack, MD;  Location: [facility] OR;  Service: Cardiothoracic;  Laterality: N/A;   CORONARY/GRAFT ACUTE MI REVASCULARIZATION N/A 10/05/2018   no PCI --> IABP --> EMERGENT CABG X 2   IABP INSERTION N/A 10/05/2018   Procedure: IABP Insertion;  Surgeon: Leonie Man, MD;  Location: [facility];  Service: Cardiovascular;  Laterality: N/A;   LEFT HEART CATH AND CORONARY ANGIOGRAPHY N/A 10/05/2018   Procedure: LEFT HEART CATH AND CORONARY ANGIOGRAPHY;  Surgeon: Leonie Man, MD;  Location: [facility] INVASIVE CV LAB; STEMI/SYNCOPE:  Cath -> dLM-OstLAD 95% (thrombotic), ost Cx 50%. Initial EF 25-35% - anterior HK.  2+ MR. Elevated LVEDP --> IABP -> CABG   NM MYOVIEW LTD  11/2019   INTERMEDIATE RISK.  Normal EF 55 to 65%.  Medium size moderate severity fixed defect in the mid anterolateral and apical lateral wall consistent with prior INFARCT.  NO EVIDENCE OF ISCHEMIA   TRANSTHORACIC ECHOCARDIOGRAM  10/11/2018   Post CABG: EF 50-55%. Normal valves.  Abnormal Septal motion (  normal post-op).  Normal atrial size.     OB History   No obstetric history on file.      Home Medications    Prior to Admission medications   Medication Sig Start Date End Date Taking? Authorizing Provider  cephALEXin (KEFLEX) 500 MG capsule Take 1 capsule (500 mg total) by mouth 4 (four) times daily. 01/22/22  Yes Angelly Spearing L, PA  acetaminophen (TYLENOL) 325 MG tablet Take 650 mg by mouth every 6 (six) hours as needed.    [provider]  Ascorbic Acid (VITAMIN C PO) Take 1 tablet by mouth.    [provider]  Cholecalciferol (VITAMIN D PO) Take 1 tablet by mouth daily.     [provider]  ELDERBERRY PO Take by mouth. Mag, zinc, immune system support    [provider]  latanoprost (XALATAN) 0.005 % ophthalmic solution Place 1 drop into both eyes daily. 03/30/20   [provider]  Melatonin 10 MG TABS Take 10 mg by mouth as needed.    [provider]  Misc Natural Products (CHOLESTEROL SUPPORT PO) Take by mouth.    [provider]  Multiple Vitamin (MULTIVITAMIN) tablet Take 1 tablet by mouth daily.    [provider]  Omega-3 Fatty Acids (FISH OIL PO) Take by mouth daily.    [provider]  omeprazole (PRILOSEC) 40 MG capsule TAKE 1 CAPSULE (40 MG TOTAL) BY MOUTH DAILY. 08/29/21   Leonie Man, MD  Probiotic Product (PROBIOTIC DAILY PO) Take 1 tablet by mouth daily.     [provider]    Family History History reviewed. No pertinent family history.  Social History Social History   Tobacco Use   Smoking status: Former   Smokeless tobacco: Never  Substance Use Topics   Alcohol use: No   Drug use: Never     Allergies   Amoxicillin, Metoclopramide hcl, Atorvastatin, Metoclopramide, and Pregabalin   Review of Systems Review of Systems As per HPI  Physical Exam Triage Vital Signs ED Triage Vitals [01/22/22 1139]  Enc Vitals Group     BP (!) 150/76     Pulse Rate 76     Resp 18     Temp 98 F (36.7 C)     Temp Source Oral     SpO2 97 %     Weight      Height      Head Circumference      Peak Flow      Pain Score 0     Pain Loc      Pain Edu?      Excl. in [place]?    No data found.  Updated Vital Signs BP (!) 150/76 (BP Location: Left Arm)   Pulse 76   Temp 98 F (36.7 C) (Oral)   Resp 18   SpO2 97%   Visual Acuity Right Eye Distance:   Left Eye Distance:   Bilateral Distance:    Right Eye Near:   Left Eye Near:    Bilateral Near:     Physical Exam Vitals and nursing  note reviewed.  Constitutional:      General: She is not in acute distress.    Appearance: Normal appearance. She is not ill-appearing, toxic-appearing or diaphoretic.  HENT:     Head: Normocephalic and atraumatic.  Cardiovascular:     Rate and Rhythm: Normal rate.  Pulmonary:     Effort: Pulmonary effort is normal. No respiratory distress.  Musculoskeletal:  General: Swelling (chronic and stable to bilateral lower extremities), tenderness (immediately surrounding wound; tenderness also noted to bilateral feet (chronic per pt)) and signs of injury (V-shaped skin tear to R lateral superior shin, crusting without drainage) present. No deformity. Normal range of motion.     Right lower leg: Edema present.     Left lower leg: Edema present.  Skin:    General: Skin is warm and dry.     Capillary Refill: Capillary refill takes less than 2 seconds.     Findings: Bruising (extensive bruising to R lower leg; chronic scattered bruising across bilateral upper and lower extremities) and erythema (minimal surrounding skin tear) present.  Neurological:     General: No focal deficit present.     Mental Status: She is alert and oriented to person, place, and time.     Sensory: No sensory deficit.        UC Treatments / Results  Labs (all labs ordered are listed, but only abnormal results are displayed) Labs Reviewed - No data to display  EKG   Radiology No results found.  Procedures Procedures (including critical care time)  Medications Ordered in UC Medications - No data to display  Initial Impression / Assessment and Plan / UC Course  I have reviewed the triage vital signs and the nursing notes.  Pertinent labs & imaging results that were available during my care of the patient were reviewed by me and considered in my medical decision making (see chart for details).     R leg injury - pt had Tdap updated 11/29/20. Does not need to be re-updated. Will do short course of abx  to prevent skin infection (cellulitis/ erysipelas). Wound care instructions provided. Hematoma R leg - follow up with PCP. Pt has chronic bruising.  Skin tear - no further skin closure indicated. Healing well. Monitor. As above.  Final Clinical Impressions(s) / UC Diagnoses   Final diagnoses:  Leg injury, right, initial encounter  Hematoma of right lower leg  Noninfected skin tear of right leg, initial encounter     Discharge Instructions      Start using bacitracin ointment over the counter to the wound daily. Use a telfa pad (non-stick) and coban wrap. Change the bandage at least daily. Start taking the antibiotic four times daily until gone. Call your PCP today to schedule a follow up. You may consider a referral to the vascular specialist. Your tetanus vaccination is up to date (11/29/20)  You can use tylenol '500mg'$  up to every 6 hours as needed. Avoid ibuprofen/motrin/aleve/aspirin as this will make the bruising more severe     ED Prescriptions     Medication Sig Dispense Auth. Provider   cephALEXin (KEFLEX) 500 MG capsule  (Status: Discontinued) Take 1 capsule (500 mg total) by mouth 4 (four) times daily for 5 days. 20 capsule Ginna Schuur L, PA   cephALEXin (KEFLEX) 500 MG capsule Take 1 capsule (500 mg total) by mouth 4 (four) times daily. 20 capsule Correen Bubolz L, PA      PDMP not reviewed this encounter.   Chaney Malling, [place] 01/24/22 1551

## 2022-01-22 NOTE — Discharge Instructions (Signed)
Start using bacitracin ointment over the counter to the wound daily. ?Use a telfa pad (non-stick) and coban wrap. Change the bandage at least daily. ?Start taking the antibiotic four times daily until gone. ?Call your PCP today to schedule a follow up. ?You may consider a referral to the vascular specialist. ?Your tetanus vaccination is up to date (11/29/20)  ?You can use tylenol '500mg'$  up to every 6 hours as needed. ?Avoid ibuprofen/motrin/aleve/aspirin as this will make the bruising more severe ?

## 2022-01-28 ENCOUNTER — Other Ambulatory Visit: Payer: Self-pay | Admitting: Cardiology

## 2022-02-07 ENCOUNTER — Telehealth: Payer: Self-pay | Admitting: Cardiology

## 2022-02-07 NOTE — Telephone Encounter (Signed)
Noted.  Patient was made aware if symptoms occur again she would need to be evaluated and possible monitor may be needed.  Patient verbalized understanding, and would call us back.

## 2022-02-07 NOTE — Telephone Encounter (Signed)
Called patient to discuss.  LVM to call back, left call back number.

## 2022-02-07 NOTE — Telephone Encounter (Signed)
Patient called back- she states that she has felt like having a flutter feeling in her chest. It does not last long and it was about 2-3 days ago. Patient is feeling fine now no other symptoms. We did discuss staying hydrated, and watching caffeine intake as these than increase palpitations. Advised patient to monitor and if they came back, continued and did not go away to call us and we could have her seen to be evaluated.  Patient verbalized understanding.  Thankful for call back.

## 2022-02-07 NOTE — Telephone Encounter (Signed)
Patient c/o Palpitations:  High priority if patient c/o lightheadedness, shortness of breath, or chest pain  How long have you had palpitations/irregular HR/ Afib? Are you having the symptoms now?  Patient states she has had flutters occasionally over the past 2-3 days. No symptoms currently.  Are you currently experiencing lightheadedness, SOB or CP?  No   Do you have a history of afib (atrial fibrillation) or irregular heart rhythm?  Not to patient's knowledge  Have you checked your BP or HR? (document readings if available):  Patient states BP/HR are good  Are you experiencing any other symptoms?  No

## 2022-02-14 ENCOUNTER — Encounter: Payer: Self-pay | Admitting: Podiatry

## 2022-02-14 ENCOUNTER — Ambulatory Visit (INDEPENDENT_AMBULATORY_CARE_PROVIDER_SITE_OTHER): Payer: Medicare Other | Admitting: Podiatry

## 2022-02-14 ENCOUNTER — Ambulatory Visit (INDEPENDENT_AMBULATORY_CARE_PROVIDER_SITE_OTHER): Payer: Medicare Other

## 2022-02-14 DIAGNOSIS — G629 Polyneuropathy, unspecified: Secondary | ICD-10-CM | POA: Diagnosis not present

## 2022-02-14 DIAGNOSIS — M7751 Other enthesopathy of right foot: Secondary | ICD-10-CM

## 2022-02-14 MED ORDER — GABAPENTIN 100 MG PO CAPS: 100.0000 mg | ORAL_CAPSULE | Freq: Three times a day (TID) | ORAL | 3 refills | Status: DC

## 2022-02-14 NOTE — Progress Notes (Signed)
Subjective:   Patient ID: Molly Chandler, female   DOB: 87 y.o.   MRN: [id number]   HPI Patient presents with bilateral burning of feet and states that on the right foot the joint has become very sore second and that is really what is bothering her.  Patient just has very sensitive skin was on gabapentin years ago but has not taken it does not smoke likes to be active   Review of Systems  All other systems reviewed and are negative.       Objective:  Physical Exam Vitals and nursing note reviewed.  Constitutional:      Appearance: She is well-developed.  Pulmonary:     Effort: Pulmonary effort is normal.  Musculoskeletal:        General: Normal range of motion.  Skin:    General: Skin is warm.  Neurological:     Mental Status: She is alert.     Neurovascular status found to be moderately diminished both sharp dull vibratory was also diminished with patient having very sensitive skin and has exquisite discomfort around the second metatarsal phalangeal joint right with good digital perfusion well oriented     Assessment:  Inflammatory capsulitis second MPJ right possibility for neuropathic-like symptomatology     Plan:  H&P x-rays reviewed conditions discussed.  At this point I recommended a periarticular injection second MPJ right which was accomplished and we may have to do something else or add a boot to that and I am get a placed on low-dose gabapentin if she did tolerate it well in the past starting 1 at night 100 mg adding 1 to see if it will help the sensitivity she is experiencing.  Reappoint to recheck  X-rays indicate that there is no signs of fracture mild arthritis no other pathology noted forefoot bilateral

## 2022-02-17 DIAGNOSIS — R252 Cramp and spasm: Secondary | ICD-10-CM | POA: Diagnosis not present

## 2022-02-17 DIAGNOSIS — M79601 Pain in right arm: Secondary | ICD-10-CM | POA: Diagnosis not present

## 2022-02-17 DIAGNOSIS — K219 Gastro-esophageal reflux disease without esophagitis: Secondary | ICD-10-CM | POA: Diagnosis not present

## 2022-02-17 DIAGNOSIS — M79602 Pain in left arm: Secondary | ICD-10-CM | POA: Diagnosis not present

## 2022-02-25 DIAGNOSIS — C44319 Basal cell carcinoma of skin of other parts of face: Secondary | ICD-10-CM | POA: Diagnosis not present

## 2022-02-25 DIAGNOSIS — M792 Neuralgia and neuritis, unspecified: Secondary | ICD-10-CM | POA: Diagnosis not present

## 2022-03-06 DIAGNOSIS — M5416 Radiculopathy, lumbar region: Secondary | ICD-10-CM | POA: Diagnosis not present

## 2022-03-06 DIAGNOSIS — M7061 Trochanteric bursitis, right hip: Secondary | ICD-10-CM | POA: Diagnosis not present

## 2022-03-06 DIAGNOSIS — M5136 Other intervertebral disc degeneration, lumbar region: Secondary | ICD-10-CM | POA: Diagnosis not present

## 2022-03-25 DIAGNOSIS — Z08 Encounter for follow-up examination after completed treatment for malignant neoplasm: Secondary | ICD-10-CM | POA: Diagnosis not present

## 2022-03-25 DIAGNOSIS — Z85828 Personal history of other malignant neoplasm of skin: Secondary | ICD-10-CM | POA: Diagnosis not present

## 2022-04-17 ENCOUNTER — Ambulatory Visit: Payer: Self-pay

## 2022-04-17 NOTE — Telephone Encounter (Signed)
  Chief Complaint: widespread rash Symptoms: dry skin red areas that cover arms, legs and on face near cheek, painful when touched  Frequency: 4-6 months  Pertinent Negatives: Patient denies itching  Disposition: '[]'$ ED /'[]'$ Urgent Care (no appt availability in office) / '[]'$ Appointment(In office/virtual)/ '[]'$  [place] [facility]/ '[]'$ Home Care/ '[x]'$ Refused Recommended Disposition /'[]'$ [place] [facility]/ '[x]'$  Follow-up with PCP Additional Notes: pt needing a referral for Neurology per their office. Per insurance she didn't need a referral. Pt didn't want to come in for OV she is just wanting referral placed so she can go to Neurology and figure out what is going on with skin. Pt states its painful to even take a shower and have water touch skin.   Pt called asking for a referral to Neurology.   She has dry skin patches that hurt.  She seen a dermatologist and they told her she needed to see neurology.  She said she called a neurologist and they told her she needs a referral from primary.  Pt lives in [place] and has never been to [facility] only a phone visit.    Pt has [place] and they told her she does not have to have a primary care referral.  She needs advise as how to get an appt scheduled.   CB@  [phone]  Reason for Disposition  Mild widespread rash  (Exception: Heat rash lasting 3 days or less.)  Answer Assessment - Initial Assessment Questions 1. APPEARANCE of RASH: "Describe the rash."      Dry skin red that hurts when touching  2. LOCATION: "Where is the rash located?"      Both arms, both legs, on face near patches  5. ONSET: "When did the rash start?"      4-6 months  6. ITCHING: "Does the rash itch?" If Yes, ask: "How bad is the itch?"  (Scale 0-10; or none, mild, moderate, severe)     no 7. PAIN: "Does the rash hurt?" If Yes, ask: "How bad is the pain?"  (Scale 0-10; or none, mild, moderate, severe)    - NONE (0): no pain    - MILD (1-3): doesn't interfere with normal activities     -  MODERATE (4-7): interferes with normal activities or awakens from sleep     - SEVERE (8-10): excruciating pain, unable to do any normal activities     Moderate to severe. Legs hurt the worse  Protocols used: Rash or Redness - [facility]

## 2022-04-18 NOTE — Telephone Encounter (Signed)
I unfortunately cannot refer her without seeing her first.  She will need to schedule an appointment with me in office.  Even though her insurance does not require a referral, the neurology office is requesting a referral so she will need a referral from a PCP in order to be seen.

## 2022-04-27 DIAGNOSIS — S81811A Laceration without foreign body, right lower leg, initial encounter: Secondary | ICD-10-CM | POA: Diagnosis not present

## 2022-05-19 ENCOUNTER — Telehealth: Payer: Self-pay | Admitting: Cardiology

## 2022-05-19 NOTE — Telephone Encounter (Signed)
LMTCB

## 2022-05-19 NOTE — Telephone Encounter (Signed)
Pt states she keeps having spells where she is very hot, sweaty and weak

## 2022-05-20 ENCOUNTER — Telehealth: Payer: Self-pay | Admitting: Cardiology

## 2022-05-20 NOTE — Telephone Encounter (Signed)
Patient would like to switch from Dr. Ellyn Hack to Dr. Irish Lack.  Are you both in agreement?

## 2022-05-21 NOTE — Telephone Encounter (Signed)
LMTCB

## 2022-05-22 ENCOUNTER — Other Ambulatory Visit: Payer: Self-pay | Admitting: Cardiovascular Disease

## 2022-05-22 DIAGNOSIS — N3020 Other chronic cystitis without hematuria: Secondary | ICD-10-CM | POA: Diagnosis not present

## 2022-05-22 DIAGNOSIS — R8271 Bacteriuria: Secondary | ICD-10-CM | POA: Diagnosis not present

## 2022-05-22 NOTE — Telephone Encounter (Signed)
I have not seen her in a long time.  Had a virtual visit last year in the spring and have not seen her since then.  I bet the reason why she wants the changes because she can get on my schedule.  She just needs to be scheduled to see me which is something I cannot control.  It would make sense if she does not change to stay in the [place] office unless she particular wants to go to the [facility].  Perhaps one of the newer physicians would have openings to see her and could be able to review her case with me.  Unfortunately my availability at [facility] office is not as much as it used to be.  Glenetta Hew, MD

## 2022-05-29 NOTE — Telephone Encounter (Signed)
Left message for pt to call.

## 2022-06-06 NOTE — Telephone Encounter (Signed)
Patient is returning call.  °

## 2022-06-06 NOTE — Telephone Encounter (Signed)
Returned call to patient left message on voice  mail to call back.

## 2022-06-10 DIAGNOSIS — M5136 Other intervertebral disc degeneration, lumbar region: Secondary | ICD-10-CM | POA: Diagnosis not present

## 2022-06-10 DIAGNOSIS — G629 Polyneuropathy, unspecified: Secondary | ICD-10-CM | POA: Diagnosis not present

## 2022-06-10 DIAGNOSIS — M7061 Trochanteric bursitis, right hip: Secondary | ICD-10-CM | POA: Diagnosis not present

## 2022-06-10 DIAGNOSIS — M7918 Myalgia, other site: Secondary | ICD-10-CM | POA: Diagnosis not present

## 2022-06-16 ENCOUNTER — Telehealth: Payer: Self-pay | Admitting: Cardiology

## 2022-06-16 NOTE — Telephone Encounter (Signed)
New Message:       Patient would like to change from Dr Ellyn Hack to Dr Harriet Masson. Is this alright with you both?

## 2022-07-14 DIAGNOSIS — H401111 Primary open-angle glaucoma, right eye, mild stage: Secondary | ICD-10-CM | POA: Diagnosis not present

## 2022-07-14 DIAGNOSIS — Z9842 Cataract extraction status, left eye: Secondary | ICD-10-CM | POA: Diagnosis not present

## 2022-07-14 DIAGNOSIS — H401123 Primary open-angle glaucoma, left eye, severe stage: Secondary | ICD-10-CM | POA: Diagnosis not present

## 2022-07-14 DIAGNOSIS — Z9841 Cataract extraction status, right eye: Secondary | ICD-10-CM | POA: Diagnosis not present

## 2022-07-14 DIAGNOSIS — H52223 Regular astigmatism, bilateral: Secondary | ICD-10-CM | POA: Diagnosis not present

## 2022-07-14 DIAGNOSIS — H59812 Chorioretinal scars after surgery for detachment, left eye: Secondary | ICD-10-CM | POA: Diagnosis not present

## 2022-07-21 ENCOUNTER — Ambulatory Visit: Payer: Medicare Other | Admitting: Diagnostic Neuroimaging

## 2022-07-21 ENCOUNTER — Encounter: Payer: Self-pay | Admitting: Diagnostic Neuroimaging

## 2022-07-21 VITALS — Wt 150.4 lb

## 2022-07-21 DIAGNOSIS — M792 Neuralgia and neuritis, unspecified: Secondary | ICD-10-CM

## 2022-07-21 NOTE — Patient Instructions (Signed)
  BURNING PAIN + RASH (face, lips, arms, chest, legs) - check neuropathy labs - follow up with dermatology re: rash - follow up with pain mgmt for symptoms control

## 2022-07-21 NOTE — Progress Notes (Signed)
[facility]  PATIENT: Molly Chandler DOB: 26-Nov-1934  REFERRING CLINICIAN: Basilia Jumbo Crist Infante* HISTORY FROM: patient REASON FOR VISIT: new consult   HISTORICAL  CHIEF COMPLAINT:  Chief Complaint  Patient presents with   New Patient (Initial Visit)    Pt reports feeling okay. She states she is in pain all over. She states her toes and feet burn and tingle. She uses pain spray for relief. She states feeling tingling, burning, and fluffy sensation on lips and face. Room 7 alone    HISTORY OF PRESENT ILLNESS:   87 year old female here for evaluation of burning pain.  Symptoms started 2 to 3 years ago.  Initially she had burning pain in her in her feet and legs.  Then spread to her arms and chest.  Now having some pain in her face.  Has also developed rash on her face, around her eyes, arms and legs.  Tried gabapentin but this caused side effect.  Now on Cymbalta.   REVIEW OF SYSTEMS: Full 14 system review of systems performed and negative with exception of: As per HPI.  ALLERGIES: Allergies  Allergen Reactions   Amoxicillin Diarrhea and Nausea And Vomiting    GI intolerance.    Metoclopramide Hcl Other (See Comments)    Stroke-like symptoms (dose too high?)   Atorvastatin Other (See Comments)    Pain in extremities, memory issues   Metoclopramide     Other reaction(s): Unknown   Pregabalin     HOME MEDICATIONS: Outpatient Medications Prior to Visit  Medication Sig Dispense Refill   acetaminophen (TYLENOL) 325 MG tablet Take 650 mg by mouth every 6 (six) hours as needed.     Ascorbic Acid (VITAMIN C PO) Take 1 tablet by mouth.     Cholecalciferol (VITAMIN D PO) Take 1 tablet by mouth daily.      ELDERBERRY PO Take by mouth. Mag, zinc, immune system support     latanoprost (XALATAN) 0.005 % ophthalmic solution Place 1 drop into both eyes daily.     Misc Natural Products (CHOLESTEROL SUPPORT PO) Take by mouth.     Multiple Vitamin  (MULTIVITAMIN) tablet Take 1 tablet by mouth daily.     Omega-3 Fatty Acids (FISH OIL PO) Take by mouth daily.     omeprazole (PRILOSEC) 40 MG capsule TAKE 1 CAPSULE (40 MG TOTAL) BY MOUTH DAILY. 90 capsule 3   Probiotic Product (PROBIOTIC DAILY PO) Take 1 tablet by mouth daily.      cephALEXin (KEFLEX) 500 MG capsule Take 1 capsule (500 mg total) by mouth 4 (four) times daily. (Patient not taking: Reported on 07/21/2022) 20 capsule 0   gabapentin (NEURONTIN) 100 MG capsule Take 1 capsule (100 mg total) by mouth 3 (three) times daily. (Patient not taking: Reported on 07/21/2022) 90 capsule 3   Melatonin 10 MG TABS Take 10 mg by mouth as needed. (Patient not taking: Reported on 07/21/2022)     No facility-administered medications prior to visit.    PAST MEDICAL HISTORY: Past Medical History:  Diagnosis Date   Cataract    Coronary artery disease with hx of myocardial infarct w/o hx of CABG 10/05/2018   Presented as anterior STEMI --> Cath revealed 95% distal LM-ostial LAD thrombotic lesion -> initial EF 20 to 25% (LV Gram) significant reduced, IABP => to OR ~2 hours after stabilization (due to emergency OR team already working). -> s/p CABG x 2 (LIMA-LAD, SVG-OM) -> post MI echo EF 50 to 55%.   History of ST  elevation myocardial infarction (STEMI) 10/05/2018   Denies ever having chest pain.  Had nausea vomiting diarrhea ongoing since March.  After significant diarrhea episode had near syncope followed by syncope --> EMS EKG showed anterior ST elevation   Hx of CABG 10/06/2018   Two-vessel CABG:  LIMA-LAD, SVG-OM   Hyperlipidemia with target LDL less than 70 05/31/2019   Lymphoma (HCC)    rectal cancer   Osteoporosis    osteopenia    PAST SURGICAL HISTORY: Past Surgical History:  Procedure Laterality Date   CORONARY ARTERY BYPASS GRAFT N/A 10/05/2018   Procedure: CORONARY ARTERY BYPASS GRAFTING (CABG)x 2, LIMA-LAD, (right leg GSV) rSVG-OM (SVG harvest endoscopically);  Surgeon: Alleen Borne, MD;  Location: [facility] OR;  Service: Cardiothoracic;  Laterality: N/A;   CORONARY/GRAFT ACUTE MI REVASCULARIZATION N/A 10/05/2018   no PCI --> IABP --> EMERGENT CABG X 2   IABP INSERTION N/A 10/05/2018   Procedure: IABP Insertion;  Surgeon: Marykay Lex, MD;  Location: [facility] INVASIVE CV LAB;  Service: Cardiovascular;  Laterality: N/A;   LEFT HEART CATH AND CORONARY ANGIOGRAPHY N/A 10/05/2018   Procedure: LEFT HEART CATH AND CORONARY ANGIOGRAPHY;  Surgeon: Marykay Lex, MD;  Location: [facility] INVASIVE CV LAB; STEMI/SYNCOPE:  Cath -> dLM-OstLAD 95% (thrombotic), ost Cx 50%. Initial EF 25-35% - anterior HK.  2+ MR. Elevated LVEDP --> IABP -> CABG   NM MYOVIEW LTD  11/2019   INTERMEDIATE RISK.  Normal EF 55 to 65%.  Medium size moderate severity fixed defect in the mid anterolateral and apical lateral wall consistent with prior INFARCT.  NO EVIDENCE OF ISCHEMIA   TRANSTHORACIC ECHOCARDIOGRAM  10/11/2018   Post CABG: EF 50-55%. Normal valves.  Abnormal Septal motion (normal post-op).  Normal atrial size.     FAMILY HISTORY: No family history on file.  SOCIAL HISTORY: Social History   Socioeconomic History   Marital status: Widowed    Spouse name: Not on file   Number of children: 5   Years of education: Not on file   Highest education level: Not on file  Occupational History   Occupation: Retired    Associate Professor: RETIRED  Tobacco Use   Smoking status: Former   Smokeless tobacco: Never  Building services engineer Use: Not on file  Substance and Sexual Activity   Alcohol use: No   Drug use: Never   Sexual activity: Not on file  Other Topics Concern   Not on file  Social History Narrative   Desires CPR   Would not want life support prolonged if futile   Social Determinants of Health   Financial Resource Strain: High Risk (11/14/2021)   Overall Financial Resource Strain (CARDIA)    Difficulty of Paying Living Expenses: Hard  Food Insecurity: No Food Insecurity (11/14/2021)   Hunger Vital  Sign    Worried About Running Out of Food in the Last Year: Never true    Ran Out of Food in the Last Year: Never true  Transportation Needs: No Transportation Needs (11/14/2021)   PRAPARE - Administrator, Civil Service (Medical): No    Lack of Transportation (Non-Medical): No  Physical Activity: Insufficiently Active (11/14/2021)   Exercise Vital Sign    Days of Exercise per Week: 2 days    Minutes of Exercise per Session: 30 min  Stress: Stress Concern Present (11/14/2021)   [employer] of Occupational Health - Occupational Stress Questionnaire    Feeling of Stress : Rather much  Social Connections: Moderately  Integrated (11/14/2021)   Social Connection and Isolation Panel [NHANES]    Frequency of Communication with Friends and Family: More than three times a week    Frequency of Social Gatherings with Friends and Family: More than three times a week    Attends Religious Services: 1 to 4 times per year    Active Member of [employer] or Organizations: Yes    Attends Banker Meetings: 1 to 4 times per year    Marital Status: Widowed  Intimate Partner Violence: Not on file     PHYSICAL EXAM  GENERAL EXAM/CONSTITUTIONAL: Vitals:  Vitals:   07/21/22 1303  Weight: 150 lb 6 oz (68.2 kg)   Body mass index is 30.37 kg/m. Wt Readings from Last 3 Encounters:  07/21/22 150 lb 6 oz (68.2 kg)  11/29/20 169 lb (76.7 kg)  11/16/20 165 lb (74.8 kg)   Patient is in no distress; well developed, nourished and groomed; neck is supple  CARDIOVASCULAR: Examination of carotid arteries is normal; no carotid bruits Regular rate and rhythm, no murmurs Examination of peripheral vascular system by observation and palpation is normal  EYES: Ophthalmoscopic exam of optic discs and posterior segments is normal; no papilledema or hemorrhages No results found.  MUSCULOSKELETAL: Gait, strength, tone, movements noted in Neurologic exam below  NEUROLOGIC: MENTAL STATUS:      05/08/2017    8:16 AM  MMSE - Mini Mental State Exam  Orientation to time 5  Orientation to Place 5  Registration 3  Attention/ Calculation 0  Recall 0  Recall-comments pt was unable to recall 0 of 3 words  Language- name 2 objects 0  Language- repeat 1  Language- follow 3 step command 3  Language- read & follow direction 0  Write a sentence 0  Copy design 0  Total score 17   awake, alert, oriented to person, place and time recent and remote memory intact normal attention and concentration language fluent, comprehension intact, naming intact fund of knowledge appropriate  CRANIAL NERVE:  2nd - no papilledema on fundoscopic exam 2nd, 3rd, 4th, 6th - pupils equal and reactive to light, visual fields full to confrontation, extraocular muscles intact, no nystagmus 5th - facial sensation symmetric 7th - facial strength symmetric 8th - hearing intact 9th - palate elevates symmetrically, uvula midline 11th - shoulder shrug symmetric 12th - tongue protrusion midline  MOTOR:  normal bulk and tone, full strength in the BUE, BLE  SENSORY:  normal and symmetric to light touch, temperature, vibration  COORDINATION:  finger-nose-finger, fine finger movements normal  REFLEXES:  deep tendon reflexes TRACE and symmetric  GAIT/STATION:  narrow based gait     DIAGNOSTIC DATA (LABS, IMAGING, TESTING) - I reviewed patient records, labs, notes, testing and imaging myself where available.  Lab Results  Component Value Date   WBC 6.2 11/12/2020   HGB 11.5 (L) 11/12/2020   HCT 34.3 (L) 11/12/2020   MCV 96.0 11/12/2020   PLT 204.0 11/12/2020      Component Value Date/Time   NA 141 11/12/2020 1417   NA 139 07/11/2020 1502   NA 138 08/05/2014 1652   K 4.6 11/12/2020 1417   K 3.7 08/05/2014 1652   CL 106 11/12/2020 1417   CL 104 08/05/2014 1652   CO2 29 11/12/2020 1417   CO2 29 08/05/2014 1652   GLUCOSE 93 11/12/2020 1417   GLUCOSE 89 08/05/2014 1652   BUN 17 11/12/2020  1417   BUN 19 07/11/2020 1502   BUN 19 (H)  08/05/2014 1652   CREATININE 0.69 11/12/2020 1417   CREATININE 0.72 08/05/2014 1652   CREATININE 0.66 08/01/2011 1536   CALCIUM 9.3 11/12/2020 1417   CALCIUM 9.1 08/05/2014 1652   PROT 6.5 06/04/2021 1126   ALBUMIN 4.4 06/04/2021 1126   AST 20 06/04/2021 1126   ALT 10 06/04/2021 1126   ALKPHOS 69 06/04/2021 1126   BILITOT 0.3 06/04/2021 1126   GFRNONAA 75 07/11/2020 1502   GFRNONAA >60 08/05/2014 1652   GFRAA 87 07/11/2020 1502   GFRAA >60 08/05/2014 1652   Lab Results  Component Value Date   CHOL 199 06/04/2021   HDL 45 06/04/2021   LDLCALC 137 (H) 06/04/2021   LDLDIRECT 156.4 05/30/2010   TRIG 92 06/04/2021   CHOLHDL 4.4 06/04/2021   Lab Results  Component Value Date   HGBA1C 5.5 11/12/2020   Lab Results  Component Value Date   VITAMINB12 247 11/12/2020   Lab Results  Component Value Date   TSH 1.40 11/12/2020       ASSESSMENT AND PLAN  87 y.o. year old female here with:   Dx:  1. Neuropathic pain     PLAN:  BURNING PAIN + RASH (face, lips, arms, chest, legs; since ~2020) - check neuropathy labs - follow up with dermatology re: rash - follow up with pain mgmt for symptoms control  Orders Placed This Encounter  Procedures   Vitamin B12   A1c   TSH   SPEP with IFE   ANA w/Reflex   SSA, SSB   HIV   RPR   Hepatitis B surface antibody, qualitative   Hepatitis B surface antigen   Hepatitis B core antibody, total   Hepatitis C antibody   Vitamin B1   Vitamin B6   ANCA Profile   Anti-HU, Anti-RI, Anti-YO IFA   Heavy metals   Return for pending test results, pending if symptoms worsen or fail to improve.    Suanne Marker, MD 07/21/2022, 1:35 PM Certified in Neurology, Neurophysiology and Neuroimaging  [facility] [address], [address] [place], [place] [address] [phone]

## 2022-07-24 ENCOUNTER — Telehealth: Payer: Self-pay

## 2022-07-24 NOTE — Telephone Encounter (Signed)
Contacted patient, left voicemail requesting a call back.

## 2022-07-24 NOTE — Telephone Encounter (Signed)
-----   Message from Penni Bombard, MD sent at 07/24/2022  3:55 PM EST ----- Unremarkable labs. No cause of neuropathy found. -VRP

## 2022-07-25 LAB — ANCA PROFILE
Anti-MPO Antibodies: <0.2 units (ref 0.0–0.9)
Anti-PR3 Antibodies: <0.2 units (ref 0.0–0.9)
Atypical pANCA: <1:20 {titer}
C-ANCA: <1:20 {titer}
P-ANCA: <1:20 {titer}

## 2022-07-25 LAB — ANTI-HU, ANTI-RI, ANTI-YO IFA
Anti-Hu Ab: NEGATIVE
Anti-Ri Ab: NEGATIVE
Anti-Yo Ab: NEGATIVE

## 2022-07-25 LAB — SJOGREN'S SYNDROME ANTIBODS(SSA + SSB)
ENA SSA (RO) Ab: <0.2 AI (ref 0.0–0.9)
ENA SSB (LA) Ab: <0.2 AI (ref 0.0–0.9)

## 2022-07-25 LAB — RPR: RPR Ser Ql: NONREACTIVE

## 2022-07-25 LAB — MULTIPLE MYELOMA PANEL, SERUM
Albumin SerPl Elph-Mcnc: 3.3 g/dL (ref 2.9–4.4)
Albumin/Glob SerPl: 1.3 (ref 0.7–1.7)
Alpha 1: 0.2 g/dL (ref 0.0–0.4)
Alpha2 Glob SerPl Elph-Mcnc: 0.8 g/dL (ref 0.4–1.0)
B-Globulin SerPl Elph-Mcnc: 1 g/dL (ref 0.7–1.3)
Gamma Glob SerPl Elph-Mcnc: 0.6 g/dL (ref 0.4–1.8)
Globulin, Total: 2.6 g/dL (ref 2.2–3.9)
IgA/Immunoglobulin A, Serum: 184 mg/dL (ref 64–422)
IgG (Immunoglobin G), Serum: 583 mg/dL — ABNORMAL LOW (ref 586–1602)
IgM (Immunoglobulin M), Srm: 97 mg/dL (ref 26–217)
Total Protein: 5.9 g/dL — ABNORMAL LOW (ref 6.0–8.5)

## 2022-07-25 LAB — HEMOGLOBIN A1C
Est. average glucose Bld gHb Est-mCnc: 108 mg/dL
Hgb A1c MFr Bld: 5.4 % (ref 4.8–5.6)

## 2022-07-25 LAB — HEPATITIS B SURFACE ANTIBODY,QUALITATIVE: Hep B Surface Ab, Qual: NONREACTIVE

## 2022-07-25 LAB — HEAVY METALS, BLOOD
Arsenic: 2 ug/L (ref 0–9)
Lead, Blood: <1 ug/dL (ref 0.0–3.4)
Mercury: <1 ug/L (ref 0.0–14.9)

## 2022-07-25 LAB — VITAMIN B6: Vitamin B6: 31.3 ug/L (ref 3.4–65.2)

## 2022-07-25 LAB — HEPATITIS B CORE ANTIBODY, TOTAL: Hep B Core Total Ab: NEGATIVE

## 2022-07-25 LAB — VITAMIN B1: Thiamine: 122.5 nmol/L (ref 66.5–200.0)

## 2022-07-25 LAB — HEPATITIS C ANTIBODY: Hep C Virus Ab: NONREACTIVE

## 2022-07-25 LAB — HEPATITIS B SURFACE ANTIGEN: Hepatitis B Surface Ag: NEGATIVE

## 2022-07-25 LAB — ANA W/REFLEX: ANA Titer 1: NEGATIVE

## 2022-07-25 LAB — VITAMIN B12: Vitamin B-12: 643 pg/mL (ref 232–1245)

## 2022-07-25 LAB — TSH: TSH: 1.97 u[IU]/mL (ref 0.450–4.500)

## 2022-07-25 LAB — HIV ANTIBODY (ROUTINE TESTING W REFLEX): HIV Screen 4th Generation wRfx: NONREACTIVE

## 2022-07-28 NOTE — Telephone Encounter (Signed)
Pt called back. Informed her that what nurse Myriam Jacobson sent in teams: would you please advise her that we were calling to let her know that Dr Leta Baptist reviewed her labs and they came back in normal range. Informed pt to f/u with "BURNING PAIN + RASH (face, lips, arms, chest, legs) - check neuropathy labs - follow up with dermatology re: rash - follow up with pain mgmt for symptoms control"  Pt asked if a referral was sent to  pain management. Informed pt to f/u with PCP for a pain management referral.  Pt said okay and thank you.

## 2022-08-24 DIAGNOSIS — I219 Acute myocardial infarction, unspecified: Secondary | ICD-10-CM | POA: Diagnosis not present

## 2022-08-24 DIAGNOSIS — S22069A Unspecified fracture of T7-T8 vertebra, initial encounter for closed fracture: Secondary | ICD-10-CM | POA: Diagnosis not present

## 2022-08-24 DIAGNOSIS — M5414 Radiculopathy, thoracic region: Secondary | ICD-10-CM | POA: Diagnosis not present

## 2022-08-24 DIAGNOSIS — S22060A Wedge compression fracture of T7-T8 vertebra, initial encounter for closed fracture: Secondary | ICD-10-CM | POA: Diagnosis not present

## 2022-08-24 DIAGNOSIS — R9431 Abnormal electrocardiogram [ECG] [EKG]: Secondary | ICD-10-CM | POA: Diagnosis not present

## 2022-08-24 DIAGNOSIS — R079 Chest pain, unspecified: Secondary | ICD-10-CM | POA: Diagnosis not present

## 2022-08-24 DIAGNOSIS — M549 Dorsalgia, unspecified: Secondary | ICD-10-CM | POA: Diagnosis not present

## 2022-09-11 DIAGNOSIS — G629 Polyneuropathy, unspecified: Secondary | ICD-10-CM | POA: Diagnosis not present

## 2022-09-11 DIAGNOSIS — M7918 Myalgia, other site: Secondary | ICD-10-CM | POA: Diagnosis not present

## 2022-09-11 DIAGNOSIS — M7061 Trochanteric bursitis, right hip: Secondary | ICD-10-CM | POA: Diagnosis not present

## 2022-10-22 DIAGNOSIS — Z136 Encounter for screening for cardiovascular disorders: Secondary | ICD-10-CM | POA: Diagnosis not present

## 2022-10-22 DIAGNOSIS — Z13220 Encounter for screening for lipoid disorders: Secondary | ICD-10-CM | POA: Diagnosis not present

## 2022-10-22 DIAGNOSIS — R7309 Other abnormal glucose: Secondary | ICD-10-CM | POA: Diagnosis not present

## 2022-10-22 DIAGNOSIS — D519 Vitamin B12 deficiency anemia, unspecified: Secondary | ICD-10-CM | POA: Diagnosis not present

## 2022-10-22 DIAGNOSIS — E559 Vitamin D deficiency, unspecified: Secondary | ICD-10-CM | POA: Diagnosis not present

## 2022-10-22 DIAGNOSIS — R0602 Shortness of breath: Secondary | ICD-10-CM | POA: Diagnosis not present

## 2022-10-22 DIAGNOSIS — R252 Cramp and spasm: Secondary | ICD-10-CM | POA: Diagnosis not present

## 2022-10-22 DIAGNOSIS — R609 Edema, unspecified: Secondary | ICD-10-CM | POA: Diagnosis not present

## 2022-10-22 DIAGNOSIS — L0390 Cellulitis, unspecified: Secondary | ICD-10-CM | POA: Diagnosis not present

## 2022-10-22 DIAGNOSIS — R21 Rash and other nonspecific skin eruption: Secondary | ICD-10-CM | POA: Diagnosis not present

## 2022-10-22 DIAGNOSIS — R030 Elevated blood-pressure reading, without diagnosis of hypertension: Secondary | ICD-10-CM | POA: Diagnosis not present

## 2022-10-22 DIAGNOSIS — Z13228 Encounter for screening for other metabolic disorders: Secondary | ICD-10-CM | POA: Diagnosis not present

## 2022-10-31 ENCOUNTER — Telehealth: Payer: Self-pay | Admitting: Internal Medicine

## 2022-10-31 NOTE — Telephone Encounter (Signed)
Molly Chandler to schedule their annual wellness visit. Patient declined to schedule AWV at this time. No longer patient of [facility] per patient's daughter  Molly Chandler; [place] Direct Dial: [phone]

## 2022-11-04 ENCOUNTER — Encounter: Payer: Self-pay | Admitting: Cardiology

## 2022-11-04 ENCOUNTER — Encounter: Payer: Self-pay | Admitting: *Deleted

## 2022-11-04 DIAGNOSIS — K449 Diaphragmatic hernia without obstruction or gangrene: Secondary | ICD-10-CM | POA: Insufficient documentation

## 2022-11-04 DIAGNOSIS — I8310 Varicose veins of unspecified lower extremity with inflammation: Secondary | ICD-10-CM | POA: Diagnosis not present

## 2022-11-04 DIAGNOSIS — R1310 Dysphagia, unspecified: Secondary | ICD-10-CM | POA: Insufficient documentation

## 2022-11-04 DIAGNOSIS — K5901 Slow transit constipation: Secondary | ICD-10-CM

## 2022-11-04 DIAGNOSIS — R933 Abnormal findings on diagnostic imaging of other parts of digestive tract: Secondary | ICD-10-CM | POA: Insufficient documentation

## 2022-11-04 DIAGNOSIS — L299 Pruritus, unspecified: Secondary | ICD-10-CM | POA: Diagnosis not present

## 2022-11-04 DIAGNOSIS — R233 Spontaneous ecchymoses: Secondary | ICD-10-CM | POA: Diagnosis not present

## 2022-11-04 HISTORY — DX: Abnormal findings on diagnostic imaging of other parts of digestive tract: R93.3

## 2022-11-04 HISTORY — DX: Morbid (severe) obesity due to excess calories: E66.01

## 2022-11-04 HISTORY — DX: Diaphragmatic hernia without obstruction or gangrene: K44.9

## 2022-11-04 HISTORY — DX: Slow transit constipation: K59.01

## 2022-11-04 HISTORY — DX: Dysphagia, unspecified: R13.10

## 2022-11-06 DIAGNOSIS — R519 Headache, unspecified: Secondary | ICD-10-CM | POA: Diagnosis not present

## 2022-11-06 DIAGNOSIS — I6523 Occlusion and stenosis of bilateral carotid arteries: Secondary | ICD-10-CM | POA: Diagnosis not present

## 2022-11-06 DIAGNOSIS — I10 Essential (primary) hypertension: Secondary | ICD-10-CM | POA: Diagnosis not present

## 2022-11-06 DIAGNOSIS — G4452 New daily persistent headache (NDPH): Secondary | ICD-10-CM | POA: Diagnosis not present

## 2022-11-06 DIAGNOSIS — M542 Cervicalgia: Secondary | ICD-10-CM | POA: Diagnosis not present

## 2022-11-21 ENCOUNTER — Ambulatory Visit: Payer: Medicare Other | Attending: Cardiology | Admitting: Cardiology

## 2022-11-21 ENCOUNTER — Encounter: Payer: Self-pay | Admitting: Cardiology

## 2022-11-21 VITALS — BP 108/68 | HR 83 | Ht 59.0 in | Wt 152.8 lb

## 2022-11-21 DIAGNOSIS — E785 Hyperlipidemia, unspecified: Secondary | ICD-10-CM | POA: Diagnosis not present

## 2022-11-21 DIAGNOSIS — Z951 Presence of aortocoronary bypass graft: Secondary | ICD-10-CM | POA: Diagnosis not present

## 2022-11-21 DIAGNOSIS — I252 Old myocardial infarction: Secondary | ICD-10-CM | POA: Diagnosis not present

## 2022-11-21 DIAGNOSIS — I2510 Atherosclerotic heart disease of native coronary artery without angina pectoris: Secondary | ICD-10-CM

## 2022-11-21 DIAGNOSIS — R0609 Other forms of dyspnea: Secondary | ICD-10-CM | POA: Diagnosis not present

## 2022-11-21 DIAGNOSIS — R079 Chest pain, unspecified: Secondary | ICD-10-CM | POA: Diagnosis not present

## 2022-11-21 MED ORDER — ASPIRIN 81 MG PO TBEC: 81.0000 mg | DELAYED_RELEASE_TABLET | Freq: Every day | ORAL | 3 refills | Status: DC

## 2022-11-21 MED ORDER — RANOLAZINE ER 500 MG PO TB12: 500.0000 mg | ORAL_TABLET | Freq: Two times a day (BID) | ORAL | 12 refills | Status: DC

## 2022-11-21 NOTE — Patient Instructions (Signed)
Medication Instructions:  Your physician has recommended you make the following change in your medication:   Start Ranexa 500 mg twice daily.  Start Aspirin 81 mg coated daily.  *If you need a refill on your cardiac medications before your next appointment, please call your pharmacy*   Lab Work: None ordered If you have labs (blood work) drawn today and your tests are completely normal, you will receive your results only by: [place] (if you have MyChart) OR A paper copy in the mail If you have any lab test that is abnormal or we need to change your treatment, we will call you to review the results.   Testing/Procedures: You are scheduled for a Myocardial Perfusion Imaging Study.  Please arrive 15 minutes prior to your appointment time for registration and insurance purposes.  The test will take approximately 3 to 4 hours to complete; you may bring reading material.  If someone comes with you to your appointment, they will need to remain in the main lobby due to limited space in the testing area.   How to prepare for your Myocardial Perfusion Test: Do not eat or drink 3 hours prior to your test, except you may have water. Do not consume products containing caffeine (regular or decaffeinated) 12 hours prior to your test. (ex: coffee, chocolate, sodas, tea). Do bring a list of your current medications with you.  If not listed below, you may take your medications as normal. Do wear comfortable clothes (no dresses or overalls) and walking shoes, tennis shoes preferred (No heels or open toe shoes are allowed). Do NOT wear cologne, perfume, aftershave, or lotions (deodorant is allowed). If these instructions are not followed, your test will have to be rescheduled.  If you cannot keep your appointment, please provide 24 hours notification to the Nuclear Lab, to avoid a possible $50 charge to your account.   Your physician has requested that you have an echocardiogram.  Echocardiography is a painless test that uses sound waves to create images of your heart. It provides your doctor with information about the size and shape of your heart and how well your heart's chambers and valves are working. This procedure takes approximately one hour. There are no restrictions for this procedure. Please do NOT wear cologne, perfume, aftershave, or lotions (deodorant is allowed). Please arrive 15 minutes prior to your appointment time.   Follow-Up: At [facility], you and your health needs are our priority.  As part of our continuing mission to provide you with exceptional heart care, we have created designated Provider Care Teams.  These Care Teams include your primary Cardiologist (physician) and Advanced Practice Providers (APPs -  Physician Assistants and Nurse Practitioners) who all work together to provide you with the care you need, when you need it.  We recommend signing up for the patient portal called "MyChart".  Sign up information is provided on this After Visit Summary.  MyChart is used to connect with patients for Virtual Visits (Telemedicine).  Patients are able to view lab/test results, encounter notes, upcoming appointments, etc.  Non-urgent messages can be sent to your provider as well.   To learn more about what you can do with MyChart, go to [email or web address].    Your next appointment:   1 month(s)  Provider:   Jenne Campus, MD   Other Instructions  Cardiac Nuclear Scan A cardiac nuclear scan is a test that is done to check the flow of blood to your heart. It is done when  you are resting and when you are exercising. The test looks for problems such as: Not enough blood reaching a portion of the heart. The heart muscle not working as it should. You may need this test if you have: Heart disease. Lab results that are not normal. Had heart surgery or a balloon procedure to open up blocked arteries (angioplasty) or a small mesh tube  (stent). Chest pain. Shortness of breath. Had a heart attack. In this test, a special dye (tracer) is put into your bloodstream. The tracer will travel to your heart. A camera will then take pictures of your heart to see how the tracer moves through your heart. This test is usually done at a hospital and takes 2-4 hours. Tell a doctor about: Any allergies you have. All medicines you are taking, including vitamins, herbs, eye drops, creams, and over-the-counter medicines. Any bleeding problems you have. Any surgeries you have had. Any medical conditions you have. Whether you are pregnant or may be pregnant. Any history of asthma or long-term (chronic) lung disease. Any history of heart rhythm disorders or heart valve conditions. What are the risks? Your doctor will talk with you about risks. These may include: Serious chest pain and heart attack. This is only a risk if the stress portion of the test is done. Fast or uneven heartbeats (palpitations). A feeling of warmth in your chest. This feeling usually does not last long. Allergic reaction to the tracer. Shortness of breath or trouble breathing. What happens before the test? Ask your doctor about changing or stopping your normal medicines. Follow instructions from your doctor about what you cannot eat or drink. Remove your jewelry on the day of the test. Ask your doctor if you need to avoid nicotine or caffeine. What happens during the test? An IV tube will be inserted into one of your veins. Your doctor will give you a small amount of tracer through the IV tube. You will wait for 20-40 minutes while the tracer moves through your bloodstream. Your heart will be monitored with an electrocardiogram (ECG). You will lie down on an exam table. Pictures of your heart will be taken for about 15-20 minutes. You may also have a stress test. For this test, one of these things may be done: You will be asked to exercise on a treadmill or a  stationary bike. You will be given medicines that will make your heart work harder. This is done if you are unable to exercise. When blood flow to your heart has peaked, a tracer will again be given through the IV tube. After 20-40 minutes, you will get back on the exam table. More pictures will be taken of your heart. Depending on the tracer that is used, more pictures may need to be taken 3-4 hours later. Your IV tube will be removed when the test is over. The test may vary among doctors and hospitals. What happens after the test? Ask your doctor: Whether you can return to your normal schedule, including diet, activities, travel, and medicines. Whether you should drink more fluids. This will help to remove the tracer from your body. Ask your doctor, or the department that is doing the test: When will my results be ready? How will I get my results? What are my treatment options? What other tests do I need? What are my next steps? This information is not intended to replace advice given to you by your health care provider. Make sure you discuss any questions you have with  your health care provider. Document Revised: 01/21/2022 Document Reviewed: 01/21/2022 Elsevier Patient Education  [place].  Echocardiogram An echocardiogram is a test that uses sound waves (ultrasound) to produce images of the heart. Images from an echocardiogram can provide important information about: Heart size and shape. The size and thickness and movement of your heart's walls. Heart muscle function and strength. Heart valve function or if you have stenosis. Stenosis is when the heart valves are too narrow. If blood is flowing backward through the heart valves (regurgitation). A tumor or infectious growth around the heart valves. Areas of heart muscle that are not working well because of poor blood flow or injury from a heart attack. Aneurysm detection. An aneurysm is a weak or damaged part of an  artery wall. The wall bulges out from the normal force of blood pumping through the body. Tell a health care provider about: Any allergies you have. All medicines you are taking, including vitamins, herbs, eye drops, creams, and over-the-counter medicines. Any blood disorders you have. Any surgeries you have had. Any medical conditions you have. Whether you are pregnant or may be pregnant. What are the risks? Generally, this is a safe test. However, problems may occur, including an allergic reaction to dye (contrast) that may be used during the test. What happens before the test? No specific preparation is needed. You may eat and drink normally. What happens during the test?  You will take off your clothes from the waist up and put on a hospital gown. Electrodes or electrocardiogram (ECG)patches may be placed on your chest. The electrodes or patches are then connected to a device that monitors your heart rate and rhythm. You will lie down on a table for an ultrasound exam. A gel will be applied to your chest to help sound waves pass through your skin. A handheld device, called a transducer, will be pressed against your chest and moved over your heart. The transducer produces sound waves that travel to your heart and bounce back (or "echo" back) to the transducer. These sound waves will be captured in real-time and changed into images of your heart that can be viewed on a video monitor. The images will be recorded on a computer and reviewed by your health care provider. You may be asked to change positions or hold your breath for a short time. This makes it easier to get different views or better views of your heart. In some cases, you may receive contrast through an IV in one of your veins. This can improve the quality of the pictures from your heart. The procedure may vary among health care providers and hospitals. What can I expect after the test? You may return to your normal, everyday life,  including diet, activities, and medicines, unless your health care provider tells you not to do that. Follow these instructions at home: It is up to you to get the results of your test. Ask your health care provider, or the department that is doing the test, when your results will be ready. Keep all follow-up visits. This is important. Summary An echocardiogram is a test that uses sound waves (ultrasound) to produce images of the heart. Images from an echocardiogram can provide important information about the size and shape of your heart, heart muscle function, heart valve function, and other possible heart problems. You do not need to do anything to prepare before this test. You may eat and drink normally. After the echocardiogram is completed, you may return to  your normal, everyday life, unless your health care provider tells you not to do that. This information is not intended to replace advice given to you by your health care provider. Make sure you discuss any questions you have with your health care provider. Document Revised: 05/08/2021 Document Reviewed: 04/17/2020 Elsevier Patient Education  [place].

## 2022-11-21 NOTE — Progress Notes (Unsigned)
Cardiology Office Note:    Date:  11/21/2022   ID:  Molly Chandler, DOB 06/06/1935, MRN [id number]  PCP:  Lars Mage, NP  Cardiologist:  Gypsy Balsam, MD    Referring MD: Lars Mage, NP   Chief Complaint  Patient presents with   closed to syncope   Headache    Ongoing for 2 weeks     History of Present Illness:    Molly Chandler is a 88 y.o. female past medical history significant for coronary artery disease.  In October 04 2018 she did have acute STEMI cardiac catheterization has been done she was find to have distal left main ostial LAD 95% thrombotic lesion ostial circumflex was 50%, initial ejection fraction 25 to 35% anterior hypokinesis moderate MR elevated LVEDP she was rushed to the operating room she had coronary bypass graft done with LIMA to LAD SVG to obtuse marginal branch.  After that she had recovery of left ventricle ejection fraction to 50 to 55% she has been doing well since that time.  She comes today because she have some strength sensation she said when she stands for some time or try to walk she will get pain in her back of her head and neck.  When she sit down the sensation gets better.  She does have any chest pain tightness squeezing pressure burning chest.  Interestingly in 2020 when she had her myocardial infarction her symptomatology was GI upset.  So she clearly does not have any typical symptoms.  She is trying to be active but this sensation she gets in her neck really bothers her a lot.  There is no shortness of breath there is no sweating associated with this sensation  Past Medical History:  Diagnosis Date   Cataract    Coronary artery disease with hx of myocardial infarct w/o hx of CABG 10/05/2018   Presented as anterior STEMI --> Cath revealed 95% distal LM-ostial LAD thrombotic lesion -> initial EF 20 to 25% (LV Gram) significant reduced, IABP => to OR ~2 hours after stabilization (due to emergency OR team already working). ->  s/p CABG x 2 (LIMA-LAD, SVG-OM) -> post MI echo EF 50 to 55%.   Degeneration of lumbar intervertebral disc 08/06/2015   Diaphragmatic hernia 11/04/2022   GERD (gastroesophageal reflux disease) 09/26/2011   History of ST elevation myocardial infarction (STEMI) 10/05/2018   Denies ever having chest pain.  Had nausea vomiting diarrhea ongoing since March.  After significant diarrhea episode had near syncope followed by syncope --> EMS EKG showed anterior ST elevation   Hx of CABG 10/06/2018   Two-vessel CABG:  LIMA-LAD, SVG-OM   Hyperlipidemia with target LDL less than 70 05/31/2019   Lymphoma (HCC)    rectal cancer   Osteoarthritis 02/05/2018   Osteoporosis    osteopenia   Polyneuropathy 04/29/2017   Slow transit constipation 11/04/2022   Vitamin D deficiency     Past Surgical History:  Procedure Laterality Date   CORONARY ARTERY BYPASS GRAFT N/A 10/05/2018   Procedure: CORONARY ARTERY BYPASS GRAFTING (CABG)x 2, LIMA-LAD, (right leg GSV) rSVG-OM (SVG harvest endoscopically);  Surgeon: Alleen Borne, MD;  Location: [facility] OR;  Service: Cardiothoracic;  Laterality: N/A;   CORONARY/GRAFT ACUTE MI REVASCULARIZATION N/A 10/05/2018   no PCI --> IABP --> EMERGENT CABG X 2   IABP INSERTION N/A 10/05/2018   Procedure: IABP Insertion;  Surgeon: Marykay Lex, MD;  Location: [facility] INVASIVE CV LAB;  Service: Cardiovascular;  Laterality: N/A;  LEFT HEART CATH AND CORONARY ANGIOGRAPHY N/A 10/05/2018   Procedure: LEFT HEART CATH AND CORONARY ANGIOGRAPHY;  Surgeon: Marykay Lex, MD;  Location: [facility] INVASIVE CV LAB; STEMI/SYNCOPE:  Cath -> dLM-OstLAD 95% (thrombotic), ost Cx 50%. Initial EF 25-35% - anterior HK.  2+ MR. Elevated LVEDP --> IABP -> CABG   NM MYOVIEW LTD  11/2019   INTERMEDIATE RISK.  Normal EF 55 to 65%.  Medium size moderate severity fixed defect in the mid anterolateral and apical lateral wall consistent with prior INFARCT.  NO EVIDENCE OF ISCHEMIA   TRANSTHORACIC ECHOCARDIOGRAM  10/11/2018    Post CABG: EF 50-55%. Normal valves.  Abnormal Septal motion (normal post-op).  Normal atrial size.     Current Medications: Current Meds  Medication Sig   acetaminophen (TYLENOL) 325 MG tablet Take 650 mg by mouth every 6 (six) hours as needed for mild pain or moderate pain.   Ascorbic Acid (VITAMIN C) 1000 MG tablet Take 1,000 mg by mouth daily.   cholecalciferol (VITAMIN D3) 25 MCG (1000 UNIT) tablet Take 1,000 Units by mouth daily.   cyanocobalamin (VITAMIN B12) 1000 MCG tablet Take 1,000 mcg by mouth daily.   latanoprost (XALATAN) 0.005 % ophthalmic solution Place 1 drop into both eyes daily.   lisinopril (ZESTRIL) 10 MG tablet Take 10 mg by mouth daily.   Misc Natural Products (CHOLESTEROL SUPPORT PO) Take 1 tablet by mouth daily.   Multiple Vitamin (MULTIVITAMIN) tablet Take 1 tablet by mouth daily.   Omega-3 Fatty Acids (FISH OIL) 1000 MG CAPS Take 1,000 mg by mouth daily.   omeprazole (PRILOSEC) 40 MG capsule Take 40 mg by mouth daily.   Probiotic Product (PROBIOTIC DAILY PO) Take 1 tablet by mouth daily.    rosuvastatin (CRESTOR) 20 MG tablet Take 20 mg by mouth daily.   Vitamin D, Ergocalciferol, (DRISDOL) 1.25 MG (50000 UNIT) CAPS capsule Take 50,000 Units by mouth every 7 (seven) days.     Allergies:   Amoxicillin, Metoclopramide hcl, Atorvastatin, Metoclopramide, and Pregabalin   Social History   Socioeconomic History   Marital status: Widowed    Spouse name: Not on file   Number of children: 5   Years of education: Not on file   Highest education level: Not on file  Occupational History   Occupation: Retired    Associate Professor: RETIRED  Tobacco Use   Smoking status: Former   Smokeless tobacco: Never  Building services engineer Use: Never used  Substance and Sexual Activity   Alcohol use: No   Drug use: Never   Sexual activity: Not on file  Other Topics Concern   Not on file  Social History Narrative   Desires CPR   Would not want life support prolonged if futile    Social Determinants of Health   Financial Resource Strain: High Risk (11/14/2021)   Overall Financial Resource Strain (CARDIA)    Difficulty of Paying Living Expenses: Hard  Food Insecurity: No Food Insecurity (11/14/2021)   Hunger Vital Sign    Worried About Running Out of Food in the Last Year: Never true    Ran Out of Food in the Last Year: Never true  Transportation Needs: No Transportation Needs (11/14/2021)   PRAPARE - Administrator, Civil Service (Medical): No    Lack of Transportation (Non-Medical): No  Physical Activity: Insufficiently Active (11/14/2021)   Exercise Vital Sign    Days of Exercise per Week: 2 days    Minutes of Exercise per Session: 30  min  Stress: Stress Concern Present (11/14/2021)   [employer] of Occupational Health - Occupational Stress Questionnaire    Feeling of Stress : Rather much  Social Connections: Moderately Integrated (11/14/2021)   Social Connection and Isolation Panel [NHANES]    Frequency of Communication with Friends and Family: More than three times a week    Frequency of Social Gatherings with Friends and Family: More than three times a week    Attends Religious Services: 1 to 4 times per year    Active Member of [employer] or Organizations: Yes    Attends Banker Meetings: 1 to 4 times per year    Marital Status: Widowed     Family History: The patient's family history includes Dementia in her father; Kidney disease in her mother. ROS:   Please see the history of present illness.    All 14 point review of systems negative except as described per history of present illness  EKGs/Labs/Other Studies Reviewed:      Recent Labs: 07/21/2022: TSH 1.970  Recent Lipid Panel    Component Value Date/Time   CHOL 199 06/04/2021 1125   TRIG 92 06/04/2021 1125   HDL 45 06/04/2021 1125   CHOLHDL 4.4 06/04/2021 1125   CHOLHDL 3.8 10/05/2018 0708   VLDL 4 10/05/2018 0708   LDLCALC 137 (H) 06/04/2021 1125   LDLDIRECT  156.4 05/30/2010 1501    Physical Exam:    VS:  BP 108/68 (BP Location: Left Arm, Patient Position: Sitting)   Pulse 83   Ht 4\' 11"  (1.499 m)   Wt 152 lb 12.8 oz (69.3 kg)   SpO2 98%   BMI 30.86 kg/m     Wt Readings from Last 3 Encounters:  11/21/22 152 lb 12.8 oz (69.3 kg)  10/22/22 130 lb (59 kg)  07/21/22 150 lb 6 oz (68.2 kg)     GEN:  Well nourished, well developed in no acute distress HEENT: Normal NECK: No JVD; No carotid bruits LYMPHATICS: No lymphadenopathy CARDIAC: RRR, no murmurs, no rubs, no gallops RESPIRATORY:  Clear to auscultation without rales, wheezing or rhonchi  ABDOMEN: Soft, non-tender, non-distended MUSCULOSKELETAL:  No edema; No deformity  SKIN: Warm and dry LOWER EXTREMITIES: no swelling NEUROLOGIC:  Alert and oriented x 3 PSYCHIATRIC:  Normal affect   ASSESSMENT:    1. Coronary artery disease with hx of myocardial infarct w/o hx of CABG   2. S/P CABG x 2   3. Hyperlipidemia with target LDL less than 70    PLAN:    In order of problems listed above:  Coronary disease status post coronary bypass graft.  She presented today with very atypical symptomatology, her EKG is perfectly normal.  I discovered that she has not been taking aspirin for years because she was told not I asked her to start taking 1 baby aspirin every single day.  I will schedule her to have Lexiscan.  I will give her ranolazine 500 mg twice daily with caution because of history of constipation I warned her about the fact that this medication can give her constipation she use MiraLAX on the regular basis I advised her to continue.  I will also schedule her to have echocardiogram to assess left ventricle ejection fraction.  I told her if cardiac workup will be negative we may need to look at her spine probably with MRI since she does have history of some spine issues which gave her a lot of pain. Dyslipidemia I will schedule her to have  fasting lipid profile. We did talk about  healthy lifestyle need to exercise on the regular basis which right now is problematic because of neck pain.  Interestingly her neck pain is not worse with moving her shoulder slightly tender on palpations on the back of her neck   Medication Adjustments/Labs and Tests Ordered: Current medicines are reviewed at length with the patient today.  Concerns regarding medicines are outlined above.  No orders of the defined types were placed in this encounter.  Medication changes: No orders of the defined types were placed in this encounter.   Signed, Georgeanna Lea, MD, [facility] 11/21/2022 10:23 AM    [facility]

## 2022-11-25 ENCOUNTER — Telehealth (HOSPITAL_COMMUNITY): Payer: Self-pay | Admitting: *Deleted

## 2022-11-25 NOTE — Telephone Encounter (Signed)
Patient 's daughter, per dpr, given detailed instructions per Myocardial Perfusion Study Information Sheet for the test on 11/26/22 Patient notified to arrive 15 minutes early and that it is imperative to arrive on time for appointment to keep from having the test rescheduled.  If you need to cancel or reschedule your appointment, please call the office within 24 hours of your appointment. . Patient verbalized understanding. Kirstie Peri

## 2022-11-26 ENCOUNTER — Ambulatory Visit: Payer: Medicare Other | Attending: Cardiology

## 2022-11-26 DIAGNOSIS — I2510 Atherosclerotic heart disease of native coronary artery without angina pectoris: Secondary | ICD-10-CM

## 2022-11-26 DIAGNOSIS — I252 Old myocardial infarction: Secondary | ICD-10-CM | POA: Diagnosis not present

## 2022-11-26 DIAGNOSIS — Z951 Presence of aortocoronary bypass graft: Secondary | ICD-10-CM | POA: Diagnosis not present

## 2022-11-26 DIAGNOSIS — R0609 Other forms of dyspnea: Secondary | ICD-10-CM | POA: Diagnosis not present

## 2022-11-26 DIAGNOSIS — R079 Chest pain, unspecified: Secondary | ICD-10-CM | POA: Diagnosis not present

## 2022-11-26 LAB — MYOCARDIAL PERFUSION IMAGING
LV dias vol: 65 mL (ref 46–106)
LV sys vol: 25 mL
Nuc Stress EF: 62 %
Peak HR: 97 {beats}/min
Rest HR: 66 {beats}/min
Rest Nuclear Isotope Dose: 11 mCi
SDS: 1
SRS: 4
SSS: 5
ST Depression (mm): 0 mm
Stress Nuclear Isotope Dose: 30.1 mCi
TID: 1.12

## 2022-11-26 MED ORDER — REGADENOSON 0.4 MG/5ML IV SOLN
0.4000 mg | Freq: Once | INTRAVENOUS | Status: AC
  Administered 2022-11-26: 0.4 mg via INTRAVENOUS

## 2022-11-26 MED ORDER — TECHNETIUM TC 99M TETROFOSMIN IV KIT
11.0000 | PACK | Freq: Once | INTRAVENOUS | Status: AC | PRN
  Administered 2022-11-26: 11 via INTRAVENOUS

## 2022-11-26 MED ORDER — TECHNETIUM TC 99M TETROFOSMIN IV KIT
30.1000 | PACK | Freq: Once | INTRAVENOUS | Status: AC | PRN
  Administered 2022-11-26: 30.1 via INTRAVENOUS

## 2022-11-27 ENCOUNTER — Telehealth: Payer: Self-pay | Admitting: Cardiology

## 2022-11-27 NOTE — Telephone Encounter (Signed)
Daughter stated patient started Ranexa last week and has been feeling sick in her stomach since starting the medication.  Daughter stated patient has been unable to eat, is very nauseous and feels weak. She would like to know if there is something else that can be prescribed instead of Ranexa?

## 2022-11-27 NOTE — Telephone Encounter (Signed)
Pt c/o medication issue:  1. Name of Medication:   ranolazine (RANEXA) 500 MG 12 hr tablet   2. How are you currently taking this medication (dosage and times per day)?   As prescribed  3. Are you having a reaction (difficulty breathing--STAT)?   No  4. What is your medication issue?   Daughter stated patient started this medication last week and has been feeling sick in her stomach.  Daughter stated patient has been unable to eat, is very nauseous and feels weak.

## 2022-11-28 ENCOUNTER — Telehealth: Payer: Self-pay

## 2022-11-28 ENCOUNTER — Other Ambulatory Visit: Payer: Self-pay

## 2022-11-28 MED ORDER — ISOSORBIDE MONONITRATE ER 30 MG PO TB24: 30.0000 mg | ORAL_TABLET | Freq: Every day | ORAL | 3 refills | Status: DC

## 2022-11-28 NOTE — Telephone Encounter (Signed)
Results reviewed with pt's daughter Tommi Emery- per DPR- as per Dr. Wendy Poet note.  Daughter verbalized understanding and had no additional questions. Routed to PCP

## 2022-11-28 NOTE — Telephone Encounter (Signed)
Called the patient's daughter and informed her of Dr. Wendy Poet recommendation below:  "Please stop ranolazine, please put her on Imdur 30 mg daily warned her about potential side effect of headache"   I warned her about the side effects of this medication. Patient's daughter was agreeable with this change and had no further questions at this time.

## 2022-12-02 DIAGNOSIS — L853 Xerosis cutis: Secondary | ICD-10-CM | POA: Diagnosis not present

## 2022-12-04 ENCOUNTER — Other Ambulatory Visit: Payer: Medicare Other

## 2022-12-09 ENCOUNTER — Ambulatory Visit: Payer: Medicare Other | Attending: Cardiology

## 2022-12-09 DIAGNOSIS — Z951 Presence of aortocoronary bypass graft: Secondary | ICD-10-CM

## 2022-12-09 DIAGNOSIS — I252 Old myocardial infarction: Secondary | ICD-10-CM | POA: Diagnosis not present

## 2022-12-09 DIAGNOSIS — I2510 Atherosclerotic heart disease of native coronary artery without angina pectoris: Secondary | ICD-10-CM | POA: Diagnosis not present

## 2022-12-09 DIAGNOSIS — R079 Chest pain, unspecified: Secondary | ICD-10-CM

## 2022-12-09 DIAGNOSIS — R0609 Other forms of dyspnea: Secondary | ICD-10-CM

## 2022-12-09 LAB — ECHOCARDIOGRAM COMPLETE
Calc EF: 50.2 %
S' Lateral: 3.1 cm
Single Plane A2C EF: 46 %
Single Plane A4C EF: 52.1 %

## 2022-12-12 ENCOUNTER — Telehealth: Payer: Self-pay

## 2022-12-12 DIAGNOSIS — M542 Cervicalgia: Secondary | ICD-10-CM | POA: Diagnosis not present

## 2022-12-12 DIAGNOSIS — R234 Changes in skin texture: Secondary | ICD-10-CM | POA: Diagnosis not present

## 2022-12-12 DIAGNOSIS — I10 Essential (primary) hypertension: Secondary | ICD-10-CM | POA: Diagnosis not present

## 2022-12-12 DIAGNOSIS — R519 Headache, unspecified: Secondary | ICD-10-CM | POA: Diagnosis not present

## 2022-12-12 DIAGNOSIS — R21 Rash and other nonspecific skin eruption: Secondary | ICD-10-CM | POA: Diagnosis not present

## 2022-12-12 NOTE — Telephone Encounter (Signed)
Results reviewed with pt's daughter- per DPR- as per Dr. Krasowski's note.  Daughter verbalized understanding and had no additional questions. Routed to PCP 

## 2022-12-25 DIAGNOSIS — H269 Unspecified cataract: Secondary | ICD-10-CM | POA: Insufficient documentation

## 2022-12-25 DIAGNOSIS — E559 Vitamin D deficiency, unspecified: Secondary | ICD-10-CM | POA: Insufficient documentation

## 2022-12-25 HISTORY — DX: Unspecified cataract: H26.9

## 2023-01-01 DIAGNOSIS — M5136 Other intervertebral disc degeneration, lumbar region: Secondary | ICD-10-CM | POA: Diagnosis not present

## 2023-01-01 DIAGNOSIS — G629 Polyneuropathy, unspecified: Secondary | ICD-10-CM | POA: Diagnosis not present

## 2023-01-01 DIAGNOSIS — M7061 Trochanteric bursitis, right hip: Secondary | ICD-10-CM | POA: Diagnosis not present

## 2023-01-02 ENCOUNTER — Ambulatory Visit: Payer: Medicare Other | Admitting: Cardiology

## 2023-01-22 ENCOUNTER — Ambulatory Visit: Payer: Medicare Other | Admitting: Cardiology

## 2023-02-09 NOTE — Progress Notes (Deleted)
Cardiology Office Note:    Date:  02/09/2023   ID:  Molly Chandler, DOB 03-05-1935, MRN [id number]  PCP:  Lars Mage, NP   [facility] Providers Cardiologist:  Bryan Lemma, MD { Click to update primary MD,subspecialty MD or APP then REFRESH:1}    Referring MD: Lars Mage, NP   No chief complaint on file. ***  History of Present Illness:    Molly Chandler is a 88 y.o. female with a hx of CAD s/p CABG was 2 in 2020, ischemic cardiomyopathy, GERD, hyperlipidemia, morbid obesity.  Most recently she was evaluated by Dr. Bing Matter on 11/21/2022, at this time she was complaining of some atypical chest pain.  She was restarted on her aspirin daily, Ranexa was started as well.  Repeat Lexiscan was arranged which showed no ischemia.  Repeat echocardiogram was arranged and completed on 4-24 revealed an EF 50 to 55%, grade 1 DD, mild MR.  Past Medical History:  Diagnosis Date   Abnormal findings on diagnostic imaging of other parts of digestive tract 11/04/2022   Cataract 12/25/2022   Coronary artery disease with hx of myocardial infarct w/o hx of CABG 10/05/2018   Presented as anterior STEMI --> Cath revealed 95% distal LM-ostial LAD thrombotic lesion -> initial EF 20 to 25% (LV Gram) significant reduced, IABP => to OR ~2 hours after stabilization (due to emergency OR team already working). -> s/p CABG x 2 (LIMA-LAD, SVG-OM) -> post MI echo EF 50 to 55%.   Degeneration of lumbar intervertebral disc 08/06/2015   Diaphragmatic hernia 11/04/2022   Dysphagia, unspecified 11/04/2022   GERD (gastroesophageal reflux disease) 09/26/2011   History of ST elevation myocardial infarction (STEMI) 10/05/2018   Denies ever having chest pain.  Had nausea vomiting diarrhea ongoing since March.  After significant diarrhea episode had near syncope followed by syncope --> EMS EKG showed anterior ST elevation   Hyperlipidemia with target LDL less than 70 05/31/2019   Leg swelling  11/16/2020   Morbid (severe) obesity due to excess calories (HCC) 11/04/2022   Osteoarthritis 02/05/2018   Osteoporosis 02/05/2018   Polyneuropathy 04/29/2017   S/P CABG x 2 10/05/2018   Slow transit constipation 11/04/2022   Vitamin D deficiency     Past Surgical History:  Procedure Laterality Date   CORONARY ARTERY BYPASS GRAFT N/A 10/05/2018   Procedure: CORONARY ARTERY BYPASS GRAFTING (CABG)x 2, LIMA-LAD, (right leg GSV) rSVG-OM (SVG harvest endoscopically);  Surgeon: Alleen Borne, MD;  [facility];  Service: Cardiothoracic;  Laterality: N/A;   CORONARY/GRAFT ACUTE MI REVASCULARIZATION N/A 10/05/2018   no PCI --> IABP --> EMERGENT CABG X 2   IABP INSERTION N/A 10/05/2018   Procedure: IABP Insertion;  Surgeon: Marykay Lex, MD;  Location: [facility] INVASIVE CV LAB;  Service: Cardiovascular;  Laterality: N/A;   LEFT HEART CATH AND CORONARY ANGIOGRAPHY N/A 10/05/2018   Procedure: LEFT HEART CATH AND CORONARY ANGIOGRAPHY;  Surgeon: Marykay Lex, MD;  Location: [facility] INVASIVE CV LAB; STEMI/SYNCOPE:  Cath -> dLM-OstLAD 95% (thrombotic), ost Cx 50%. Initial EF 25-35% - anterior HK.  2+ MR. Elevated LVEDP --> IABP -> CABG   NM MYOVIEW LTD  11/2019   INTERMEDIATE RISK.  Normal EF 55 to 65%.  Medium size moderate severity fixed defect in the mid anterolateral and apical lateral wall consistent with prior INFARCT.  NO EVIDENCE OF ISCHEMIA   TRANSTHORACIC ECHOCARDIOGRAM  10/11/2018   Post CABG: EF 50-55%. Normal valves.  Abnormal Septal motion (normal post-op).  Normal atrial size.     Current Medications: No outpatient medications have been marked as taking for the 02/10/23 encounter (Appointment) with Flossie Dibble, NP.     Allergies:   Amoxicillin, Metoclopramide hcl, Atorvastatin, Metoclopramide, and Pregabalin   Social History   Socioeconomic History   Marital status: Widowed    Spouse name: Not on file   Number of children: 5   Years of education: Not on file   Highest  education level: Not on file  Occupational History   Occupation: Retired    Associate Professor: RETIRED  Tobacco Use   Smoking status: Former   Smokeless tobacco: Never  Building services engineer Use: Never used  Substance and Sexual Activity   Alcohol use: No   Drug use: Never   Sexual activity: Not on file  Other Topics Concern   Not on file  Social History Narrative   Desires CPR   Would not want life support prolonged if futile   Social Determinants of Health   Financial Resource Strain: High Risk (11/14/2021)   Overall Financial Resource Strain (CARDIA)    Difficulty of Paying Living Expenses: Hard  Food Insecurity: No Food Insecurity (11/14/2021)   Hunger Vital Sign    Worried About Running Out of Food in the Last Year: Never true    Ran Out of Food in the Last Year: Never true  Transportation Needs: No Transportation Needs (11/14/2021)   PRAPARE - Administrator, Civil Service (Medical): No    Lack of Transportation (Non-Medical): No  Physical Activity: Insufficiently Active (11/14/2021)   Exercise Vital Sign    Days of Exercise per Week: 2 days    Minutes of Exercise per Session: 30 min  Stress: Stress Concern Present (11/14/2021)   [employer] of Occupational Health - Occupational Stress Questionnaire    Feeling of Stress : Rather much  Social Connections: Moderately Integrated (11/14/2021)   Social Connection and Isolation Panel [NHANES]    Frequency of Communication with Friends and Family: More than three times a week    Frequency of Social Gatherings with Friends and Family: More than three times a week    Attends Religious Services: 1 to 4 times per year    Active Member of [employer] or Organizations: Yes    Attends Banker Meetings: 1 to 4 times per year    Marital Status: Widowed     Family History: The patient's ***family history includes Dementia in her father; Kidney disease in her mother.  ROS:   Please see the history of present illness.    ***  All other systems reviewed and are negative.  EKGs/Labs/Other Studies Reviewed:    The following studies were reviewed today: ***  EKG:  EKG is *** ordered today.  The ekg ordered today demonstrates ***  Recent Labs: 07/21/2022: TSH 1.970  Recent Lipid Panel    Component Value Date/Time   CHOL 199 06/04/2021 1125   TRIG 92 06/04/2021 1125   HDL 45 06/04/2021 1125   CHOLHDL 4.4 06/04/2021 1125   CHOLHDL 3.8 10/05/2018 0708   VLDL 4 10/05/2018 0708   LDLCALC 137 (H) 06/04/2021 1125   LDLDIRECT 156.4 05/30/2010 1501     Risk Assessment/Calculations:   {Does this patient have ATRIAL FIBRILLATION?:[phone]}  No BP recorded.  {Refresh Note OR Click here to enter BP  :1}***         Physical Exam:    VS:  There were no vitals taken for this  visit.    Wt Readings from Last 3 Encounters:  11/26/22 152 lb (68.9 kg)  11/21/22 152 lb 12.8 oz (69.3 kg)  10/22/22 130 lb (59 kg)     GEN: *** Well nourished, well developed in no acute distress HEENT: Normal NECK: No JVD; No carotid bruits LYMPHATICS: No lymphadenopathy CARDIAC: ***RRR, no murmurs, rubs, gallops RESPIRATORY:  Clear to auscultation without rales, wheezing or rhonchi  ABDOMEN: Soft, non-tender, non-distended MUSCULOSKELETAL:  No edema; No deformity  SKIN: Warm and dry NEUROLOGIC:  Alert and oriented x 3 PSYCHIATRIC:  Normal affect   ASSESSMENT:    No diagnosis found. PLAN:    In order of problems listed above:  ***      {Are you ordering a CV Procedure (e.g. stress test, cath, DCCV, TEE, etc)?   Press F2        :[id number]}    Medication Adjustments/Labs and Tests Ordered: Current medicines are reviewed at length with the patient today.  Concerns regarding medicines are outlined above.  No orders of the defined types were placed in this encounter.  No orders of the defined types were placed in this encounter.   There are no Patient Instructions on file for this visit.   Signed, Flossie Dibble, NP  02/09/2023 1:29 PM    [facility]

## 2023-02-10 ENCOUNTER — Emergency Department (HOSPITAL_COMMUNITY)
Admission: EM | Admit: 2023-02-10 | Discharge: 2023-02-10 | Disposition: A | Discharge status: Home / Self Care | Payer: Medicare Other | Attending: Emergency Medicine | Admitting: Emergency Medicine

## 2023-02-10 ENCOUNTER — Emergency Department (HOSPITAL_COMMUNITY): Payer: Medicare Other

## 2023-02-10 ENCOUNTER — Encounter (HOSPITAL_COMMUNITY): Payer: Self-pay

## 2023-02-10 ENCOUNTER — Ambulatory Visit: Payer: Medicare Other | Admitting: Cardiology

## 2023-02-10 ENCOUNTER — Other Ambulatory Visit: Payer: Self-pay

## 2023-02-10 DIAGNOSIS — D649 Anemia, unspecified: Secondary | ICD-10-CM | POA: Diagnosis not present

## 2023-02-10 DIAGNOSIS — Z7982 Long term (current) use of aspirin: Secondary | ICD-10-CM | POA: Diagnosis not present

## 2023-02-10 DIAGNOSIS — I2510 Atherosclerotic heart disease of native coronary artery without angina pectoris: Secondary | ICD-10-CM | POA: Insufficient documentation

## 2023-02-10 DIAGNOSIS — R531 Weakness: Secondary | ICD-10-CM | POA: Diagnosis not present

## 2023-02-10 DIAGNOSIS — R600 Localized edema: Secondary | ICD-10-CM | POA: Diagnosis not present

## 2023-02-10 DIAGNOSIS — Z951 Presence of aortocoronary bypass graft: Secondary | ICD-10-CM | POA: Diagnosis not present

## 2023-02-10 DIAGNOSIS — R7989 Other specified abnormal findings of blood chemistry: Secondary | ICD-10-CM | POA: Diagnosis not present

## 2023-02-10 DIAGNOSIS — N3000 Acute cystitis without hematuria: Secondary | ICD-10-CM | POA: Insufficient documentation

## 2023-02-10 DIAGNOSIS — R0602 Shortness of breath: Secondary | ICD-10-CM | POA: Diagnosis not present

## 2023-02-10 LAB — TROPONIN I (HIGH SENSITIVITY): Troponin I (High Sensitivity): 7 ng/L (ref <18)

## 2023-02-10 LAB — URINALYSIS, ROUTINE W REFLEX MICROSCOPIC
Bilirubin Urine: NEGATIVE
Glucose, UA: NEGATIVE mg/dL
Hgb urine dipstick: NEGATIVE
Ketones, ur: NEGATIVE mg/dL
Nitrite: POSITIVE — AB
Protein, ur: NEGATIVE mg/dL
Specific Gravity, Urine: 1.006 (ref 1.005–1.030)
pH: 5 (ref 5.0–8.0)

## 2023-02-10 LAB — CBC
HCT: 34 % — ABNORMAL LOW (ref 36.0–46.0)
Hemoglobin: 10.9 g/dL — ABNORMAL LOW (ref 12.0–15.0)
MCH: 31.1 pg (ref 26.0–34.0)
MCHC: 32.1 g/dL (ref 30.0–36.0)
MCV: 96.9 fL (ref 80.0–100.0)
Platelets: 286 10*3/uL (ref 150–400)
RBC: 3.51 MIL/uL — ABNORMAL LOW (ref 3.87–5.11)
RDW: 14.5 % (ref 11.5–15.5)
WBC: 7.6 10*3/uL (ref 4.0–10.5)
nRBC: 0 % (ref 0.0–0.2)

## 2023-02-10 LAB — DIFFERENTIAL
Abs Immature Granulocytes: 0.21 10*3/uL — ABNORMAL HIGH (ref 0.00–0.07)
Basophils Absolute: 0 10*3/uL (ref 0.0–0.1)
Basophils Relative: 0 %
Eosinophils Absolute: 0.1 10*3/uL (ref 0.0–0.5)
Eosinophils Relative: 1 %
Immature Granulocytes: 3 %
Lymphocytes Relative: 20 %
Lymphs Abs: 1.5 10*3/uL (ref 0.7–4.0)
Monocytes Absolute: 0.6 10*3/uL (ref 0.1–1.0)
Monocytes Relative: 8 %
Neutro Abs: 5.2 10*3/uL (ref 1.7–7.7)
Neutrophils Relative %: 68 %

## 2023-02-10 LAB — BASIC METABOLIC PANEL
Anion gap: 8 (ref 5–15)
BUN: 33 mg/dL — ABNORMAL HIGH (ref 8–23)
CO2: 22 mmol/L (ref 22–32)
Calcium: 9.4 mg/dL (ref 8.9–10.3)
Chloride: 106 mmol/L (ref 98–111)
Creatinine, Ser: 1.29 mg/dL — ABNORMAL HIGH (ref 0.44–1.00)
GFR, Estimated: 40 mL/min — ABNORMAL LOW (ref >60)
Glucose, Bld: 100 mg/dL — ABNORMAL HIGH (ref 70–99)
Potassium: 5.1 mmol/L (ref 3.5–5.1)
Sodium: 136 mmol/L (ref 135–145)

## 2023-02-10 LAB — LACTIC ACID, PLASMA: Lactic Acid, Venous: 1.1 mmol/L (ref 0.5–1.9)

## 2023-02-10 LAB — TSH: TSH: 1.404 u[IU]/mL (ref 0.350–4.500)

## 2023-02-10 LAB — BRAIN NATRIURETIC PEPTIDE: B Natriuretic Peptide: 58.4 pg/mL (ref 0.0–100.0)

## 2023-02-10 MED ORDER — CEPHALEXIN 500 MG PO CAPS: 500.0000 mg | ORAL_CAPSULE | Freq: Four times a day (QID) | ORAL | 0 refills | Status: DC

## 2023-02-10 MED ORDER — SODIUM CHLORIDE 0.9 % IV BOLUS
1000.0000 mL | Freq: Once | INTRAVENOUS | Status: AC
  Administered 2023-02-10: 1000 mL via INTRAVENOUS

## 2023-02-10 NOTE — Discharge Instructions (Signed)
Your creatinine was mildly elevated.  Try and keep yourself hydrated.  Follow-up with your doctor.  A culture has been sent and you will be notified if it does not cover the infection.

## 2023-02-10 NOTE — ED Provider Notes (Signed)
[place] EMERGENCY DEPARTMENT AT [facility] Provider Note   CSN: [id number] Arrival date & time: 02/10/23  1119     History  Chief Complaint  Patient presents with   Shortness of Breath   Weakness    Molly Chandler is a 88 y.o. female.   Shortness of Breath Weakness Associated symptoms: shortness of breath   Patient resents with shortness of breath and weakness.  Is had for 2 weeks now.  States that comes on with exertion.  States tries to walk and cannot.  Feels dizzy.  States she will get pain in the back of her neck with the exertion.  States no chest pain.  States 2 weeks ago she did a lot of work in her garden but since has been weak.  No change in medicines.  States she has had nausea and somewhat decreased oral intake.  No diarrhea.  Has not vomited.    Past Medical History:  Diagnosis Date   Abnormal findings on diagnostic imaging of other parts of digestive tract 11/04/2022   Cataract 12/25/2022   Coronary artery disease with hx of myocardial infarct w/o hx of CABG 10/05/2018   Presented as anterior STEMI --> Cath revealed 95% distal LM-ostial LAD thrombotic lesion -> initial EF 20 to 25% (LV Gram) significant reduced, IABP => to OR ~2 hours after stabilization (due to emergency OR team already working). -> s/p CABG x 2 (LIMA-LAD, SVG-OM) -> post MI echo EF 50 to 55%.   Degeneration of lumbar intervertebral disc 08/06/2015   Diaphragmatic hernia 11/04/2022   Dysphagia, unspecified 11/04/2022   GERD (gastroesophageal reflux disease) 09/26/2011   History of ST elevation myocardial infarction (STEMI) 10/05/2018   Denies ever having chest pain.  Had nausea vomiting diarrhea ongoing since March.  After significant diarrhea episode had near syncope followed by syncope --> EMS EKG showed anterior ST elevation   Hyperlipidemia with target LDL less than 70 05/31/2019   Leg swelling 11/16/2020   Morbid (severe) obesity due to excess calories (HCC) 11/04/2022    Osteoarthritis 02/05/2018   Osteoporosis 02/05/2018   Polyneuropathy 04/29/2017   S/P CABG x 2 10/05/2018   Slow transit constipation 11/04/2022   Vitamin D deficiency     Home Medications Prior to Admission medications   Medication Sig Start Date End Date Taking? Authorizing Provider  isosorbide mononitrate (IMDUR) 30 MG 24 hr tablet Take 1 tablet (30 mg total) by mouth daily. 11/28/22   Georgeanna Lea, MD  acetaminophen (TYLENOL) 325 MG tablet Take 650 mg by mouth every 6 (six) hours as needed for mild pain or moderate pain.    [provider]  Ascorbic Acid (VITAMIN C) 1000 MG tablet Take 1,000 mg by mouth daily.    [provider]  aspirin EC 81 MG tablet Take 1 tablet (81 mg total) by mouth daily. Swallow whole. 11/21/22   Georgeanna Lea, MD  cephALEXin (KEFLEX) 500 MG capsule Take 1 capsule (500 mg total) by mouth 4 (four) times daily. 02/10/23   Benjiman Core, MD  cholecalciferol (VITAMIN D3) 25 MCG (1000 UNIT) tablet Take 1,000 Units by mouth daily.    [provider]  cyanocobalamin (VITAMIN B12) 1000 MCG tablet Take 1,000 mcg by mouth daily.    [provider]  latanoprost (XALATAN) 0.005 % ophthalmic solution Place 1 drop into both eyes daily. 03/30/20   [provider]  lisinopril (ZESTRIL) 10 MG tablet Take 10 mg by mouth daily. 10/22/22   [provider]  Misc Natural Products (CHOLESTEROL SUPPORT PO) Take 1 tablet by mouth daily.    [provider]  Multiple Vitamin (MULTIVITAMIN) tablet Take 1 tablet by mouth daily.    [provider]  Omega-3 Fatty Acids (FISH OIL) 1000 MG CAPS Take 1,000 mg by mouth daily.    [provider]  omeprazole (PRILOSEC) 40 MG capsule Take 40 mg by mouth daily.    [provider]  Probiotic Product (PROBIOTIC DAILY PO) Take 1 tablet by mouth daily.     [provider]  rosuvastatin (CRESTOR) 20 MG tablet Take 20 mg by mouth daily. 10/23/22    [provider]  Vitamin D, Ergocalciferol, (DRISDOL) 1.25 MG (50000 UNIT) CAPS capsule Take 50,000 Units by mouth every 7 (seven) days.    [provider]      Allergies    Amoxicillin, Metoclopramide hcl, Atorvastatin, Metoclopramide, and Pregabalin    Review of Systems   Review of Systems  Respiratory:  Positive for shortness of breath.   Neurological:  Positive for weakness.    Physical Exam Updated Vital Signs BP 113/74   Pulse 92   Temp 98.3 F (36.8 C) (Oral)   Resp (!) 21   SpO2 100%  Physical Exam Vitals and nursing note reviewed.  HENT:     Head: Normocephalic.  Cardiovascular:     Rate and Rhythm: Normal rate and regular rhythm.  Pulmonary:     Breath sounds: No rhonchi or rales.  Chest:     Chest wall: No tenderness.  Abdominal:     Tenderness: There is no abdominal tenderness.  Musculoskeletal:     Comments: Tenderness to upper and lower extremities with mild edema in both lower extremities.  Skin:    Capillary Refill: Capillary refill takes less than 2 seconds.  Neurological:     Mental Status: She is alert.     ED Results / Procedures / Treatments   Labs (all labs ordered are listed, but only abnormal results are displayed) Labs Reviewed  BASIC METABOLIC PANEL - Abnormal; Notable for the following components:      Result Value   Glucose, Bld 100 (*)    BUN 33 (*)    Creatinine, Ser 1.29 (*)    GFR, Estimated 40 (*)    All other components within normal limits  CBC - Abnormal; Notable for the following components:   RBC 3.51 (*)    Hemoglobin 10.9 (*)    HCT 34.0 (*)    All other components within normal limits  URINALYSIS, ROUTINE W REFLEX MICROSCOPIC - Abnormal; Notable for the following components:   APPearance HAZY (*)    Nitrite POSITIVE (*)    Leukocytes,Ua MODERATE (*)    Bacteria, UA MANY (*)    All other components within normal limits  DIFFERENTIAL - Abnormal; Notable for the following components:   Abs  Immature Granulocytes 0.21 (*)    All other components within normal limits  URINE CULTURE  BRAIN NATRIURETIC PEPTIDE  TSH  LACTIC ACID, PLASMA  TROPONIN I (HIGH SENSITIVITY)    EKG EKG Interpretation  Date/Time:  Tuesday February 10 2023 11:56:41 EDT Ventricular Rate:  93 PR Interval:  168 QRS Duration: 86 QT Interval:  334 QTC Calculation: 415 R Axis:   5 Text Interpretation: Normal sinus rhythm Normal ECG When compared with ECG of 10-Feb-2023 11:31, No significant change since last tracing Confirmed by Benjiman Core [phone]) on 02/10/2023 4:22:04 PM  Radiology DG Chest 2 View  Result Date: 02/10/2023 CLINICAL DATA:  Shortness of breath EXAM: CHEST - 2 VIEW COMPARISON:  X-ray 08/24/2022 FINDINGS: Status post median sternotomy. Calcified aorta. Normal cardiopericardial silhouette without edema. Slight blunting of the left costophrenic angle. Tiny effusion versus pleural thickening. No consolidation, pneumothorax. Degenerative changes seen of the shoulders and spine. There is curvature of the spine as well. IMPRESSION: Postop chest. Slight blunting of the left costophrenic angle. Tiny effusion versus pleural thickening. Electronically Signed   By: Karen Kays M.D.   On: 02/10/2023 13:08    Procedures Procedures    Medications Ordered in ED Medications  sodium chloride 0.9 % bolus 1,000 mL ( Intravenous Stopped 02/10/23 1819)    ED Course/ Medical Decision Making/ A&P                             Medical Decision Making Amount and/or Complexity of Data Reviewed Labs: ordered. Radiology: ordered.  Risk Prescription drug management.   Patient with weakness and hypotension.  Is had for 2 weeks.  Has some decreased oral intake.  Also exertional dyspnea and neck tightness.  EKG reassuring but neck tightness potentially could be an anginal equivalent.  X-ray overall reassuring.  Mild anemia and creatinine mildly elevated.  Potentially could be due to some dehydration.  Blood work  otherwise reassuring.  Chest x-ray reassuring.  However it took a while to get the urine back and eventually did show likely infection.  Has not necessarily had dysuria but with the acute weakness I think it is worth treating.  Culture sent.  Will treat with Keflex.  Appears stable for discharge home however.  Was not  orthostatic.       Final Clinical Impression(s) / ED Diagnoses Final diagnoses:  Generalized weakness  Acute cystitis without hematuria    Rx / DC Orders ED Discharge Orders          Ordered    cephALEXin (KEFLEX) 500 MG capsule  4 times daily,   Status:  Discontinued        02/10/23 2002    cephALEXin (KEFLEX) 500 MG capsule  4 times daily        02/10/23 2020              Benjiman Core, MD 02/10/23 2022

## 2023-02-10 NOTE — ED Provider Triage Note (Signed)
Emergency Medicine Provider Triage Evaluation Note  Molly Chandler , a 88 y.o. female  was evaluated in triage.  Pt complains of exertional dyspnea for the past 2 weeks.  No chest pain.  Does have history of CAD and is status post CABG.  Symptoms worse in the past couple days.  Denies peripheral edema.  Review of Systems  Positive: As above Negative: As above  Physical Exam  BP (!) 125/108   Pulse 90   Temp 98.1 F (36.7 C)   Resp 20   SpO2 97%  Gen:   Awake, no distress   Resp:  Normal effort  MSK:   Moves extremities without difficulty  Other:    Medical Decision Making  Medically screening exam initiated at 12:13 PM.  Appropriate orders placed.  Molly Chandler was informed that the remainder of the evaluation will be completed by another provider, this initial triage assessment does not replace that evaluation, and the importance of remaining in the ED until their evaluation is complete.     Marita [place], PA-C 02/10/23 1214

## 2023-02-10 NOTE — ED Triage Notes (Signed)
Patient reports sob, worse with exertion and when lying down, as well as weakness x 2 weeks but worsening in the last two days and and now also having nausea and decreased appetite.

## 2023-02-13 LAB — URINE CULTURE: Culture: >=100000 — AB

## 2023-02-14 ENCOUNTER — Telehealth (HOSPITAL_BASED_OUTPATIENT_CLINIC_OR_DEPARTMENT_OTHER): Payer: Self-pay | Admitting: *Deleted

## 2023-02-14 NOTE — Telephone Encounter (Signed)
Post ED Visit - Positive Culture Follow-up  Culture report reviewed by antimicrobial stewardship pharmacist: [facility] []  Enzo Bi, Pharm.D. []  Celedonio Miyamoto, Pharm.D., BCPS AQ-ID []  Garvin Fila, Pharm.D., BCPS []  Georgina Pillion, Pharm.D., BCPS []  [place], [place].D., BCPS, AAHIVP []  Estella Husk, Pharm.D., BCPS, AAHIVP []  Lysle Pearl, PharmD, BCPS []  Phillips Climes, PharmD, BCPS []  Agapito Games, PharmD, BCPS []  Verlan Friends, PharmD []  Mervyn Gay, PharmD, BCPS [x]  Doreen Beam, PharmD  [facility] []  Len Childs, PharmD []  Greer Pickerel, PharmD []  Adalberto Cole, PharmD []  Perlie Gold, Rph []  Lonell Face) Jean Rosenthal, PharmD []  Earl Many, PharmD []  Junita Push, PharmD []  Dorna Leitz, PharmD []  Terrilee Files, PharmD []  Lynann Beaver, PharmD []  Keturah Barre, PharmD []  Loralee Pacas, PharmD []  Bernadene Person, PharmD   Positive urine culture Treated with Cephalexin, organism sensitive to the same and no further patient follow-up is required at this time.  Patsey Berthold 02/14/2023, 12:30 PM

## 2023-03-04 DIAGNOSIS — R809 Proteinuria, unspecified: Secondary | ICD-10-CM | POA: Diagnosis not present

## 2023-03-04 DIAGNOSIS — R002 Palpitations: Secondary | ICD-10-CM | POA: Diagnosis not present

## 2023-03-04 DIAGNOSIS — I10 Essential (primary) hypertension: Secondary | ICD-10-CM | POA: Diagnosis not present

## 2023-03-04 DIAGNOSIS — I951 Orthostatic hypotension: Secondary | ICD-10-CM | POA: Diagnosis not present

## 2023-03-06 DIAGNOSIS — I10 Essential (primary) hypertension: Secondary | ICD-10-CM | POA: Diagnosis not present

## 2023-03-08 DIAGNOSIS — R002 Palpitations: Secondary | ICD-10-CM | POA: Diagnosis not present

## 2023-03-20 DIAGNOSIS — R944 Abnormal results of kidney function studies: Secondary | ICD-10-CM | POA: Diagnosis not present

## 2023-03-20 DIAGNOSIS — D509 Iron deficiency anemia, unspecified: Secondary | ICD-10-CM | POA: Diagnosis not present

## 2023-03-20 DIAGNOSIS — Z13228 Encounter for screening for other metabolic disorders: Secondary | ICD-10-CM | POA: Diagnosis not present

## 2023-03-20 DIAGNOSIS — D519 Vitamin B12 deficiency anemia, unspecified: Secondary | ICD-10-CM | POA: Diagnosis not present

## 2023-04-09 DIAGNOSIS — M7061 Trochanteric bursitis, right hip: Secondary | ICD-10-CM | POA: Diagnosis not present

## 2023-04-09 DIAGNOSIS — M5136 Other intervertebral disc degeneration, lumbar region: Secondary | ICD-10-CM | POA: Diagnosis not present

## 2023-04-27 DIAGNOSIS — R8290 Unspecified abnormal findings in urine: Secondary | ICD-10-CM | POA: Diagnosis not present

## 2023-04-27 DIAGNOSIS — R42 Dizziness and giddiness: Secondary | ICD-10-CM | POA: Diagnosis not present

## 2023-04-27 DIAGNOSIS — N390 Urinary tract infection, site not specified: Secondary | ICD-10-CM | POA: Diagnosis not present

## 2023-05-05 DIAGNOSIS — I6782 Cerebral ischemia: Secondary | ICD-10-CM | POA: Diagnosis not present

## 2023-05-05 DIAGNOSIS — R519 Headache, unspecified: Secondary | ICD-10-CM | POA: Diagnosis not present

## 2023-05-05 DIAGNOSIS — R42 Dizziness and giddiness: Secondary | ICD-10-CM | POA: Diagnosis not present

## 2023-05-17 ENCOUNTER — Other Ambulatory Visit: Payer: Self-pay | Admitting: Cardiovascular Disease

## 2023-06-02 DIAGNOSIS — N3020 Other chronic cystitis without hematuria: Secondary | ICD-10-CM | POA: Diagnosis not present

## 2023-06-02 DIAGNOSIS — R350 Frequency of micturition: Secondary | ICD-10-CM | POA: Diagnosis not present

## 2023-06-02 DIAGNOSIS — N139 Obstructive and reflux uropathy, unspecified: Secondary | ICD-10-CM | POA: Diagnosis not present

## 2023-06-18 DIAGNOSIS — M5134 Other intervertebral disc degeneration, thoracic region: Secondary | ICD-10-CM | POA: Diagnosis not present

## 2023-06-18 DIAGNOSIS — R944 Abnormal results of kidney function studies: Secondary | ICD-10-CM | POA: Diagnosis not present

## 2023-06-18 DIAGNOSIS — I10 Essential (primary) hypertension: Secondary | ICD-10-CM | POA: Diagnosis not present

## 2023-06-18 DIAGNOSIS — G8929 Other chronic pain: Secondary | ICD-10-CM | POA: Diagnosis not present

## 2023-06-18 DIAGNOSIS — M50321 Other cervical disc degeneration at C4-C5 level: Secondary | ICD-10-CM | POA: Diagnosis not present

## 2023-06-18 DIAGNOSIS — M542 Cervicalgia: Secondary | ICD-10-CM | POA: Diagnosis not present

## 2023-06-18 DIAGNOSIS — M4802 Spinal stenosis, cervical region: Secondary | ICD-10-CM | POA: Diagnosis not present

## 2023-06-18 DIAGNOSIS — R42 Dizziness and giddiness: Secondary | ICD-10-CM | POA: Diagnosis not present

## 2023-06-30 DIAGNOSIS — N3020 Other chronic cystitis without hematuria: Secondary | ICD-10-CM | POA: Diagnosis not present

## 2023-07-16 DIAGNOSIS — M542 Cervicalgia: Secondary | ICD-10-CM | POA: Diagnosis not present

## 2023-07-16 DIAGNOSIS — M7918 Myalgia, other site: Secondary | ICD-10-CM | POA: Diagnosis not present

## 2023-07-16 DIAGNOSIS — M7061 Trochanteric bursitis, right hip: Secondary | ICD-10-CM | POA: Diagnosis not present

## 2023-07-21 DIAGNOSIS — Z9842 Cataract extraction status, left eye: Secondary | ICD-10-CM | POA: Diagnosis not present

## 2023-07-21 DIAGNOSIS — H401111 Primary open-angle glaucoma, right eye, mild stage: Secondary | ICD-10-CM | POA: Diagnosis not present

## 2023-07-21 DIAGNOSIS — Z9841 Cataract extraction status, right eye: Secondary | ICD-10-CM | POA: Diagnosis not present

## 2023-07-21 DIAGNOSIS — H52223 Regular astigmatism, bilateral: Secondary | ICD-10-CM | POA: Diagnosis not present

## 2023-07-21 DIAGNOSIS — H401123 Primary open-angle glaucoma, left eye, severe stage: Secondary | ICD-10-CM | POA: Diagnosis not present

## 2023-07-21 DIAGNOSIS — H59812 Chorioretinal scars after surgery for detachment, left eye: Secondary | ICD-10-CM | POA: Diagnosis not present

## 2023-07-28 DIAGNOSIS — M542 Cervicalgia: Secondary | ICD-10-CM | POA: Diagnosis not present

## 2023-08-11 DIAGNOSIS — N3020 Other chronic cystitis without hematuria: Secondary | ICD-10-CM | POA: Diagnosis not present

## 2023-09-16 DIAGNOSIS — N3020 Other chronic cystitis without hematuria: Secondary | ICD-10-CM | POA: Diagnosis not present

## 2023-09-16 DIAGNOSIS — R350 Frequency of micturition: Secondary | ICD-10-CM | POA: Diagnosis not present

## 2023-09-22 DIAGNOSIS — M5412 Radiculopathy, cervical region: Secondary | ICD-10-CM | POA: Diagnosis not present

## 2023-10-19 DIAGNOSIS — M542 Cervicalgia: Secondary | ICD-10-CM | POA: Diagnosis not present

## 2023-10-19 DIAGNOSIS — M7061 Trochanteric bursitis, right hip: Secondary | ICD-10-CM | POA: Diagnosis not present

## 2023-11-05 DIAGNOSIS — M47812 Spondylosis without myelopathy or radiculopathy, cervical region: Secondary | ICD-10-CM | POA: Diagnosis not present

## 2023-11-05 DIAGNOSIS — M542 Cervicalgia: Secondary | ICD-10-CM | POA: Diagnosis not present

## 2023-11-13 DIAGNOSIS — H16213 Exposure keratoconjunctivitis, bilateral: Secondary | ICD-10-CM | POA: Diagnosis not present

## 2023-11-23 ENCOUNTER — Ambulatory Visit: Attending: Cardiology | Admitting: Cardiology

## 2023-11-23 ENCOUNTER — Encounter: Payer: Self-pay | Admitting: Cardiology

## 2023-11-23 VITALS — BP 124/78 | HR 102 | Ht <= 58 in | Wt 134.2 lb

## 2023-11-23 DIAGNOSIS — R5383 Other fatigue: Secondary | ICD-10-CM

## 2023-11-23 DIAGNOSIS — I2510 Atherosclerotic heart disease of native coronary artery without angina pectoris: Secondary | ICD-10-CM

## 2023-11-23 DIAGNOSIS — I10 Essential (primary) hypertension: Secondary | ICD-10-CM | POA: Diagnosis not present

## 2023-11-23 DIAGNOSIS — I252 Old myocardial infarction: Secondary | ICD-10-CM

## 2023-11-23 DIAGNOSIS — Z951 Presence of aortocoronary bypass graft: Secondary | ICD-10-CM

## 2023-11-23 DIAGNOSIS — E785 Hyperlipidemia, unspecified: Secondary | ICD-10-CM | POA: Diagnosis not present

## 2023-11-23 NOTE — Progress Notes (Signed)
 Cardiology Office Note:  .   Date:  11/23/2023  ID:  Molly Chandler, DOB 07/04/1935, MRN [id number] PCP: Lars Mage, NP  [facility] Providers Cardiologist:  Gypsy Balsam, MD    History of Present Illness: .   Molly Chandler is a 89 y.o. female with a past medical history of CAD s/p CABG x 2, GERD, polyneuropathy, dyslipidemia.  12/09/2022 echo EF 50 to 55% grade 1 DD 11/26/2022 Lexiscan negative for ischemia 12/06/2019 monitor average heart rate 76 bpm, rare PACs, 7 brief runs of SVT 11/29/2019 Lexiscan negative for ischemia 10/06/2018 CABG x 2 LIMA to LAD, SVG to OM 10/05/2018 left heart cath >> recommendations for CABG  Most recently evaluated by Dr. Bing Matter on 11/21/2022, she was bothered by an atypical sensation in her neck that occurred upon standing.  Repeat Lexiscan was arranged which was negative as well as an echocardiogram which was overall unchanged and she was advised to follow-up in a year.  She presents today for follow up accompanied by her daughter with concerns of atypical chest pain.  She apparently had been lifting some totes, searching for a particular blanket and had a sharp sensation in her chest.  This was a few weeks ago, has not bothered her since.  She has also been complaining of feeling weak, hard for her to decipher, just feels like she has a poor appetite.  She is fiercely independent, manages all of her own affairs, she states she was helping her other daughter move but dishwasher the previous day so it could be clean behind. She denies chest pain, palpitations, dyspnea, pnd, orthopnea, n, v, dizziness, syncope, edema, weight gain, or early satiety.   ROS: Review of Systems  Constitutional:  Positive for malaise/fatigue.  Endo/Heme/Allergies:  Bruises/bleeds easily.  All other systems reviewed and are negative.    Studies Reviewed: .        Cardiac Studies & Procedures    ______________________________________________________________________________________________ CARDIAC CATHETERIZATION  CARDIAC CATHETERIZATION 10/05/2018  Narrative Images from the original result were not included.   CULPRIT LESION: Dist LM to Ost LAD lesion is 95% stenosed. Ost Cx lesion is 50% stenosed.  There is moderate to severe left ventricular systolic dysfunction. The left ventricular ejection fraction is 25-35% by visual estimate -dense anterior wall motion and normality  LV end diastolic pressure is severely elevated.  There is no aortic valve stenosis. There is mild (2+) mitral regurgitation.  SUMMARY  Severe distal left main-ostial LAD 95% stenosis involving ostium of LCx 50%.  Moderate severely reduced LVEF with diffuse anterior hypokinesis and severely elevated LVEDP  Borderline cardiogenic shock, likely related to nitroglycerin administration, resolved after brief course of IV Levophed.  ACUTE COMBINED SYSTOLIC AND DIASTOLIC HEART FAILURE from acute ischemic cardiomyopathy   RECOMMENDATIONS  Based on location of stenosis, best course of action is for CABG.  After discussion with Dr. Cornelius Moras, the plan will be to stabilize her temporarily in the CVICU to allow time for the OR staff arrival.  She will then be taken for emergent CABG with IABP pump in place.  Echocardiogram is been ordered for postop  High-dose statin ordered.  Titration medications postop as tolerated.   Bryan Lemma, M.D., M.S. Interventional Cardiologist  Pager # [phone] Phone # [phone] [address] [place], [place] [address]  Findings Coronary Findings Diagnostic  Dominance: Right  Left Main Vessel is large. Dist LM to Ost LAD lesion is 95% stenosed. Vessel is the culprit lesion. The lesion is located  at the bifurcation, eccentric, irregular and ulcerative. Hazy appearing  Left Anterior Descending TIMI 2 flow to apex  First Septal Branch Vessel is small  in size.  Third Diagonal Branch Vessel is small in size.  Left Circumflex Vessel is large. Ost Cx lesion is 50% stenosed. The lesion is focal. Prox Cx to Mid Cx lesion is 20% stenosed. The lesion is eccentric.  First Obtuse Marginal Branch Vessel is moderate in size.  Second Obtuse Marginal Branch Vessel is large in size.  Left Posterior Atrioventricular Artery Vessel is small in size.  Right Coronary Artery Prox RCA lesion is 20% stenosed. The lesion is eccentric.  Acute Marginal Branch Vessel is small in size.  Inferior Septal Vessel is small in size.  Right Posterior Atrioventricular Artery Vessel is large in size.  First Right Posterolateral Branch Vessel is small in size.  Second Right Posterolateral Branch Vessel is small in size.  Intervention  No interventions have been documented.   STRESS TESTS  MYOCARDIAL PERFUSION IMAGING 11/26/2022  Narrative   Findings are consistent with no ischemia and no infarction. The study is low risk.   No ST deviation was noted.   Left ventricular function is normal. Nuclear stress EF: 62 %. The left ventricular ejection fraction is normal (55-65%). End diastolic cavity size is normal.   Prior study available for comparison from 11/29/2019.   ECHOCARDIOGRAM  ECHOCARDIOGRAM COMPLETE 12/09/2022  Narrative ECHOCARDIOGRAM REPORT    Patient Name:   Molly Chandler Date of Exam: 12/09/2022 Medical Rec #:  [id number]            Height:       59.0 in Accession #:    [id number]           Weight:       152.0 lb Date of Birth:  08-09-1935            BSA:          1.641 m Patient Age:    87 years             BP:           108/68 mmHg Patient Gender: F                    HR:           80 bpm. Exam Location:  [place]  Procedure: 2D Echo, Cardiac Doppler, Color Doppler and Strain Analysis  Indications:    Coronary artery disease with hx of myocardial infarct w/o hx of CABG [I25.10, I25.2 (ICD-10-CM)]; S/P CABG x 2  [Z95.1 (ICD-10-CM)]; Chest pain of uncertain etiology [R07.9 (ICD-10-CM)]; DOE (dyspnea on exertion) [R06.09 (ICD-10-CM)]  History:        Patient has prior history of Echocardiogram examinations, most recent 10/11/2018. CAD and Previous Myocardial Infarction; Risk Factors:Hypertension and Dyslipidemia.  Sonographer:    Louie Boston RDCS Referring Phys: [phone] Marveen Reeks KRASOWSKI  IMPRESSIONS   1. Left ventricular ejection fraction, by estimation, is 50 to 55%. Left ventricular ejection fraction by 2D MOD biplane is 50.2 %. The left ventricle has low normal function. The left ventricle has no regional wall motion abnormalities. Left ventricular diastolic parameters are consistent with Grade I diastolic dysfunction (impaired relaxation). The average left ventricular global longitudinal strain is -15.1 %. The global longitudinal strain is abnormal. 2. Right ventricular systolic function is mildly reduced. The right ventricular size is normal. There is normal pulmonary artery systolic pressure. 3. The mitral valve is normal in  structure. Mild mitral valve regurgitation. No evidence of mitral stenosis. 4. The aortic valve is tricuspid. Aortic valve regurgitation is not visualized. No aortic stenosis is present. 5. Aortic Normal DTA. 6. The inferior vena cava is normal in size with greater than 50% respiratory variability, suggesting right atrial pressure of 3 mmHg.  FINDINGS Left Ventricle: Left ventricular ejection fraction, by estimation, is 50 to 55%. Left ventricular ejection fraction by 2D MOD biplane is 50.2 %. The left ventricle has low normal function. The left ventricle has no regional wall motion abnormalities. The average left ventricular global longitudinal strain is -15.1 %. The global longitudinal strain is abnormal. The left ventricular internal cavity size was normal in size. There is no left ventricular hypertrophy. Left ventricular diastolic parameters are consistent with Grade I  diastolic dysfunction (impaired relaxation). Normal left ventricular filling pressure.  Right Ventricle: The right ventricular size is normal. No increase in right ventricular wall thickness. Right ventricular systolic function is mildly reduced. There is normal pulmonary artery systolic pressure. The tricuspid regurgitant velocity is 2.18 m/s, and with an assumed right atrial pressure of 3 mmHg, the estimated right ventricular systolic pressure is 22.0 mmHg.  Left Atrium: Left atrial size was normal in size.  Right Atrium: Right atrial size was normal in size.  Pericardium: There is no evidence of pericardial effusion.  Mitral Valve: The mitral valve is normal in structure. Mild mitral annular calcification. Mild mitral valve regurgitation. No evidence of mitral valve stenosis.  Tricuspid Valve: The tricuspid valve is normal in structure. Tricuspid valve regurgitation is mild . No evidence of tricuspid stenosis.  Aortic Valve: The aortic valve is tricuspid. Aortic valve regurgitation is not visualized. No aortic stenosis is present.  Pulmonic Valve: The pulmonic valve was normal in structure. Pulmonic valve regurgitation is mild. No evidence of pulmonic stenosis.  Aorta: Normal DTA, the aortic root and ascending aorta are structurally normal, with no evidence of dilitation and the aortic arch was not well visualized.  Venous: A normal flow pattern is recorded from the right upper pulmonary vein. The inferior vena cava is normal in size with greater than 50% respiratory variability, suggesting right atrial pressure of 3 mmHg.  IAS/Shunts: No atrial level shunt detected by color flow Doppler.   LEFT VENTRICLE PLAX 2D                        Biplane EF (MOD) LVIDd:         3.90 cm         LV Biplane EF:   Left LVIDs:         3.10 cm                          ventricular LV PW:         0.90 cm                          ejection LV IVS:        0.80 cm                          fraction  by LVOT diam:     1.80 cm                          2D MOD LV SV:  47                               biplane is LV SV Index:   29                               50.2 %. LVOT Area:     2.54 cm Diastology LV e' medial:    5.55 cm/s LV Volumes (MOD)               LV E/e' medial:  13.4 LV vol d, MOD    28.5 ml       LV e' lateral:   11.60 cm/s A2C:                           LV E/e' lateral: 6.4 LV vol d, MOD    45.5 ml A4C:                           2D LV vol s, MOD    15.4 ml       Longitudinal A2C:                           Strain LV vol s, MOD    21.8 ml       2D Strain GLS  -15.1 % A4C:                           Avg: LV SV MOD A2C:   13.1 ml LV SV MOD A4C:   45.5 ml LV SV MOD BP:    18.3 ml  RIGHT VENTRICLE            IVC RV S prime:     6.42 cm/s  IVC diam: 1.60 cm TAPSE (M-mode): 1.4 cm  LEFT ATRIUM             Index        RIGHT ATRIUM          Index LA diam:        2.90 cm 1.77 cm/m   RA Area:     9.18 cm LA Vol (A2C):   30.2 ml 18.40 ml/m  RA Volume:   15.80 ml 9.63 ml/m LA Vol (A4C):   31.6 ml 19.25 ml/m LA Biplane Vol: 31.2 ml 19.01 ml/m AORTIC VALVE LVOT Vmax:   95.40 cm/s LVOT Vmean:  63.900 cm/s LVOT VTI:    0.184 m  AORTA Ao Root diam: 3.00 cm Ao Asc diam:  2.70 cm Ao Desc diam: 2.05 cm  MV E velocity: 74.40 cm/s  TRICUSPID VALVE MV A velocity: 90.10 cm/s  TR Peak grad:   19.0 mmHg MV E/A ratio:  0.83        TR Vmax:        218.00 cm/s  SHUNTS Systemic VTI:  0.18 m Systemic Diam: 1.80 cm  Norman Herrlich MD Electronically signed by Norman Herrlich MD Signature Date/Time: 12/09/2022/5:25:32 PM    Final   TEE  ECHO TEE 10/05/2018  Interpretation Summary  Mitral valve: Trace regurgitation.  Right ventricle: Normal wall thickness and ejection fraction. Cavity is mildly dilated.  Tricuspid valve: Moderate regurgitation. The tricuspid valve regurgitation jet is central.  MONITORS  LONG TERM  MONITOR-LIVE TELEMETRY (3-14 DAYS)  12/30/2019  Narrative  Predominant underlying rhythm is sinus rhythm: Minimum heart rate 48 bpm, maximum 143 bpm. Average 76 bpm.  Rare (<1% ) isolated PACs with couplets and triplets noted. Rare isolated PVCs with couplets.  7 brief bursts of SVT/PAT: Fastest was 6 beats at a rate of 188 bpm, longest lasted 11 beats average rate 129 bpm.  No prolonged arrhythmias: No atrial fibrillation, flutter or true SVT, VT or VF.Marland Kitchen  No bradycardia arrhythmias other than sinus bradycardia. No pauses or heart block.  Relatively normal monitor.  No evidence of A. Fib.  Bryan Lemma, MD       ______________________________________________________________________________________________      Risk Assessment/Calculations:             Physical Exam:   VS:  BP 124/78   Pulse (!) 102   Ht 4\' 10"  (1.473 m)   Wt 134 lb 3.2 oz (60.9 kg)   SpO2 96%   BMI 28.05 kg/m    Wt Readings from Last 3 Encounters:  11/23/23 134 lb 3.2 oz (60.9 kg)  11/26/22 152 lb (68.9 kg)  11/21/22 152 lb 12.8 oz (69.3 kg)    GEN: Appears younger than stated age, well nourished, well developed in no acute distress NECK: No JVD; No carotid bruits CARDIAC: RRR, no murmurs, rubs, gallops RESPIRATORY:  Clear to auscultation without rales, wheezing or rhonchi  ABDOMEN: Soft, non-tender, non-distended EXTREMITIES:  No edema; No deformity   ASSESSMENT AND PLAN: .   CAD - s/p CABG x 2 2020 >> Lexiscan 2023 negative for ischemia.  She has had an episode of atypical chest pain as outlined above in the HPI, sounds to be more consistent with with musculoskeletal pain and was surrounding her moving some totes.  We did discuss we could arrange for an ischemic evaluation if it would put her mind at rest however she thinks for now she does not need this testing and I agree with her.  If she has further episodes advised her to contact our office.  Continue aspirin 81 mg daily, continue Crestor 20 mg daily, continue Imdur 30 mg  daily.  Fatigue-this has been going on for a few weeks.  We discussed checking lab work for anemia or any thyroid dysfunction however she would have had to have this checked at a different office as we did not have any laboratory services today and she ultimately declined.  Her daughter stated they would follow-up with her PCP to see if any blood work was warranted.  Hypertension-blood pressure is well-controlled 124/78, continue Zestril 10 mg daily.  Dyslipidemia-this appears to be monitored formally by her PCP, currently on Crestor 20 mg daily.       Dispo: Follow-up in 1 year.  Signed, Flossie Dibble, NP

## 2023-11-23 NOTE — Patient Instructions (Signed)
 Medication Instructions:  Your physician recommends that you continue on your current medications as directed. Please refer to the Current Medication list given to you today.   *If you need a refill on your cardiac medications before your next appointment, please call your pharmacy*   Lab Work: NONE If you have labs (blood work) drawn today and your tests are completely normal, you will receive your results only by: MyChart Message (if you have MyChart) OR A paper copy in the mail If you have any lab test that is abnormal or we need to change your treatment, we will call you to review the results.   Testing/Procedures: NONE   Follow-Up: At [facility], you and your health needs are our priority.  As part of our continuing mission to provide you with exceptional heart care, we have created designated Provider Care Teams.  These Care Teams include your primary Cardiologist (physician) and Advanced Practice Providers (APPs -  Physician Assistants and Nurse Practitioners) who all work together to provide you with the care you need, when you need it.  We recommend signing up for the patient portal called "MyChart".  Sign up information is provided on this After Visit Summary.  MyChart is used to connect with patients for Virtual Visits (Telemedicine).  Patients are able to view lab/test results, encounter notes, upcoming appointments, etc.  Non-urgent messages can be sent to your provider as well.   To learn more about what you can do with MyChart, go to [email or web address].    Your next appointment:   1 year(s)  Provider:   Gypsy Balsam, MD    Other Instructions

## 2023-12-01 DIAGNOSIS — M47812 Spondylosis without myelopathy or radiculopathy, cervical region: Secondary | ICD-10-CM | POA: Diagnosis not present

## 2023-12-01 DIAGNOSIS — M542 Cervicalgia: Secondary | ICD-10-CM | POA: Diagnosis not present

## 2023-12-03 DIAGNOSIS — L988 Other specified disorders of the skin and subcutaneous tissue: Secondary | ICD-10-CM | POA: Diagnosis not present

## 2023-12-03 DIAGNOSIS — M542 Cervicalgia: Secondary | ICD-10-CM | POA: Diagnosis not present

## 2023-12-03 DIAGNOSIS — R234 Changes in skin texture: Secondary | ICD-10-CM | POA: Diagnosis not present

## 2023-12-03 DIAGNOSIS — Z1321 Encounter for screening for nutritional disorder: Secondary | ICD-10-CM | POA: Diagnosis not present

## 2023-12-03 DIAGNOSIS — E559 Vitamin D deficiency, unspecified: Secondary | ICD-10-CM | POA: Diagnosis not present

## 2023-12-03 DIAGNOSIS — Z13228 Encounter for screening for other metabolic disorders: Secondary | ICD-10-CM | POA: Diagnosis not present

## 2023-12-03 DIAGNOSIS — I10 Essential (primary) hypertension: Secondary | ICD-10-CM | POA: Diagnosis not present

## 2024-01-25 ENCOUNTER — Inpatient Hospital Stay

## 2024-01-25 ENCOUNTER — Inpatient Hospital Stay: Admitting: Hematology and Oncology

## 2024-02-10 ENCOUNTER — Other Ambulatory Visit: Payer: Self-pay | Admitting: Hematology and Oncology

## 2024-02-10 DIAGNOSIS — D649 Anemia, unspecified: Secondary | ICD-10-CM

## 2024-02-11 ENCOUNTER — Inpatient Hospital Stay: Attending: Hematology and Oncology

## 2024-02-11 ENCOUNTER — Telehealth: Payer: Self-pay | Admitting: Hematology and Oncology

## 2024-02-11 ENCOUNTER — Other Ambulatory Visit: Payer: Self-pay

## 2024-02-11 ENCOUNTER — Other Ambulatory Visit: Payer: Self-pay | Admitting: Hematology and Oncology

## 2024-02-11 ENCOUNTER — Inpatient Hospital Stay: Admitting: Hematology and Oncology

## 2024-02-11 VITALS — BP 146/68 | HR 73 | Resp 16 | Ht 59.0 in | Wt 138.0 lb

## 2024-02-11 DIAGNOSIS — Z87891 Personal history of nicotine dependence: Secondary | ICD-10-CM | POA: Insufficient documentation

## 2024-02-11 DIAGNOSIS — D649 Anemia, unspecified: Secondary | ICD-10-CM | POA: Diagnosis present

## 2024-02-11 DIAGNOSIS — N189 Chronic kidney disease, unspecified: Secondary | ICD-10-CM | POA: Diagnosis not present

## 2024-02-11 DIAGNOSIS — E538 Deficiency of other specified B group vitamins: Secondary | ICD-10-CM | POA: Insufficient documentation

## 2024-02-11 DIAGNOSIS — R609 Edema, unspecified: Secondary | ICD-10-CM | POA: Diagnosis not present

## 2024-02-11 DIAGNOSIS — R519 Headache, unspecified: Secondary | ICD-10-CM

## 2024-02-11 DIAGNOSIS — R5382 Chronic fatigue, unspecified: Secondary | ICD-10-CM

## 2024-02-11 DIAGNOSIS — Z79899 Other long term (current) drug therapy: Secondary | ICD-10-CM | POA: Insufficient documentation

## 2024-02-11 LAB — CBC WITH DIFFERENTIAL (CANCER CENTER ONLY)
Abs Immature Granulocytes: 0.03 10*3/uL (ref 0.00–0.07)
Basophils Absolute: 0 10*3/uL (ref 0.0–0.1)
Basophils Relative: 0 %
Eosinophils Absolute: 0 10*3/uL (ref 0.0–0.5)
Eosinophils Relative: 0 %
HCT: 30.4 % — ABNORMAL LOW (ref 36.0–46.0)
Hemoglobin: 9.4 g/dL — ABNORMAL LOW (ref 12.0–15.0)
Immature Granulocytes: 0 %
Lymphocytes Relative: 20 %
Lymphs Abs: 1.4 10*3/uL (ref 0.7–4.0)
MCH: 31.5 pg (ref 26.0–34.0)
MCHC: 30.9 g/dL (ref 30.0–36.0)
MCV: 102 fL — ABNORMAL HIGH (ref 80.0–100.0)
Monocytes Absolute: 0.5 10*3/uL (ref 0.1–1.0)
Monocytes Relative: 7 %
Neutro Abs: 4.9 10*3/uL (ref 1.7–7.7)
Neutrophils Relative %: 73 %
Platelet Count: 175 10*3/uL (ref 150–400)
RBC: 2.98 MIL/uL — ABNORMAL LOW (ref 3.87–5.11)
RDW: 13.8 % (ref 11.5–15.5)
WBC Count: 6.9 10*3/uL (ref 4.0–10.5)
nRBC: 0 % (ref 0.0–0.2)

## 2024-02-11 LAB — CMP (CANCER CENTER ONLY)
ALT: 9 U/L (ref 0–44)
AST: 14 U/L — ABNORMAL LOW (ref 15–41)
Albumin: 3.9 g/dL (ref 3.5–5.0)
Alkaline Phosphatase: 41 U/L (ref 38–126)
Anion gap: 12 (ref 5–15)
BUN: 38 mg/dL — ABNORMAL HIGH (ref 8–23)
CO2: 20 mmol/L — ABNORMAL LOW (ref 22–32)
Calcium: 9.7 mg/dL (ref 8.9–10.3)
Chloride: 108 mmol/L (ref 98–111)
Creatinine: 1.23 mg/dL — ABNORMAL HIGH (ref 0.44–1.00)
GFR, Estimated: 42 mL/min — ABNORMAL LOW (ref >60)
Glucose, Bld: 93 mg/dL (ref 70–99)
Potassium: 4.4 mmol/L (ref 3.5–5.1)
Sodium: 140 mmol/L (ref 135–145)
Total Bilirubin: 0.4 mg/dL (ref 0.0–1.2)
Total Protein: 6 g/dL — ABNORMAL LOW (ref 6.5–8.1)

## 2024-02-11 LAB — IRON AND TIBC
Iron: 91 ug/dL (ref 28–170)
Saturation Ratios: 26 % (ref 10.4–31.8)
TIBC: 350 ug/dL (ref 250–450)
UIBC: 259 ug/dL

## 2024-02-11 LAB — VITAMIN B12: Vitamin B-12: 165 pg/mL — ABNORMAL LOW (ref 180–914)

## 2024-02-11 LAB — FERRITIN: Ferritin: 44 ng/mL (ref 11–307)

## 2024-02-11 LAB — FOLATE: Folate: 12 ng/mL (ref >5.9)

## 2024-02-11 NOTE — Progress Notes (Signed)
 [facility] [address] [place],  [place]  [address] [phone]  Clinic Day:  02/11/2024   Referring physician: Tetter, Devin B, NP  Patient Care Team: Patient Care Team: Teofilo Fellers, NP as PCP - General (Nurse Practitioner) Manfred Seed, MD as PCP - Cardiology (Cardiology) Regal, Angus Kenning, DPM as Consulting Physician (Podiatry) Maria Shiner Sherryll Donald, MD as Consulting Physician (Oncology) Arminda Berth, OD as Consulting Physician (Optometry)   REASON FOR CONSULTATION:  Anemia  HISTORY OF PRESENT ILLNESS:   Molly Chandler is a 89 y.o. female with a history of anemia who is referred in consultation by Devin Tetter, NP for assessment and management. She reports ongoing fatigue and new onset headaches. Most recent CBC reveals hemoglobin 9.8. She also has swelling in bilateral lower extremities with discoloration. She notes this is not new. She does follow with cardiology and has seen them recently. She has seen her urologist who reports her bladder is good. She has a nephrologist, but hasn't seen him in a while. Her PCP sent recent labs with request for follow up. Medical history consists of hyperlipidemia, Vitamin D  deficiency, headache, and GERD. Surgical history consists of CABG in 2020 for heart attack/ widow maker. Family history is significant for kidney disease and dementia. She is a former smoker who quit at age 66 and denies use of alcohol or drugs. She has 4 daughter and 1 son who has passed away. She lives with her oldest daughter and is mostly independent. 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.  She denies fever, chills, nausea or vomiting. She denies cough, chest pain or shortness of breath. She denies issue with bowel or bladder. Her appetite is good and weight is stable.   REVIEW OF SYSTEMS:   Review of Systems  Constitutional:  Positive for fatigue.  HENT:  Negative.     Eyes: Negative.   Respiratory: Negative.    Cardiovascular: Negative.   Gastrointestinal: Negative.   Endocrine: Negative.   Genitourinary: Negative.    Musculoskeletal: Negative.   Skin:        Complains of very thin skin that tears easily.   Neurological: Negative.   Hematological: Negative.   Psychiatric/Behavioral: Negative.       VITALS:   Blood pressure (!) 146/68, pulse 73, resp. rate 16, height 4\' 11"  (1.499 m), weight 138 lb (62.6 kg), SpO2 100%.  Wt Readings from Last 3 Encounters:  02/11/24 138 lb (62.6 kg)  11/23/23 134 lb 3.2 oz (60.9 kg)  11/26/22 152 lb (68.9 kg)    Body mass index is 27.87 kg/m.  Performance status (ECOG): 1 - Symptomatic but completely ambulatory  PHYSICAL EXAM:   Physical Exam Constitutional:      Appearance: Normal appearance. She is normal weight.  HENT:     Head: Normocephalic.     Mouth/Throat:     Mouth: Mucous membranes are moist.  Cardiovascular:     Rate and Rhythm: Normal rate and regular rhythm.     Pulses: Normal pulses.     Heart sounds: Normal heart sounds.  Pulmonary:     Effort: Pulmonary effort is normal.     Breath sounds: Normal breath sounds.  Abdominal:     General: Abdomen is flat.  Musculoskeletal:        General: Normal range of motion.     Cervical back: Normal range of motion.     Right  lower leg: Edema present.     Left lower leg: Edema present.  Skin:    General: Skin is warm and dry.     Comments: Discoloration of bilateral lower extremities.   Neurological:     General: No focal deficit present.     Mental Status: She is alert and oriented to person, place, and time. Mental status is at baseline.  Psychiatric:        Mood and Affect: Mood normal.        Behavior: Behavior normal.        Thought Content: Thought content normal.        Judgment: Judgment normal.      LABS:      Latest Ref Rng & Units 02/11/2024   10:53 AM 02/10/2023   12:19 PM 11/12/2020    2:17 PM  CBC  WBC 4.0 - 10.5  K/uL 6.9  7.6  6.2   Hemoglobin 12.0 - 15.0 g/dL 9.4  16.1  09.6   Hematocrit 36.0 - 46.0 % 30.4  34.0  34.3   Platelets 150 - 400 K/uL 175  286  204.0       Latest Ref Rng & Units 02/11/2024   10:53 AM 02/10/2023   12:19 PM 07/21/2022    1:54 PM  CMP  Glucose 70 - 99 mg/dL 93  045    BUN 8 - 23 mg/dL 38  33    Creatinine 4.09 - 1.00 mg/dL 8.11  9.14    Sodium 782 - 145 mmol/L 140  136    Potassium 3.5 - 5.1 mmol/L 4.4  5.1    Chloride 98 - 111 mmol/L 108  106    CO2 22 - 32 mmol/L 20  22    Calcium  8.9 - 10.3 mg/dL 9.7  9.4    Total Protein 6.5 - 8.1 g/dL 6.0   5.9   Total Bilirubin 0.0 - 1.2 mg/dL 0.4     Alkaline Phos 38 - 126 U/L 41     AST 15 - 41 U/L 14     ALT 0 - 44 U/L 9        No results found for: "CEA1", "CEA" / No results found for: "CEA1", "CEA" No results found for: "PSA1" No results found for: "NFA213" No results found for: "CAN125"  No results found for: "TOTALPROTELP", "ALBUMINELP", "A1GS", "A2GS", "BETS", "BETA2SER", "GAMS", "MSPIKE", "SPEI" Lab Results  Component Value Date   FERRITIN 173.1 11/15/2018   FERRITIN 67 01/18/2010   Lab Results  Component Value Date   LDH 155 10/21/2010   LDH 164 06/01/2008   LDH 140 12/23/2007    STUDIES:   No results found.    HISTORY:   Past Medical History:  Diagnosis Date   Abnormal findings on diagnostic imaging of other parts of digestive tract 11/04/2022   Cataract 12/25/2022   Coronary artery disease with hx of myocardial infarct w/o hx of CABG 10/05/2018   Presented as anterior STEMI --> Cath revealed 95% distal LM-ostial LAD thrombotic lesion -> initial EF 20 to 25% (LV Gram) significant reduced, IABP => to OR ~2 hours after stabilization (due to emergency OR team already working). -> s/p CABG x 2 (LIMA-LAD, SVG-OM) -> post MI echo EF 50 to 55%.   Degeneration of lumbar intervertebral disc 08/06/2015   Diaphragmatic hernia 11/04/2022   Dysphagia, unspecified 11/04/2022   GERD (gastroesophageal reflux  disease) 09/26/2011   History of ST elevation myocardial infarction (STEMI) 10/05/2018   Denies ever  having chest pain.  Had nausea vomiting diarrhea ongoing since March.  After significant diarrhea episode had near syncope followed by syncope --> EMS EKG showed anterior ST elevation   Hyperlipidemia with target LDL less than 70 05/31/2019   Leg swelling 11/16/2020   Morbid (severe) obesity due to excess calories (HCC) 11/04/2022   Osteoarthritis 02/05/2018   Osteoporosis 02/05/2018   Polyneuropathy 04/29/2017   S/P CABG x 2 10/05/2018   Slow transit constipation 11/04/2022   Vitamin D  deficiency     Past Surgical History:  Procedure Laterality Date   CORONARY ARTERY BYPASS GRAFT N/A 10/05/2018   Procedure: CORONARY ARTERY BYPASS GRAFTING (CABG)x 2, LIMA-LAD, (right leg GSV) rSVG-OM (SVG harvest endoscopically);  Surgeon: Bartley Lightning, MD;  Location: [facility] OR;  Service: Cardiothoracic;  Laterality: N/A;   CORONARY/GRAFT ACUTE MI REVASCULARIZATION N/A 10/05/2018   no PCI --> IABP --> EMERGENT CABG X 2   IABP INSERTION N/A 10/05/2018   Procedure: IABP Insertion;  Surgeon: Arleen Lacer, MD;  Location: [facility] INVASIVE CV LAB;  Service: Cardiovascular;  Laterality: N/A;   LEFT HEART CATH AND CORONARY ANGIOGRAPHY N/A 10/05/2018   Procedure: LEFT HEART CATH AND CORONARY ANGIOGRAPHY;  Surgeon: Arleen Lacer, MD;  Location: [facility] INVASIVE CV LAB; STEMI/SYNCOPE:  Cath -> dLM-OstLAD 95% (thrombotic), ost Cx 50%. Initial EF 25-35% - anterior HK.  2+ MR. Elevated LVEDP --> IABP -> CABG   NM MYOVIEW  LTD  11/2019   INTERMEDIATE RISK.  Normal EF 55 to 65%.  Medium size moderate severity fixed defect in the mid anterolateral and apical lateral wall consistent with prior INFARCT.  NO EVIDENCE OF ISCHEMIA   TRANSTHORACIC ECHOCARDIOGRAM  10/11/2018   Post CABG: EF 50-55%. Normal valves.  Abnormal Septal motion (normal post-op).  Normal atrial size.     Family History  Problem Relation Age of Onset   Kidney  disease Mother    Dementia Father     Social History:  reports that she has quit smoking. She has never used smokeless tobacco. She reports that she does not drink alcohol and does not use drugs.The patient is accompanied by daughter, Tia Flowers today.  Allergies:  Allergies  Allergen Reactions   Amoxicillin Diarrhea and Nausea And Vomiting    GI intolerance.    Metoclopramide Hcl Other (See Comments)    Stroke-like symptoms (dose too high?)   Atorvastatin  Other (See Comments)    Pain in extremities, memory issues   Metoclopramide     Other reaction(s): Unknown   Pregabalin     Current Medications: Current Outpatient Medications  Medication Sig Dispense Refill   acetaminophen  (TYLENOL ) 325 MG tablet Take 650 mg by mouth every 6 (six) hours as needed for mild pain or moderate pain.     Ascorbic Acid (VITAMIN C) 1000 MG tablet Take 1,000 mg by mouth daily.     cephALEXin  (KEFLEX ) 500 MG capsule Take 1 capsule (500 mg total) by mouth 4 (four) times daily. 28 capsule 0   cholecalciferol (VITAMIN D3) 25 MCG (1000 UNIT) tablet Take 1,000 Units by mouth daily.     cyanocobalamin  (VITAMIN B12) 1000 MCG tablet Take 1,000 mcg by mouth daily.     FARXIGA 5 MG TABS tablet Take 5 mg by mouth daily.     isosorbide  mononitrate (IMDUR ) 30 MG 24 hr tablet Take 1 tablet (30 mg total) by mouth daily. 90 tablet 3   latanoprost  (XALATAN ) 0.005 % ophthalmic solution Place 1 drop into both eyes daily.  lisinopril (ZESTRIL) 10 MG tablet Take 10 mg by mouth daily.     Multiple Vitamin (MULTIVITAMIN) tablet Take 1 tablet by mouth daily.     Omega-3 Fatty Acids (FISH OIL) 1000 MG CAPS Take 1,000 mg by mouth daily.     omeprazole  (PRILOSEC) 40 MG capsule TAKE 1 CAPSULE (40 MG TOTAL) BY MOUTH DAILY. 90 capsule 3   Probiotic Product (PROBIOTIC DAILY PO) Take 1 tablet by mouth daily.      rosuvastatin (CRESTOR) 20 MG tablet Take 20 mg by mouth daily.     Vitamin D , Ergocalciferol , (DRISDOL ) 1.25 MG (50000  UNIT) CAPS capsule Take 50,000 Units by mouth every 7 (seven) days.     trimethoprim  (TRIMPEX ) 100 MG tablet Take 100 mg by mouth daily.     No current facility-administered medications for this visit.     ASSESSMENT & PLAN:   Assessment:  Cris Gibby is a 89 y.o. female with history of anemia, ongoing fatigue and new onset headaches. She states the fatigue is present but doesn't necessarily affect her day to day living. She is quite active. She has bilateral lower extremity swelling and discoloration that she states has not changed for the last couple of years. She does follow with cardiology. She has chronic kidney disease which may be contributing to her anemia. She has not seen nephrology lately. I do see that her PCP has made that referral. We discussed that we are checking her iron studies today as well as Vitamin B12 and folate to determine the cause of her anemia. We discussed possible options including Vitamin B12 injections as today's CBC indicates a macrocytic anemia with hemoglobin 9.4 and MCV 102.   Plan: 1.  I will contact her daughter, Tia Flowers with the results of pending labs from today and discuss further treatment planning.   I discussed the assessment and treatment plan with the patient.  The patient was provided an opportunity to ask questions and all were answered.  The patient agreed with the plan and demonstrated an understanding of the instructions.    Thank you for the referral    45 minutes was spent in patient care.  This included time spent preparing to see the patient (e.g., review of tests), obtaining and/or reviewing separately obtained history, counseling and educating the patient/family/caregiver, ordering medications, tests, or procedures; documenting clinical information in the electronic or other health record, independently interpreting results and communicating results to the patient/family/caregiver as well as coordination of care.      Adelaide Adjutant, NP   Family Nurse Practitioner - Board Certified [facility] [place] [phone]

## 2024-02-11 NOTE — Telephone Encounter (Signed)
 Patient has been scheduled for follow-up visit per 02/11/24 LOS.  Pt noted appt details on personal electronic device.

## 2024-02-12 ENCOUNTER — Telehealth: Payer: Self-pay

## 2024-02-12 ENCOUNTER — Other Ambulatory Visit: Payer: Self-pay | Admitting: Hematology and Oncology

## 2024-02-12 DIAGNOSIS — E538 Deficiency of other specified B group vitamins: Secondary | ICD-10-CM

## 2024-02-12 MED ORDER — CYANOCOBALAMIN 1000 MCG/ML IJ SOLN: 1000.0000 ug | Freq: Once | INTRAMUSCULAR | 3 refills | Status: DC

## 2024-02-12 NOTE — Telephone Encounter (Signed)
 Pt called this morning, states her daughter will give her the injections.

## 2024-03-03 ENCOUNTER — Ambulatory Visit: Admitting: Hematology and Oncology

## 2024-03-03 ENCOUNTER — Other Ambulatory Visit

## 2024-03-08 ENCOUNTER — Telehealth: Payer: Self-pay

## 2024-03-08 NOTE — Telephone Encounter (Signed)
 Pt's daughter calling to ask how frequent is hr mom to get the B12 injections?   Did this get taken care of?

## 2024-03-10 ENCOUNTER — Telehealth: Payer: Self-pay

## 2024-03-10 ENCOUNTER — Other Ambulatory Visit: Payer: Self-pay | Admitting: Hematology and Oncology

## 2024-03-10 DIAGNOSIS — E538 Deficiency of other specified B group vitamins: Secondary | ICD-10-CM

## 2024-03-10 MED ORDER — CYANOCOBALAMIN 1000 MCG/ML IJ SOLN
1000.0000 ug | INTRAMUSCULAR | 0 refills | Status: DC
Start: 2024-03-10 — End: 2024-03-31

## 2024-03-10 NOTE — Telephone Encounter (Signed)
-----   Message from Eleanor DELENA Bach sent at 03/10/2024  2:53 PM EDT ----- Regarding: RE: B12 They can keep it. We'll see what she is and go from there. ----- Message ----- From: Sheron Falling, RN Sent: 03/10/2024   2:52 PM EDT To: Eleanor DELENA Bach, NP Subject: B12                                            [name] [place], daughter, B12 injection has not been given weekly, it has been given monthly.  The prescription is now correct.  Do they need to keep the  03/17/2024 appt or should they reschedule.

## 2024-03-10 NOTE — Telephone Encounter (Signed)
 Patient daughter [name] aware.Molly Chandler

## 2024-03-10 NOTE — Telephone Encounter (Signed)
-----   Message from Eleanor DELENA Bach sent at 03/10/2024  2:53 PM EDT ----- Regarding: RE: B12 They can keep it. We'll see what she is and go from there. ----- Message ----- From: Sheron Falling, RN Sent: 03/10/2024   2:52 PM EDT To: Eleanor DELENA Bach, NP Subject: B12                                            Molly Chandler, daughter, B12 injection has not been given weekly, it has been given monthly.  The prescription is now correct.  Do they need to keep the  03/17/2024 appt or should they reschedule.

## 2024-03-14 ENCOUNTER — Encounter: Payer: Self-pay | Admitting: Hematology and Oncology

## 2024-03-16 ENCOUNTER — Other Ambulatory Visit: Payer: Self-pay | Admitting: Hematology and Oncology

## 2024-03-16 DIAGNOSIS — E538 Deficiency of other specified B group vitamins: Secondary | ICD-10-CM

## 2024-03-17 ENCOUNTER — Inpatient Hospital Stay: Attending: Hematology and Oncology

## 2024-03-17 ENCOUNTER — Inpatient Hospital Stay: Admitting: Hematology and Oncology

## 2024-03-18 ENCOUNTER — Other Ambulatory Visit: Payer: Self-pay

## 2024-03-18 ENCOUNTER — Encounter (HOSPITAL_COMMUNITY): Payer: Self-pay | Admitting: *Deleted

## 2024-03-18 ENCOUNTER — Emergency Department (HOSPITAL_COMMUNITY)
Admission: EM | Admit: 2024-03-18 | Discharge: 2024-03-18 | Disposition: A | Discharge status: Home / Self Care | Attending: Emergency Medicine | Admitting: Emergency Medicine

## 2024-03-18 DIAGNOSIS — E871 Hypo-osmolality and hyponatremia: Secondary | ICD-10-CM | POA: Insufficient documentation

## 2024-03-18 DIAGNOSIS — N289 Disorder of kidney and ureter, unspecified: Secondary | ICD-10-CM | POA: Diagnosis not present

## 2024-03-18 DIAGNOSIS — R799 Abnormal finding of blood chemistry, unspecified: Secondary | ICD-10-CM | POA: Diagnosis present

## 2024-03-18 LAB — BASIC METABOLIC PANEL WITH GFR
Anion gap: 5 (ref 5–15)
BUN: 47 mg/dL — ABNORMAL HIGH (ref 8–23)
CO2: 21 mmol/L — ABNORMAL LOW (ref 22–32)
Calcium: 9.4 mg/dL (ref 8.9–10.3)
Chloride: 108 mmol/L (ref 98–111)
Creatinine, Ser: 1.82 mg/dL — ABNORMAL HIGH (ref 0.44–1.00)
GFR, Estimated: 27 mL/min — ABNORMAL LOW (ref >60)
Glucose, Bld: 112 mg/dL — ABNORMAL HIGH (ref 70–99)
Potassium: 4.6 mmol/L (ref 3.5–5.1)
Sodium: 134 mmol/L — ABNORMAL LOW (ref 135–145)

## 2024-03-18 LAB — CBC
HCT: 31.7 % — ABNORMAL LOW (ref 36.0–46.0)
Hemoglobin: 10.2 g/dL — ABNORMAL LOW (ref 12.0–15.0)
MCH: 32.5 pg (ref 26.0–34.0)
MCHC: 32.2 g/dL (ref 30.0–36.0)
MCV: 101 fL — ABNORMAL HIGH (ref 80.0–100.0)
Platelets: 188 K/uL (ref 150–400)
RBC: 3.14 MIL/uL — ABNORMAL LOW (ref 3.87–5.11)
RDW: 13.2 % (ref 11.5–15.5)
WBC: 5.7 K/uL (ref 4.0–10.5)
nRBC: 0 % (ref 0.0–0.2)

## 2024-03-18 LAB — TROPONIN I (HIGH SENSITIVITY): Troponin I (High Sensitivity): 6 ng/L (ref <18)

## 2024-03-18 MED ORDER — SODIUM CHLORIDE 0.9 % IV BOLUS
500.0000 mL | Freq: Once | INTRAVENOUS | Status: AC
  Administered 2024-03-18: 500 mL via INTRAVENOUS

## 2024-03-18 NOTE — ED Triage Notes (Signed)
 Pt to ED via POV c/o abnormal lab, reports had labs drawn yesterday at PCP and was notified that  K+=  5.6 . Pt voicing no complaints.

## 2024-03-18 NOTE — Discharge Instructions (Addendum)
 Drink lots of clear liquids. Your testing showed that your potassium was normal but your kidneys were a little dehydrated - this may be caused from dehydration but can be made worse with lisinopril which is one of your blood pressure medicine - lisinopril so stop taking this at this time.  Your family doctor can recheck your blood work within 1 week.  ER for severe worsening symptoms

## 2024-03-18 NOTE — ED Triage Notes (Signed)
 The pt had lab work  drawn yesterday  they were called today and were told that her pot was very high and she needed to come to the ed

## 2024-03-18 NOTE — ED Provider Notes (Signed)
 [place] EMERGENCY DEPARTMENT AT [facility] Provider Note   CSN: [id number] Arrival date & time: 03/18/24  1529     Patient presents with: Abnormal Lab   DOT Molly Chandler is a 89 y.o. female.    Abnormal Lab  This patient is an 89 year old female, she was seen at her family doctor's office yesterday and had blood work done, was told that her blood work was abnormal including an elevated potassium, it was reported to the patient that it was 5.6, she does not have these results with her.  She had no complaints and these were just regular yearly labs being drawn.  She is not weak has no headache no chest pain no shortness of breath no blurred vision no fatigue or weakness    Prior to Admission medications   Medication Sig Start Date End Date Taking? Authorizing Provider  acetaminophen  (TYLENOL ) 325 MG tablet Take 650 mg by mouth every 6 (six) hours as needed for mild pain or moderate pain.    [provider]  Ascorbic Acid (VITAMIN C) 1000 MG tablet Take 1,000 mg by mouth daily.    [provider]  cephALEXin  (KEFLEX ) 500 MG capsule Take 1 capsule (500 mg total) by mouth 4 (four) times daily. 02/10/23   Patsey Lot, MD  cholecalciferol (VITAMIN D3) 25 MCG (1000 UNIT) tablet Take 1,000 Units by mouth daily.    [provider]  cyanocobalamin  (VITAMIN B12) 1000 MCG/ML injection Inject 1 mL (1,000 mcg total) into the muscle every 7 (seven) days. 03/10/24   Parsons, Melissa A, NP  FARXIGA 5 MG TABS tablet Take 5 mg by mouth daily. 11/17/23   [provider]  isosorbide  mononitrate (IMDUR ) 30 MG 24 hr tablet Take 1 tablet (30 mg total) by mouth daily. 11/28/22   Krasowski, Robert J, MD  latanoprost  (XALATAN ) 0.005 % ophthalmic solution Place 1 drop into both eyes daily. 03/30/20   [provider]  lisinopril (ZESTRIL) 10 MG tablet Take 10 mg by mouth daily. 10/22/22   [provider]  Multiple Vitamin (MULTIVITAMIN) tablet Take 1  tablet by mouth daily.    [provider]  Omega-3 Fatty Acids (FISH OIL) 1000 MG CAPS Take 1,000 mg by mouth daily.    [provider]  omeprazole  (PRILOSEC) 40 MG capsule TAKE 1 CAPSULE (40 MG TOTAL) BY MOUTH DAILY. 05/29/23   Krasowski, Robert J, MD  Probiotic Product (PROBIOTIC DAILY PO) Take 1 tablet by mouth daily.     [provider]  rosuvastatin (CRESTOR) 20 MG tablet Take 20 mg by mouth daily. 10/23/22   [provider]  trimethoprim  (TRIMPEX ) 100 MG tablet Take 100 mg by mouth daily. 06/30/23   [provider]  Vitamin D , Ergocalciferol , (DRISDOL ) 1.25 MG (50000 UNIT) CAPS capsule Take 50,000 Units by mouth every 7 (seven) days.    [provider]    Allergies: Amoxicillin, Metoclopramide hcl, Atorvastatin , Metoclopramide, and Pregabalin    Review of Systems  All other systems reviewed and are negative.   Updated Vital Signs BP 129/63 (BP Location: Right Arm)   Pulse 74   Temp 98.7 F (37.1 C) (Oral)   Resp 17   Ht 1.499 m (4' 11)   Wt 62.6 kg   SpO2 98%   BMI 27.87 kg/m   Physical Exam Vitals and nursing note reviewed.  Constitutional:      General: She is not in acute distress.    Appearance: She is well-developed.  HENT:  Head: Normocephalic and atraumatic.     Mouth/Throat:     Pharynx: No oropharyngeal exudate.  Eyes:     General: No scleral icterus.       Right eye: No discharge.        Left eye: No discharge.     Conjunctiva/sclera: Conjunctivae normal.     Pupils: Pupils are equal, round, and reactive to light.  Neck:     Thyroid : No thyromegaly.     Vascular: No JVD.  Cardiovascular:     Rate and Rhythm: Normal rate and regular rhythm.     Heart sounds: Normal heart sounds. No murmur heard.    No friction rub. No gallop.  Pulmonary:     Effort: Pulmonary effort is normal. No respiratory distress.     Breath sounds: Normal breath sounds. No wheezing or rales.  Abdominal:     General: Bowel  sounds are normal. There is no distension.     Palpations: Abdomen is soft. There is no mass.     Tenderness: There is no abdominal tenderness.  Musculoskeletal:        General: No tenderness. Normal range of motion.     Cervical back: Normal range of motion and neck supple.     Right lower leg: No edema.     Left lower leg: No edema.  Lymphadenopathy:     Cervical: No cervical adenopathy.  Skin:    General: Skin is warm and dry.     Findings: No erythema or rash.  Neurological:     Mental Status: She is alert.     Coordination: Coordination normal.     Comments: The patient is awake alert and able to follow all my commands, she does not appear weak has no facial asymmetry and has normal speech  Psychiatric:        Behavior: Behavior normal.     (all labs ordered are listed, but only abnormal results are displayed) Labs Reviewed  BASIC METABOLIC PANEL WITH GFR - Abnormal; Notable for the following components:      Result Value   Sodium 134 (*)    CO2 21 (*)    Glucose, Bld 112 (*)    BUN 47 (*)    Creatinine, Ser 1.82 (*)    GFR, Estimated 27 (*)    All other components within normal limits  CBC - Abnormal; Notable for the following components:   RBC 3.14 (*)    Hemoglobin 10.2 (*)    HCT 31.7 (*)    MCV 101.0 (*)    All other components within normal limits  TROPONIN I (HIGH SENSITIVITY)    EKG: EKG Interpretation Date/Time:  Friday March 18 2024 15:49:21 EDT Ventricular Rate:  78 PR Interval:  186 QRS Duration:  82 QT Interval:  330 QTC Calculation: 376 R Axis:   44  Text Interpretation: Normal sinus rhythm Normal ECG When compared with ECG of 10-Feb-2023 11:56, PREVIOUS ECG IS PRESENT Confirmed by Cleotilde Rogue ([id number]) on 03/18/2024 4:38:15 PM  Radiology: No results found.   Procedures   Medications Ordered in the ED  sodium chloride  0.9 % bolus 500 mL (500 mLs Intravenous New Bag/Given 03/18/24 2057)                                    Medical  Decision Making   This patient presents to the ED for concern of elevated potassium reported on  prior labs differential diagnosis includes hemolysis, renal failure    Additional history obtained   Additional history obtained from Electronic Medical Record External records from outside source obtained and reviewed including medical record however there does not appear to be any labs from yesterday available   Lab Tests:  I Ordered, and personally interpreted labs.  The pertinent results include: Renal insufficiency but no hyperkalemia, most recent potassium was 4.6 measured here, creatinine was 1.8 with baseline of 1.2 from a month ago.    Medicines ordered and prescription drug management:  I ordered medication including IV fluids    I have reviewed the patients home medicines and have made adjustments as needed   Problem List / ED Course:  Patient hydrated, advised to stop lisinopril until she follows up with family doctor, agreeable   Social Determinants of Health:  None        Final diagnoses:  Renal insufficiency    ED Discharge Orders     None          Cleotilde Rogue, MD 03/18/24 2148

## 2024-03-31 ENCOUNTER — Other Ambulatory Visit: Payer: Self-pay | Admitting: Hematology and Oncology

## 2024-03-31 ENCOUNTER — Inpatient Hospital Stay: Attending: Hematology and Oncology

## 2024-03-31 ENCOUNTER — Other Ambulatory Visit: Payer: Self-pay

## 2024-03-31 ENCOUNTER — Telehealth: Payer: Self-pay | Admitting: Hematology and Oncology

## 2024-03-31 ENCOUNTER — Inpatient Hospital Stay: Admitting: Hematology and Oncology

## 2024-03-31 VITALS — HR 80 | Temp 98.2°F | Resp 16 | Ht 59.0 in | Wt 135.5 lb

## 2024-03-31 DIAGNOSIS — Z87891 Personal history of nicotine dependence: Secondary | ICD-10-CM | POA: Diagnosis not present

## 2024-03-31 DIAGNOSIS — E538 Deficiency of other specified B group vitamins: Secondary | ICD-10-CM

## 2024-03-31 DIAGNOSIS — D649 Anemia, unspecified: Secondary | ICD-10-CM | POA: Insufficient documentation

## 2024-03-31 DIAGNOSIS — K219 Gastro-esophageal reflux disease without esophagitis: Secondary | ICD-10-CM | POA: Insufficient documentation

## 2024-03-31 DIAGNOSIS — E559 Vitamin D deficiency, unspecified: Secondary | ICD-10-CM | POA: Diagnosis not present

## 2024-03-31 DIAGNOSIS — R5383 Other fatigue: Secondary | ICD-10-CM | POA: Insufficient documentation

## 2024-03-31 DIAGNOSIS — N189 Chronic kidney disease, unspecified: Secondary | ICD-10-CM | POA: Diagnosis not present

## 2024-03-31 DIAGNOSIS — Z79899 Other long term (current) drug therapy: Secondary | ICD-10-CM | POA: Diagnosis not present

## 2024-03-31 LAB — CMP (CANCER CENTER ONLY)
ALT: 8 U/L (ref 0–44)
AST: 18 U/L (ref 15–41)
Albumin: 3.5 g/dL (ref 3.5–5.0)
Alkaline Phosphatase: 45 U/L (ref 38–126)
Anion gap: 11 (ref 5–15)
BUN: 35 mg/dL — ABNORMAL HIGH (ref 8–23)
CO2: 22 mmol/L (ref 22–32)
Calcium: 10.1 mg/dL (ref 8.9–10.3)
Chloride: 103 mmol/L (ref 98–111)
Creatinine: 1.2 mg/dL — ABNORMAL HIGH (ref 0.44–1.00)
GFR, Estimated: 44 mL/min — ABNORMAL LOW (ref >60)
Glucose, Bld: 98 mg/dL (ref 70–99)
Potassium: 4.3 mmol/L (ref 3.5–5.1)
Sodium: 136 mmol/L (ref 135–145)
Total Bilirubin: 0.3 mg/dL (ref 0.0–1.2)
Total Protein: 6.1 g/dL — ABNORMAL LOW (ref 6.5–8.1)

## 2024-03-31 LAB — CBC WITH DIFFERENTIAL (CANCER CENTER ONLY)
Abs Immature Granulocytes: 0.03 K/uL (ref 0.00–0.07)
Basophils Absolute: 0 K/uL (ref 0.0–0.1)
Basophils Relative: 1 %
Eosinophils Absolute: 0.1 K/uL (ref 0.0–0.5)
Eosinophils Relative: 1 %
HCT: 30.2 % — ABNORMAL LOW (ref 36.0–46.0)
Hemoglobin: 9.7 g/dL — ABNORMAL LOW (ref 12.0–15.0)
Immature Granulocytes: 1 %
Lymphocytes Relative: 30 %
Lymphs Abs: 1.8 K/uL (ref 0.7–4.0)
MCH: 32 pg (ref 26.0–34.0)
MCHC: 32.1 g/dL (ref 30.0–36.0)
MCV: 99.7 fL (ref 80.0–100.0)
Monocytes Absolute: 0.5 K/uL (ref 0.1–1.0)
Monocytes Relative: 8 %
Neutro Abs: 3.5 K/uL (ref 1.7–7.7)
Neutrophils Relative %: 59 %
Platelet Count: 229 K/uL (ref 150–400)
RBC: 3.03 MIL/uL — ABNORMAL LOW (ref 3.87–5.11)
RDW: 13.2 % (ref 11.5–15.5)
WBC Count: 6 K/uL (ref 4.0–10.5)
nRBC: 0 % (ref 0.0–0.2)

## 2024-03-31 LAB — IRON AND TIBC
Iron: 78 ug/dL (ref 28–170)
Saturation Ratios: 21 % (ref 10.4–31.8)
TIBC: 368 ug/dL (ref 250–450)
UIBC: 290 ug/dL

## 2024-03-31 LAB — FOLATE: Folate: 12.6 ng/mL (ref >5.9)

## 2024-03-31 LAB — VITAMIN B12: Vitamin B-12: 1189 pg/mL — ABNORMAL HIGH (ref 180–914)

## 2024-03-31 LAB — FERRITIN: Ferritin: 32 ng/mL (ref 11–307)

## 2024-03-31 MED ORDER — CYANOCOBALAMIN 1000 MCG/ML IJ SOLN: 1000.0000 ug | INTRAMUSCULAR | 0 refills | Status: DC

## 2024-03-31 NOTE — Telephone Encounter (Signed)
 Patient has been scheduled for follow-up visit per 03/30/24 LOS.  Pt aware of scheduled appt details.

## 2024-03-31 NOTE — Progress Notes (Signed)
 [facility] [address] [place],  [place]  [address] [phone]  Clinic Day:  03/31/2024   Referring physician: Nestor Elston NOVAK, NP  Patient Care Team: Patient Care Team: Nestor Elston NOVAK, NP as PCP - General (Nurse Practitioner) Bernie Lamar PARAS, MD as PCP - Cardiology (Cardiology) Regal, Pasco RAMAN, DPM as Consulting Physician (Podiatry) Timmy Maude SAUNDERS, MD as Consulting Physician (Oncology) Portia Fireman, OD as Consulting Physician (Optometry)   REASON FOR CONSULTATION:  Anemia  HISTORY OF PRESENT ILLNESS:   Molly Chandler is a 89 y.o. female with a history of anemia who is referred in consultation by Devin Tetter, NP for assessment and management. She reports ongoing fatigue and new onset headaches. Most recent CBC reveals hemoglobin 9.8. She also has swelling in bilateral lower extremities with discoloration. She notes this is not new. She does follow with cardiology and has seen them recently. She has seen her urologist who reports her bladder is good. She has a nephrologist, but hasn't seen him in a while. Her PCP sent recent labs with request for follow up. Medical history consists of hyperlipidemia, Vitamin D  deficiency, headache, and GERD. Surgical history consists of CABG in 2020 for heart attack/ widow maker. Family history is significant for kidney disease and dementia. She is a former smoker who quit at age 70 and denies use of alcohol or drugs. She has 4 daughter and 1 son who has passed away. She lives with her oldest daughter and is mostly independent. 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.  She denies fever, chills, nausea or vomiting. She denies cough, chest pain or shortness of breath. She denies issue with bowel or bladder. Her appetite is good and weight is stable.   REVIEW OF SYSTEMS:   Review of Systems  Constitutional:  Positive for fatigue.  HENT:  Negative.     Eyes: Negative.   Respiratory: Negative.    Cardiovascular: Negative.   Gastrointestinal: Negative.   Endocrine: Negative.   Genitourinary: Negative.    Musculoskeletal: Negative.   Skin:        Complains of very thin skin that tears easily.   Neurological: Negative.   Hematological: Negative.   Psychiatric/Behavioral: Negative.       VITALS:   Pulse 80, temperature 98.2 F (36.8 C), temperature source Oral, resp. rate 16, height 4' 11 (1.499 m), weight 135 lb 8 oz (61.5 kg), SpO2 99%.  Wt Readings from Last 3 Encounters:  03/31/24 135 lb 8 oz (61.5 kg)  03/18/24 138 lb 0.1 oz (62.6 kg)  02/11/24 138 lb (62.6 kg)    Body mass index is 27.37 kg/m.  Performance status (ECOG): 1 - Symptomatic but completely ambulatory  PHYSICAL EXAM:   Physical Exam Constitutional:      Appearance: Normal appearance. She is normal weight.  HENT:     Head: Normocephalic.     Mouth/Throat:     Mouth: Mucous membranes are moist.  Cardiovascular:     Rate and Rhythm: Normal rate and regular rhythm.     Pulses: Normal pulses.     Heart sounds: Normal heart sounds.  Pulmonary:     Effort: Pulmonary effort is normal.     Breath sounds: Normal breath sounds.  Abdominal:     General: Abdomen is flat.  Musculoskeletal:        General: Normal range of motion.     Cervical back: Normal  range of motion.     Right lower leg: Edema present.     Left lower leg: Edema present.  Skin:    General: Skin is warm and dry.     Comments: Discoloration of bilateral lower extremities.   Neurological:     General: No focal deficit present.     Mental Status: She is alert and oriented to person, place, and time. Mental status is at baseline.  Psychiatric:        Mood and Affect: Mood normal.        Behavior: Behavior normal.        Thought Content: Thought content normal.        Judgment: Judgment normal.      LABS:      Latest Ref Rng & Units 03/31/2024   10:50 AM 03/18/2024    3:55 PM  02/11/2024   10:53 AM  CBC  WBC 4.0 - 10.5 K/uL 6.0  5.7  6.9   Hemoglobin 12.0 - 15.0 g/dL 9.7  89.7  9.4   Hematocrit 36.0 - 46.0 % 30.2  31.7  30.4   Platelets 150 - 400 K/uL 229  188  175       Latest Ref Rng & Units 03/18/2024    3:55 PM 02/11/2024   10:53 AM 02/10/2023   12:19 PM  CMP  Glucose 70 - 99 mg/dL 887  93  899   BUN 8 - 23 mg/dL 47  38  33   Creatinine 0.44 - 1.00 mg/dL 8.17  8.76  8.70   Sodium 135 - 145 mmol/L 134  140  136   Potassium 3.5 - 5.1 mmol/L 4.6  4.4  5.1   Chloride 98 - 111 mmol/L 108  108  106   CO2 22 - 32 mmol/L 21  20  22    Calcium  8.9 - 10.3 mg/dL 9.4  9.7  9.4   Total Protein 6.5 - 8.1 g/dL  6.0    Total Bilirubin 0.0 - 1.2 mg/dL  0.4    Alkaline Phos 38 - 126 U/L  41    AST 15 - 41 U/L  14    ALT 0 - 44 U/L  9       No results found for: CEA1, CEA / No results found for: CEA1, CEA No results found for: PSA1 No results found for: CAN199 No results found for: CAN125  No results found for: STEPHANY RINGS, A1GS, A2GS, EARLA BABCOCK, GAMS, MSPIKE, SPEI Lab Results  Component Value Date   TIBC 350 02/11/2024   FERRITIN 44 02/11/2024   FERRITIN 173.1 11/15/2018   FERRITIN 67 01/18/2010   IRONPCTSAT 26 02/11/2024   Lab Results  Component Value Date   LDH 155 10/21/2010   LDH 164 06/01/2008   LDH 140 12/23/2007    STUDIES:   No results found.    HISTORY:   Past Medical History:  Diagnosis Date   Abnormal findings on diagnostic imaging of other parts of digestive tract 11/04/2022   Cataract 12/25/2022   Coronary artery disease with hx of myocardial infarct w/o hx of CABG 10/05/2018   Presented as anterior STEMI --> Cath revealed 95% distal LM-ostial LAD thrombotic lesion -> initial EF 20 to 25% (LV Gram) significant reduced, IABP => to OR ~2 hours after stabilization (due to emergency OR team already working). -> s/p CABG x 2 (LIMA-LAD, SVG-OM) -> post MI echo EF 50 to 55%.   Degeneration of  lumbar intervertebral disc 08/06/2015  Diaphragmatic hernia 11/04/2022   Dysphagia, unspecified 11/04/2022   GERD (gastroesophageal reflux disease) 09/26/2011   History of ST elevation myocardial infarction (STEMI) 10/05/2018   Denies ever having chest pain.  Had nausea vomiting diarrhea ongoing since March.  After significant diarrhea episode had near syncope followed by syncope --> EMS EKG showed anterior ST elevation   Hyperlipidemia with target LDL less than 70 05/31/2019   Leg swelling 11/16/2020   Morbid (severe) obesity due to excess calories (HCC) 11/04/2022   Osteoarthritis 02/05/2018   Osteoporosis 02/05/2018   Polyneuropathy 04/29/2017   S/P CABG x 2 10/05/2018   Slow transit constipation 11/04/2022   Vitamin D  deficiency     Past Surgical History:  Procedure Laterality Date   CORONARY ARTERY BYPASS GRAFT N/A 10/05/2018   Procedure: CORONARY ARTERY BYPASS GRAFTING (CABG)x 2, LIMA-LAD, (right leg GSV) rSVG-OM (SVG harvest endoscopically);  Surgeon: Lucas Dorise POUR, MD;  Location: [facility] OR;  Service: Cardiothoracic;  Laterality: N/A;   CORONARY/GRAFT ACUTE MI REVASCULARIZATION N/A 10/05/2018   no PCI --> IABP --> EMERGENT CABG X 2   IABP INSERTION N/A 10/05/2018   Procedure: IABP Insertion;  Surgeon: Anner Alm ORN, MD;  Location: [facility] INVASIVE CV LAB;  Service: Cardiovascular;  Laterality: N/A;   LEFT HEART CATH AND CORONARY ANGIOGRAPHY N/A 10/05/2018   Procedure: LEFT HEART CATH AND CORONARY ANGIOGRAPHY;  Surgeon: Anner Alm ORN, MD;  Location: [facility] INVASIVE CV LAB; STEMI/SYNCOPE:  Cath -> dLM-OstLAD 95% (thrombotic), ost Cx 50%. Initial EF 25-35% - anterior HK.  2+ MR. Elevated LVEDP --> IABP -> CABG   NM MYOVIEW  LTD  11/2019   INTERMEDIATE RISK.  Normal EF 55 to 65%.  Medium size moderate severity fixed defect in the mid anterolateral and apical lateral wall consistent with prior INFARCT.  NO EVIDENCE OF ISCHEMIA   TRANSTHORACIC ECHOCARDIOGRAM  10/11/2018   Post CABG: EF 50-55%.  Normal valves.  Abnormal Septal motion (normal post-op).  Normal atrial size.     Family History  Problem Relation Age of Onset   Kidney disease Mother    Dementia Father     Social History:  reports that she has quit smoking. She has never used smokeless tobacco. She reports that she does not drink alcohol and does not use drugs.The patient is accompanied by daughter, [name] today.  Allergies:  Allergies  Allergen Reactions   Amoxicillin Diarrhea and Nausea And Vomiting    GI intolerance.    Metoclopramide Hcl Other (See Comments)    Stroke-like symptoms (dose too high?)   Atorvastatin  Other (See Comments)    Pain in extremities, memory issues   Metoclopramide     Other reaction(s): Unknown   Pregabalin     Current Medications: Current Outpatient Medications  Medication Sig Dispense Refill   acetaminophen  (TYLENOL ) 325 MG tablet Take 650 mg by mouth every 6 (six) hours as needed for mild pain or moderate pain.     Ascorbic Acid (VITAMIN C) 1000 MG tablet Take 1,000 mg by mouth daily.     cephALEXin  (KEFLEX ) 500 MG capsule Take 1 capsule (500 mg total) by mouth 4 (four) times daily. 28 capsule 0   cholecalciferol (VITAMIN D3) 25 MCG (1000 UNIT) tablet Take 1,000 Units by mouth daily.     cyanocobalamin  (VITAMIN B12) 1000 MCG/ML injection Inject 1 mL (1,000 mcg total) into the muscle every 7 (seven) days. 4 mL 0   FARXIGA 5 MG TABS tablet Take 5 mg by mouth daily.     isosorbide  mononitrate (  IMDUR ) 30 MG 24 hr tablet Take 1 tablet (30 mg total) by mouth daily. 90 tablet 3   latanoprost  (XALATAN ) 0.005 % ophthalmic solution Place 1 drop into both eyes daily.     Multiple Vitamin (MULTIVITAMIN) tablet Take 1 tablet by mouth daily.     Omega-3 Fatty Acids (FISH OIL) 1000 MG CAPS Take 1,000 mg by mouth daily.     omeprazole  (PRILOSEC) 40 MG capsule TAKE 1 CAPSULE (40 MG TOTAL) BY MOUTH DAILY. 90 capsule 3   Probiotic Product (PROBIOTIC DAILY PO) Take 1 tablet by mouth daily.       rosuvastatin (CRESTOR) 20 MG tablet Take 20 mg by mouth daily.     trimethoprim  (TRIMPEX ) 100 MG tablet Take 100 mg by mouth daily.     Vitamin D , Ergocalciferol , (DRISDOL ) 1.25 MG (50000 UNIT) CAPS capsule Take 50,000 Units by mouth every 7 (seven) days.     [Paused] lisinopril (ZESTRIL) 10 MG tablet Take 10 mg by mouth daily.     No current facility-administered medications for this visit.     ASSESSMENT & PLAN:   Assessment:  Nilani Hugill is a 89 y.o. female with history of anemia, ongoing fatigue and new onset headaches. She states the fatigue is present but doesn't necessarily affect her day to day living. She is quite active. She has bilateral lower extremity swelling and discoloration that she states has not changed for the last couple of years. She does follow with cardiology. She has chronic kidney disease which may be contributing to her anemia. She has not seen nephrology lately. I do see that her PCP has made that referral.She has been doing Vitamin B12 injections weekly x 3. There was confusion with the prescription and she gave her first dose and waited one month before the second injection. This was straightened out and she now is receiving injections weekly. She still remains tired, but mostly she has pain in her back and neck which is being managed by neurology. She has an appointment for follow up next month.   Plan: 1. Continue weekly B12 x 4 weeks. Return to clinic in 4 weeks for evaluation.   I discussed the assessment and treatment plan with the patient.  The patient was provided an opportunity to ask questions and all were answered.  The patient agreed with the plan and demonstrated an understanding of the instructions.    Thank you for the referral    20 minutes was spent in patient care.  This included time spent preparing to see the patient (e.g., review of tests), obtaining and/or reviewing separately obtained history, counseling and educating the  patient/family/caregiver, ordering medications, tests, or procedures; documenting clinical information in the electronic or other health record, independently interpreting results and communicating results to the patient/family/caregiver as well as coordination of care.      Eleanor DELENA Bach, NP   Family Nurse Practitioner - Board Certified [facility] [place] [phone]

## 2024-04-01 ENCOUNTER — Ambulatory Visit: Payer: Self-pay

## 2024-04-21 ENCOUNTER — Other Ambulatory Visit: Payer: Self-pay | Admitting: Hematology and Oncology

## 2024-04-21 DIAGNOSIS — E538 Deficiency of other specified B group vitamins: Secondary | ICD-10-CM

## 2024-04-28 ENCOUNTER — Other Ambulatory Visit: Payer: Self-pay

## 2024-04-28 ENCOUNTER — Telehealth: Payer: Self-pay | Admitting: Hematology and Oncology

## 2024-04-28 ENCOUNTER — Other Ambulatory Visit: Payer: Self-pay | Admitting: Hematology and Oncology

## 2024-04-28 ENCOUNTER — Inpatient Hospital Stay: Attending: Hematology and Oncology | Admitting: Hematology and Oncology

## 2024-04-28 ENCOUNTER — Inpatient Hospital Stay

## 2024-04-28 VITALS — HR 74 | Temp 98.4°F | Resp 18 | Ht 59.0 in

## 2024-04-28 DIAGNOSIS — Z79899 Other long term (current) drug therapy: Secondary | ICD-10-CM | POA: Insufficient documentation

## 2024-04-28 DIAGNOSIS — R5383 Other fatigue: Secondary | ICD-10-CM | POA: Insufficient documentation

## 2024-04-28 DIAGNOSIS — D649 Anemia, unspecified: Secondary | ICD-10-CM | POA: Insufficient documentation

## 2024-04-28 DIAGNOSIS — N189 Chronic kidney disease, unspecified: Secondary | ICD-10-CM | POA: Diagnosis not present

## 2024-04-28 DIAGNOSIS — Z87891 Personal history of nicotine dependence: Secondary | ICD-10-CM | POA: Diagnosis not present

## 2024-04-28 DIAGNOSIS — E538 Deficiency of other specified B group vitamins: Secondary | ICD-10-CM | POA: Diagnosis not present

## 2024-04-28 DIAGNOSIS — R519 Headache, unspecified: Secondary | ICD-10-CM | POA: Diagnosis not present

## 2024-04-28 LAB — CMP (CANCER CENTER ONLY)
ALT: 7 U/L (ref 0–44)
AST: 14 U/L — ABNORMAL LOW (ref 15–41)
Albumin: 3.8 g/dL (ref 3.5–5.0)
Alkaline Phosphatase: 46 U/L (ref 38–126)
Anion gap: 10 (ref 5–15)
BUN: 21 mg/dL (ref 8–23)
CO2: 25 mmol/L (ref 22–32)
Calcium: 10 mg/dL (ref 8.9–10.3)
Chloride: 105 mmol/L (ref 98–111)
Creatinine: 1.03 mg/dL — ABNORMAL HIGH (ref 0.44–1.00)
GFR, Estimated: 52 mL/min — ABNORMAL LOW (ref >60)
Glucose, Bld: 92 mg/dL (ref 70–99)
Potassium: 4.1 mmol/L (ref 3.5–5.1)
Sodium: 140 mmol/L (ref 135–145)
Total Bilirubin: 0.4 mg/dL (ref 0.0–1.2)
Total Protein: 6.4 g/dL — ABNORMAL LOW (ref 6.5–8.1)

## 2024-04-28 LAB — IRON AND TIBC
Iron: 49 ug/dL (ref 28–170)
Saturation Ratios: 13 % (ref 10.4–31.8)
TIBC: 385 ug/dL (ref 250–450)
UIBC: 336 ug/dL

## 2024-04-28 LAB — CBC WITH DIFFERENTIAL (CANCER CENTER ONLY)
Abs Immature Granulocytes: 0.03 K/uL (ref 0.00–0.07)
Basophils Absolute: 0.1 K/uL (ref 0.0–0.1)
Basophils Relative: 1 %
Eosinophils Absolute: 0.1 K/uL (ref 0.0–0.5)
Eosinophils Relative: 1 %
HCT: 32.7 % — ABNORMAL LOW (ref 36.0–46.0)
Hemoglobin: 10.1 g/dL — ABNORMAL LOW (ref 12.0–15.0)
Immature Granulocytes: 0 %
Lymphocytes Relative: 25 %
Lymphs Abs: 1.7 K/uL (ref 0.7–4.0)
MCH: 31.3 pg (ref 26.0–34.0)
MCHC: 30.9 g/dL (ref 30.0–36.0)
MCV: 101.2 fL — ABNORMAL HIGH (ref 80.0–100.0)
Monocytes Absolute: 0.5 K/uL (ref 0.1–1.0)
Monocytes Relative: 7 %
Neutro Abs: 4.4 K/uL (ref 1.7–7.7)
Neutrophils Relative %: 66 %
Platelet Count: 256 K/uL (ref 150–400)
RBC: 3.23 MIL/uL — ABNORMAL LOW (ref 3.87–5.11)
RDW: 13.1 % (ref 11.5–15.5)
WBC Count: 6.8 K/uL (ref 4.0–10.5)
nRBC: 0 % (ref 0.0–0.2)

## 2024-04-28 LAB — VITAMIN B12: Vitamin B-12: 1860 pg/mL — ABNORMAL HIGH (ref 180–914)

## 2024-04-28 LAB — FOLATE: Folate: 9.5 ng/mL (ref >5.9)

## 2024-04-28 LAB — FERRITIN: Ferritin: 25 ng/mL (ref 11–307)

## 2024-04-28 NOTE — Progress Notes (Signed)
 [name] [address] [place],  [place]  [address] [phone]  Clinic Day:  04/28/2024   Referring physician: Nestor Elston NOVAK, NP  Patient Care Team: Patient Care Team: Molly Elston NOVAK, NP as PCP - General (Nurse Practitioner) Molly Lamar PARAS, MD as PCP - Cardiology (Cardiology) Molly Chandler, DPM as Consulting Physician (Podiatry) Molly Maude SAUNDERS, MD as Consulting Physician (Oncology) Molly Chandler, OD as Consulting Physician (Optometry)   REASON FOR CONSULTATION:  Anemia  HISTORY OF PRESENT ILLNESS:   Molly Chandler is a 89 y.o. female with a history of anemia who is referred in consultation by Molly Tetter, NP for assessment and management. She reports ongoing fatigue and new onset headaches. Most recent CBC reveals hemoglobin 9.8. She also has swelling in bilateral lower extremities with discoloration. She notes this is not new. She does follow with cardiology and has seen them recently. She has seen her urologist who reports her bladder is good. She has a nephrologist, but hasn't seen him in a while. Her PCP sent recent labs with request for follow up. Medical history consists of hyperlipidemia, Vitamin D  deficiency, headache, and GERD. Surgical history consists of CABG in 2020 for heart attack/ widow maker. Family history is significant for kidney disease and dementia. She is a former smoker who quit at age 25 and denies use of alcohol or drugs. She has 4 daughter and 1 son who has passed away. She lives with her oldest daughter and is mostly independent. 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.  She denies fever, chills, nausea or vomiting. She denies cough, chest pain or shortness of breath. She denies issue with bowel or bladder. Her appetite is good and weight is stable.   REVIEW OF SYSTEMS:   Review of Systems  Constitutional:  Positive for fatigue.  HENT:  Negative.     Eyes: Negative.   Respiratory: Negative.    Cardiovascular: Negative.   Gastrointestinal: Negative.   Endocrine: Negative.   Genitourinary: Negative.    Musculoskeletal: Negative.   Skin:        Complains of very thin skin that tears easily.   Neurological: Negative.   Hematological: Negative.   Psychiatric/Behavioral: Negative.       VITALS:   Pulse 74, temperature 98.4 F (36.9 C), temperature source Oral, resp. rate 18, height 4' 11 (1.499 m), SpO2 98%.  Wt Readings from Last 3 Encounters:  03/31/24 135 lb 8 oz (61.5 kg)  03/18/24 138 lb 0.1 oz (62.6 kg)  02/11/24 138 lb (62.6 kg)    Body mass index is 27.37 kg/m.  Performance status (ECOG): 1 - Symptomatic but completely ambulatory  PHYSICAL EXAM:   Physical Exam Constitutional:      Appearance: Normal appearance. She is normal weight.  HENT:     Head: Normocephalic.     Mouth/Throat:     Mouth: Mucous membranes are moist.  Cardiovascular:     Rate and Rhythm: Normal rate and regular rhythm.     Pulses: Normal pulses.     Heart sounds: Normal heart sounds.  Pulmonary:     Effort: Pulmonary effort is normal.     Breath sounds: Normal breath sounds.  Abdominal:     General: Abdomen is flat.  Musculoskeletal:        General: Normal range of motion.     Cervical back: Normal range of motion.  Right lower leg: Edema present.     Left lower leg: Edema present.  Skin:    General: Skin is warm and dry.     Comments: Discoloration of bilateral lower extremities.   Neurological:     General: No focal deficit present.     Mental Status: She is alert and oriented to person, place, and time. Mental status is at baseline.  Psychiatric:        Mood and Affect: Mood normal.        Behavior: Behavior normal.        Thought Content: Thought content normal.        Judgment: Judgment normal.      LABS:      Latest Ref Rng & Units 04/28/2024   10:35 AM 03/31/2024   10:50 AM 03/18/2024    3:55 PM  CBC  WBC  4.0 - 10.5 K/uL 6.8  6.0  5.7   Hemoglobin 12.0 - 15.0 g/dL 89.8  9.7  89.7   Hematocrit 36.0 - 46.0 % 32.7  30.2  31.7   Platelets 150 - 400 K/uL 256  229  188       Latest Ref Rng & Units 04/28/2024   10:35 AM 03/31/2024   10:50 AM 03/18/2024    3:55 PM  CMP  Glucose 70 - 99 mg/dL 92  98  887   BUN 8 - 23 mg/dL 21  35  47   Creatinine 0.44 - 1.00 mg/dL 8.96  8.79  8.17   Sodium 135 - 145 mmol/L 140  136  134   Potassium 3.5 - 5.1 mmol/L 4.1  4.3  4.6   Chloride 98 - 111 mmol/L 105  103  108   CO2 22 - 32 mmol/L 25  22  21    Calcium  8.9 - 10.3 mg/dL 89.9  89.8  9.4   Total Protein 6.5 - 8.1 g/dL 6.4  6.1    Total Bilirubin 0.0 - 1.2 mg/dL 0.4  0.3    Alkaline Phos 38 - 126 U/L 46  45    AST 15 - 41 U/L 14  18    ALT 0 - 44 U/L 7  8       No results found for: CEA1, CEA / No results found for: CEA1, CEA No results found for: PSA1 No results found for: CAN199 No results found for: CAN125  No results found for: STEPHANY RINGS, A1GS, A2GS, EARLA JOANNIE KNIGHTS, MSPIKE, SPEI Lab Results  Component Value Date   TIBC 368 03/31/2024   TIBC 350 02/11/2024   FERRITIN 32 03/31/2024   FERRITIN 44 02/11/2024   FERRITIN 173.1 11/15/2018   IRONPCTSAT 21 03/31/2024   IRONPCTSAT 26 02/11/2024   Lab Results  Component Value Date   LDH 155 10/21/2010   LDH 164 06/01/2008   LDH 140 12/23/2007    STUDIES:   No results found.    HISTORY:   Past Medical History:  Diagnosis Date   Abnormal findings on diagnostic imaging of other parts of digestive tract 11/04/2022   Cataract 12/25/2022   Coronary artery disease with hx of myocardial infarct w/o hx of CABG 10/05/2018   Presented as anterior STEMI --> Cath revealed 95% distal LM-ostial LAD thrombotic lesion -> initial EF 20 to 25% (LV Gram) significant reduced, IABP => to OR ~2 hours after stabilization (due to emergency OR team already working). -> s/p CABG x 2 (LIMA-LAD, SVG-OM) -> post MI  echo EF 50 to 55%.  Degeneration of lumbar intervertebral disc 08/06/2015   Diaphragmatic hernia 11/04/2022   Dysphagia, unspecified 11/04/2022   GERD (gastroesophageal reflux disease) 09/26/2011   History of ST elevation myocardial infarction (STEMI) 10/05/2018   Denies ever having chest pain.  Had nausea vomiting diarrhea ongoing since March.  After significant diarrhea episode had near syncope followed by syncope --> EMS EKG showed anterior ST elevation   Hyperlipidemia with target LDL less than 70 05/31/2019   Leg swelling 11/16/2020   Morbid (severe) obesity due to excess calories (HCC) 11/04/2022   Osteoarthritis 02/05/2018   Osteoporosis 02/05/2018   Polyneuropathy 04/29/2017   S/P CABG x 2 10/05/2018   Slow transit constipation 11/04/2022   Vitamin D  deficiency     Past Surgical History:  Procedure Laterality Date   CORONARY ARTERY BYPASS GRAFT N/A 10/05/2018   Procedure: CORONARY ARTERY BYPASS GRAFTING (CABG)x 2, LIMA-LAD, (right leg GSV) rSVG-OM (SVG harvest endoscopically);  Surgeon: Lucas Dorise POUR, MD;  Location: [facility] OR;  Service: Cardiothoracic;  Laterality: N/A;   CORONARY/GRAFT ACUTE MI REVASCULARIZATION N/A 10/05/2018   no PCI --> IABP --> EMERGENT CABG X 2   IABP INSERTION N/A 10/05/2018   Procedure: IABP Insertion;  Surgeon: Anner Alm ORN, MD;  Location: [facility] INVASIVE CV LAB;  Service: Cardiovascular;  Laterality: N/A;   LEFT HEART CATH AND CORONARY ANGIOGRAPHY N/A 10/05/2018   Procedure: LEFT HEART CATH AND CORONARY ANGIOGRAPHY;  Surgeon: Anner Alm ORN, MD;  Location: [facility] INVASIVE CV LAB; STEMI/SYNCOPE:  Cath -> dLM-OstLAD 95% (thrombotic), ost Cx 50%. Initial EF 25-35% - anterior HK.  2+ MR. Elevated LVEDP --> IABP -> CABG   NM MYOVIEW  LTD  11/2019   INTERMEDIATE RISK.  Normal EF 55 to 65%.  Medium size moderate severity fixed defect in the mid anterolateral and apical lateral wall consistent with prior INFARCT.  NO EVIDENCE OF ISCHEMIA   TRANSTHORACIC ECHOCARDIOGRAM   10/11/2018   Post CABG: EF 50-55%. Normal valves.  Abnormal Septal motion (normal post-op).  Normal atrial size.     Family History  Problem Relation Age of Onset   Kidney disease Mother    Dementia Father     Social History:  reports that she has quit smoking. She has never used smokeless tobacco. She reports that she does not drink alcohol and does not use drugs.The patient is accompanied by daughter, Molly Chandler today.  Allergies:  Allergies  Allergen Reactions   Amoxicillin Diarrhea and Nausea And Vomiting    GI intolerance.    Metoclopramide Hcl Other (See Comments)    Stroke-like symptoms (dose too high?)   Atorvastatin  Other (See Comments)    Pain in extremities, memory issues   Metoclopramide     Other reaction(s): Unknown   Pregabalin     Current Medications: Current Outpatient Medications  Medication Sig Dispense Refill   acetaminophen  (TYLENOL ) 325 MG tablet Take 650 mg by mouth every 6 (six) hours as needed for mild pain or moderate pain.     Ascorbic Acid (VITAMIN C) 1000 MG tablet Take 1,000 mg by mouth daily.     cephALEXin  (KEFLEX ) 500 MG capsule Take 1 capsule (500 mg total) by mouth 4 (four) times daily. 28 capsule 0   cholecalciferol (VITAMIN D3) 25 MCG (1000 UNIT) tablet Take 1,000 Units by mouth daily.     cyanocobalamin  (VITAMIN B12) 1000 MCG/ML injection Inject 1 mL (1,000 mcg total) into the muscle every 30 (thirty) days. 3 mL 1   FARXIGA 5 MG TABS tablet Take 5 mg by  mouth daily.     isosorbide  mononitrate (IMDUR ) 30 MG 24 hr tablet Take 1 tablet (30 mg total) by mouth daily. 90 tablet 3   latanoprost  (XALATAN ) 0.005 % ophthalmic solution Place 1 drop into both eyes daily.     Multiple Vitamin (MULTIVITAMIN) tablet Take 1 tablet by mouth daily.     Omega-3 Fatty Acids (FISH OIL) 1000 MG CAPS Take 1,000 mg by mouth daily.     omeprazole  (PRILOSEC) 40 MG capsule TAKE 1 CAPSULE (40 MG TOTAL) BY MOUTH DAILY. 90 capsule 3   Probiotic Product (PROBIOTIC DAILY  PO) Take 1 tablet by mouth daily.      rosuvastatin (CRESTOR) 20 MG tablet Take 20 mg by mouth daily.     trimethoprim  (TRIMPEX ) 100 MG tablet Take 100 mg by mouth daily.     Vitamin D , Ergocalciferol , (DRISDOL ) 1.25 MG (50000 UNIT) CAPS capsule Take 50,000 Units by mouth every 7 (seven) days.     [Paused] lisinopril (ZESTRIL) 10 MG tablet Take 10 mg by mouth daily.     No current facility-administered medications for this visit.     ASSESSMENT & PLAN:   Assessment:  Molly Chandler is a 89 y.o. female with history of anemia, ongoing fatigue and new onset headaches. She states the fatigue is present but doesn't necessarily affect her day to day living. She is quite active. She has bilateral lower extremity swelling and discoloration that she states has not changed for the last couple of years. She does follow with cardiology. She has chronic kidney disease which may be contributing to her anemia. She has not seen nephrology lately. I do see that her PCP has made that referral.She has been doing Vitamin B12 injections weekly and now will begin monthly injections.   Plan: 1. Continue monthly B12. Return to clinic in 3 months for evaluation.   I discussed the assessment and treatment plan with the patient.  The patient was provided an opportunity to ask questions and all were answered.  The patient agreed with the plan and demonstrated an understanding of the instructions.    Thank you for the referral    20 minutes was spent in patient care.  This included time spent preparing to see the patient (e.g., review of tests), obtaining and/or reviewing separately obtained history, counseling and educating the patient/family/caregiver, ordering medications, tests, or procedures; documenting clinical information in the electronic or other health record, independently interpreting results and communicating results to the patient/family/caregiver as well as coordination of care.      Molly DELENA Bach, NP   Family Nurse Practitioner - Board Certified [facility] [phone]

## 2024-04-28 NOTE — Telephone Encounter (Signed)
 Patient has been scheduled for follow-up visit per 04/27/24 LOS.  Pt given an appt calendar with date and time.

## 2024-07-25 ENCOUNTER — Telehealth: Payer: Self-pay | Admitting: Hematology and Oncology

## 2024-07-25 NOTE — Telephone Encounter (Signed)
 07/25/24 Daughter called and cancelled appts.Will call back to reschedule.

## 2024-07-28 ENCOUNTER — Inpatient Hospital Stay: Admitting: Hematology and Oncology

## 2024-07-28 ENCOUNTER — Inpatient Hospital Stay

## 2024-08-25 ENCOUNTER — Other Ambulatory Visit: Payer: Self-pay

## 2024-08-25 ENCOUNTER — Other Ambulatory Visit: Payer: Self-pay | Admitting: Hematology and Oncology

## 2024-08-25 ENCOUNTER — Inpatient Hospital Stay: Admitting: Hematology and Oncology

## 2024-08-25 ENCOUNTER — Inpatient Hospital Stay: Attending: Hematology and Oncology

## 2024-08-25 ENCOUNTER — Telehealth: Payer: Self-pay | Admitting: Hematology and Oncology

## 2024-08-25 VITALS — HR 78 | Temp 98.4°F | Resp 16 | Ht 59.0 in | Wt 141.4 lb

## 2024-08-25 DIAGNOSIS — D649 Anemia, unspecified: Secondary | ICD-10-CM | POA: Diagnosis not present

## 2024-08-25 DIAGNOSIS — Z79899 Other long term (current) drug therapy: Secondary | ICD-10-CM | POA: Diagnosis not present

## 2024-08-25 DIAGNOSIS — N189 Chronic kidney disease, unspecified: Secondary | ICD-10-CM | POA: Insufficient documentation

## 2024-08-25 DIAGNOSIS — E538 Deficiency of other specified B group vitamins: Secondary | ICD-10-CM

## 2024-08-25 DIAGNOSIS — R5383 Other fatigue: Secondary | ICD-10-CM | POA: Insufficient documentation

## 2024-08-25 DIAGNOSIS — Z87891 Personal history of nicotine dependence: Secondary | ICD-10-CM | POA: Insufficient documentation

## 2024-08-25 DIAGNOSIS — R39198 Other difficulties with micturition: Secondary | ICD-10-CM | POA: Insufficient documentation

## 2024-08-25 LAB — URINALYSIS, COMPLETE (UACMP) WITH MICROSCOPIC
Glucose, UA: >=500 mg/dL — AB
Ketones, ur: NEGATIVE mg/dL
Specific Gravity, Urine: 1.025 (ref 1.005–1.030)
WBC, UA: >50 WBC/hpf (ref 0–5)
pH: 5 (ref 5.0–8.0)

## 2024-08-25 LAB — CBC WITH DIFFERENTIAL (CANCER CENTER ONLY)
Abs Immature Granulocytes: 0.04 K/uL (ref 0.00–0.07)
Basophils Absolute: 0.1 K/uL (ref 0.0–0.1)
Basophils Relative: 1 %
Eosinophils Absolute: 0.1 K/uL (ref 0.0–0.5)
Eosinophils Relative: 2 %
HCT: 33.1 % — ABNORMAL LOW (ref 36.0–46.0)
Hemoglobin: 10.6 g/dL — ABNORMAL LOW (ref 12.0–15.0)
Immature Granulocytes: 1 %
Lymphocytes Relative: 28 %
Lymphs Abs: 1.9 K/uL (ref 0.7–4.0)
MCH: 30.2 pg (ref 26.0–34.0)
MCHC: 32 g/dL (ref 30.0–36.0)
MCV: 94.3 fL (ref 80.0–100.0)
Monocytes Absolute: 0.5 K/uL (ref 0.1–1.0)
Monocytes Relative: 8 %
Neutro Abs: 4.1 K/uL (ref 1.7–7.7)
Neutrophils Relative %: 60 %
Platelet Count: 248 K/uL (ref 150–400)
RBC: 3.51 MIL/uL — ABNORMAL LOW (ref 3.87–5.11)
RDW: 14.4 % (ref 11.5–15.5)
WBC Count: 6.7 K/uL (ref 4.0–10.5)
nRBC: 0 % (ref 0.0–0.2)

## 2024-08-25 LAB — CMP (CANCER CENTER ONLY)
ALT: 10 U/L (ref 0–44)
AST: 24 U/L (ref 15–41)
Albumin: 4 g/dL (ref 3.5–5.0)
Alkaline Phosphatase: 55 U/L (ref 38–126)
Anion gap: 11 (ref 5–15)
BUN: 27 mg/dL — ABNORMAL HIGH (ref 8–23)
CO2: 25 mmol/L (ref 22–32)
Calcium: 9.8 mg/dL (ref 8.9–10.3)
Chloride: 103 mmol/L (ref 98–111)
Creatinine: 1.02 mg/dL — ABNORMAL HIGH (ref 0.44–1.00)
GFR, Estimated: 53 mL/min — ABNORMAL LOW (ref >60)
Glucose, Bld: 97 mg/dL (ref 70–99)
Potassium: 4 mmol/L (ref 3.5–5.1)
Sodium: 138 mmol/L (ref 135–145)
Total Bilirubin: 0.4 mg/dL (ref 0.0–1.2)
Total Protein: 6.5 g/dL (ref 6.5–8.1)

## 2024-08-25 LAB — IRON AND TIBC
Iron: 99 ug/dL (ref 28–170)
Saturation Ratios: 27 % (ref 10.4–31.8)
TIBC: 365 ug/dL (ref 250–450)
UIBC: 266 ug/dL

## 2024-08-25 LAB — FOLATE: Folate: 9.9 ng/mL (ref >5.9)

## 2024-08-25 LAB — VITAMIN B12: Vitamin B-12: 1054 pg/mL — ABNORMAL HIGH (ref 180–914)

## 2024-08-25 LAB — FERRITIN: Ferritin: 63 ng/mL (ref 11–307)

## 2024-08-25 LAB — TSH: TSH: 2.54 u[IU]/mL (ref 0.350–4.500)

## 2024-08-25 NOTE — Telephone Encounter (Signed)
 Patient has been scheduled for follow-up visit per 08/25/2024 LOS.  Pt given an appt calendar with date and time.

## 2024-08-25 NOTE — Progress Notes (Signed)
 [facility] [address] [place],  [place]  [address] [phone]  Clinic Day:  08/25/2024   Referring physician: Nestor Elston NOVAK, NP  Patient Care Team: Patient Care Team: Nestor Elston NOVAK, NP as PCP - General (Nurse Practitioner) Bernie Lamar PARAS, MD as PCP - Cardiology (Cardiology) Regal, [place] RAMAN, DPM as Consulting Physician (Podiatry) Timmy Maude SAUNDERS, MD as Consulting Physician (Oncology) Portia Fireman, OD as Consulting Physician (Optometry)   REASON FOR CONSULTATION:  Anemia  HISTORY OF PRESENT ILLNESS:   Molly Chandler is a 89 y.o. female with a history of anemia who is referred in consultation by Devin Tetter, NP for assessment and management. She reports ongoing fatigue and new onset headaches. Most recent CBC reveals hemoglobin 9.8. She also has swelling in bilateral lower extremities with discoloration. She notes this is not new. She does follow with cardiology and has seen them recently. She has seen her urologist who reports her bladder is good. She has a nephrologist, but hasn't seen him in a while. Her PCP sent recent labs with request for follow up. Medical history consists of hyperlipidemia, Vitamin D  deficiency, headache, and GERD. Surgical history consists of CABG in 2020 for heart attack/ widow maker. Family history is significant for kidney disease and dementia. She is a former smoker who quit at age 81 and denies use of alcohol or drugs. She has 4 daughter and 1 son who has passed away. She lives with her oldest daughter and is mostly independent. She has been active her whole life having worked at [employer], as a school bus hospital doctor and at [employer].  She denies fever, chills, nausea or vomiting. She denies cough, chest pain or shortness of breath. She denies issue with bowel or bladder. Her appetite is good and weight is stable.   REVIEW OF SYSTEMS:   Review of Systems  Constitutional:  Positive for fatigue.  HENT:  Negative.     Eyes: Negative.   Respiratory: Negative.    Cardiovascular: Negative.   Gastrointestinal: Negative.   Endocrine: Negative.   Genitourinary: Negative.    Musculoskeletal: Negative.   Skin:        Complains of very thin skin that tears easily.   Neurological: Negative.   Hematological: Negative.   Psychiatric/Behavioral: Negative.       VITALS:   There were no vitals taken for this visit.  Wt Readings from Last 3 Encounters:  03/31/24 135 lb 8 oz (61.5 kg)  03/18/24 138 lb 0.1 oz (62.6 kg)  02/11/24 138 lb (62.6 kg)    There is no height or weight on file to calculate BMI.  Performance status (ECOG): 1 - Symptomatic but completely ambulatory  PHYSICAL EXAM:   Physical Exam Constitutional:      Appearance: Normal appearance. She is normal weight.  HENT:     Head: Normocephalic.     Mouth/Throat:     Mouth: Mucous membranes are moist.  Cardiovascular:     Rate and Rhythm: Normal rate and regular rhythm.     Pulses: Normal pulses.     Heart sounds: Normal heart sounds.  Pulmonary:     Effort: Pulmonary effort is normal.     Breath sounds: Normal breath sounds.  Abdominal:     General: Abdomen is flat.  Musculoskeletal:        General: Normal range of motion.     Cervical back: Normal range of motion.     Right lower leg: Edema present.  Left lower leg: Edema present.  Skin:    General: Skin is warm and dry.     Comments: Discoloration of bilateral lower extremities.   Neurological:     General: No focal deficit present.     Mental Status: She is alert and oriented to person, place, and time. Mental status is at baseline.  Psychiatric:        Mood and Affect: Mood normal.        Behavior: Behavior normal.        Thought Content: Thought content normal.        Judgment: Judgment normal.      LABS:      Latest Ref Rng & Units 04/28/2024   10:35 AM 03/31/2024   10:50 AM 03/18/2024    3:55 PM  CBC  WBC 4.0 - 10.5 K/uL 6.8  6.0  5.7   Hemoglobin 12.0 -  15.0 g/dL 89.8  9.7  89.7   Hematocrit 36.0 - 46.0 % 32.7  30.2  31.7   Platelets 150 - 400 K/uL 256  229  188       Latest Ref Rng & Units 04/28/2024   10:35 AM 03/31/2024   10:50 AM 03/18/2024    3:55 PM  CMP  Glucose 70 - 99 mg/dL 92  98  887   BUN 8 - 23 mg/dL 21  35  47   Creatinine 0.44 - 1.00 mg/dL 8.96  8.79  8.17   Sodium 135 - 145 mmol/L 140  136  134   Potassium 3.5 - 5.1 mmol/L 4.1  4.3  4.6   Chloride 98 - 111 mmol/L 105  103  108   CO2 22 - 32 mmol/L 25  22  21    Calcium  8.9 - 10.3 mg/dL 89.9  89.8  9.4   Total Protein 6.5 - 8.1 g/dL 6.4  6.1    Total Bilirubin 0.0 - 1.2 mg/dL 0.4  0.3    Alkaline Phos 38 - 126 U/L 46  45    AST 15 - 41 U/L 14  18    ALT 0 - 44 U/L 7  8       No results found for: CEA1, CEA / No results found for: CEA1, CEA No results found for: PSA1 No results found for: CAN199 No results found for: CAN125  No results found for: STEPHANY CARLOTA BENSON MARKEL EARLA JOANNIE DOC, MSPIKE, SPEI Lab Results  Component Value Date   TIBC 385 04/28/2024   TIBC 368 03/31/2024   TIBC 350 02/11/2024   FERRITIN 25 04/28/2024   FERRITIN 32 03/31/2024   FERRITIN 44 02/11/2024   IRONPCTSAT 13 04/28/2024   IRONPCTSAT 21 03/31/2024   IRONPCTSAT 26 02/11/2024   Lab Results  Component Value Date   LDH 155 10/21/2010   LDH 164 06/01/2008   LDH 140 12/23/2007    STUDIES:   No results found.    HISTORY:   Past Medical History:  Diagnosis Date   Abnormal findings on diagnostic imaging of other parts of digestive tract 11/04/2022   Cataract 12/25/2022   Coronary artery disease with hx of myocardial infarct w/o hx of CABG 10/05/2018   Presented as anterior STEMI --> Cath revealed 95% distal LM-ostial LAD thrombotic lesion -> initial EF 20 to 25% (LV Gram) significant reduced, IABP => to OR ~2 hours after stabilization (due to emergency OR team already working). -> s/p CABG x 2 (LIMA-LAD, SVG-OM) -> post MI  echo EF 50 to 55%.  Degeneration of lumbar intervertebral disc 08/06/2015   Diaphragmatic hernia 11/04/2022   Dysphagia, unspecified 11/04/2022   GERD (gastroesophageal reflux disease) 09/26/2011   History of ST elevation myocardial infarction (STEMI) 10/05/2018   Denies ever having chest pain.  Had nausea vomiting diarrhea ongoing since March.  After significant diarrhea episode had near syncope followed by syncope --> EMS EKG showed anterior ST elevation   Hyperlipidemia with target LDL less than 70 05/31/2019   Leg swelling 11/16/2020   Morbid (severe) obesity due to excess calories (HCC) 11/04/2022   Osteoarthritis 02/05/2018   Osteoporosis 02/05/2018   Polyneuropathy 04/29/2017   S/P CABG x 2 10/05/2018   Slow transit constipation 11/04/2022   Vitamin D  deficiency     Past Surgical History:  Procedure Laterality Date   CORONARY ARTERY BYPASS GRAFT N/A 10/05/2018   Procedure: CORONARY ARTERY BYPASS GRAFTING (CABG)x 2, LIMA-LAD, (right leg GSV) rSVG-OM (SVG harvest endoscopically);  Surgeon: Lucas Dorise POUR, MD;  Location: [facility] OR;  Service: Cardiothoracic;  Laterality: N/A;   CORONARY/GRAFT ACUTE MI REVASCULARIZATION N/A 10/05/2018   no PCI --> IABP --> EMERGENT CABG X 2   IABP INSERTION N/A 10/05/2018   Procedure: IABP Insertion;  Surgeon: Anner Alm ORN, MD;  Location: [facility] INVASIVE CV LAB;  Service: Cardiovascular;  Laterality: N/A;   LEFT HEART CATH AND CORONARY ANGIOGRAPHY N/A 10/05/2018   Procedure: LEFT HEART CATH AND CORONARY ANGIOGRAPHY;  Surgeon: Anner Alm ORN, MD;  Location: [facility] INVASIVE CV LAB; STEMI/SYNCOPE:  Cath -> dLM-OstLAD 95% (thrombotic), ost Cx 50%. Initial EF 25-35% - anterior HK.  2+ MR. Elevated LVEDP --> IABP -> CABG   NM MYOVIEW  LTD  11/2019   INTERMEDIATE RISK.  Normal EF 55 to 65%.  Medium size moderate severity fixed defect in the mid anterolateral and apical lateral wall consistent with prior INFARCT.  NO EVIDENCE OF ISCHEMIA   TRANSTHORACIC ECHOCARDIOGRAM   10/11/2018   Post CABG: EF 50-55%. Normal valves.  Abnormal Septal motion (normal post-op).  Normal atrial size.     Family History  Problem Relation Age of Onset   Kidney disease Mother    Dementia Father     Social History:  reports that she has quit smoking. She has never used smokeless tobacco. She reports that she does not drink alcohol and does not use drugs.The patient is accompanied by daughter, [name] today.  Allergies:  Allergies  Allergen Reactions   Amoxicillin Diarrhea and Nausea And Vomiting    GI intolerance.    Metoclopramide Hcl Other (See Comments)    Stroke-like symptoms (dose too high?)   Atorvastatin  Other (See Comments)    Pain in extremities, memory issues   Metoclopramide     Other reaction(s): Unknown   Pregabalin     Current Medications: Current Outpatient Medications  Medication Sig Dispense Refill   acetaminophen  (TYLENOL ) 325 MG tablet Take 650 mg by mouth every 6 (six) hours as needed for mild pain or moderate pain.     Ascorbic Acid (VITAMIN C) 1000 MG tablet Take 1,000 mg by mouth daily.     cephALEXin  (KEFLEX ) 500 MG capsule Take 1 capsule (500 mg total) by mouth 4 (four) times daily. 28 capsule 0   cholecalciferol (VITAMIN D3) 25 MCG (1000 UNIT) tablet Take 1,000 Units by mouth daily.     cyanocobalamin  (VITAMIN B12) 1000 MCG/ML injection Inject 1 mL (1,000 mcg total) into the muscle every 30 (thirty) days. 3 mL 1   FARXIGA 5 MG TABS tablet Take 5 mg by  mouth daily.     isosorbide  mononitrate (IMDUR ) 30 MG 24 hr tablet Take 1 tablet (30 mg total) by mouth daily. 90 tablet 3   latanoprost  (XALATAN ) 0.005 % ophthalmic solution Place 1 drop into both eyes daily.     [Paused] lisinopril (ZESTRIL) 10 MG tablet Take 10 mg by mouth daily.     Multiple Vitamin (MULTIVITAMIN) tablet Take 1 tablet by mouth daily.     Omega-3 Fatty Acids (FISH OIL) 1000 MG CAPS Take 1,000 mg by mouth daily.     omeprazole  (PRILOSEC) 40 MG capsule TAKE 1 CAPSULE (40 MG  TOTAL) BY MOUTH DAILY. 90 capsule 3   Probiotic Product (PROBIOTIC DAILY PO) Take 1 tablet by mouth daily.      rosuvastatin (CRESTOR) 20 MG tablet Take 20 mg by mouth daily.     trimethoprim  (TRIMPEX ) 100 MG tablet Take 100 mg by mouth daily.     Vitamin D , Ergocalciferol , (DRISDOL ) 1.25 MG (50000 UNIT) CAPS capsule Take 50,000 Units by mouth every 7 (seven) days.     No current facility-administered medications for this visit.     ASSESSMENT & PLAN:   Assessment:  Molly Chandler is a 89 y.o. female with history of anemia, ongoing fatigue and new onset headaches. She states the fatigue is present and now is affecting her day to day living. She notes she is unable to complete daily tasks without taking multiple breaks. Hemoglobin is improved today at 10.6.  She has bilateral lower extremity swelling and discoloration that she states has not changed for the last couple of years. She does follow with cardiology. She has chronic kidney disease which may be contributing to her anemia. She has not seen nephrology lately. I do see that her PCP has made that referral.She has been doing Vitamin B12 injections weekly and now will begin monthly injections. She complains of burning with urination, so I will obtain a urinalysis today with culture and prescribe antibiotics if indicated.   Plan: 1. Continue monthly B12. Return to clinic in 3 months for evaluation.   I discussed the assessment and treatment plan with the patient.  The patient was provided an opportunity to ask questions and all were answered.  The patient agreed with the plan and demonstrated an understanding of the instructions.    Thank you for the referral    20 minutes was spent in patient care.  This included time spent preparing to see the patient (e.g., review of tests), obtaining and/or reviewing separately obtained history, counseling and educating the patient/family/caregiver, ordering medications, tests, or procedures;  documenting clinical information in the electronic or other health record, independently interpreting results and communicating results to the patient/family/caregiver as well as coordination of care.      Eleanor DELENA Bach, NP   Family Nurse Practitioner - Board Certified [facility] [place] [phone]

## 2024-08-27 LAB — URINE CULTURE: Culture: >=100000 — AB

## 2024-09-13 ENCOUNTER — Encounter: Payer: Self-pay | Admitting: Hematology and Oncology

## 2024-09-14 ENCOUNTER — Other Ambulatory Visit: Payer: Self-pay | Admitting: Hematology and Oncology

## 2024-12-15 ENCOUNTER — Inpatient Hospital Stay

## 2024-12-15 ENCOUNTER — Inpatient Hospital Stay: Admitting: Hematology and Oncology
